# Patient Record
Sex: Female | Born: 1940 | Race: White | Hispanic: No | Smoking: Current every day smoker
Health system: Southern US, Community
[De-identification: ages and names within clinical notes are randomized; demographics above are authoritative.]

## PROBLEM LIST (undated history)

## (undated) DIAGNOSIS — M797 Fibromyalgia: Secondary | ICD-10-CM

## (undated) DIAGNOSIS — Z720 Tobacco use: Secondary | ICD-10-CM

## (undated) DIAGNOSIS — IMO0002 Reserved for concepts with insufficient information to code with codable children: Secondary | ICD-10-CM

## (undated) DIAGNOSIS — G629 Polyneuropathy, unspecified: Secondary | ICD-10-CM

## (undated) DIAGNOSIS — M329 Systemic lupus erythematosus, unspecified: Secondary | ICD-10-CM

## (undated) DIAGNOSIS — D696 Thrombocytopenia, unspecified: Secondary | ICD-10-CM

## (undated) DIAGNOSIS — Z66 Do not resuscitate: Secondary | ICD-10-CM

## (undated) DIAGNOSIS — C3490 Malignant neoplasm of unspecified part of unspecified bronchus or lung: Secondary | ICD-10-CM

## (undated) DIAGNOSIS — Z515 Encounter for palliative care: Secondary | ICD-10-CM

## (undated) DIAGNOSIS — M069 Rheumatoid arthritis, unspecified: Secondary | ICD-10-CM

## (undated) HISTORY — PX: TRIGGER FINGER RELEASE: SHX641

## (undated) HISTORY — PX: APPENDECTOMY: SHX54

## (undated) HISTORY — PX: ABDOMINAL HYSTERECTOMY: SHX81

## (undated) HISTORY — PX: KNEE ARTHROSCOPY: SUR90

## (undated) HISTORY — PX: OOPHORECTOMY: SHX86

## (undated) HISTORY — PX: OTHER SURGICAL HISTORY: SHX169

---

## 2000-05-08 ENCOUNTER — Other Ambulatory Visit: Admission: RE | Admit: 2000-05-08 | Discharge: 2000-05-08 | Payer: Self-pay | Admitting: Urology

## 2000-05-12 ENCOUNTER — Encounter: Payer: Self-pay | Admitting: Urology

## 2000-05-12 ENCOUNTER — Ambulatory Visit (HOSPITAL_COMMUNITY): Admission: RE | Admit: 2000-05-12 | Discharge: 2000-05-12 | Payer: Self-pay | Admitting: Urology

## 2000-07-19 ENCOUNTER — Inpatient Hospital Stay (HOSPITAL_COMMUNITY): Admission: RE | Admit: 2000-07-19 | Discharge: 2000-07-22 | Payer: Self-pay | Admitting: Obstetrics and Gynecology

## 2000-11-23 ENCOUNTER — Encounter (HOSPITAL_COMMUNITY): Admission: RE | Admit: 2000-11-23 | Discharge: 2000-12-23 | Payer: Self-pay | Admitting: Rheumatology

## 2000-11-23 ENCOUNTER — Encounter: Payer: Self-pay | Admitting: Rheumatology

## 2001-01-15 ENCOUNTER — Encounter (HOSPITAL_COMMUNITY): Admission: RE | Admit: 2001-01-15 | Discharge: 2001-02-14 | Payer: Self-pay | Admitting: Rheumatology

## 2001-01-15 ENCOUNTER — Encounter: Payer: Self-pay | Admitting: Rheumatology

## 2001-03-29 ENCOUNTER — Encounter: Payer: Self-pay | Admitting: Rheumatology

## 2001-03-29 ENCOUNTER — Encounter (HOSPITAL_COMMUNITY): Admission: RE | Admit: 2001-03-29 | Discharge: 2001-04-28 | Payer: Self-pay | Admitting: Rheumatology

## 2001-05-24 ENCOUNTER — Encounter (HOSPITAL_COMMUNITY): Admission: RE | Admit: 2001-05-24 | Discharge: 2001-06-23 | Payer: Self-pay | Admitting: Oncology

## 2001-05-31 ENCOUNTER — Encounter: Payer: Self-pay | Admitting: Rheumatology

## 2001-07-19 ENCOUNTER — Encounter (HOSPITAL_COMMUNITY): Admission: RE | Admit: 2001-07-19 | Discharge: 2001-08-18 | Payer: Self-pay | Admitting: Rheumatology

## 2001-08-07 ENCOUNTER — Ambulatory Visit (HOSPITAL_COMMUNITY): Admission: RE | Admit: 2001-08-07 | Discharge: 2001-08-07 | Payer: Self-pay | Admitting: Family Medicine

## 2001-08-07 ENCOUNTER — Encounter: Payer: Self-pay | Admitting: Family Medicine

## 2002-06-20 ENCOUNTER — Encounter: Payer: Self-pay | Admitting: Internal Medicine

## 2002-06-20 ENCOUNTER — Ambulatory Visit (HOSPITAL_COMMUNITY): Admission: RE | Admit: 2002-06-20 | Discharge: 2002-06-20 | Payer: Self-pay | Admitting: Internal Medicine

## 2003-06-23 ENCOUNTER — Ambulatory Visit (HOSPITAL_COMMUNITY): Admission: RE | Admit: 2003-06-23 | Discharge: 2003-06-23 | Payer: Self-pay | Admitting: Internal Medicine

## 2004-12-08 ENCOUNTER — Encounter (HOSPITAL_COMMUNITY): Admission: RE | Admit: 2004-12-08 | Discharge: 2005-01-07 | Payer: Self-pay | Admitting: Orthopedic Surgery

## 2005-03-04 ENCOUNTER — Ambulatory Visit (HOSPITAL_COMMUNITY): Admission: RE | Admit: 2005-03-04 | Discharge: 2005-03-04 | Payer: Self-pay | Admitting: Rheumatology

## 2006-02-22 ENCOUNTER — Ambulatory Visit: Payer: Self-pay | Admitting: Internal Medicine

## 2006-02-22 ENCOUNTER — Ambulatory Visit (HOSPITAL_COMMUNITY): Admission: RE | Admit: 2006-02-22 | Discharge: 2006-02-22 | Payer: Self-pay | Admitting: Internal Medicine

## 2006-06-23 ENCOUNTER — Encounter (HOSPITAL_COMMUNITY): Admission: RE | Admit: 2006-06-23 | Discharge: 2006-07-23 | Payer: Self-pay | Admitting: Rheumatology

## 2006-06-23 ENCOUNTER — Ambulatory Visit (HOSPITAL_COMMUNITY): Payer: Self-pay | Admitting: Rheumatology

## 2006-08-04 ENCOUNTER — Encounter (HOSPITAL_COMMUNITY): Admission: RE | Admit: 2006-08-04 | Discharge: 2006-09-03 | Payer: Self-pay | Admitting: Rheumatology

## 2006-10-05 ENCOUNTER — Ambulatory Visit (HOSPITAL_COMMUNITY): Payer: Self-pay | Admitting: Rheumatology

## 2006-10-05 ENCOUNTER — Encounter (HOSPITAL_COMMUNITY): Admission: RE | Admit: 2006-10-05 | Discharge: 2006-10-17 | Payer: Self-pay | Admitting: Oncology

## 2006-11-01 ENCOUNTER — Ambulatory Visit (HOSPITAL_COMMUNITY): Admission: RE | Admit: 2006-11-01 | Discharge: 2006-11-01 | Payer: Self-pay | Admitting: Internal Medicine

## 2006-11-03 ENCOUNTER — Ambulatory Visit (HOSPITAL_COMMUNITY): Admission: RE | Admit: 2006-11-03 | Discharge: 2006-11-03 | Payer: Self-pay | Admitting: Internal Medicine

## 2006-11-06 ENCOUNTER — Ambulatory Visit (HOSPITAL_COMMUNITY): Admission: RE | Admit: 2006-11-06 | Discharge: 2006-11-06 | Payer: Self-pay | Admitting: Internal Medicine

## 2006-11-30 ENCOUNTER — Ambulatory Visit (HOSPITAL_COMMUNITY): Payer: Self-pay | Admitting: Rheumatology

## 2006-11-30 ENCOUNTER — Encounter (HOSPITAL_COMMUNITY): Admission: RE | Admit: 2006-11-30 | Discharge: 2006-12-30 | Payer: Self-pay | Admitting: Rheumatology

## 2007-02-01 ENCOUNTER — Encounter (HOSPITAL_COMMUNITY): Admission: RE | Admit: 2007-02-01 | Discharge: 2007-03-03 | Payer: Self-pay | Admitting: Rheumatology

## 2007-04-05 ENCOUNTER — Ambulatory Visit (HOSPITAL_COMMUNITY): Payer: Self-pay | Admitting: Rheumatology

## 2007-04-05 ENCOUNTER — Encounter (HOSPITAL_COMMUNITY): Admission: RE | Admit: 2007-04-05 | Discharge: 2007-05-05 | Payer: Self-pay | Admitting: Oncology

## 2007-06-07 ENCOUNTER — Encounter (HOSPITAL_COMMUNITY): Admission: RE | Admit: 2007-06-07 | Discharge: 2007-07-07 | Payer: Self-pay | Admitting: Rheumatology

## 2007-06-08 ENCOUNTER — Ambulatory Visit (HOSPITAL_COMMUNITY): Payer: Self-pay | Admitting: Rheumatology

## 2007-06-11 ENCOUNTER — Ambulatory Visit (HOSPITAL_COMMUNITY): Admission: RE | Admit: 2007-06-11 | Discharge: 2007-06-11 | Payer: Self-pay | Admitting: Internal Medicine

## 2007-08-03 ENCOUNTER — Encounter (HOSPITAL_COMMUNITY): Admission: RE | Admit: 2007-08-03 | Discharge: 2007-09-02 | Payer: Self-pay | Admitting: Rheumatology

## 2007-09-27 ENCOUNTER — Ambulatory Visit (HOSPITAL_COMMUNITY): Payer: Self-pay | Admitting: Rheumatology

## 2007-09-27 ENCOUNTER — Encounter (HOSPITAL_COMMUNITY): Admission: RE | Admit: 2007-09-27 | Discharge: 2007-10-11 | Payer: Self-pay | Admitting: Rheumatology

## 2007-10-12 ENCOUNTER — Emergency Department (HOSPITAL_COMMUNITY): Admission: EM | Admit: 2007-10-12 | Discharge: 2007-10-12 | Payer: Self-pay | Admitting: Dermatopathology

## 2007-10-17 ENCOUNTER — Observation Stay (HOSPITAL_COMMUNITY): Admission: EM | Admit: 2007-10-17 | Discharge: 2007-10-18 | Payer: Self-pay | Admitting: Emergency Medicine

## 2007-11-22 ENCOUNTER — Encounter (HOSPITAL_COMMUNITY): Admission: RE | Admit: 2007-11-22 | Discharge: 2007-12-22 | Payer: Self-pay | Admitting: Rheumatology

## 2007-11-22 ENCOUNTER — Ambulatory Visit (HOSPITAL_COMMUNITY): Payer: Self-pay | Admitting: Rheumatology

## 2008-01-18 HISTORY — PX: CATARACT EXTRACTION W/ INTRAOCULAR LENS IMPLANT: SHX1309

## 2008-01-30 ENCOUNTER — Ambulatory Visit (HOSPITAL_COMMUNITY): Payer: Self-pay | Admitting: Rheumatology

## 2008-01-30 ENCOUNTER — Encounter (HOSPITAL_COMMUNITY): Admission: RE | Admit: 2008-01-30 | Discharge: 2008-02-29 | Payer: Self-pay | Admitting: Oncology

## 2008-03-14 ENCOUNTER — Ambulatory Visit (HOSPITAL_COMMUNITY): Admission: RE | Admit: 2008-03-14 | Discharge: 2008-03-14 | Payer: Self-pay | Admitting: Family Medicine

## 2008-04-04 ENCOUNTER — Ambulatory Visit (HOSPITAL_COMMUNITY): Payer: Self-pay | Admitting: Rheumatology

## 2008-04-04 ENCOUNTER — Encounter (HOSPITAL_COMMUNITY): Admission: RE | Admit: 2008-04-04 | Discharge: 2008-05-04 | Payer: Self-pay | Admitting: Rheumatology

## 2008-05-30 ENCOUNTER — Ambulatory Visit (HOSPITAL_COMMUNITY): Payer: Self-pay | Admitting: Rheumatology

## 2008-05-30 ENCOUNTER — Encounter (HOSPITAL_COMMUNITY): Admission: RE | Admit: 2008-05-30 | Discharge: 2008-06-29 | Payer: Self-pay | Admitting: Rheumatology

## 2008-07-11 ENCOUNTER — Encounter (HOSPITAL_COMMUNITY): Admission: RE | Admit: 2008-07-11 | Discharge: 2008-08-10 | Payer: Self-pay | Admitting: Rheumatology

## 2008-08-27 ENCOUNTER — Ambulatory Visit (HOSPITAL_COMMUNITY): Admission: RE | Admit: 2008-08-27 | Discharge: 2008-08-27 | Payer: Self-pay | Admitting: Family Medicine

## 2008-09-15 ENCOUNTER — Emergency Department (HOSPITAL_COMMUNITY): Admission: EM | Admit: 2008-09-15 | Discharge: 2008-09-15 | Payer: Self-pay | Admitting: Emergency Medicine

## 2009-02-15 ENCOUNTER — Encounter (HOSPITAL_COMMUNITY)
Admission: RE | Admit: 2009-02-15 | Discharge: 2009-12-18 | Payer: Self-pay | Source: Home / Self Care | Admitting: Rheumatology

## 2009-03-27 ENCOUNTER — Ambulatory Visit (HOSPITAL_COMMUNITY)
Admission: RE | Admit: 2009-03-27 | Discharge: 2009-03-27 | Payer: Self-pay | Source: Home / Self Care | Admitting: Internal Medicine

## 2009-06-09 ENCOUNTER — Ambulatory Visit (HOSPITAL_COMMUNITY): Payer: Self-pay | Admitting: Rheumatology

## 2009-06-09 ENCOUNTER — Encounter (HOSPITAL_COMMUNITY): Admission: RE | Admit: 2009-06-09 | Discharge: 2009-07-09 | Payer: Self-pay | Admitting: Rheumatology

## 2009-09-02 ENCOUNTER — Encounter (HOSPITAL_COMMUNITY)
Admission: RE | Admit: 2009-09-02 | Discharge: 2009-10-02 | Payer: Self-pay | Source: Home / Self Care | Admitting: Rheumatology

## 2009-09-02 ENCOUNTER — Ambulatory Visit (HOSPITAL_COMMUNITY): Payer: Self-pay | Admitting: Rheumatology

## 2009-09-10 ENCOUNTER — Ambulatory Visit (HOSPITAL_COMMUNITY): Admission: RE | Admit: 2009-09-10 | Discharge: 2009-09-10 | Payer: Self-pay | Admitting: Internal Medicine

## 2009-10-21 ENCOUNTER — Encounter (HOSPITAL_COMMUNITY)
Admission: RE | Admit: 2009-10-21 | Discharge: 2009-11-20 | Payer: Self-pay | Source: Home / Self Care | Admitting: Rheumatology

## 2009-10-21 ENCOUNTER — Ambulatory Visit (HOSPITAL_COMMUNITY): Payer: Self-pay | Admitting: Rheumatology

## 2009-12-16 ENCOUNTER — Ambulatory Visit (HOSPITAL_COMMUNITY): Payer: Self-pay | Admitting: Rheumatology

## 2010-02-02 ENCOUNTER — Ambulatory Visit (HOSPITAL_COMMUNITY)
Admission: RE | Admit: 2010-02-02 | Discharge: 2010-02-02 | Payer: Self-pay | Source: Home / Self Care | Attending: Internal Medicine | Admitting: Internal Medicine

## 2010-02-10 ENCOUNTER — Encounter (HOSPITAL_COMMUNITY): Admission: RE | Admit: 2010-02-10 | Payer: Self-pay | Source: Home / Self Care | Admitting: Rheumatology

## 2010-02-18 ENCOUNTER — Ambulatory Visit (HOSPITAL_COMMUNITY): Admission: RE | Admit: 2010-02-18 | Payer: Self-pay | Source: Ambulatory Visit | Admitting: Oncology

## 2010-02-18 ENCOUNTER — Ambulatory Visit (HOSPITAL_COMMUNITY): Payer: Medicare Other | Attending: Oncology

## 2010-02-18 ENCOUNTER — Ambulatory Visit (HOSPITAL_COMMUNITY): Payer: Medicare Other

## 2010-02-18 DIAGNOSIS — M069 Rheumatoid arthritis, unspecified: Secondary | ICD-10-CM | POA: Insufficient documentation

## 2010-03-28 ENCOUNTER — Emergency Department (HOSPITAL_COMMUNITY): Payer: Medicare Other

## 2010-03-28 ENCOUNTER — Emergency Department (HOSPITAL_COMMUNITY)
Admission: EM | Admit: 2010-03-28 | Discharge: 2010-03-29 | Disposition: A | Discharge status: Home / Self Care | Payer: Medicare Other | Attending: Emergency Medicine | Admitting: Emergency Medicine

## 2010-03-28 DIAGNOSIS — I498 Other specified cardiac arrhythmias: Secondary | ICD-10-CM | POA: Insufficient documentation

## 2010-03-28 DIAGNOSIS — J449 Chronic obstructive pulmonary disease, unspecified: Secondary | ICD-10-CM | POA: Insufficient documentation

## 2010-03-28 DIAGNOSIS — E119 Type 2 diabetes mellitus without complications: Secondary | ICD-10-CM | POA: Insufficient documentation

## 2010-03-28 DIAGNOSIS — M329 Systemic lupus erythematosus, unspecified: Secondary | ICD-10-CM | POA: Insufficient documentation

## 2010-03-28 DIAGNOSIS — R0609 Other forms of dyspnea: Secondary | ICD-10-CM | POA: Insufficient documentation

## 2010-03-28 DIAGNOSIS — J4489 Other specified chronic obstructive pulmonary disease: Secondary | ICD-10-CM | POA: Insufficient documentation

## 2010-03-28 DIAGNOSIS — R0989 Other specified symptoms and signs involving the circulatory and respiratory systems: Secondary | ICD-10-CM | POA: Insufficient documentation

## 2010-03-28 DIAGNOSIS — I10 Essential (primary) hypertension: Secondary | ICD-10-CM | POA: Insufficient documentation

## 2010-03-28 LAB — BASIC METABOLIC PANEL
GFR calc Af Amer: >60 mL/min (ref >60)
GFR calc non Af Amer: 56 mL/min — ABNORMAL LOW (ref >60)
Sodium: 133 mEq/L — ABNORMAL LOW (ref 135–145)

## 2010-03-28 LAB — DIFFERENTIAL
Basophils Absolute: 0.1 10*3/uL (ref 0.0–0.1)
Basophils Relative: 1 % (ref 0–1)
Eosinophils Relative: 1 % (ref 0–5)
Monocytes Relative: 9 % (ref 3–12)
Neutro Abs: 5.7 10*3/uL (ref 1.7–7.7)
Neutrophils Relative %: 54 % (ref 43–77)

## 2010-03-28 LAB — CK TOTAL AND CKMB (NOT AT ARMC)
CK, MB: 1.1 ng/mL (ref 0.3–4.0)
Relative Index: INVALID (ref 0.0–2.5)

## 2010-03-28 LAB — POCT CARDIAC MARKERS: Troponin i, poc: <0.05 ng/mL (ref 0.00–0.09)

## 2010-03-28 LAB — TROPONIN I: Troponin I: 0.01 ng/mL (ref 0.00–0.06)

## 2010-03-28 LAB — CBC
HCT: 38.6 % (ref 36.0–46.0)
MCH: 29.9 pg (ref 26.0–34.0)
MCHC: 31.9 g/dL (ref 30.0–36.0)
MCV: 93.9 fL (ref 78.0–100.0)
RBC: 4.11 MIL/uL (ref 3.87–5.11)

## 2010-04-22 ENCOUNTER — Encounter (HOSPITAL_COMMUNITY): Payer: Medicare Other | Attending: Rheumatology

## 2010-04-22 DIAGNOSIS — M069 Rheumatoid arthritis, unspecified: Secondary | ICD-10-CM | POA: Insufficient documentation

## 2010-04-24 LAB — DIFFERENTIAL
Lymphocytes Relative: 13 % (ref 12–46)
Lymphs Abs: 1.5 10*3/uL (ref 0.7–4.0)
Neutrophils Relative %: 80 % — ABNORMAL HIGH (ref 43–77)

## 2010-04-24 LAB — CBC
Platelets: 165 10*3/uL (ref 150–400)
WBC: 12 10*3/uL — ABNORMAL HIGH (ref 4.0–10.5)

## 2010-04-24 LAB — BASIC METABOLIC PANEL
BUN: 14 mg/dL (ref 6–23)
Calcium: 9.6 mg/dL (ref 8.4–10.5)
Creatinine, Ser: 0.78 mg/dL (ref 0.4–1.2)
GFR calc non Af Amer: >60 mL/min (ref >60)

## 2010-06-01 NOTE — H&P (Signed)
[name], [name]              ACCOUNT NO.:  [id number]   MEDICAL RECORD NO.:  [id number]          PATIENT TYPE:  EMS   LOCATION:  ED                            FACILITY:  [facility]   PHYSICIAN:  Skeet Latch, DO    DATE OF BIRTH:  November 27, 1940   DATE OF ADMISSION:  10/17/2007  DATE OF DISCHARGE:  LH                              HISTORY & PHYSICAL   PRIMARY CARE PHYSICIAN:  Madelin Rear. Sherwood Gambler, MD.   CHIEF COMPLAINT:  Cough and headache.   HISTORY OF PRESENT ILLNESS:  This is a 70 year old Caucasian female who  presents with a complaint of headache and cough.  The patient was in the  emergency room approximately 5 days ago with cough, myalgia and headache  and was sent home with a diagnosis of upper respiratory tract infection.  The patient states that she went to her primary care physician's office  a few days prior and was placed on antibiotic and did not get any  relief.  The patient again presented to the primary care physician's  office today with same complaints of cough with headache and overall  achiness and was sent to the emergency room.  The patient states her  headache is more severe when she coughs.  It is gradual in onset and it  waxes and wanes.  It is persistent.  The headache is the bilateral side  of her head and back of her head.  The patient states that her cough is  not productive, has some slight nausea but no vomiting.  Denies any  chest pain, abdominal pain or genitourinary complaints.   PAST MEDICAL HISTORY:  Includes hypertension, type 2 diabetes,  fibromyalgia, hypercholesterolemia, lupus, neuropathy and rheumatoid  arthritis.   PAST SURGICAL HISTORY:  Positive for cesarean section, hysterectomy,  knee/hand/foot surgery.   SOCIAL HISTORY:  She is a smoker.  No history of drug abuse or  alcoholism.   PAST FAMILY HISTORY:  Is unremarkable.   ALLERGIES:  SOME TYPE OF MYCIN.   HOME MEDICATIONS:  1. Include Remicade IV.  2. Glyburide unknown dose and  frequency.  3. Metformin unknown dose and frequency.  4. Lisinopril/HCTZ unknown dose and frequency.  5. Triplex DM unknown dose and frequency.  6. Gabapentin unknown dose and frequency.  7. Levaquin unknown dose and frequency.  8. Albuterol tab unknown dose and frequency.  9. Methotrexate unknown dose once a week on Tuesdays.   PHYSICAL EXAMINATION:  GENERAL:  She looks uncomfortable.  She is well-  developed, well-nourished and well-hydrated in no acute distress.  She  is awake and alert.  HEENT:  Head is atraumatic, normocephalic.  No scleral icterus.  PERRLA.  EOMI.  NECK:  Neck is soft, supple, nontender, nondistended.  CARDIOVASCULAR:  Regular rate and rhythm.  No murmurs, rubs or gallops.  LUNGS:  Clear to auscultation bilaterally.  No rales, rhonchi or  wheezing.  ABDOMEN:  Soft, nontender, nondistended.  Bowel sounds.  No guarding,  rigidity or guarding.  EXTREMITIES:  No clubbing, cyanosis or edema.  NEUROLOGICAL:  Cranial nerves II-XII were grossly intact.  The patient  has good sensation and good movement.  Good coordination.   LABS:  White count of 8.2, hemoglobin 13.7, hematocrit 41.1, platelet  count is 192,000, 80 neutrophils, 15 lymphocytes.  Sodium 138, potassium  4.1, chloride 99, CO2 29, glucose 238, BUN 22, creatinine 0.95, calcium  8.9.   RADIOLOGIC STUDIES:  Chest x-ray showed mildly increased bibasilar  atelectasis without consolidation or edema.  CT of her head without  contrast showed no acute intracranial findings.  Postsurgical changes  status post right partial mastectomy.  There is partial opacification of  the mini mastoid air cells in the right suspicious for inflammation.   ASSESSMENT:  1. Acute bronchitis.  2. Headache.  3. History of type 2 diabetes.  4. History of fibromyalgia.  5. History of lupus.  6. History of hyperlipidemia.  7. History of hypertension.  8. History of rheumatoid arthritis.   PLAN:  1. The patient will be admitted  to observation to general medical bed.  2. For her bronchitis we will place the patient on IV antibiotics at      this time as well as nebulizer treatments.  The patient does not      have any history of fever at this time.  Influenza A or B and H1N1      is a consideration but secondary to the patient's history of lupus      and fibromyalgia and rheumatoid arthritis the patient has chronic      myalgias.  The patient denies any sore throat, colds.  Her only      symptoms seem to be cough with headache and as stated her muscle      aches are chronic in nature.  Secondary to this the patient will be      on contact precautions.  Do not feel that she needs to be isolated      unless her symptoms change.  3. For her headache as mentioned her CT scan was unremarkable.  Will      place the patient on pain medication as needed for any headache.  4. For her history of type 2 diabetes the patient will be on a      moderate sliding scale with one night coverage.  We will add A1c to      her a.m. labs at this time.  5. For her history of fibromyalgia and rheumatoid arthritis we will      place the patient on her home medications once we get the dose and      frequency, either from her pharmacy or her primary care physician.  6. Lastly will continue to IV hydrate the patient and encourage oral      intake of fluids.  7. Will increase her diet to an ADA diet.  8. Lastly, the patient will be placed on DVT as well as GI      prophylaxis.      Skeet Latch, DO  Electronically Signed     SM/MEDQ  D:  10/17/2007  T:  10/17/2007  Job:  [id number]   cc:   Madelin Rear. Sherwood Gambler, MD  Fax: [phone]

## 2010-06-01 NOTE — Discharge Summary (Signed)
[name], [name]              ACCOUNT NO.:  [id number]   MEDICAL RECORD NO.:  [id number]          PATIENT TYPE:  OBV   LOCATION:  A318                          FACILITY:  [facility]   PHYSICIAN:  Skeet Latch, DO    DATE OF BIRTH:  30-Jun-1940   DATE OF ADMISSION:  10/17/2007  DATE OF DISCHARGE:  10/01/2009LH                               DISCHARGE SUMMARY   PRIMARY CARE PHYSICIAN:  Kathy Rear. Sherwood Gambler, MD   DISCHARGE DIAGNOSES:  1. Acute bronchitis.  2. Headache, resolved.  3. Type 2 diabetes.  4. Fibromyalgia.  5. Lupus.  6. Hyperlipidemia.  7. Hypertension.  8. History of rheumatoid arthritis.   BRIEF HOSPITAL COURSE:  This is a 70 year old Caucasian female who  presented with complaint of headache and cough.  The patient presented  to the emergency room approximately 5 days prior with cough, myalgia and  headache and was sent home with diagnosis of upper respiratory tract  infection.  The patient then went to her primary care physician's office  few days later, was placed on antibiotics, did not get any relief.  The  patient went again to her primary care physician's office with same  complaint of cough and headache and achiness, was then sent to the  emergency room.  The patient described the headache as severe associated  with cough and states that it waxed and waned.  Stated it was  persistent, described it as bilateral.  The pain was bilateral and in  the back of her head.  The patient, previously the cough was  nonproductive but now is productive.  Had a slight nausea, but no  vomiting.  Denied any chest pain, abdominal pain or genitourinary  complaints.  The patient was seen in the emergency room and evaluated.  Her chest x-ray showed mildly increased bibasilar atelectasis without  consolidation or edema.  She had a CT of her head that showed no acute  intracranial findings.  Showed post surgical changes status post right  partial mastoidectomy and there was partial  opacification of the  remaining mastoid air cells on the right suspicious for inflammation.  The patient was admitted for acute bronchitis and headache.  The patient  was placed on IV antibiotics as well as nebulizing treatments.  There  was slight suspicion of influenza A or B or H1N1, but the patient did  not have a fever and her history of myalgia with lupus and rheumatoid  arthritis is very difficult because she has chronic myalgias.  Today,  the patient states that her headache has resolved.  Her cough is  improving, it is not productive and the patient is stable enough to be  discharged to home.   MEDICATIONS ON DISCHARGE:  1. Remicade IV every 8 weeks.  2. Glyburide and metformin 5/500 mg twice a day.  3. Folic acid 1 mg p.o. daily.  4. Lisinopril / hydrochlorothiazide 10/12.5 mg daily.  5. Prednisone 3 mg daily.  6. She will resume her previous methotrexate dose every weekly.  7. Triplex DM.  8. Resume previous dose of gabapentin also twice a day.  9.  Levaquin 750 mg p.o. daily at 7 days.  10.Mucinex 600 mg twice a day p.r.n.  11.The patient to get over-the-counter cough suppressant for cough.   VITALS SIGNS:  On discharge; temperature is 97.6, respirations 18, heart  rate 95, and blood pressure is 133/61.   LABORATORY DATA:  Hemoglobin 12.6, hematocrit 37.4, white count 6.0, and  platelets 175,000.  Sodium 136, potassium 4.3, chloride is 102, CO2 is  27, BUN 14, creatinine 0.87, and glucose is 106.   Her chest x-ray showed mildly increased bibasilar atelectasis without  consolidations or edema.   CONDITION ON DISCHARGE:  Stable.   DISPOSITION:  The patient is to be discharged home.   DISCHARGE INSTRUCTIONS:  The patient is to maintain 1800-ADA diet.  She  is to increase her activity as previous.  The patient is to follow up  with Dr. Sherwood Watson in the next 2-3 days.  The patient is to take medications  as directed.  The patient is instructed to cover her mouth and nose  by  her arm-sleeve or with a mask if she continues to have coughing around  other people.  The patient also instructed if her cough becomes severe  or if she begins to have more severe headache, to return to emergency  room and to call her primary care physician.      Skeet Latch, DO  Electronically Signed     SM/MEDQ  D:  10/18/2007  T:  10/19/2007  Job:  [id number]   cc:   Kathy Rear. Sherwood Gambler, MD  Fax: [phone]

## 2010-06-04 NOTE — Consult Note (Signed)
[facility]  Patient:    [name], [name] Visit Number: [id number] MRN: [id number]          Service Type: RHE Location: [facility] Attending Physician:  Aundra Dubin Dictated by:   Aundra Dubin, M.D. Proc. Date: 01/18/01 Admit Date:  01/15/2001   CC:         Elfredia Nevins, M.D.   Consultation Report  CHIEF COMPLAINT:  Left knee bakers cyst, history of rheumatoid arthritis.  HISTORY OF PRESENT ILLNESS:  Ms. Starzyk is still hurting in the left knee, but this was made worse when she fell on both knees two weeks before Christmas. Overall, she feels the tightness and pain to the left calf and around the knee has improved.  She increased her prednisone to 20 mg for 1 to 2 days recently because of hurting in the wrist.  She had a chest x-ray on January 15, 2001. There were chronic bronchitic changes with no acute abnormality.  This was compared to July 01, 1999.  Her weight is stable, and she is down 1 pound. There has been no change in her cough and degree of shortness of breath.  MEDICATIONS:  1. Prednisone 10 mg q.d.  2. Accolate b.i.d.  3. Lotrel 20 mg q.d.  4. Folic acid 1 mg q.d.  5. Kay-Ciel 10 mEq q.d.  6. HCTZ 25 mg q.d.  7. Actonel 5 mg q.d.  8. Nasonex q.d.  9. Vicodin 3 or 4 q.d. 10. Lasix 20 mg p.r.n.  PHYSICAL EXAMINATION:  VITAL SIGNS:  Weight 190 pounds.  Blood pressure 150/80, respirations 18.  GENERAL:  No distress.  SKIN:  Rare bruise to the forearm.  HEENT:  Face has steroid fullness.  NECK:  Negative JVD.  LUNGS:  Slight bronchitic sounds.  HEART:  Regular.  MUSCULOSKELETAL:  There is fullness to the distal forearms and around the wrists which are tender.  The MCPs appear puffy, but they are not tender and are soft.  Trigger points at the elbows, shoulders, neck, occiput, anterior chest, and upper paraspinous muscles are tender.  The left knee has significant joint line tenderness.  There is little  tenderness with flexion to 130 degrees.  The calf and swelling behind the knee is reduced from two months ago.  She is still moderately tender.  The ankles and feet at not swollen and nontender.  Lower extremities have trace edema.  PROCEDURE:  Left bakers cyst injection.  DESCRIPTION OF PROCEDURE:  The skin was clean with Betadine and alcohol swabs and cooled with ethyl chloride; 80 mg of Depo-Medrol and 1 cc of 2% lidocaine were injected.  ASSESSMENT/PLAN: 1. Left bakers cyst.  In addition to the bakers cyst, she may have incurred    some trauma to the knee.  She has now the medial joint line tenderness.  We    have tried another injection which is now #3. 2. History of rheumatoid arthritis.  Although her hands appear puffy at the    forearms, wrists, and metacarpophalangeal joints, I am still not fully    convinced that this is rheumatoid arthritis and would still hold off on    continuing her on methotrexate or other rheumatoid-arthritis-remitting    medicine.  She asked to come off of the prednisone.  I have encouraged her    to stay essentially at 10 mg a day with occasional 5 mg until we get past    the winter, and we will try to take her off. 3.  Long-term smoking.  She will return in two months. Dictated by:   Aundra Dubin, M.D. Attending Physician:  Aundra Dubin DD:  01/18/01 TD:  01/18/01 Job: [id number]

## 2010-06-04 NOTE — Consult Note (Signed)
[facility]  Patient:    [name], [name] Visit Number: [id number] MRN: [id number]          Service Type: RHE Location: [facility] Attending Physician:  Aundra Dubin Dictated by:   Nathaneil Canary, M.D. Proc. Date: 05/24/01 Admit Date:  05/24/2001   CC:         Butch Penny, M.D.   Consultation Report  CHIEF COMPLAINT: 1. Left Bakers cyst. 2. Polyarthralgia. 3. Possible history of rheumatoid arthritis.  BRIEF HISTORY:  The patient returns reporting that her overall level of pain is fairly good at this point.  She was evaluated by Dr. Plumeritis who felt that surgery was not fully indicated.  He suggested to continue with the every few month injection to the knee.  She is aching a great deal in the knee and the calf is swollen and very tender.  She is having difficulty walking.  Her weight is generally stable.  She is down 4 pounds.  She is trying to lose some weight.  She has been given some medicine to help with sleep and is feeling some better.  She has had adjustments to her thyroid medications.  MEDICATIONS: 1. Prednisone 10 mg q. day. 2. Premarin q. day. 3. Lotrel 20 mg q. day. 4. Actonel 35 mg q. week. 5. Vicodin p.r.n. 6. Singulair q. day. 7. Clarinex 5 mg q. day. 8. Hydrocodone p.r.n.  PHYSICAL EXAMINATION:  VITAL SIGNS:  Weight 182 pounds.  Blood pressure 160/80.  Respirations 16.  GENERAL:  No distress.  LUNGS:  Fairly clear and there is no wheeze or rhonchi.  NECK:  Negative JVD.  LOWER EXTREMITIES:  No edema.  MUSCULOSKELETAL:  Her MCPs and PIPs have puffiness but are cool and nontender. The wrists remain swollen.  There is no warm.  They are slightly to moderately tender.  Elbows and shoulders have good range of motion.  The left knee is very tender, is warm, and has a trace effusion.  The left calf is swollen, and is fairly hard and tender.  Her gait is antalgic favoring the left extremity.  PROCEDURE:  Left Bakerss  cyst injection.  The skin was cleaned with Betadine and alcohol swabs and cooled with ethyl chloride.  Then 120 mg of Depo-Medrol and 1 cc. of 2% lidocaine was injected.  ASSESSMENT AND PLAN: 1. Left Bakers cyst.  We will see how she does with this injection.  My sense    is that I have not fully been effective in treating this with the    injections.  I do want her to go for further evaluation with ultrasound to    see if this is improved or not.  This will also rule out a DVT. 2. Polyarthralgias.  She is improved with some medicines with sleep.  We are    not clear what they are. 3. History of rheumatoid arthritis.  I did review her x-rays from March 29, 2001.  These show mild joint space narrowing across the PIPs but the MCPs    and wrist are well maintained.  There is no osteopenia.  The plan is to    have her reduce the prednisone to 7-1/2 mg q. day until June 17, 2001, and    then 5 mg q. day. 4. Long term smoking.  Strategies to quit were again discussed.  FOLLOWUP:  She will return in 2 months. Dictated by:   Nathaneil Canary, M.D. Attending Physician:  Aundra Dubin DD:  05/24/01 TD:  05/25/01 Job: [id number]

## 2010-06-04 NOTE — Consult Note (Signed)
[facility]  Patient:    [name], [name] Visit Number: [id number] MRN: [id number]          Service Type: RHE Location: [facility] Attending Physician:  Aundra Dubin Dictated by:   Nathaneil Canary, M.D. Proc. Date: 03/29/01 Admit Date:  03/29/2001                            Consultation Report  CHIEF COMPLAINT:  Left Bakers cyst, history of RA.  HISTORY:  [name] did further improve some after the third left knee injection for the Bakers cyst.  However, over the last few weeks this has worsened and she is hurting a great deal in the knee.  She is having difficulty walking on it.  Also, her hands and wrists seem to be aching.  She says they ache about 7/10.  Her weight is down 3 pounds.  She is having no worsened cough or her usual shortness of breath.  Shoulders, back, right knee, ankles, and feet do not particularly hurt.  MEDICATIONS: 1. Prednisone 10 mg q.d. 2. Actonel 5 mg q.d. 3. Accolate 50 mg. 4. Lotrel 20 mg q.d. 5. Nasonex. 6. Vicodin p.r.n. 7. Singulair q.d.  PHYSICAL EXAMINATION  VITAL SIGNS:  Weight 187 pounds, blood pressure 140/80, respirations 16.  GENERAL:  She is having a chill and she is wrapped up in two sheets and a blanket.  LUNGS:  Basically clear.  HEART:  Regular.  MUSCULOSKELETAL:  The hands including the MCPs, PIPs, and DIPs are nonswollen and nontender.  At the wrist and extending further to the forearms, she is warm and has swelling there.  These areas are tender.  This is generally the wrist but it also includes the forearm which is unusual.  Elbows, shoulders: Good range of motion.  Back has moderate tenderness.  The right knee is cool. The left knee is swollen and warm.  She is quite tender with palpation in the posterior knee and upper calf.  Ankles and feet were nontender.  ASSESSMENT AND PLAN: 1. Left knee Bakers cyst.  We have discussed who she would want to see as an    orthopedist and she would  like to see Dr. Arletha Grippe. We are setting this    appointment up for her.  I have given the knee three injections over about    two months.  At this point it is not improving and she will need orthopedic    help. 2. History of rheumatoid arthritis.  The wrist and more the forearms appear    swollen.  I am having her go for x-rays of the hands.  We are giving her an    injection of 120 mg of Depo-Medrol today.  She will otherwise continue on    10 mg of prednisone.  I may want to repeat a bone scan.  I believe she had    one in the past but I could not find it in the notes as to when this was. 3. Long history of smoking.  The chest x-ray in December, again, showed only    bronchitis changes.  She will return in two months. Dictated by:   Nathaneil Canary, M.D. Attending Physician:  Aundra Dubin DD:  03/29/01 TD:  03/30/01 Job: [id number]

## 2010-06-04 NOTE — Consult Note (Signed)
[facility]  Patient:    [name], [name] Visit Number: [id number] MRN: [id number]          Service Type: RHE Location: [facility] Attending Physician:  Aundra Dubin Dictated by:   Aundra Dubin, M.D. Proc. Date: 11/23/00 Admit Date:  11/23/2000   CC:         [name], M.D.   Consultation Report  CHIEF COMPLAINT:  Swollen left calf, history of arthritis.  HISTORY OF PRESENT ILLNESS:  I last saw [name] in my [place] office on May 03, 2000.  This was at a point where I had worked with her for about one year for what was felt to be rheumatoid arthritis, but I had started doubting the diagnosis over a number of months.  She was taking 10 mg of prednisone at that time and was off all other RA remitting medicines.  At that time, she was found to have an ovarian mass and underwent surgery.  I do not know the pathology results, but I suspect that this was not cancer.  She says her weight is up at this time.  At this time, her left knee and calf are the worst areas.  These have been swollen for about three weeks.  It is quite painful to walk.  Her hands and wrists hurt some, but her shoulders and upper back hurt more.  Her weight is up.  There have been no recent rashes.  She rarely has headaches.  She is trying to cut down on smoking.  There has been no fever, recent URIs.  She does have some cough secondary to her smoking.  She denies any significant shortness of breath.  PAST MEDICAL/SURGICAL HISTORY:  I saw her on November 26, 1997, because of a positive rheumatoid factor.  At that time, I did not feel that she had rheumatoid arthritis.  As I started working with her in 2001, I did not realize that I had seen her before.  Her hands were x-rayed which I have reviewed which showed minor degenerative-type changes.  History of thyroid nodule, hysterectomy, history of left arm and hand surgery, hysterectomy, episodes of tachycardia,  recent ovarian mass and surgery.  Her weight in 1999 was 180 pounds.  MEDICINES:  1. Accolate 50 mg b.i.d.  2. Premarin 0.625 mg q.d.  3. Lotrel 20 mg q.d.  4. Folic acid 1 mg q.d.  5. Prednisone 5 mg b.i.d.  6. Kay Ciel 20 mEq q.d.  7. HCTZ 25 mg q.d.  8. Actonel 5 mg q.d.  9. Nasonex 50 mcg p.r.n. 10. Vicodin 5/500 p.r.n. 11. Lasix 20 mg b.i.d. p.r.n.  DRUG INTOLERANCES:  None.  FAMILY HISTORY:  Her father committed suicide.  Her mother does not have arthritis.  SOCIAL HISTORY:  She is here today with her second husband.  She has three grown children.  She began smoking at about age 46 and, now, smokes about a half a pack a day.  She generally had been smoking two packs of cigarettes a day.  No alcohol.  PHYSICAL EXAMINATION:  VITAL SIGNS:  Weight 191 pounds, blood pressure 140/70, respirations 18.  SKIN:  She has some slight bruising to the forearms.  Face shows moderate steroid fullness.  HEENT:  PERRL/EOMI.  Mouth clear, but she has dentures.  NECK:  Negative JVD with fullness of the upper cervical area.  LUNGS:  Quiet sounds with slight rhonchi.  HEART:  Regular.  No murmur.  ABDOMEN:  Negative HSM, nontender.  MUSCULOSKELETAL:  Hands appear to be puffy as I have seen before.  There was slight tenderness across the MCPs.  Wrists move well without resistance or guarding, elbows extended fully and had no nodules.  Shoulders had good range of motion with mild stiffness.  Trigger points around the shoulder, neck, occiput, anterior chest, and all along the paraspinous muscles were tender. The right knee was cool and flexed easily.  The left knee was somewhat swollen, warm.  More remarkable is the swelling to the entire left calf.  She measures 38.5 10 cm below the inferior patella and, on the right leg, this is 36.1.  Ankles and feet were nonswollen and nontender.  Neurologic nonfocal.  ASSESSMENT AND PLAN:  Left calf swelling.  We had her go for an  ultrasound which revealed no deep venous thrombosis.  I spoke with Dr. Tyron Russell concerning the finding which showed a large bilobed collection of fluid extending down from the popliteal fossa.  These measured 9 x 3 x 2.2 cm.  He assured me this was not a deep venous thrombosis and most likely corresponded to a variant Bakers cyst.  PROCEDURE:  The left knee was cleaned with Betadine and alcohol swab and cooled with ethyl chloride.  Using 21 gauge needle, only 8 cc of moderately hazy, nonbloody fluid was removed.  Depo-Medrol 120 mg and 1 cc of 2% lidocaine was injected.  ASSESSMENT AND PLAN:  1. Suspected complicated Bakers cyst.  As I have discussed with [name],     we may inject this a couple times and follow her course.  If this does not     seem to be resolving readily and rapidly, then I would refer her to an     orthopedist.  She has her Vicodin at home to use for pain.  At the present     time, I would not increase the prednisone.  2. Rheumatoid arthritis.  I also reviewed the bone scans that she has.  There     is some mild activity in the wrists, but this was felt to be degenerative     in nature.  At the present time, I do not think that she has rheumatoid     arthritis.  3. Long-term smoking.  Within one or two visits at this point, I would like     to recheck an X-ray.  4. Osteoporosis issues.  She will continue with the Actonel.  She will return in one week. Dictated by:   Aundra Dubin, M.D. Attending Physician:  Aundra Dubin DD:  11/23/00 TD:  11/24/00 Job: [id number]

## 2010-06-04 NOTE — Consult Note (Signed)
[facility]  Patient:    [name], [name] Visit Number: [id number] MRN: [id number]          Service Type: RHE Location: [facility] Attending Physician:  Aundra Dubin Dictated by:   Aundra Dubin, M.D. Proc. Date: 11/30/00 Admit Date:  11/23/2000   CC:         Elfredia Nevins, M.D.   Consultation Report  CHIEF COMPLAINT:  Left knee bakers cyst.  HISTORY OF PRESENT ILLNESS:  Ms. Kercheval returns reporting that she is feeling considerably better, and the swelling and aching has reduced a great deal. She states that she aches in her hands, wrists, and knees when she lowers the prednisone below 10 mg a day.  PHYSICAL EXAMINATION:  VITAL SIGNS:  Weight 191 pounds.  Blood pressure 132/64, respirations 18.  MUSCULOSKELETAL:  Hands and wrists appear puffy and have mild tenderness as I have seen many times before.  Elbows and shoulders have good range of motion. The right knee is cool and nontender and flexes to 130 degrees.  The left knee is less warm, less swollen and significantly less tender  She has a good range of motion that is mildly tender with flexion at 130 degrees.  The left calf is less swollen but still mildly to moderately tender.  PROCEDURE:  Left bakers cyst injection.  The skin was cleaned with Betadine and alcohol swabs and cooled with ethyl chloride; 120 mg Depo-Medrol and 1 cc of 2% lidocaine was injected.  ASSESSMENT/PLAN: 1. Left bakers cyst.  Hopefully with the two injections, one week apart,    this will be more fully resolved.  She is significantly improved at this    time. 2. History of rheumatoid arthritis.  At the present time, I still do not    believe that this is rheumatoid arthritis.  She does have a great deal    of achiness, and the prednisone seems to help this.  I believe there is    some element of fibromyalgia in addition. 3. History of long-term smoking.  Within the next weeks, she will go for    a chest  x-ray to rule out any masses.  I will see her back in six weeks. Dictated by:   Aundra Dubin, M.D. Attending Physician:  Aundra Dubin DD:  11/30/00 TD:  11/30/00 Job: [id number]

## 2010-06-04 NOTE — Consult Note (Signed)
[facility]  Patient:    [name], [name] Visit Number: [id number] MRN: [id number]          Service Type: RHE Location: [facility] Attending Physician:  Aundra Dubin Dictated by:   Aundra Dubin, M.D. Proc. Date: 07/19/01 Admit Date:  07/19/2001   CC:         Elfredia Nevins, M.D.   Consultation Report  CHIEF COMPLAINT: 1. Left Bakers cyst 2. Polyarthralgia. 3. History of rheumatoid arthritis.  BRIEF HISTORY:  The patient has needed to be on a slightly higher dose of prednisone recently because of sinusitis.  She is finishing a 2 week course of antibiotics and the prednisone has been lowered back to 7.5 mg a day.  She hurts in her wrist.  The left knee, she feels is considerably better.   It does have some catching occasionally.  There has been no polyuria or polydipsia.  Her weight is stable.  She denies back pain.  She has some mild shortness of breath secondary to long-term smoking.  There has been no rashes, fevers, or headaches.  MEDICATIONS: 1. Lotrel 5/20 mg q. day. 2. Premarin 0.625 mg q. day. 3. Prednisone 7.5 mg q. day. 4. Actonel 35 mg q. week. 5. Vicodin p.r.n. 6. Singulair q. day. 7. Albuterol q.4h. 8. Triamterene 25/37.5 mg q. day. 9. Lexapro 10 mg q. day.  PHYSICAL EXAMINATION:  VITAL SIGNS:  Weight 181 pounds.  Blood pressure 150/58, respirations 16. Pulse 100.  SKIN:  She has some slight bruising to the forearms.  Her face shows some mild to moderate steroid fullness.  BACK:  Nontender.  LUNGS:  Slight rhonchi but overall fairly clear.  HEART:   Regular.  MUSCULOSKELETAL:  The MCPs and especially the wrist appear somewhat swollen. They are tender.  She is also tender anywhere she is palpated throughout the forearm and at the triggerpoint area.  The neck, shoulders, and paraspinous muscles are all quite tender.   The left knee is cool.  There is decided medial joint line tenderness.  The knee flexes fairly  easily to 130 degrees before tenderness.  Ankles and feed had mild tenderness.  ASSESSMENT AND PLAN: 1. Left Bakers cyst.  In review of the March 2003, MRI she has an horizontal    longitudinal tear to the meniscus.  She was evaluated by Dr. ______ in    March and he felt that continuing with injections was the best thing    presently.  Today, I do not feel that she needs an injection.  We will    follow how she does with this. 2. Polyarthralgias:  She will continue with the medications as above. 3. History of rheumatoid arthritis although her wrists appear somewhat swollen    she is tender mostly everywhere.  I have asked her to decrease the    prednisone to 5 mg in about 10 days if she has finished her full course of    sinusitis treatment.  She will return in 3 months or before p.r.n. Dictated by:   Aundra Dubin, M.D. Attending Physician:  Aundra Dubin DD:  07/19/01 TD:  07/23/01 Job: [id number]

## 2010-06-04 NOTE — Op Note (Signed)
NAME:  [name], [name]              ACCOUNT NO.:  [id number]   MEDICAL RECORD NO.:  [id number]          PATIENT TYPE:  AMB   LOCATION:  DAY                           FACILITY:  [facility]   PHYSICIAN:  R. Roetta Sessions, M.D. DATE OF BIRTH:  06-Jan-1941   DATE OF PROCEDURE:  02/22/2006  DATE OF DISCHARGE:                               OPERATIVE REPORT   PROCEDURE:  Colonoscopy screening.   INDICATIONS FOR PROCEDURE:  Patient is a 70 year old lady referred by  Dr. Sherwood Gambler for colorectal cancer screening.  She has no family history of  colorectal neoplasia.  She has never had her lower GI tract imaged.  She  tells me that she really does not have any symptoms aside from she notes  sometimes she feels like her stools may be somewhat burgundy in color  but she denies ever seeing blood.   She feels that stools may have burgundy tent sometimes but does  adamantly deny that she has ever passed the blood.  This procedure is  being done as a screening maneuver.  This approach has been discussed  with the patient at length.  Potential risks, benefits and alternatives  have been reviewed, questions answered and she is agreeable.  Please see  documentation on the medical record.   PROCEDURE NOTE:  O2 saturation, blood pressure, pulse and respirations  were monitored throughout the entire procedure.   CONSCIOUS SEDATION:  Versed 5 mg IV, Demerol 100 mg IV in divided doses.   INSTRUMENT:  Olympus video chip system.   FINDINGS:  Digital rectal exam revealed no abnormalities.   ENDOSCOPIC FINDINGS:  The prep was adequate.  Examination of the colonic  mucosa was undertaken with the scope being advanced from the  rectosigmoid junction through the left transverse, right colon to the  area of the appendiceal orifice, ileocecal valve and cecum.  These  structures well seen photographed for the record.  From this level, the  scope was slowly withdrawn.  All previous mentioned mucosal surfaces  were again  seen.  The patient had left-sided diverticula.  The remainder  of the colonic mucosa appeared normal.  The scope was pulled down in the  rectum where thorough examination of the rectal mucosa including  retroflexed view of the anal verge was undertaken.  The rectal mucosa  appeared normal.  The patient tolerated the procedure well and was  reactive.   ENDOSCOPY IMPRESSION:  1. Normal rectum.  2. Left-sided diverticula.  Remainder of colonic mucosa appeared      normal.   RECOMMENDATIONS:  1. Diverticulosis literature provided to Ms. Reister.  2. Consider repeat colonoscopy for screening purposes in 10 years.      Should she develop any GI symptoms, then further evaluation would      be warranted.      Jonathon Bellows, M.D.  Electronically Signed     RMR/MEDQ  D:  02/22/2006  T:  02/22/2006  Job:  [id number]   cc:   Madelin Rear. Sherwood Gambler, MD  Fax: [phone]

## 2010-06-16 ENCOUNTER — Encounter (HOSPITAL_COMMUNITY): Payer: Medicare Other | Attending: Rheumatology

## 2010-06-16 DIAGNOSIS — M069 Rheumatoid arthritis, unspecified: Secondary | ICD-10-CM | POA: Insufficient documentation

## 2010-07-01 ENCOUNTER — Other Ambulatory Visit (HOSPITAL_COMMUNITY): Payer: Self-pay | Admitting: Rheumatology

## 2010-07-01 DIAGNOSIS — M549 Dorsalgia, unspecified: Secondary | ICD-10-CM

## 2010-07-02 ENCOUNTER — Ambulatory Visit (HOSPITAL_COMMUNITY)
Admission: RE | Admit: 2010-07-02 | Discharge: 2010-07-02 | Disposition: A | Discharge status: Home / Self Care | Payer: Medicare Other | Source: Ambulatory Visit | Attending: Rheumatology | Admitting: Rheumatology

## 2010-07-02 DIAGNOSIS — M51379 Other intervertebral disc degeneration, lumbosacral region without mention of lumbar back pain or lower extremity pain: Secondary | ICD-10-CM | POA: Insufficient documentation

## 2010-07-02 DIAGNOSIS — M545 Low back pain, unspecified: Secondary | ICD-10-CM | POA: Insufficient documentation

## 2010-07-02 DIAGNOSIS — M25559 Pain in unspecified hip: Secondary | ICD-10-CM | POA: Insufficient documentation

## 2010-07-02 DIAGNOSIS — M549 Dorsalgia, unspecified: Secondary | ICD-10-CM

## 2010-07-02 DIAGNOSIS — M5137 Other intervertebral disc degeneration, lumbosacral region: Secondary | ICD-10-CM | POA: Insufficient documentation

## 2010-08-11 ENCOUNTER — Other Ambulatory Visit: Payer: Self-pay | Admitting: Rheumatology

## 2010-08-11 DIAGNOSIS — M4800 Spinal stenosis, site unspecified: Secondary | ICD-10-CM

## 2010-08-11 DIAGNOSIS — M5126 Other intervertebral disc displacement, lumbar region: Secondary | ICD-10-CM

## 2010-08-13 ENCOUNTER — Ambulatory Visit
Admission: RE | Admit: 2010-08-13 | Discharge: 2010-08-13 | Disposition: A | Discharge status: Home / Self Care | Payer: Medicare Other | Source: Ambulatory Visit | Attending: Rheumatology | Admitting: Rheumatology

## 2010-08-13 DIAGNOSIS — M5126 Other intervertebral disc displacement, lumbar region: Secondary | ICD-10-CM

## 2010-08-13 DIAGNOSIS — M4800 Spinal stenosis, site unspecified: Secondary | ICD-10-CM

## 2010-08-13 MED ORDER — IOHEXOL 180 MG/ML  SOLN
1.0000 mL | Freq: Once | INTRAMUSCULAR | Status: AC | PRN
  Administered 2010-08-13: 1 mL via EPIDURAL

## 2010-08-13 MED ORDER — METHYLPREDNISOLONE ACETATE 40 MG/ML INJ SUSP (RADIOLOG
120.0000 mg | Freq: Once | INTRAMUSCULAR | Status: AC
  Administered 2010-08-13: 120 mg via EPIDURAL

## 2010-08-16 ENCOUNTER — Encounter (HOSPITAL_COMMUNITY): Payer: Medicare Other | Attending: Rheumatology

## 2010-08-16 VITALS — BP 117/71 | HR 74 | Temp 98.3°F

## 2010-08-16 DIAGNOSIS — M069 Rheumatoid arthritis, unspecified: Secondary | ICD-10-CM | POA: Insufficient documentation

## 2010-08-16 MED ORDER — DIPHENHYDRAMINE HCL 50 MG/ML IJ SOLN
INTRAMUSCULAR | Status: AC
  Filled 2010-08-16: qty 1

## 2010-08-16 MED ORDER — DIPHENHYDRAMINE HCL 50 MG/ML IJ SOLN: 25.0000 mg | Freq: Once | INTRAMUSCULAR | Status: DC

## 2010-08-16 MED ORDER — SODIUM CHLORIDE 0.9 % IJ SOLN
INTRAMUSCULAR | Status: AC
  Administered 2010-08-16: 14:00:00
  Filled 2010-08-16: qty 10

## 2010-08-16 MED ORDER — SODIUM CHLORIDE 0.9 % IV SOLN
200.0000 mg | Freq: Once | INTRAVENOUS | Status: AC
  Administered 2010-08-16: 200 mg via INTRAVENOUS
  Filled 2010-08-16: qty 20

## 2010-10-04 ENCOUNTER — Encounter (HOSPITAL_COMMUNITY): Payer: Medicare Other | Attending: Rheumatology

## 2010-10-04 VITALS — BP 134/76 | HR 76 | Temp 97.7°F

## 2010-10-04 DIAGNOSIS — M069 Rheumatoid arthritis, unspecified: Secondary | ICD-10-CM | POA: Insufficient documentation

## 2010-10-04 MED ORDER — SODIUM CHLORIDE 0.9 % IV SOLN
200.0000 mg | Freq: Once | INTRAVENOUS | Status: AC
  Administered 2010-10-04: 200 mg via INTRAVENOUS
  Filled 2010-10-04: qty 20

## 2010-10-04 MED ORDER — SODIUM CHLORIDE 0.9 % IV SOLN
INTRAVENOUS | Status: AC
  Administered 2010-10-04: 14:00:00 via INTRAVENOUS

## 2010-10-18 LAB — CBC
HCT: 41.1
HCT: 37.4
Hemoglobin: 13.4
Hemoglobin: 13.7
Hemoglobin: 12.6
MCHC: 33.2
MCHC: 33.8
MCV: 93.7
MCV: 94.9
MCV: 94.3
Platelets: 192
Platelets: 175
RBC: 4.25
RDW: 18.4 — ABNORMAL HIGH
RDW: 18.9 — ABNORMAL HIGH
WBC: 6.6
WBC: 6

## 2010-10-18 LAB — DIFFERENTIAL
Basophils Relative: 0
Basophils Relative: 1
Eosinophils Absolute: 0.1
Eosinophils Absolute: 0.1
Eosinophils Relative: 1
Eosinophils Relative: 2
Lymphocytes Relative: 31
Lymphs Abs: 1.2
Lymphs Abs: 1.9
Lymphs Abs: 1.8
Monocytes Absolute: 0.3
Monocytes Absolute: 0.6
Monocytes Absolute: 0.3
Monocytes Relative: 10
Neutro Abs: 4
Neutro Abs: 6.6

## 2010-10-18 LAB — CULTURE, BLOOD (ROUTINE X 2): Culture: NO GROWTH

## 2010-10-18 LAB — BASIC METABOLIC PANEL
BUN: 22
BUN: 14
CO2: 29
CO2: 29
Calcium: 9.6
Chloride: 100
Chloride: 102
GFR calc Af Amer: >60
GFR calc non Af Amer: 59 — ABNORMAL LOW
GFR calc non Af Amer: >60
Glucose, Bld: 238 — ABNORMAL HIGH
Glucose, Bld: 106 — ABNORMAL HIGH
Potassium: 4.1
Potassium: 4.3
Sodium: 137
Sodium: 136

## 2010-10-18 LAB — GLUCOSE, CAPILLARY: Glucose-Capillary: 126 — ABNORMAL HIGH

## 2010-10-18 LAB — CARDIAC PANEL(CRET KIN+CKTOT+MB+TROPI)
CK, MB: 2.7
CK, MB: 2.9
Relative Index: INVALID
Troponin I: 0.04

## 2010-11-29 ENCOUNTER — Ambulatory Visit (HOSPITAL_COMMUNITY): Payer: Medicare Other

## 2010-12-29 ENCOUNTER — Telehealth (HOSPITAL_COMMUNITY): Payer: Self-pay | Admitting: Oncology

## 2011-01-13 ENCOUNTER — Emergency Department (HOSPITAL_COMMUNITY): Payer: Medicare Other

## 2011-01-13 ENCOUNTER — Emergency Department (HOSPITAL_COMMUNITY)
Admission: EM | Admit: 2011-01-13 | Discharge: 2011-01-13 | Disposition: A | Discharge status: Home / Self Care | Payer: Medicare Other | Attending: Emergency Medicine | Admitting: Emergency Medicine

## 2011-01-13 DIAGNOSIS — S42293A Other displaced fracture of upper end of unspecified humerus, initial encounter for closed fracture: Secondary | ICD-10-CM | POA: Insufficient documentation

## 2011-01-13 DIAGNOSIS — W19XXXA Unspecified fall, initial encounter: Secondary | ICD-10-CM | POA: Insufficient documentation

## 2011-01-13 DIAGNOSIS — E119 Type 2 diabetes mellitus without complications: Secondary | ICD-10-CM | POA: Insufficient documentation

## 2011-01-13 DIAGNOSIS — IMO0001 Reserved for inherently not codable concepts without codable children: Secondary | ICD-10-CM | POA: Insufficient documentation

## 2011-01-13 DIAGNOSIS — M069 Rheumatoid arthritis, unspecified: Secondary | ICD-10-CM | POA: Insufficient documentation

## 2011-01-13 DIAGNOSIS — F17200 Nicotine dependence, unspecified, uncomplicated: Secondary | ICD-10-CM | POA: Insufficient documentation

## 2011-01-13 DIAGNOSIS — Y92009 Unspecified place in unspecified non-institutional (private) residence as the place of occurrence of the external cause: Secondary | ICD-10-CM | POA: Insufficient documentation

## 2011-01-13 DIAGNOSIS — S42201A Unspecified fracture of upper end of right humerus, initial encounter for closed fracture: Secondary | ICD-10-CM

## 2011-01-13 HISTORY — DX: Reserved for concepts with insufficient information to code with codable children: IMO0002

## 2011-01-13 HISTORY — DX: Fibromyalgia: M79.7

## 2011-01-13 HISTORY — DX: Rheumatoid arthritis, unspecified: M06.9

## 2011-01-13 HISTORY — DX: Systemic lupus erythematosus, unspecified: M32.9

## 2011-01-13 LAB — GLUCOSE, CAPILLARY: Glucose-Capillary: 229 mg/dL — ABNORMAL HIGH (ref 70–99)

## 2011-01-13 MED ORDER — OXYCODONE-ACETAMINOPHEN 5-325 MG PO TABS
2.0000 | ORAL_TABLET | Freq: Once | ORAL | Status: AC
  Administered 2011-01-13: 2 via ORAL
  Filled 2011-01-13: qty 2

## 2011-01-13 MED ORDER — ONDANSETRON HCL 4 MG/2ML IJ SOLN
4.0000 mg | Freq: Once | INTRAMUSCULAR | Status: AC
  Administered 2011-01-13: 4 mg via INTRAVENOUS

## 2011-01-13 MED ORDER — ONDANSETRON HCL 4 MG/2ML IJ SOLN
INTRAMUSCULAR | Status: AC
  Filled 2011-01-13: qty 2

## 2011-01-13 MED ORDER — MORPHINE SULFATE 4 MG/ML IJ SOLN
6.0000 mg | INTRAMUSCULAR | Status: DC | PRN
  Administered 2011-01-13: 6 mg via INTRAVENOUS
  Filled 2011-01-13: qty 2

## 2011-01-13 MED ORDER — OXYCODONE-ACETAMINOPHEN 5-325 MG PO TABS: 1.0000 | ORAL_TABLET | ORAL | Status: AC | PRN

## 2011-01-13 NOTE — ED Provider Notes (Signed)
History     CSN: [id number]  Arrival date & time 01/13/11  0220   First MD Initiated Contact with Patient 01/13/11 0240      Chief Complaint  Patient presents with  . Fall     HPI Patient reports following this evening while walking in the bathroom.  She struck the left side of her face and landed on her right elbow.  She complains of severe pain in her right shoulder at this time.  Should no loss consciousness.  She denies neck pain.  She denies trismus or malocclusion.  She is not on anticoagulants.  She denies neck pain.  She denies weakness of her upper lower extremities.  She reports no lower extremity pain.  She denies chest pain and abdominal pain.  She reports her right shoulder is causing her severe pain at this time.  Pain is worsened by range of motion and palpation.  Is improved by nothing.  Her pain is constant Past Medical History  Diagnosis Date  . Diabetes mellitus   . Lupus   . RA (rheumatoid arthritis)   . Fibromyalgia     Past Surgical History  Procedure Date  . Appendectomy   . Cesarean section   . Abdominal hysterectomy   . Oophorectomy     No family history on file.  History  Substance Use Topics  . Smoking status: Current Everyday Smoker  . Smokeless tobacco: Not on file  . Alcohol Use: No    OB History    Grav Para Term Preterm Abortions TAB SAB Ect Mult Living                  Review of Systems  All other systems reviewed and are negative.    Allergies  Review of patient's allergies indicates no known allergies.  Home Medications   Current Outpatient Rx  Name Route Sig Dispense Refill  . HYDROCHLOROTHIAZIDE 12.5 MG PO CAPS Oral Take 12.5 mg by mouth daily.      Marland Kitchen METFORMIN HCL 500 MG PO TABS Oral Take 500 mg by mouth 2 (two) times daily with a meal.      . METOPROLOL TARTRATE 25 MG PO TABS Oral Take 25 mg by mouth 2 (two) times daily.      Marland Kitchen PREDNISOLONE 5 MG PO TABS Oral Take 5 mg by mouth.      . TRAMADOL HCL 50 MG PO TABS  Oral Take 50 mg by mouth every 6 (six) hours as needed. Maximum dose= 8 tablets per day       BP 129/44  Pulse 82  Temp(Src) 97.7 F (36.5 C) (Oral)  Resp 18  SpO2 94%  Physical Exam  Nursing note and vitals reviewed. Constitutional: She is oriented to person, place, and time. She appears well-developed and well-nourished. No distress.  HENT:  Head: Normocephalic and atraumatic.       Small abrasion to left anterior cheek with a laceration.  She has no trismus or malocclusion.  She has no underlying dental injury.  Extraocular movements are intact.  Eyes: EOM are normal.  Neck: Normal range of motion. Neck supple.       No C-spine tenderness  Cardiovascular: Normal rate, regular rhythm and normal heart sounds.   Pulmonary/Chest: Effort normal and breath sounds normal.  Abdominal: Soft. She exhibits no distension. There is no tenderness.  Musculoskeletal:       Patient with significant pain with range of motion right shoulder.  She's tenderness of her proximal  right humerus.  She is a small skin tear of her right proximal forearm overlying the elbow.  She is able to range her right elbow albeit with some pain.  She has a normal right radial and ulnar pulse.  She has normal strength in grip in her right hand.  She has normal sensation in the right upper extremity.  She has full range of motion of her bilateral hips.  She's full range of motion of her left elbow and left shoulder.  Has normal grip strength bilaterally  Neurological: She is alert and oriented to person, place, and time.  Skin: Skin is warm and dry.  Psychiatric: She has a normal mood and affect. Judgment normal.    ED Course  Procedures (including critical care time)  Labs Reviewed  GLUCOSE, CAPILLARY - Abnormal; Notable for the following:    Glucose-Capillary 229 (*)    All other components within normal limits  GLUCOSE, CAPILLARY - Abnormal; Notable for the following:    Glucose-Capillary 229 (*)    All other  components within normal limits   Dg Chest 1 View  01/13/2011  *RADIOLOGY REPORT*  Clinical Data: Status post fall, with right lateral chest pain and right shoulder pain.  CHEST - 1 VIEW  Comparison: Chest radiograph performed 03/28/2010  Findings: The lungs are well-aerated and clear.  There is no evidence of focal opacification, pleural effusion or pneumothorax.  The cardiomediastinal silhouette is borderline normal in size.  The comminuted fracture through the right humeral head and neck is again seen.  IMPRESSION:  1.  Comminuted fracture through the right humeral head and neck, better characterized on concurrent right shoulder radiographs. 2.  No acute cardiopulmonary process seen.  Original Report Authenticated By: Tonia Ghent, M.D.   Dg Elbow Complete Right  01/13/2011  *RADIOLOGY REPORT*  Clinical Data: Status post fall, with right elbow pain.  RIGHT ELBOW - COMPLETE 3+ VIEW  Comparison: None.  Findings: There is no evidence of fracture or dislocation.  The visualized joint spaces are preserved.  No significant joint effusion is identified.  The soft tissues are unremarkable in appearance.  IMPRESSION: No evidence of fracture or dislocation.  Original Report Authenticated By: Tonia Ghent, M.D.   Dg Shoulder Right Port  01/13/2011  *RADIOLOGY REPORT*  Clinical Data: Status post fall, with right shoulder pain.  PORTABLE RIGHT SHOULDER - 2+ VIEW  Comparison: Chest radiograph performed 03/28/2010  Findings: There is a comminuted fracture involving the right humeral head and neck, with inferior subluxation of the right humeral head likely reflecting a joint effusion. There is superior displacement of the more distal humerus, projecting adjacent to the inferior glenoid.  No additional fractures are seen.  The right acromioclavicular joint is unremarkable in appearance.  The visualized portions of the right lung are grossly clear.  IMPRESSION: Comminuted fracture involving the right humeral head and  neck, with inferior subluxation of the right humeral head likely reflecting a joint effusion.  The distal humerus is superiorly displaced, projecting adjacent to the inferior glenoid.  Original Report Authenticated By: Tonia Ghent, M.D.   i personally viewed the xrays  1. Fracture of humerus, proximal, right, closed   2. Fall       MDM  Patient has what appears to be a severely displaced proximal right humerus fracture.  Awaiting orthopedic consultation via phone at this time   4:56 AM Spoke with orthopedics, Dr Hilda Lias who requests the patient be placed in a shoulder immobilizer and follow up in clinic  this afternoon. Fracture described in detail to him. Closed fracture  5:07 AM Patient updated as to the plan.  She reports her pain medicine is beginning to wear off.  Patient be given Percocet now.  Discharge home with followup with the orthopedic surgeon this afternoon.  All questions answered.  Patient's family is in the room and understands the treatment plan.  Lyanne Co, MD 01/13/11 [phone]

## 2011-01-13 NOTE — ED Notes (Signed)
Per ems, pt fell at home about 30 mins prior to arrival.  Pt c/o severe pain to rt shoulder, and per ems has a skin tear to her rt elbow.  Bleeding is controlled.  Pt reports falling while walking to the bathroom and hitting her face against the wall and her rt arm and shoulder.  Pt denies any LOC.  Pt is alert and oriented x 3, pt reports pain to shoulder 9/10.

## 2011-01-20 ENCOUNTER — Ambulatory Visit (HOSPITAL_COMMUNITY): Payer: Medicare Other

## 2011-01-25 DIAGNOSIS — S42209A Unspecified fracture of upper end of unspecified humerus, initial encounter for closed fracture: Secondary | ICD-10-CM | POA: Diagnosis not present

## 2011-01-26 ENCOUNTER — Encounter (HOSPITAL_COMMUNITY): Payer: Self-pay | Admitting: *Deleted

## 2011-01-26 ENCOUNTER — Other Ambulatory Visit: Payer: Self-pay | Admitting: Orthopedic Surgery

## 2011-01-26 DIAGNOSIS — M25519 Pain in unspecified shoulder: Secondary | ICD-10-CM | POA: Diagnosis not present

## 2011-01-26 NOTE — Pre-Procedure Instructions (Addendum)
Pt instructed hibiclens shower hs and am of surgery, neck down avoid private area Chest 1 view x ray 01-13-11 in epic

## 2011-01-27 ENCOUNTER — Encounter (HOSPITAL_COMMUNITY)
Admission: RE | Disposition: A | Discharge status: Home Health | Payer: Self-pay | Source: Ambulatory Visit | Attending: Orthopedic Surgery

## 2011-01-27 ENCOUNTER — Encounter (HOSPITAL_COMMUNITY): Payer: Self-pay | Admitting: *Deleted

## 2011-01-27 ENCOUNTER — Encounter (HOSPITAL_COMMUNITY): Payer: Self-pay | Admitting: Anesthesiology

## 2011-01-27 ENCOUNTER — Other Ambulatory Visit: Payer: Self-pay

## 2011-01-27 ENCOUNTER — Inpatient Hospital Stay (HOSPITAL_COMMUNITY): Payer: Medicare Other | Admitting: Anesthesiology

## 2011-01-27 ENCOUNTER — Inpatient Hospital Stay (HOSPITAL_COMMUNITY): Payer: Medicare Other

## 2011-01-27 ENCOUNTER — Inpatient Hospital Stay (HOSPITAL_COMMUNITY)
Admission: RE | Admit: 2011-01-27 | Discharge: 2011-01-30 | DRG: 483 | Disposition: A | Discharge status: Home Health | Payer: Medicare Other | Source: Ambulatory Visit | Attending: Orthopedic Surgery | Admitting: Orthopedic Surgery

## 2011-01-27 ENCOUNTER — Other Ambulatory Visit (HOSPITAL_COMMUNITY): Payer: Self-pay

## 2011-01-27 DIAGNOSIS — E871 Hypo-osmolality and hyponatremia: Secondary | ICD-10-CM | POA: Diagnosis not present

## 2011-01-27 DIAGNOSIS — M329 Systemic lupus erythematosus, unspecified: Secondary | ICD-10-CM | POA: Diagnosis present

## 2011-01-27 DIAGNOSIS — F17200 Nicotine dependence, unspecified, uncomplicated: Secondary | ICD-10-CM | POA: Diagnosis present

## 2011-01-27 DIAGNOSIS — D638 Anemia in other chronic diseases classified elsewhere: Secondary | ICD-10-CM | POA: Diagnosis present

## 2011-01-27 DIAGNOSIS — E119 Type 2 diabetes mellitus without complications: Secondary | ICD-10-CM | POA: Diagnosis present

## 2011-01-27 DIAGNOSIS — G8918 Other acute postprocedural pain: Secondary | ICD-10-CM | POA: Diagnosis not present

## 2011-01-27 DIAGNOSIS — S42209A Unspecified fracture of upper end of unspecified humerus, initial encounter for closed fracture: Principal | ICD-10-CM | POA: Diagnosis present

## 2011-01-27 DIAGNOSIS — W19XXXA Unspecified fall, initial encounter: Secondary | ICD-10-CM | POA: Diagnosis present

## 2011-01-27 DIAGNOSIS — I10 Essential (primary) hypertension: Secondary | ICD-10-CM | POA: Diagnosis present

## 2011-01-27 DIAGNOSIS — J4489 Other specified chronic obstructive pulmonary disease: Secondary | ICD-10-CM | POA: Diagnosis not present

## 2011-01-27 DIAGNOSIS — M069 Rheumatoid arthritis, unspecified: Secondary | ICD-10-CM | POA: Diagnosis not present

## 2011-01-27 DIAGNOSIS — D62 Acute posthemorrhagic anemia: Secondary | ICD-10-CM | POA: Diagnosis not present

## 2011-01-27 DIAGNOSIS — IMO0001 Reserved for inherently not codable concepts without codable children: Secondary | ICD-10-CM | POA: Diagnosis present

## 2011-01-27 DIAGNOSIS — J449 Chronic obstructive pulmonary disease, unspecified: Secondary | ICD-10-CM | POA: Diagnosis present

## 2011-01-27 DIAGNOSIS — Z96619 Presence of unspecified artificial shoulder joint: Secondary | ICD-10-CM | POA: Diagnosis not present

## 2011-01-27 HISTORY — PX: SHOULDER HEMI-ARTHROPLASTY: SHX5049

## 2011-01-27 LAB — COMPREHENSIVE METABOLIC PANEL
ALT: 13 U/L (ref 0–35)
AST: 17 U/L (ref 0–37)
Albumin: 3.5 g/dL (ref 3.5–5.2)
Alkaline Phosphatase: 109 U/L (ref 39–117)
Chloride: 95 mEq/L — ABNORMAL LOW (ref 96–112)
Potassium: 4.4 mEq/L (ref 3.5–5.1)
Total Bilirubin: 0.6 mg/dL (ref 0.3–1.2)

## 2011-01-27 LAB — GLUCOSE, CAPILLARY
Glucose-Capillary: 189 mg/dL — ABNORMAL HIGH (ref 70–99)
Glucose-Capillary: 151 mg/dL — ABNORMAL HIGH (ref 70–99)
Glucose-Capillary: 220 mg/dL — ABNORMAL HIGH (ref 70–99)

## 2011-01-27 LAB — CBC
HCT: 32.8 % — ABNORMAL LOW (ref 36.0–46.0)
MCHC: 32.3 g/dL (ref 30.0–36.0)
Platelets: 211 10*3/uL (ref 150–400)
RDW: 19.5 % — ABNORMAL HIGH (ref 11.5–15.5)
RDW: 19.2 % — ABNORMAL HIGH (ref 11.5–15.5)
WBC: 8 10*3/uL (ref 4.0–10.5)

## 2011-01-27 LAB — DIFFERENTIAL
Basophils Relative: 1 % (ref 0–1)
Eosinophils Absolute: 0 10*3/uL (ref 0.0–0.7)
Monocytes Absolute: 0.5 10*3/uL (ref 0.1–1.0)
Monocytes Relative: 7 % (ref 3–12)

## 2011-01-27 LAB — TYPE AND SCREEN: Unit division: 0

## 2011-01-27 LAB — ABO/RH: ABO/RH(D): O POS

## 2011-01-27 LAB — APTT: aPTT: 31 seconds (ref 24–37)

## 2011-01-27 LAB — PREPARE RBC (CROSSMATCH)

## 2011-01-27 LAB — SURGICAL PCR SCREEN: MRSA, PCR: NEGATIVE

## 2011-01-27 SURGERY — HEMIARTHROPLASTY, SHOULDER
Anesthesia: General | Site: Shoulder | Laterality: Right | Wound class: Clean

## 2011-01-27 MED ORDER — ONDANSETRON HCL 4 MG/2ML IJ SOLN
INTRAMUSCULAR | Status: DC | PRN
  Administered 2011-01-27: 4 mg via INTRAVENOUS

## 2011-01-27 MED ORDER — DIPHENHYDRAMINE HCL 12.5 MG/5ML PO ELIX: 12.5000 mg | ORAL_SOLUTION | ORAL | Status: DC | PRN

## 2011-01-27 MED ORDER — CEFAZOLIN SODIUM-DEXTROSE 2-3 GM-% IV SOLR
2.0000 g | INTRAVENOUS | Status: AC
  Administered 2011-01-27: 2 g via INTRAVENOUS

## 2011-01-27 MED ORDER — ACETAMINOPHEN 10 MG/ML IV SOLN
INTRAVENOUS | Status: DC | PRN
  Administered 2011-01-27: 1000 mg via INTRAVENOUS

## 2011-01-27 MED ORDER — MENTHOL 3 MG MT LOZG
1.0000 | LOZENGE | OROMUCOSAL | Status: DC | PRN
  Administered 2011-01-28: 3 mg via ORAL
  Filled 2011-01-27 (×2): qty 9

## 2011-01-27 MED ORDER — ACETAMINOPHEN 325 MG PO TABS: 650.0000 mg | ORAL_TABLET | Freq: Four times a day (QID) | ORAL | Status: DC | PRN

## 2011-01-27 MED ORDER — CEFAZOLIN SODIUM-DEXTROSE 2-3 GM-% IV SOLR
INTRAVENOUS | Status: AC
  Filled 2011-01-27: qty 50

## 2011-01-27 MED ORDER — METOPROLOL TARTRATE 25 MG PO TABS
25.0000 mg | ORAL_TABLET | Freq: Two times a day (BID) | ORAL | Status: DC
  Administered 2011-01-27 – 2011-01-30 (×6): 25 mg via ORAL
  Filled 2011-01-27 (×7): qty 1

## 2011-01-27 MED ORDER — METOCLOPRAMIDE HCL 5 MG/ML IJ SOLN
5.0000 mg | Freq: Three times a day (TID) | INTRAMUSCULAR | Status: DC | PRN
  Filled 2011-01-27: qty 2

## 2011-01-27 MED ORDER — FOLIC ACID 1 MG PO TABS
1.0000 mg | ORAL_TABLET | Freq: Every day | ORAL | Status: DC
  Administered 2011-01-27 – 2011-01-30 (×4): 1 mg via ORAL
  Filled 2011-01-27 (×4): qty 1

## 2011-01-27 MED ORDER — ONDANSETRON HCL 4 MG/2ML IJ SOLN
4.0000 mg | Freq: Four times a day (QID) | INTRAMUSCULAR | Status: DC | PRN
  Administered 2011-01-27 – 2011-01-29 (×2): 4 mg via INTRAVENOUS
  Filled 2011-01-27 (×3): qty 2

## 2011-01-27 MED ORDER — PREDNISONE 5 MG PO TABS
5.0000 mg | ORAL_TABLET | Freq: Every day | ORAL | Status: DC
  Administered 2011-01-28 – 2011-01-30 (×3): 5 mg via ORAL
  Filled 2011-01-27 (×3): qty 1

## 2011-01-27 MED ORDER — NEOSTIGMINE METHYLSULFATE 1 MG/ML IJ SOLN
INTRAMUSCULAR | Status: DC | PRN
  Administered 2011-01-27: 4 mg via INTRAVENOUS

## 2011-01-27 MED ORDER — FENTANYL CITRATE 0.05 MG/ML IJ SOLN
25.0000 ug | INTRAMUSCULAR | Status: DC | PRN
  Administered 2011-01-27: 25 ug via INTRAVENOUS

## 2011-01-27 MED ORDER — FENTANYL CITRATE 0.05 MG/ML IJ SOLN
INTRAMUSCULAR | Status: AC
  Filled 2011-01-27: qty 2

## 2011-01-27 MED ORDER — PROMETHAZINE HCL 25 MG/ML IJ SOLN
INTRAMUSCULAR | Status: AC
  Filled 2011-01-27: qty 1

## 2011-01-27 MED ORDER — DOCUSATE SODIUM 100 MG PO CAPS
100.0000 mg | ORAL_CAPSULE | Freq: Two times a day (BID) | ORAL | Status: DC
  Administered 2011-01-27 – 2011-01-30 (×6): 100 mg via ORAL
  Filled 2011-01-27 (×7): qty 1

## 2011-01-27 MED ORDER — PROPOFOL 10 MG/ML IV EMUL
INTRAVENOUS | Status: DC | PRN
  Administered 2011-01-27: 120 mg via INTRAVENOUS

## 2011-01-27 MED ORDER — BISACODYL 5 MG PO TBEC: 5.0000 mg | DELAYED_RELEASE_TABLET | Freq: Every day | ORAL | Status: DC | PRN

## 2011-01-27 MED ORDER — ALUM & MAG HYDROXIDE-SIMETH 200-200-20 MG/5ML PO SUSP: 30.0000 mL | ORAL | Status: DC | PRN

## 2011-01-27 MED ORDER — MUPIROCIN 2 % EX OINT
TOPICAL_OINTMENT | Freq: Two times a day (BID) | CUTANEOUS | Status: DC
  Administered 2011-01-27: 1 via NASAL
  Administered 2011-01-28 – 2011-01-30 (×5): via NASAL
  Filled 2011-01-27: qty 22

## 2011-01-27 MED ORDER — PHENOL 1.4 % MT LIQD
1.0000 | OROMUCOSAL | Status: DC | PRN
  Filled 2011-01-27: qty 177

## 2011-01-27 MED ORDER — 0.9 % SODIUM CHLORIDE (POUR BTL) OPTIME
TOPICAL | Status: DC | PRN
  Administered 2011-01-27: 1000 mL

## 2011-01-27 MED ORDER — ACETAMINOPHEN 10 MG/ML IV SOLN
INTRAVENOUS | Status: AC
  Filled 2011-01-27: qty 100

## 2011-01-27 MED ORDER — LIDOCAINE HCL (CARDIAC) 20 MG/ML IV SOLN
INTRAVENOUS | Status: DC | PRN
  Administered 2011-01-27: 70 mg via INTRAVENOUS

## 2011-01-27 MED ORDER — ROPIVACAINE HCL 5 MG/ML IJ SOLN
INTRAMUSCULAR | Status: AC
  Filled 2011-01-27: qty 30

## 2011-01-27 MED ORDER — LABETALOL HCL 5 MG/ML IV SOLN
INTRAVENOUS | Status: DC | PRN
  Administered 2011-01-27: 5 mg via INTRAVENOUS

## 2011-01-27 MED ORDER — GABAPENTIN 300 MG PO CAPS
300.0000 mg | ORAL_CAPSULE | Freq: Two times a day (BID) | ORAL | Status: DC
  Administered 2011-01-27 – 2011-01-30 (×6): 300 mg via ORAL
  Filled 2011-01-27 (×7): qty 1

## 2011-01-27 MED ORDER — ROPIVACAINE HCL 5 MG/ML IJ SOLN
INTRAMUSCULAR | Status: DC | PRN
  Administered 2011-01-27: 20 mL

## 2011-01-27 MED ORDER — INSULIN ASPART 100 UNIT/ML ~~LOC~~ SOLN
0.0000 [IU] | Freq: Three times a day (TID) | SUBCUTANEOUS | Status: DC
  Administered 2011-01-27: 3 [IU] via SUBCUTANEOUS
  Administered 2011-01-28: 4 [IU] via SUBCUTANEOUS
  Administered 2011-01-28: 7 [IU] via SUBCUTANEOUS
  Administered 2011-01-28: 3 [IU] via SUBCUTANEOUS
  Administered 2011-01-29 (×3): 4 [IU] via SUBCUTANEOUS
  Administered 2011-01-30: 3 [IU] via SUBCUTANEOUS
  Filled 2011-01-27: qty 3

## 2011-01-27 MED ORDER — CEFAZOLIN SODIUM-DEXTROSE 2-3 GM-% IV SOLR
2.0000 g | Freq: Four times a day (QID) | INTRAVENOUS | Status: AC
  Administered 2011-01-27 – 2011-01-28 (×3): 2 g via INTRAVENOUS
  Filled 2011-01-27 (×3): qty 50

## 2011-01-27 MED ORDER — METOCLOPRAMIDE HCL 10 MG PO TABS
5.0000 mg | ORAL_TABLET | Freq: Three times a day (TID) | ORAL | Status: DC | PRN
  Administered 2011-01-30: 10 mg via ORAL
  Filled 2011-01-27: qty 1

## 2011-01-27 MED ORDER — HYDROMORPHONE HCL PF 1 MG/ML IJ SOLN
0.5000 mg | INTRAMUSCULAR | Status: DC | PRN
  Administered 2011-01-27: 0.5 mg via INTRAVENOUS
  Administered 2011-01-28 – 2011-01-29 (×4): 1 mg via INTRAVENOUS
  Filled 2011-01-27 (×4): qty 1

## 2011-01-27 MED ORDER — ONDANSETRON HCL 4 MG PO TABS
4.0000 mg | ORAL_TABLET | Freq: Four times a day (QID) | ORAL | Status: DC | PRN
  Administered 2011-01-30: 4 mg via ORAL
  Filled 2011-01-27: qty 1

## 2011-01-27 MED ORDER — PREDNISONE (PAK) 5 MG PO TABS: ORAL_TABLET | ORAL | Status: DC

## 2011-01-27 MED ORDER — PROMETHAZINE HCL 25 MG/ML IJ SOLN
6.2500 mg | INTRAMUSCULAR | Status: DC | PRN
  Administered 2011-01-27: 6.25 mg via INTRAVENOUS

## 2011-01-27 MED ORDER — METHOCARBAMOL 500 MG PO TABS
500.0000 mg | ORAL_TABLET | Freq: Four times a day (QID) | ORAL | Status: DC | PRN
  Administered 2011-01-28 – 2011-01-30 (×6): 500 mg via ORAL
  Filled 2011-01-27 (×7): qty 1

## 2011-01-27 MED ORDER — POVIDONE-IODINE 7.5 % EX SOLN: Freq: Once | CUTANEOUS | Status: DC

## 2011-01-27 MED ORDER — VITAMIN D3 25 MCG (1000 UNIT) PO TABS
2000.0000 [IU] | ORAL_TABLET | Freq: Two times a day (BID) | ORAL | Status: DC
  Administered 2011-01-27 – 2011-01-30 (×6): 2000 [IU] via ORAL
  Filled 2011-01-27 (×7): qty 2

## 2011-01-27 MED ORDER — GLYCOPYRROLATE 0.2 MG/ML IJ SOLN
INTRAMUSCULAR | Status: DC | PRN
  Administered 2011-01-27: .7 mg via INTRAVENOUS

## 2011-01-27 MED ORDER — LACTATED RINGERS IV SOLN: INTRAVENOUS | Status: DC

## 2011-01-27 MED ORDER — LACTATED RINGERS IV SOLN
INTRAVENOUS | Status: DC
  Administered 2011-01-27 (×2): via INTRAVENOUS

## 2011-01-27 MED ORDER — SENNA 8.6 MG PO TABS
1.0000 | ORAL_TABLET | Freq: Two times a day (BID) | ORAL | Status: DC
  Administered 2011-01-28 – 2011-01-30 (×5): 8.6 mg via ORAL
  Filled 2011-01-27 (×5): qty 1

## 2011-01-27 MED ORDER — FENTANYL CITRATE 0.05 MG/ML IJ SOLN
INTRAMUSCULAR | Status: DC | PRN
  Administered 2011-01-27 (×5): 50 ug via INTRAVENOUS

## 2011-01-27 MED ORDER — ROCURONIUM BROMIDE 100 MG/10ML IV SOLN
INTRAVENOUS | Status: DC | PRN
  Administered 2011-01-27: 50 mg via INTRAVENOUS
  Administered 2011-01-27: 10 mg via INTRAVENOUS

## 2011-01-27 MED ORDER — METHOCARBAMOL 100 MG/ML IJ SOLN
500.0000 mg | Freq: Four times a day (QID) | INTRAVENOUS | Status: DC | PRN
  Administered 2011-01-27 – 2011-01-28 (×2): 500 mg via INTRAVENOUS
  Filled 2011-01-27 (×2): qty 5

## 2011-01-27 MED ORDER — OXYCODONE-ACETAMINOPHEN 5-325 MG PO TABS
1.0000 | ORAL_TABLET | ORAL | Status: DC | PRN
  Administered 2011-01-28 (×2): 1 via ORAL
  Administered 2011-01-28 – 2011-01-30 (×10): 2 via ORAL
  Filled 2011-01-27 (×7): qty 2
  Filled 2011-01-27: qty 1
  Filled 2011-01-27 (×3): qty 2
  Filled 2011-01-27: qty 1

## 2011-01-27 MED ORDER — ACETAMINOPHEN 650 MG RE SUPP: 650.0000 mg | Freq: Four times a day (QID) | RECTAL | Status: DC | PRN

## 2011-01-27 MED ORDER — LACTATED RINGERS IV SOLN: INTRAVENOUS | Status: DC | PRN

## 2011-01-27 MED ORDER — MIDAZOLAM HCL 5 MG/5ML IJ SOLN
INTRAMUSCULAR | Status: DC | PRN
  Administered 2011-01-27 (×2): 1 mg via INTRAVENOUS

## 2011-01-27 MED ORDER — METFORMIN HCL 500 MG PO TABS
500.0000 mg | ORAL_TABLET | Freq: Two times a day (BID) | ORAL | Status: DC
  Administered 2011-01-27 – 2011-01-30 (×6): 500 mg via ORAL
  Filled 2011-01-27 (×7): qty 1

## 2011-01-27 MED ORDER — PHENYLEPHRINE HCL 10 MG/ML IJ SOLN
10.0000 mg | INTRAVENOUS | Status: DC | PRN
  Administered 2011-01-27: 20 ug/min via INTRAVENOUS

## 2011-01-27 MED ORDER — HYDROMORPHONE HCL PF 1 MG/ML IJ SOLN
INTRAMUSCULAR | Status: AC
  Administered 2011-01-27: 0.5 mg via INTRAVENOUS
  Filled 2011-01-27: qty 1

## 2011-01-27 MED ORDER — MORPHINE SULFATE 2 MG/ML IJ SOLN
1.0000 mg | INTRAMUSCULAR | Status: DC | PRN
  Administered 2011-01-27: 1 mg via INTRAVENOUS
  Filled 2011-01-27: qty 1

## 2011-01-27 MED ORDER — SODIUM CHLORIDE 0.9 % IV SOLN
INTRAVENOUS | Status: DC
  Administered 2011-01-27 – 2011-01-28 (×2): via INTRAVENOUS

## 2011-01-27 SURGICAL SUPPLY — 54 items
BAG ZIPLOCK 12X15 (MISCELLANEOUS) ×2 IMPLANT
BIT DRILL 1.6MX128 (BIT) ×2 IMPLANT
BLADE MIC 41X13 (BLADE) ×2 IMPLANT
BOWL SMART MIX CTS (DISPOSABLE) ×2 IMPLANT
BUR CROSS CUT FISSURE 1.2 (BURR) IMPLANT
CEMENT BONE DEPUY (Cement) ×2 IMPLANT
CLOSURE STERI STRIP 1/2 X4 (GAUZE/BANDAGES/DRESSINGS) ×2 IMPLANT
CLOTH BEACON ORANGE TIMEOUT ST (SAFETY) ×2 IMPLANT
COVER SURGICAL LIGHT HANDLE (MISCELLANEOUS) ×6 IMPLANT
DRAPE ORTHO SPLIT 77X108 STRL (DRAPES) ×2
DRAPE POUCH INSTRU U-SHP 10X18 (DRAPES) ×2 IMPLANT
DRAPE SURG 17X11 SM STRL (DRAPES) ×2 IMPLANT
DRAPE SURG ORHT 6 SPLT 77X108 (DRAPES) ×2 IMPLANT
DRAPE U-SHAPE 47X51 STRL (DRAPES) ×2 IMPLANT
DRSG ADAPTIC 3X8 NADH LF (GAUZE/BANDAGES/DRESSINGS) ×2 IMPLANT
DRSG EMULSION OIL 3X16 NADH (GAUZE/BANDAGES/DRESSINGS) IMPLANT
DRSG PAD ABDOMINAL 8X10 ST (GAUZE/BANDAGES/DRESSINGS) ×2 IMPLANT
DURAPREP 26ML APPLICATOR (WOUND CARE) ×2 IMPLANT
ELECT REM PT RETURN 9FT ADLT (ELECTROSURGICAL) ×2
ELECTRODE REM PT RTRN 9FT ADLT (ELECTROSURGICAL) ×1 IMPLANT
EVACUATOR 1/8 PVC DRAIN (DRAIN) ×2 IMPLANT
FACESHIELD LNG OPTICON STERILE (SAFETY) IMPLANT
GLOVE BIOGEL PI IND STRL 8 (GLOVE) ×1 IMPLANT
GLOVE BIOGEL PI INDICATOR 8 (GLOVE) ×1
GLOVE ORTHO TXT STRL SZ7.5 (GLOVE) ×6 IMPLANT
HANDPIECE INTERPULSE COAX TIP (DISPOSABLE) ×1
HOOD SURGICAL BLUE (PROTECTIVE WEAR) ×4 IMPLANT
KIT BASIN OR (CUSTOM PROCEDURE TRAY) ×2 IMPLANT
MANIFOLD NEPTUNE II (INSTRUMENTS) ×2 IMPLANT
NEEDLE MAYO .5 CIRCLE (NEEDLE) ×2 IMPLANT
NS IRRIG 1000ML POUR BTL (IV SOLUTION) ×2 IMPLANT
PACK SHOULDER CUSTOM OPM052 (CUSTOM PROCEDURE TRAY) ×2 IMPLANT
PASSER SUT SWANSON 36MM LOOP (INSTRUMENTS) ×2 IMPLANT
POSITIONER SURGICAL ARM (MISCELLANEOUS) ×2 IMPLANT
SET HNDPC FAN SPRY TIP SCT (DISPOSABLE) ×1 IMPLANT
SET PAD SHOULDER ACCESS (MISCELLANEOUS) ×2 IMPLANT
SLING ARM IMMOBILIZER LRG (SOFTGOODS) ×2 IMPLANT
SPONGE GAUZE 4X4 12PLY (GAUZE/BANDAGES/DRESSINGS) ×2 IMPLANT
SPONGE LAP 18X18 X RAY DECT (DISPOSABLE) ×4 IMPLANT
STAPLER VISISTAT 35W (STAPLE) ×2 IMPLANT
SUT ETHIBOND #5 BRAIDED 30INL (SUTURE) IMPLANT
SUT ETHIBOND 2 OS 4 DA (SUTURE) ×4 IMPLANT
SUT ETHIBOND NAB CT1 #1 30IN (SUTURE) ×4 IMPLANT
SUT FIBERWIRE #2 38 T-5 BLUE (SUTURE) ×12
SUT MNCRL AB 4-0 PS2 18 (SUTURE) ×2 IMPLANT
SUT VIC AB 0 CT1 36 (SUTURE) ×4 IMPLANT
SUT VIC AB 2-0 CT1 27 (SUTURE) ×2
SUT VIC AB 2-0 CT1 TAPERPNT 27 (SUTURE) ×2 IMPLANT
SUTURE FIBERWR #2 38 T-5 BLUE (SUTURE) ×6 IMPLANT
SYRINGE IRR TOOMEY STRL 70CC (SYRINGE) ×2 IMPLANT
TAPE CLOTH SURG 6X10 WHT LF (GAUZE/BANDAGES/DRESSINGS) ×2 IMPLANT
TOWER CARTRIDGE SMART MIX (DISPOSABLE) ×2 IMPLANT
TRAY FOLEY BAG SILVER LF 14FR (CATHETERS) ×2 IMPLANT
WATER STERILE IRR 1500ML POUR (IV SOLUTION) ×2 IMPLANT

## 2011-01-27 NOTE — Anesthesia Postprocedure Evaluation (Signed)
  Anesthesia Post-op Note  Patient: [name]  Procedure(s) Performed:  SHOULDER HEMI-ARTHROPLASTY - interscaline right shoulder  Patient Location: PACU  Anesthesia Type: GA combined with regional for post-op pain  Level of Consciousness: awake and alert   Airway and Oxygen Therapy: Patient Spontanous Breathing  Post-op Pain: mild  Post-op Assessment: Post-op Vital signs reviewed, Patient's Cardiovascular Status Stable, Respiratory Function Stable, Patent Airway and No signs of Nausea or vomiting  Post-op Vital Signs: stable. Hgb is 8.2. Plan to recheck in AM. Dr. Ave Filter aware, and will transfuse if Hgb falls below 7.  Complications: No apparent anesthesia complications

## 2011-01-27 NOTE — Anesthesia Preprocedure Evaluation (Addendum)
Anesthesia Evaluation  Patient identified by MRN, date of birth, ID band Patient awake    Reviewed: Allergy & Precautions, H&P , NPO status , Patient's Chart, lab work & pertinent test results, reviewed documented beta blocker date and time   History of Anesthesia Complications Negative for: history of anesthetic complications  Airway Mallampati: III TM Distance: <3 FB     Dental  (+) Edentulous Upper, Edentulous Lower and Dental Advisory Given Post for dentures:   Pulmonary COPDCurrent Smoker (1ppd for 25 years),  clear to auscultation  Pulmonary exam normal + decreased breath sounds      Cardiovascular Exercise Tolerance: Poor hypertension, Pt. on medications + dysrhythmias (History of SVT 3/12, Rx with adenosine and converted to NSR) Supra Ventricular Tachycardia Regular Normal    Neuro/Psych  Neuromuscular disease    GI/Hepatic negative GI ROS, Neg liver ROS,   Endo/Other  Diabetes mellitus-, Well Controlled, Type 2, Oral Hypoglycemic AgentsHyponatremia  Renal/GU Systemic lupus     Musculoskeletal  (+) Arthritis -, Fibromyalgia -  Abdominal Normal abdominal exam  (+)   Peds  Hematology Anemia   Anesthesia Other Findings   Reproductive/Obstetrics                        Anesthesia Physical Anesthesia Plan  ASA: III  Anesthesia Plan: General   Post-op Pain Management:    Induction: Intravenous  Airway Management Planned: Oral ETT  Additional Equipment:   Intra-op Plan:   Post-operative Plan: Extubation in OR  Informed Consent: I have reviewed the patients History and Physical, chart, labs and discussed the procedure including the risks, benefits and alternatives for the proposed anesthesia with the patient or authorized representative who has indicated his/her understanding and acceptance.   Dental advisory given  Plan Discussed with: CRNA  Anesthesia Plan Comments:          Anesthesia Quick Evaluation

## 2011-01-27 NOTE — Brief Op Note (Signed)
01/27/2011  3:26 PM  PATIENT:  [name]  71 y.o. female  PRE-OPERATIVE DIAGNOSIS:  fracture of right proximal humerus  POST-OPERATIVE DIAGNOSIS:  fracture of right proximal humerus  PROCEDURE:  Procedure(s): SHOULDER HEMI-ARTHROPLASTY  SURGEON:  Surgeon(s): Mable Paris, MD  PHYSICIAN ASSISTANT:   ASSISTANTS: none   ANESTHESIA:   regional and general  EBL:  Total I/O In: 1000 [I.V.:1000] Out: 550 [Blood:550]  BLOOD ADMINISTERED:none  DRAINS: none   LOCAL MEDICATIONS USED:  NONE  SPECIMEN:  No Specimen  DISPOSITION OF SPECIMEN:  N/A  COUNTS:  YES  TOURNIQUET:  * No tourniquets in log *  DICTATION: .Other Dictation: Dictation Number [id number]  PLAN OF CARE: Admit to inpatient   PATIENT DISPOSITION:  PACU - hemodynamically stable.   Delay start of Pharmacological VTE agent (>24hrs) due to surgical blood loss or risk of bleeding:  {YES/NO/NOT APPLICABLE:20182

## 2011-01-27 NOTE — Anesthesia Procedure Notes (Signed)
Anesthesia Regional Block:  Interscalene brachial plexus block  Pre-Anesthetic Checklist: ,, timeout performed, Correct Patient, Correct Site, Correct Laterality, Correct Procedure, Correct Position, site marked, Risks and benefits discussed,  Surgical consent,  Pre-op evaluation,  At surgeon's request and post-op pain management  Laterality: Right  Prep: Dura Prep       Needles:  Injection technique: Single-shot  Needle Type: Stimiplex     Needle Length:cm 9 cm     Additional Needles:  Procedures: ultrasound guided Interscalene brachial plexus block  Nerve Stimulator or Paresthesia:  Response: Yes, 0.5 mA,   Additional Responses:   Narrative:  Start time: 01/27/2011 12:55 PM End time: 01/27/2011 1:05 PM  Performed by: Personally  Anesthesiologist: Mervyn Skeeters Tynlee Bayle MD

## 2011-01-27 NOTE — H&P (Signed)
[name] is an 71 y.o. female.   Chief Complaint: R shoulder pain HPI: R shoulder fracture/dislocation 12/27.  Severe pain and loss of function.  Past Medical History  Diagnosis Date  . Diabetes mellitus   . Lupus     sle  . Fibromyalgia   . RA (rheumatoid arthritis)     ra    Past Surgical History  Procedure Date  . Appendectomy   . Cesarean section x3  . Oophorectomy   . Cataract extraction w/ intraocular lens implant 2010    right eye  . Abdominal hysterectomy   . Left arm surgery     multiple surgeries on  . Trigger finger release yrs ago    right hand  . Knee arthroscopy yrs ago    left knee    History reviewed. No pertinent family history. Social History:  reports that she has been smoking.  She does not have any smokeless tobacco history on file. She reports that she does not drink alcohol or use illicit drugs.  Allergies: Not on File  Medications Prior to Admission  Medication Dose Route Frequency Provider Last Rate Last Dose  . 0.9 %  sodium chloride infusion   Intravenous Continuous Donnal Moat, MD 20 mL/hr at 10/04/10 1339     Medications Prior to Admission  Medication Sig Dispense Refill  . metFORMIN (GLUCOPHAGE) 500 MG tablet Take 500 mg by mouth 2 (two) times daily with a meal.       . metoprolol tartrate (LOPRESSOR) 25 MG tablet Take 25 mg by mouth 2 (two) times daily.       . prednisoLONE 5 MG TABS Take 5 mg by mouth every morning.       . traMADol (ULTRAM) 50 MG tablet Take 50 mg by mouth every 6 (six) hours as needed. Maximum dose= 8 tablets per day        No results found for this or any previous visit (from the past 48 hour(s)). No results found.  Review of Systems  All other systems reviewed and are negative.    There were no vitals taken for this visit. Physical Exam  Constitutional: She is oriented to person, place, and time. She appears well-developed and well-nourished.  HENT:  Head: Atraumatic.  Eyes: EOM are  normal.  Neck: Neck supple.  Cardiovascular: Intact distal pulses.   Respiratory: Effort normal.  GI: Soft.  Musculoskeletal:       Right shoulder: She exhibits decreased range of motion, tenderness and swelling. She exhibits normal pulse.  Neurological: She is alert and oriented to person, place, and time.  Skin: Skin is warm and dry.  Psychiatric: She has a normal mood and affect.     Assessment/Plan R shoulder fracture dislocation, recommend hemiarthroplasty for pain relief and to and try and maximize function. Risks / benefits of surgery discussed Consent on chart  NPO for OR Preop antibiotics She will hold methotrexate x 2 wks.  Mable Paris 01/27/2011, 6:34 AM

## 2011-01-27 NOTE — Transfer of Care (Signed)
Immediate Anesthesia Transfer of Care Note  Patient: [name]  Procedure(s) Performed:  SHOULDER HEMI-ARTHROPLASTY - interscaline right shoulder  Patient Location: PACU  Anesthesia Type: General  Level of Consciousness: sedated, patient cooperative and responds to stimulaton  Airway & Oxygen Therapy: Patient Spontanous Breathing and Patient connected to face mask oxgen  Post-op Assessment: Report given to PACU RN and Post -op Vital signs reviewed and stable  Post vital signs: Reviewed and stable  Complications: No apparent anesthesia complications

## 2011-01-28 LAB — CBC
HCT: 23.1 % — ABNORMAL LOW (ref 36.0–46.0)
Hemoglobin: 7.7 g/dL — ABNORMAL LOW (ref 12.0–15.0)
MCHC: 33.3 g/dL (ref 30.0–36.0)
RBC: 2.47 MIL/uL — ABNORMAL LOW (ref 3.87–5.11)

## 2011-01-28 LAB — GLUCOSE, CAPILLARY: Glucose-Capillary: 207 mg/dL — ABNORMAL HIGH (ref 70–99)

## 2011-01-28 LAB — PREPARE RBC (CROSSMATCH)

## 2011-01-28 LAB — BASIC METABOLIC PANEL
BUN: 12 mg/dL (ref 6–23)
CO2: 23 mEq/L (ref 19–32)
GFR calc non Af Amer: 84 mL/min — ABNORMAL LOW (ref >90)
Glucose, Bld: 193 mg/dL — ABNORMAL HIGH (ref 70–99)
Potassium: 4 mEq/L (ref 3.5–5.1)
Sodium: 127 mEq/L — ABNORMAL LOW (ref 135–145)

## 2011-01-28 MED ORDER — METHOCARBAMOL 500 MG PO TABS: 500.0000 mg | ORAL_TABLET | Freq: Three times a day (TID) | ORAL | Status: AC | PRN

## 2011-01-28 MED ORDER — LIP MEDEX EX OINT
TOPICAL_OINTMENT | CUTANEOUS | Status: AC
  Administered 2011-01-28: 01:00:00
  Filled 2011-01-28: qty 7

## 2011-01-28 MED ORDER — OXYCODONE-ACETAMINOPHEN 5-500 MG PO CAPS: 1.0000 | ORAL_CAPSULE | Freq: Four times a day (QID) | ORAL | Status: AC | PRN

## 2011-01-28 MED ORDER — FUROSEMIDE 10 MG/ML IJ SOLN
20.0000 mg | Freq: Once | INTRAMUSCULAR | Status: AC
  Administered 2011-01-28: 20 mg via INTRAVENOUS
  Filled 2011-01-28: qty 2

## 2011-01-28 NOTE — Progress Notes (Signed)
PATIENT ID: [name]  MRN: [id number]  DOB/AGE:  1940-08-03 / 71 y.o.  1 Day Post-Op Procedure(s) (LRB): SHOULDER HEMI-ARTHROPLASTY (Right)  Subjective: Pain is moderate, improving.  No c/o chest pain or SOB.   No numbness/tingling.   Objective: Vital signs in last 24 hours: Temp:  [97.2 F (36.2 C)-100.5 F (38.1 C)] 98.3 F (36.8 C) (01/11 0653) Pulse Rate:  [86-122] 105  (01/11 0653) Resp:  [15-24] 22  (01/11 0653) BP: (102-180)/(34-69) 150/69 mmHg (01/11 0653) SpO2:  [92 %-100 %] 92 % (01/11 0653) Weight:  [71.668 kg (158 lb)] 71.668 kg (158 lb) (01/10 1059)  Intake/Output from previous day: 01/10 0701 - 01/11 0700 In: 2203.8 [I.V.:2203.8] Out: 825 [Urine:275; Blood:550] Intake/Output this shift:     Basename 01/27/11 1553 01/27/11 1130  HGB 8.2* 10.6*    Basename 01/27/11 1553 01/27/11 1130  WBC 6.9 8.0  RBC 2.68* 3.48*  HCT 25.4* 32.8*  PLT 193 211    Basename 01/27/11 1130  NA 129*  K 4.4  CL 95*  CO2 24  BUN 15  CREATININE 0.72  GLUCOSE 187*  CALCIUM 9.6    Basename 01/27/11 1130  LABPT --  INR 0.99    Physical Exam: Neurovascular intact Sensation intact distally Intact pulses distally Incision: dressing C/D/I  Assessment/Plan: 1 Day Post-Op Procedure(s) (LRB): SHOULDER HEMI-ARTHROPLASTY (Right)   Advance diet Up with therapy D/C IV fluids Plan for discharge tomorrow Non Weight Bearing (NWB)  VTE prophylaxis: intermittent pneumatic compression boots Labs pending this am.  Will consider transfusion if hgb has fallen below 8, for acute bld loss anemia in setting of anemia of chronic disease. Case manager to arrange home health needs with plan for d/c tomorrow.   Mable Paris 01/28/2011, 7:27 AM

## 2011-01-28 NOTE — Progress Notes (Signed)
OT Screen:  Spoke to pt and daughter.  Daughter has been assisting pt for the last 8+ days and knows her precautions, not to move the arm during ADLs.  She verbalizes comfort in donning the splint and has been applying shirt over the sling, which pt prefers.  Reviewed elbow to finger ROM, cueing them to keep palm towards body.  Daughter states she is comfortable with all of this and doesn't need Ot at this time.  They do want physical therapy tomorrow for walking.  Pt has a ramp she can use to get into the house.  [place], OTR/L [phone] 01/28/2011

## 2011-01-28 NOTE — Progress Notes (Signed)
LATE ENTRY:: PA Gus Puma notified pt's pulse has ranged from 128-135 and is in a lot of pain.  New order received for IV dilaudid.

## 2011-01-28 NOTE — Op Note (Signed)
[name], [name] NO.:  [id number]  MEDICAL RECORD NO.:  [id number]  LOCATION:  1608                         FACILITY:  [facility]  PHYSICIAN:  Jones Broom, MD    DATE OF BIRTH:  [birth year]  DATE OF PROCEDURE:  01/27/2011 DATE OF DISCHARGE:                              OPERATIVE REPORT   PREOPERATIVE DIAGNOSIS:  Right shoulder 3 part right shoulder proximal humeral fracture dislocation.  POSTOPERATIVE DIAGNOSIS:  Right shoulder 3 part right shoulder proximal humeral fracture dislocation.  PROCEDURE PERFORMED:  Right shoulder hemiarthroplasty for fracture.  ATTENDING SURGEON:  Jones Broom, MD  ASSISTANT:  None.  ANESTHESIA:  GETA with preoperative interscalene block.  COMPLICATIONS:  None.  DRAINS:  None.  SPECIMENS:  None.  ESTIMATED BLOOD LOSS:  500 cc.  INDICATION FOR SURGERY:  The patient is a 71 year old female who fell on January 13, 2011, suffered a right proximal humeral fracture dislocation.  She was initially treated in the emergency department, followed up with Dr. Doneen Poisson who did not feel comfortable with surgical management of this problem and referred her to me for further evaluation and management.  I saw her in my office yesterday and recommended surgical treatment with hemiarthroplasty to decrease pain and trying restore function.  She understood risks, benefits, and alternatives to the procedure including but not limited to risk of bleeding, infection, damage to neurovascular structures, risk of nonunion of the tuberosities, and potential need for future surgeries. She understood all this and elected to go forward with surgery.  OPERATIVE FINDINGS:  A DePuy cemented hemiarthroplasty with the FX stem was placed with a 48 x 18 head.  The tuberosity repair was felt to be excellent.  Bone quality was poor.  PROCEDURE IN DETAIL:  The patient was identified in the preoperative holding area where I personally  marked the operative site after verifying site, side, and procedure.  She was taken back to the operating room after interscalene block was given by the attending anesthesiologist.  She had general anesthesia and was placed in a beach- chair position with the back elevated about 35 degrees.  The opposite extremity, head, and neck were carefully padded in position.  The right upper extremity was then prepped and draped in a standard sterile fashion and appropriate time-out procedure was carried out.  She did receive preoperative antibiotics.  Approximately 10 cm incision was made from the palpable tip of the coracoid to the center point on the proximal humerus at the level of the axilla.  Dissection was carried down through subcutaneous tissues and the cephalic vein was identified and taken laterally with the deltoid.  The deltopectoral interval was exploited, exposing the underlying fracture.  The conjoined tendon was identified and retractors placed beneath the conjoined tendon medially and the deltoid laterally.  She was noted to have significant oozing throughout the procedure, but no specific source of bleeding.  The biceps tendon was identified and the biceps groove was opened into the rotator interval.  The fracture fragments were then gently identified and freed up with a Cobb elevator.  There was a large posterolateral greater tuberosity fragment and a separate fragment which seemed to include the entire head and  the lesser tuberosity with a portion of the greater tuberosity as well lateral to the bicipital groove.  An osteotome was used to osteotomize the head which was removed from the field and sized.  Control of the greater and lesser tuberosity was then gained with sutures and the glenoid was examined and found to be intact. The shaft was then presented with adduction, extension, and external rotation and debrided of hematoma.  Two sutures were placed through drill holes on  either side of the bicipital groove for later tuberosity repair.  A total of four #2 fiber wires were passed around the greater tuberosity at the bone-tendon junction with 2 superior and 2 inferior for later repair.  After appropriate trialing and sizing and an estimate of the height, the appropriate size implant was cemented in place with a finger packing technique in about 25 degrees of retroversion.  It was reduced and felt to be appropriate in length.  The tuberosities were reduced around the implant nicely with slight overlap on the shaft. Bone graft from the head and from the remainder of the procedure was placed beneath the tuberosities prior to repair.  The FiberWire sutures were then passed with 1 superior 1 anterior going around the implant in the greater tuberosity and tied and subsequently a free needle was used to pass the 2nd suture through the bone-tendon junction of the lesser tuberosity, incorporating that into the repair nicely.  The 2 sutures that had previously been placed through drill holes in the proximal shaft was then passed through the anterior, superior, and posterior rotator cuff in a vertical tension band type configuration.  Everything was moving nicely as a unit and it was felt that length and alignment were quite close to anatomic.  The wound was copiously irrigated with normal saline and closed in layers with 2-0 Vicryl in deep dermal layer and staples for skin closure.  Sterile dressing was then applied including Adaptic, 4 x 4's, ABD's, and tape.  The patient was then allowed to awaken from general anesthesia, transferred to the stretcher, and taken to recovery room in stable condition.  POSTOPERATIVE PLAN:  She will be kept overnight and possibly 2 nights for pain control and to ensure that she is stable medically.  Her hemoglobin started out fairly low preoperatively and she did lose a fair amount of blood during the surgery due to generalized  oozing. Therefore, we will check her hemoglobin in the recovery room, with a low threshold for transfusion given her medical history.     Jones Broom, MD     JC/MEDQ  D:  01/27/2011  T:  01/28/2011  Job:  [id number]

## 2011-01-28 NOTE — Progress Notes (Signed)
  CARE MANAGEMENT NOTE 01/28/2011  Patient:  [name], [name]   Account Number:  [id number]  Date Initiated:  01/28/2011  Documentation initiated by:  Colleen Can  Subjective/Objective Assessment:   DX FX PROXIMAL HUMERUS-RIGHT; RT SHOULDER HEMIARTHROPLASTY     Action/Plan:   CM SPOKE WITH PATIENT AND DAUGHTER. PLANS ARE FOR PT TO RETURN TO HER HOME IN [place] WHERE SHE AND UNCLE WILL BE CAREGIVERS.   Anticipated DC Date:     Anticipated DC Plan:  HOME W HOME HEALTH SERVICES      DC Planning Services  CM consult      Choice offered to / List presented to:  DAUGHTER           Status of service:  In process, will continue to follow Medicare Important Message given?   (If response is "NO", the following Medicare IM given date fields will be blank)  Comments:  01/28/2011 Kathy Alyn Riedinger,RN [facility] CCM [phone] DAUGHTER STATES PATIENT UANBLE TO CARE FOR SELF AND WANTS SOMEONE TO HELP WITH PERSONAL CARE. ADVISED OF DIFFERENCE IN SKILLED CARE VS PERSONAL CARE. DAUGHTER GIVEN A LIST OF PRIVATE DUTY PERSONAL CAREGIVERS THAT IS SELF PAY, ALSO GIVEN A LIST OF HH AGENCIES TO REVIEW.  NO SPECIFIC ORDERS FOR HH SERVICES EXCEPT HOME HEALTH AIDE WHICH IS NOT A STAND ALONE HH SERVICES . IN ORDER TO SEND OUT AIDE MUST HAVE SKILLED SERVICE SUCH AS HHRN OR HHPT. COPIES ALSO PLACED IN SHADOW CHART. CM WILL FOLLOW UP FOR SPECIFIC ORDERS FOR [facility] SKILLED SERVICES.

## 2011-01-28 NOTE — Clinical Documentation Improvement (Signed)
Anemia Blood Loss Clarification  THIS DOCUMENT IS NOT A PERMANENT PART OF THE MEDICAL RECORD  RESPOND TO THE THIS QUERY, FOLLOW THE INSTRUCTIONS BELOW:  1. If needed, update documentation for the patient's encounter via the notes activity.  2. Access this query again and click edit on the In [employer].  3. After updating, or not, click F2 to complete all highlighted (required) fields concerning your review. Select "additional documentation in the medical record" OR "no additional documentation provided".  4. Click Sign note button.  5. The deficiency will fall out of your In Basket *Please let us know if you are not able to complete this workflow by phone or e-mail (listed below).        01/28/11  Dear Dr. Ave Filter Marton Redwood  In an effort to better capture your patient's severity of illness, reflect appropriate length of stay and utilization of resources, a review of the patient medical record has revealed the following indicators.    Based on your clinical judgment, please clarify and document in a progress note and/or discharge summary the clinical condition associated with the following supporting information:  In responding to this query please exercise your independent judgment.  The fact that a query is asked, does not imply that any particular answer is desired or expected.  Based on the patient's current clinical presentation please clarify:  According to lab pt's H/H=7.7/23.1. Please clarify based on abnormal H/H whether or not anemia can be further specified as one of the diagnoses listed below and document in pn or d/c summary.  Possible Clinical Conditions?   " Expected Acute Blood Loss Anemia  " Acute Blood Loss Anemia  " Acute on chronic blood loss anemia  " Other Condition________________  " Cannot Clinically Determine  Risk Factors: (recent surgery, pre op anemia, EBL in OR)  Supporting Information: TSA  Signs and Symptoms  Abn  H/H  Diagnostics: Component HGB HCT  Latest Ref Rng 12.0 - 15.0 g/dL 16.1 - 09.6 %  0/45/4098 10.6 (L) 32.8 (L)  01/27/2011 8.2 (L) 25.4 (L)  01/28/2011 7.7 (L) 23.1 (L)  Treatments: Transfusion: IV fluids Normal Saline Serial H&H monitoring   Reviewed: additional documentation in the medical record 01/30/11 ljh Agreed   Thank You,  Enis Slipper  RN, BSN, CCDS Clinical Documentation Specialist [facility] Dept Pager: [phone] / E-mail: [email or web address]  Health Information Management [place]

## 2011-01-29 LAB — CBC
Hemoglobin: 9 g/dL — ABNORMAL LOW (ref 12.0–15.0)
MCH: 30 pg (ref 26.0–34.0)
MCHC: 33.1 g/dL (ref 30.0–36.0)
Platelets: 137 10*3/uL — ABNORMAL LOW (ref 150–400)
RDW: 18.5 % — ABNORMAL HIGH (ref 11.5–15.5)

## 2011-01-29 LAB — BASIC METABOLIC PANEL
Calcium: 8.7 mg/dL (ref 8.4–10.5)
GFR calc Af Amer: >90 mL/min (ref >90)
GFR calc non Af Amer: 87 mL/min — ABNORMAL LOW (ref >90)
Glucose, Bld: 166 mg/dL — ABNORMAL HIGH (ref 70–99)
Potassium: 3.9 mEq/L (ref 3.5–5.1)
Sodium: 133 mEq/L — ABNORMAL LOW (ref 135–145)

## 2011-01-29 LAB — GLUCOSE, CAPILLARY: Glucose-Capillary: 194 mg/dL — ABNORMAL HIGH (ref 70–99)

## 2011-01-29 NOTE — Progress Notes (Signed)
Subjective: 2 Days Post-Op Procedure(s) (LRB): SHOULDER HEMI-ARTHROPLASTY (Right)  Activity level:  Still in a great deal of pain and using IV meds. Would like home health as well Diet tolerance:  eating Voiding  ok Patient reports pain as 5 on 0-10 scale.    Objective: Vital signs in last 24 hours: Temp:  [97.7 F (36.5 C)-99.6 F (37.6 C)] 98.8 F (37.1 C) (01/12 0531) Pulse Rate:  [81-101] 98  (01/12 0531) Resp:  [16-20] 18  (01/12 0531) BP: (105-150)/(46-69) 130/54 mmHg (01/12 0531) SpO2:  [94 %-100 %] 99 % (01/12 0531)  Intake/Output from previous day: 01/11 0701 - 01/12 0700 In: 2748.8 [P.O.:720; I.V.:1463.8; Blood:565] Out: 2800 [Urine:2800] Intake/Output this shift:     Basename 01/29/11 0532 01/28/11 0802 01/27/11 1553 01/27/11 1130  HGB 9.0* 7.7* 8.2* 10.6*    Basename 01/29/11 0532 01/28/11 0802  WBC 6.0 7.3  RBC 3.00* 2.47*  HCT 27.2* 23.1*  PLT 137* 169    Basename 01/29/11 0532 01/28/11 0802  NA 133* 127*  K 3.9 4.0  CL 99 95*  CO2 26 23  BUN 9 12  CREATININE 0.66 0.75  GLUCOSE 166* 193*  CALCIUM 8.7 8.5    Basename 01/27/11 1130  LABPT --  INR 0.99    PHYSICAL EXAM:  Neurologically intact ABD soft Neurovascular intact Sensation intact distally Intact pulses distally Dorsiflexion/Plantar flexion intact No cellulitis present  Assessment/Plan: 2 Days Post-Op Procedure(s) (LRB): SHOULDER HEMI-ARTHROPLASTY (Right) Advance diet Up with therapy Discharge home with home health sun. Will arrange through case manaagement  Cecila Satcher R 01/29/2011, 9:02 AM

## 2011-01-29 NOTE — Progress Notes (Signed)
Physical Therapy Evaluation Patient Details Name: [name] MRN: [id number] DOB: May 27, 1940 Today's Date: 01/29/2011  [phone] EVII  Problem List:  Patient Active Problem List  Diagnoses  . Rheumatoid arthritis    Past Medical History:  Past Medical History  Diagnosis Date  . Diabetes mellitus   . Lupus     sle  . Fibromyalgia   . RA (rheumatoid arthritis)     ra   Past Surgical History:  Past Surgical History  Procedure Date  . Appendectomy   . Cesarean section x3  . Oophorectomy   . Cataract extraction w/ intraocular lens implant 2010    right eye  . Abdominal hysterectomy   . Left arm surgery     multiple surgeries on  . Trigger finger release yrs ago    right hand  . Knee arthroscopy yrs ago    left knee    PT Assessment/Plan/Recommendation PT Assessment Clinical Impression Statement: Pt presents s/p R shoulder hemiarthroplasty with sling to protect RUE.  Pt tolerated ambulation well, however fatigued quickly.  Family present during session and presented overally concerned/overly invovled in pts care.  Pt to D/C home with son and sister as caregivers.  Pts family requesting HHRN.  PT recommends HHPT to address balance and endurance deficits.   PT Recommendation/Assessment: Patient will need skilled PT in the acute care venue PT Problem List: Decreased activity tolerance;Decreased balance;Decreased knowledge of use of DME;Pain Barriers to Discharge: Decreased caregiver support Barriers to Discharge Comments: Pt family states that someone will not always be there for 24/7 care/supervision.   PT Therapy Diagnosis : Difficulty walking;Acute pain PT Plan PT Frequency: Min 5X/week PT Treatment/Interventions: DME instruction;Gait training;Therapeutic exercise;Balance training;Functional mobility training;Therapeutic activities PT Recommendation Follow Up Recommendations: Home health PT Equipment Recommended: None recommended by PT PT Goals  Acute Rehab PT  Goals PT Goal Formulation: With patient/family Time For Goal Achievement: 5 days Pt will go Sit to Stand: with modified independence PT Goal: Sit to Stand - Progress: Not met Pt will go Stand to Sit: with modified independence PT Goal: Stand to Sit - Progress: Not met Pt will Ambulate: >150 feet;with modified independence;with least restrictive assistive device PT Goal: Ambulate - Progress: Not met Pt will Go Up / Down Stairs: 6-9 stairs;with supervision;with least restrictive assistive device PT Goal: Up/Down Stairs - Progress: Not met Pt will Perform Home Exercise Program: with supervision, verbal cues required/provided PT Goal: Perform Home Exercise Program - Progress: Not met  PT Evaluation Precautions/Restrictions  Precautions Precautions: Shoulder Required Braces or Orthoses: Yes Restrictions Weight Bearing Restrictions: Yes RUE Weight Bearing: Weight bearing as tolerated Prior Functioning  Home Living Lives With: Sheran Spine Help From: Family Type of Home: House Home Layout: One level Home Access: Stairs to enter Entrance Stairs-Rails: Left Entrance Stairs-Number of Steps: 6 Home Adaptive Equipment: Walker - rolling;Straight cane Prior Function Level of Independence: Independent with basic ADLs;Requires assistive device for independence Driving: Yes Vocation: Retired Financial risk analyst Arousal/Alertness: Awake/alert Overall Cognitive Status: Appears within functional limits for tasks assessed Orientation Level: Oriented X4 Sensation/Coordination Sensation Light Touch: Appears Intact Coordination Gross Motor Movements are Fluid and Coordinated: Yes Extremity Assessment RLE Assessment RLE Assessment: Within Functional Limits LLE Assessment LLE Assessment: Within Functional Limits Mobility (including Balance) Bed Mobility Bed Mobility: Yes Left Sidelying to Sit: 5: Supervision Left Sidelying to Sit Details (indicate cue type and reason): Required  supervision for safety with cues for hand placement.  Transfers Transfers: Yes Sit to Stand: 5: Supervision Sit to  Stand Details (indicate cue type and reason): Required supervision for safety with no cues needed for hand placement.   Stand to Sit: 5: Supervision Stand to Sit Details: Supervision for safety Ambulation/Gait Ambulation/Gait: Yes Ambulation/Gait Assistance: 4: Min assist Ambulation/Gait Assistance Details (indicate cue type and reason): Min/guard for safety due to minor imbalance.  Cues given for sequencing and technique with SPC and upright posture.   Ambulation Distance (Feet): 50 Feet Assistive device: Straight cane Gait Pattern: Step-through pattern;Trunk flexed Gait velocity: WFL Stairs: No Wheelchair Mobility Wheelchair Mobility: No    Exercise  General Exercises - Lower Extremity Ankle Circles/Pumps: AROM;Both;5 reps Short Arc Quad: AROM;Both;5 reps Heel Slides: AROM;Both;5 reps Hip ABduction/ADduction: AROM;Both;5 reps End of Session PT - End of Session Equipment Utilized During Treatment: Other (comment) (sling for R UE) Activity Tolerance: Patient tolerated treatment well Patient left: in chair;with call bell in reach;with family/visitor present Nurse Communication: Mobility status for transfers General Behavior During Session: [facility] for tasks performed Cognition: [facility] for tasks performed  Page, Meribeth Mattes 01/29/2011, 10:39 AM

## 2011-01-29 NOTE — Progress Notes (Addendum)
CM spoke with pt with daughter [name] at bedside concerning d/c planning. Per daughter request [facility] to provide HHPT/HHA. Family request no DME. Daughter to provide 24 hour care. Daughter's contact number is [phone].  Leonie Green [phone]

## 2011-01-30 LAB — GLUCOSE, CAPILLARY: Glucose-Capillary: 142 mg/dL — ABNORMAL HIGH (ref 70–99)

## 2011-01-30 NOTE — Progress Notes (Signed)
PT Cancellation Note  Treatment cancelled today due to Patient was just observed walking in hallway with her son's assist.  They both confirmed that she will have 24 hour assist at discharge.  I reviewed NWB status of right arm with patient and son.  They state they are waiting for the MD for discharge.  HHPT was recommended by the evaluating therapist.  PT to check back in PM if patient is still here to go on another walk.  Lurena Joiner B. Shay Jhaveri, PT, DPT [phone] 01/30/2011, 10:09 AM

## 2011-01-30 NOTE — Progress Notes (Signed)
PT Cancellation Note  Treatment cancelled today due to The patient is dressed and has her discharge papers.  PT answered residual questions and helped daughter reposition the brace.  Lurena Joiner B. Morad Tal, PT, DPT [phone] 01/30/2011, 1:09 PM

## 2011-01-30 NOTE — Progress Notes (Signed)
Patient discharged to home. DC instructions given with daughter and son at bedside. No concerns voiced. Prescriptions x 2 given for pain and muscle spasm. Pt unable to sign DC instructions due to limitation in rt arm from surgery ; therefore daughter signed paperwork. Let unit in wheelchair pushed by nurse tech. Left in good condition. Medicated with pain and nausea med prior to DC per patient's request.

## 2011-01-30 NOTE — Progress Notes (Signed)
Subjective: 3 Days Post-Op Procedure(s) (LRB): SHOULDER HEMI-ARTHROPLASTY (Right)  Activity level:  Ok with therapy. Patient feels she needs a hospital bed at home Diet tolerance:  eating Voiding  ok Patient reports pain as 2 on 0-10 scale.    Objective: Vital signs in last 24 hours: Temp:  [98 F (36.7 C)-98.7 F (37.1 C)] 98 F (36.7 C) (01/13 0605) Pulse Rate:  [95-107] 96  (01/13 0605) Resp:  [18-20] 20  (01/13 0605) BP: (119-138)/(65-73) 136/65 mmHg (01/13 0605) SpO2:  [94 %-96 %] 94 % (01/13 0605)  Intake/Output from previous day: 01/12 0701 - 01/13 0700 In: -  Out: 550 [Urine:550] Intake/Output this shift:     Basename 01/29/11 0532 01/28/11 0802 01/27/11 1553  HGB 9.0* 7.7* 8.2*    Basename 01/29/11 0532 01/28/11 0802  WBC 6.0 7.3  RBC 3.00* 2.47*  HCT 27.2* 23.1*  PLT 137* 169    Basename 01/29/11 0532 01/28/11 0802  NA 133* 127*  K 3.9 4.0  CL 99 95*  CO2 26 23  BUN 9 12  CREATININE 0.66 0.75  GLUCOSE 166* 193*  CALCIUM 8.7 8.5   No results found for this basename: LABPT:2,INR:2 in the last 72 hours  PHYSICAL EXAM:  Neurologically intact ABD soft Neurovascular intact Sensation intact distally Intact pulses distally No cellulitis present Compartment soft  Dressing changed and wound looks good  Assessment/Plan: 3 Days Post-Op Procedure(s) (LRB): SHOULDER HEMI-ARTHROPLASTY (Right) Advance diet Up with therapy Discharge home with home health  Would benefit from home health aid and therapy and hospital bed  Talar Fraley R 01/30/2011, 11:59 AM

## 2011-01-31 ENCOUNTER — Encounter (HOSPITAL_COMMUNITY): Payer: Self-pay | Admitting: Orthopedic Surgery

## 2011-01-31 DIAGNOSIS — Z471 Aftercare following joint replacement surgery: Secondary | ICD-10-CM | POA: Diagnosis not present

## 2011-01-31 DIAGNOSIS — Z5189 Encounter for other specified aftercare: Secondary | ICD-10-CM | POA: Diagnosis not present

## 2011-01-31 DIAGNOSIS — E119 Type 2 diabetes mellitus without complications: Secondary | ICD-10-CM | POA: Diagnosis not present

## 2011-01-31 DIAGNOSIS — M069 Rheumatoid arthritis, unspecified: Secondary | ICD-10-CM | POA: Diagnosis not present

## 2011-01-31 DIAGNOSIS — M329 Systemic lupus erythematosus, unspecified: Secondary | ICD-10-CM | POA: Diagnosis not present

## 2011-01-31 DIAGNOSIS — IMO0001 Reserved for inherently not codable concepts without codable children: Secondary | ICD-10-CM | POA: Diagnosis not present

## 2011-01-31 NOTE — Discharge Summary (Signed)
Patient ID: MAKISHA MARRIN MRN: [id number] DOB/AGE: 06/09/1940 71 y.o.  Admit date: 01/27/2011 Discharge date: 01/31/2011  Admission Diagnoses:  R proximal humerus fracture  Discharge Diagnoses:  Same  Past Medical History  Diagnosis Date  . Diabetes mellitus   . Lupus     sle  . Fibromyalgia   . RA (rheumatoid arthritis)     ra    Surgeries: Procedure(s): SHOULDER HEMI-ARTHROPLASTY on 01/27/2011   Consultants:  none  Discharged Condition: Improved  Hospital Course: BRITINY DEFRAIN is an 71 y.o. female who was admitted 01/27/2011 for operative treatment of R proximal humerus fx. Patient has severe unremitting pain that affects sleep, daily activities, and work/hobbies. The operating room on 01/27/2011 and underwent  Procedure(s): SHOULDER HEMI-ARTHROPLASTY.    Patient was given perioperative antibiotics: Anti-infectives     Start     Dose/Rate Route Frequency Ordered Stop   01/27/11 1900   ceFAZolin (ANCEF) IVPB 2 g/50 mL premix        2 g 100 mL/hr over 30 Minutes Intravenous Every 6 hours 01/27/11 1728 01/28/11 0823   01/27/11 1115   ceFAZolin (ANCEF) IVPB 2 g/50 mL premix        2 g 100 mL/hr over 30 Minutes Intravenous 60 min pre-op 01/27/11 1103 01/27/11 1311           Patient was given sequential compression devices, early ambulation to prevent DVT.  Patient benefited maximally from hospital stay and there were no complications.  She was noted to have acute on chronic anemia, secondary to blood loss from surgery and anemia of chronic disease.  She received 2 units pRBCs and tolerated this well.  She worked with PT and OT and had home health services arranged.  She was stable and comfortable on her day of discharge.  Recent vital signs: No data found.    Recent laboratory studies:  [facility] 01/29/11 0532  WBC 6.0  HGB 9.0*  HCT 27.2*  PLT 137*  NA 133*  K 3.9  CL 99  CO2 26  BUN 9  CREATININE 0.66  GLUCOSE 166*  INR --  CALCIUM 8.7      Discharge Medications:  Discharge Medication List as of 01/30/2011 12:52 PM    START taking these medications   Details  methocarbamol (ROBAXIN) 500 MG tablet Take 1 tablet (500 mg total) by mouth 3 (three) times daily as needed., Starting 01/28/2011, Until Mon 02/07/11, Print    !! oxyCODONE-acetaminophen (TYLOX) 5-500 MG per capsule Take 1-2 capsules by mouth every 6 (six) hours as needed for pain., Starting 01/28/2011, Until Mon 02/07/11, Print     !! - Potential duplicate medications found. Please discuss with provider.    CONTINUE these medications which have NOT CHANGED   Details  Chlorphen-Phenyleph-ASA (ALKA-SELTZER PLUS COLD PO) Take by mouth., Until Discontinued, Historical Med    cholecalciferol (VITAMIN D) 1000 UNITS tablet Take 1,000 Units by mouth 2 (two) times daily. Takes 2000 units twice day, Until Discontinued, Historical Med    DiphenhydrAMINE HCl (ALKA-SELTZER PLUS ALLERGY PO) Take by mouth., Until Discontinued, Historical Med    folic acid (FOLVITE) 1 MG tablet Take 1 mg by mouth daily., Until Discontinued, Historical Med    gabapentin (NEURONTIN) 300 MG capsule Take 300 mg by mouth 2 (two) times daily., Until Discontinued, Historical Med    metFORMIN (GLUCOPHAGE) 500 MG tablet Take 500 mg by mouth 2 (two) times daily with a meal. , Until Discontinued, Historical Med    methotrexate (RHEUMATREX)  7.5 MG tablet Take 7.5 mg by mouth 2 (two) times a week. Caution" Chemotherapy. Protect from light. Takes on Tuesday and wednesday, Until Discontinued, Historical Med    metoprolol tartrate (LOPRESSOR) 25 MG tablet Take 25 mg by mouth 2 (two) times daily. , Until Discontinued, Historical Med    !! oxyCODONE-acetaminophen (TYLOX) 5-500 MG per capsule Take 1 capsule by mouth every 4 (four) hours as needed., Until Discontinued, Historical Med    prednisoLONE 5 MG TABS Take 5 mg by mouth every morning. , Until Discontinued, Historical Med    predniSONE (DELTASONE) 5 MG  tablet Take 5 mg by mouth daily., Until Discontinued, Historical Med    traMADol (ULTRAM) 50 MG tablet Take 50 mg by mouth every 6 (six) hours as needed. Maximum dose= 8 tablets per day, Until Discontinued, Historical Med     !! - Potential duplicate medications found. Please discuss with provider.      Diagnostic Studies: Dg Chest 1 View  01/13/2011  *RADIOLOGY REPORT*  Clinical Data: Status post fall, with right lateral chest pain and right shoulder pain.  CHEST - 1 VIEW  Comparison: Chest radiograph performed 03/28/2010  Findings: The lungs are well-aerated and clear.  There is no evidence of focal opacification, pleural effusion or pneumothorax.  The cardiomediastinal silhouette is borderline normal in size.  The comminuted fracture through the right humeral head and neck is again seen.  IMPRESSION:  1.  Comminuted fracture through the right humeral head and neck, better characterized on concurrent right shoulder radiographs. 2.  No acute cardiopulmonary process seen.  Original Report Authenticated By: Tonia Ghent, M.D.   Dg Elbow Complete Right  01/13/2011  *RADIOLOGY REPORT*  Clinical Data: Status post fall, with right elbow pain.  RIGHT ELBOW - COMPLETE 3+ VIEW  Comparison: None.  Findings: There is no evidence of fracture or dislocation.  The visualized joint spaces are preserved.  No significant joint effusion is identified.  The soft tissues are unremarkable in appearance.  IMPRESSION: No evidence of fracture or dislocation.  Original Report Authenticated By: Tonia Ghent, M.D.   Dg Shoulder Right Port  01/27/2011  *RADIOLOGY REPORT*  Clinical Data: Hemiarthroplasty  PORTABLE RIGHT SHOULDER - 2+ VIEW  Comparison: 01/13/2011 chest x-ray  Findings: Right shoulder hemiarthroplasty has been placed. A bone fragments most likely, from the humeral head projects over the neck of the prosthesis.  There is otherwise otherwise anatomic alignment of the osseous and prosthetic structures.   IMPRESSION: Right shoulder hemiarthroplasty as described.  Original Report Authenticated By: Donavan Burnet, M.D.   Dg Shoulder Right Port  01/13/2011  *RADIOLOGY REPORT*  Clinical Data: Status post fall, with right shoulder pain.  PORTABLE RIGHT SHOULDER - 2+ VIEW  Comparison: Chest radiograph performed 03/28/2010  Findings: There is a comminuted fracture involving the right humeral head and neck, with inferior subluxation of the right humeral head likely reflecting a joint effusion. There is superior displacement of the more distal humerus, projecting adjacent to the inferior glenoid.  No additional fractures are seen.  The right acromioclavicular joint is unremarkable in appearance.  The visualized portions of the right lung are grossly clear.  IMPRESSION: Comminuted fracture involving the right humeral head and neck, with inferior subluxation of the right humeral head likely reflecting a joint effusion.  The distal humerus is superiorly displaced, projecting adjacent to the inferior glenoid.  Original Report Authenticated By: Tonia Ghent, M.D.    Disposition: Home or Self Care  Discharge Orders    Future Orders  Please Complete By Expires   Diet - low sodium heart healthy      Diet - low sodium heart healthy      Call MD / Call 911      Comments:   If you experience chest pain or shortness of breath, CALL 911 and be transported to the hospital emergency room.  If you develope a fever above 101 F, pus (white drainage) or increased drainage or redness at the wound, or calf pain, call your surgeon's office.   Constipation Prevention      Comments:   Drink plenty of fluids.  Prune juice may be helpful.  You may use a stool softener, such as Colace (over the counter) 100 mg twice a day.  Use MiraLax (over the counter) for constipation as needed.   Increase activity slowly as tolerated      Weight Bearing as taught in Physical Therapy      Comments:   Use a walker or crutches as instructed.    Driving restrictions      Comments:   No driving for 6 weeks   Call MD / Call 911      Comments:   If you experience chest pain or shortness of breath, CALL 911 and be transported to the hospital emergency room.  If you develope a fever above 101 F, pus (white drainage) or increased drainage or redness at the wound, or calf pain, call your surgeon's office.   Constipation Prevention      Comments:   Drink plenty of fluids.  Prune juice may be helpful.  You may use a stool softener, such as Colace (over the counter) 100 mg twice a day.  Use MiraLax (over the counter) for constipation as needed.   Increase activity slowly as tolerated      Weight Bearing as taught in Physical Therapy      Comments:   Use a walker or crutches as instructed.      Follow-up Information    Follow up with Mable Paris, MD in 2 weeks.   Contact information:   [facility] [address], [address] [place] [address] [phone]       Follow up with [facility]. (Home Physical Therapy, Home Health Aide)           Signed: Mable Paris 01/31/2011, 8:02 PM

## 2011-02-01 DIAGNOSIS — M069 Rheumatoid arthritis, unspecified: Secondary | ICD-10-CM | POA: Diagnosis not present

## 2011-02-01 DIAGNOSIS — M329 Systemic lupus erythematosus, unspecified: Secondary | ICD-10-CM | POA: Diagnosis not present

## 2011-02-01 DIAGNOSIS — IMO0001 Reserved for inherently not codable concepts without codable children: Secondary | ICD-10-CM | POA: Diagnosis not present

## 2011-02-01 DIAGNOSIS — Z5189 Encounter for other specified aftercare: Secondary | ICD-10-CM | POA: Diagnosis not present

## 2011-02-01 DIAGNOSIS — Z471 Aftercare following joint replacement surgery: Secondary | ICD-10-CM | POA: Diagnosis not present

## 2011-02-01 DIAGNOSIS — E119 Type 2 diabetes mellitus without complications: Secondary | ICD-10-CM | POA: Diagnosis not present

## 2011-02-02 DIAGNOSIS — IMO0001 Reserved for inherently not codable concepts without codable children: Secondary | ICD-10-CM | POA: Diagnosis not present

## 2011-02-02 DIAGNOSIS — M069 Rheumatoid arthritis, unspecified: Secondary | ICD-10-CM | POA: Diagnosis not present

## 2011-02-02 DIAGNOSIS — Z471 Aftercare following joint replacement surgery: Secondary | ICD-10-CM | POA: Diagnosis not present

## 2011-02-02 DIAGNOSIS — M329 Systemic lupus erythematosus, unspecified: Secondary | ICD-10-CM | POA: Diagnosis not present

## 2011-02-02 DIAGNOSIS — E119 Type 2 diabetes mellitus without complications: Secondary | ICD-10-CM | POA: Diagnosis not present

## 2011-02-02 DIAGNOSIS — Z5189 Encounter for other specified aftercare: Secondary | ICD-10-CM | POA: Diagnosis not present

## 2011-02-03 DIAGNOSIS — E119 Type 2 diabetes mellitus without complications: Secondary | ICD-10-CM | POA: Diagnosis not present

## 2011-02-03 DIAGNOSIS — Z471 Aftercare following joint replacement surgery: Secondary | ICD-10-CM | POA: Diagnosis not present

## 2011-02-03 DIAGNOSIS — M329 Systemic lupus erythematosus, unspecified: Secondary | ICD-10-CM | POA: Diagnosis not present

## 2011-02-03 DIAGNOSIS — M069 Rheumatoid arthritis, unspecified: Secondary | ICD-10-CM | POA: Diagnosis not present

## 2011-02-03 DIAGNOSIS — Z5189 Encounter for other specified aftercare: Secondary | ICD-10-CM | POA: Diagnosis not present

## 2011-02-03 DIAGNOSIS — IMO0001 Reserved for inherently not codable concepts without codable children: Secondary | ICD-10-CM | POA: Diagnosis not present

## 2011-02-04 DIAGNOSIS — S42209A Unspecified fracture of upper end of unspecified humerus, initial encounter for closed fracture: Secondary | ICD-10-CM | POA: Diagnosis not present

## 2011-02-07 DIAGNOSIS — M329 Systemic lupus erythematosus, unspecified: Secondary | ICD-10-CM | POA: Diagnosis not present

## 2011-02-07 DIAGNOSIS — Z471 Aftercare following joint replacement surgery: Secondary | ICD-10-CM | POA: Diagnosis not present

## 2011-02-07 DIAGNOSIS — E119 Type 2 diabetes mellitus without complications: Secondary | ICD-10-CM | POA: Diagnosis not present

## 2011-02-07 DIAGNOSIS — IMO0001 Reserved for inherently not codable concepts without codable children: Secondary | ICD-10-CM | POA: Diagnosis not present

## 2011-02-07 DIAGNOSIS — Z5189 Encounter for other specified aftercare: Secondary | ICD-10-CM | POA: Diagnosis not present

## 2011-02-07 DIAGNOSIS — M069 Rheumatoid arthritis, unspecified: Secondary | ICD-10-CM | POA: Diagnosis not present

## 2011-02-08 DIAGNOSIS — Z471 Aftercare following joint replacement surgery: Secondary | ICD-10-CM | POA: Diagnosis not present

## 2011-02-08 DIAGNOSIS — IMO0001 Reserved for inherently not codable concepts without codable children: Secondary | ICD-10-CM | POA: Diagnosis not present

## 2011-02-08 DIAGNOSIS — M329 Systemic lupus erythematosus, unspecified: Secondary | ICD-10-CM | POA: Diagnosis not present

## 2011-02-08 DIAGNOSIS — Z5189 Encounter for other specified aftercare: Secondary | ICD-10-CM | POA: Diagnosis not present

## 2011-02-08 DIAGNOSIS — M069 Rheumatoid arthritis, unspecified: Secondary | ICD-10-CM | POA: Diagnosis not present

## 2011-02-08 DIAGNOSIS — E119 Type 2 diabetes mellitus without complications: Secondary | ICD-10-CM | POA: Diagnosis not present

## 2011-02-10 DIAGNOSIS — Z471 Aftercare following joint replacement surgery: Secondary | ICD-10-CM | POA: Diagnosis not present

## 2011-02-10 DIAGNOSIS — E119 Type 2 diabetes mellitus without complications: Secondary | ICD-10-CM | POA: Diagnosis not present

## 2011-02-10 DIAGNOSIS — IMO0001 Reserved for inherently not codable concepts without codable children: Secondary | ICD-10-CM | POA: Diagnosis not present

## 2011-02-10 DIAGNOSIS — M069 Rheumatoid arthritis, unspecified: Secondary | ICD-10-CM | POA: Diagnosis not present

## 2011-02-10 DIAGNOSIS — Z5189 Encounter for other specified aftercare: Secondary | ICD-10-CM | POA: Diagnosis not present

## 2011-02-10 DIAGNOSIS — M329 Systemic lupus erythematosus, unspecified: Secondary | ICD-10-CM | POA: Diagnosis not present

## 2011-02-14 DIAGNOSIS — E119 Type 2 diabetes mellitus without complications: Secondary | ICD-10-CM | POA: Diagnosis not present

## 2011-02-14 DIAGNOSIS — IMO0001 Reserved for inherently not codable concepts without codable children: Secondary | ICD-10-CM | POA: Diagnosis not present

## 2011-02-14 DIAGNOSIS — Z5189 Encounter for other specified aftercare: Secondary | ICD-10-CM | POA: Diagnosis not present

## 2011-02-14 DIAGNOSIS — M329 Systemic lupus erythematosus, unspecified: Secondary | ICD-10-CM | POA: Diagnosis not present

## 2011-02-14 DIAGNOSIS — Z471 Aftercare following joint replacement surgery: Secondary | ICD-10-CM | POA: Diagnosis not present

## 2011-02-14 DIAGNOSIS — M069 Rheumatoid arthritis, unspecified: Secondary | ICD-10-CM | POA: Diagnosis not present

## 2011-02-15 DIAGNOSIS — E119 Type 2 diabetes mellitus without complications: Secondary | ICD-10-CM | POA: Diagnosis not present

## 2011-02-15 DIAGNOSIS — Z5189 Encounter for other specified aftercare: Secondary | ICD-10-CM | POA: Diagnosis not present

## 2011-02-15 DIAGNOSIS — M329 Systemic lupus erythematosus, unspecified: Secondary | ICD-10-CM | POA: Diagnosis not present

## 2011-02-15 DIAGNOSIS — Z471 Aftercare following joint replacement surgery: Secondary | ICD-10-CM | POA: Diagnosis not present

## 2011-02-15 DIAGNOSIS — IMO0001 Reserved for inherently not codable concepts without codable children: Secondary | ICD-10-CM | POA: Diagnosis not present

## 2011-02-15 DIAGNOSIS — M069 Rheumatoid arthritis, unspecified: Secondary | ICD-10-CM | POA: Diagnosis not present

## 2011-02-17 DIAGNOSIS — Z96619 Presence of unspecified artificial shoulder joint: Secondary | ICD-10-CM | POA: Diagnosis not present

## 2011-02-17 DIAGNOSIS — IMO0001 Reserved for inherently not codable concepts without codable children: Secondary | ICD-10-CM | POA: Diagnosis not present

## 2011-02-17 DIAGNOSIS — E119 Type 2 diabetes mellitus without complications: Secondary | ICD-10-CM | POA: Diagnosis not present

## 2011-02-17 DIAGNOSIS — Z5189 Encounter for other specified aftercare: Secondary | ICD-10-CM | POA: Diagnosis not present

## 2011-02-17 DIAGNOSIS — M069 Rheumatoid arthritis, unspecified: Secondary | ICD-10-CM | POA: Diagnosis not present

## 2011-02-17 DIAGNOSIS — Z471 Aftercare following joint replacement surgery: Secondary | ICD-10-CM | POA: Diagnosis not present

## 2011-02-17 DIAGNOSIS — M329 Systemic lupus erythematosus, unspecified: Secondary | ICD-10-CM | POA: Diagnosis not present

## 2011-02-18 DIAGNOSIS — M329 Systemic lupus erythematosus, unspecified: Secondary | ICD-10-CM | POA: Diagnosis not present

## 2011-02-18 DIAGNOSIS — IMO0001 Reserved for inherently not codable concepts without codable children: Secondary | ICD-10-CM | POA: Diagnosis not present

## 2011-02-18 DIAGNOSIS — Z471 Aftercare following joint replacement surgery: Secondary | ICD-10-CM | POA: Diagnosis not present

## 2011-02-18 DIAGNOSIS — M069 Rheumatoid arthritis, unspecified: Secondary | ICD-10-CM | POA: Diagnosis not present

## 2011-02-18 DIAGNOSIS — Z5189 Encounter for other specified aftercare: Secondary | ICD-10-CM | POA: Diagnosis not present

## 2011-02-18 DIAGNOSIS — E119 Type 2 diabetes mellitus without complications: Secondary | ICD-10-CM | POA: Diagnosis not present

## 2011-02-21 DIAGNOSIS — E119 Type 2 diabetes mellitus without complications: Secondary | ICD-10-CM | POA: Diagnosis not present

## 2011-02-21 DIAGNOSIS — M329 Systemic lupus erythematosus, unspecified: Secondary | ICD-10-CM | POA: Diagnosis not present

## 2011-02-21 DIAGNOSIS — IMO0001 Reserved for inherently not codable concepts without codable children: Secondary | ICD-10-CM | POA: Diagnosis not present

## 2011-02-21 DIAGNOSIS — Z471 Aftercare following joint replacement surgery: Secondary | ICD-10-CM | POA: Diagnosis not present

## 2011-02-21 DIAGNOSIS — M069 Rheumatoid arthritis, unspecified: Secondary | ICD-10-CM | POA: Diagnosis not present

## 2011-02-21 DIAGNOSIS — Z5189 Encounter for other specified aftercare: Secondary | ICD-10-CM | POA: Diagnosis not present

## 2011-02-22 DIAGNOSIS — M069 Rheumatoid arthritis, unspecified: Secondary | ICD-10-CM | POA: Diagnosis not present

## 2011-02-22 DIAGNOSIS — M329 Systemic lupus erythematosus, unspecified: Secondary | ICD-10-CM | POA: Diagnosis not present

## 2011-02-22 DIAGNOSIS — Z471 Aftercare following joint replacement surgery: Secondary | ICD-10-CM | POA: Diagnosis not present

## 2011-02-22 DIAGNOSIS — IMO0001 Reserved for inherently not codable concepts without codable children: Secondary | ICD-10-CM | POA: Diagnosis not present

## 2011-02-22 DIAGNOSIS — E119 Type 2 diabetes mellitus without complications: Secondary | ICD-10-CM | POA: Diagnosis not present

## 2011-02-22 DIAGNOSIS — Z5189 Encounter for other specified aftercare: Secondary | ICD-10-CM | POA: Diagnosis not present

## 2011-02-24 DIAGNOSIS — Z5189 Encounter for other specified aftercare: Secondary | ICD-10-CM | POA: Diagnosis not present

## 2011-02-24 DIAGNOSIS — M329 Systemic lupus erythematosus, unspecified: Secondary | ICD-10-CM | POA: Diagnosis not present

## 2011-02-24 DIAGNOSIS — Z471 Aftercare following joint replacement surgery: Secondary | ICD-10-CM | POA: Diagnosis not present

## 2011-02-24 DIAGNOSIS — M069 Rheumatoid arthritis, unspecified: Secondary | ICD-10-CM | POA: Diagnosis not present

## 2011-02-24 DIAGNOSIS — IMO0001 Reserved for inherently not codable concepts without codable children: Secondary | ICD-10-CM | POA: Diagnosis not present

## 2011-02-24 DIAGNOSIS — E119 Type 2 diabetes mellitus without complications: Secondary | ICD-10-CM | POA: Diagnosis not present

## 2011-02-25 DIAGNOSIS — M329 Systemic lupus erythematosus, unspecified: Secondary | ICD-10-CM | POA: Diagnosis not present

## 2011-02-25 DIAGNOSIS — M069 Rheumatoid arthritis, unspecified: Secondary | ICD-10-CM | POA: Diagnosis not present

## 2011-02-25 DIAGNOSIS — Z471 Aftercare following joint replacement surgery: Secondary | ICD-10-CM | POA: Diagnosis not present

## 2011-02-25 DIAGNOSIS — IMO0001 Reserved for inherently not codable concepts without codable children: Secondary | ICD-10-CM | POA: Diagnosis not present

## 2011-02-25 DIAGNOSIS — E119 Type 2 diabetes mellitus without complications: Secondary | ICD-10-CM | POA: Diagnosis not present

## 2011-02-25 DIAGNOSIS — Z5189 Encounter for other specified aftercare: Secondary | ICD-10-CM | POA: Diagnosis not present

## 2011-02-28 DIAGNOSIS — IMO0001 Reserved for inherently not codable concepts without codable children: Secondary | ICD-10-CM | POA: Diagnosis not present

## 2011-02-28 DIAGNOSIS — E119 Type 2 diabetes mellitus without complications: Secondary | ICD-10-CM | POA: Diagnosis not present

## 2011-02-28 DIAGNOSIS — Z5189 Encounter for other specified aftercare: Secondary | ICD-10-CM | POA: Diagnosis not present

## 2011-02-28 DIAGNOSIS — M069 Rheumatoid arthritis, unspecified: Secondary | ICD-10-CM | POA: Diagnosis not present

## 2011-02-28 DIAGNOSIS — Z471 Aftercare following joint replacement surgery: Secondary | ICD-10-CM | POA: Diagnosis not present

## 2011-02-28 DIAGNOSIS — M329 Systemic lupus erythematosus, unspecified: Secondary | ICD-10-CM | POA: Diagnosis not present

## 2011-03-02 DIAGNOSIS — E119 Type 2 diabetes mellitus without complications: Secondary | ICD-10-CM | POA: Diagnosis not present

## 2011-03-02 DIAGNOSIS — Z471 Aftercare following joint replacement surgery: Secondary | ICD-10-CM | POA: Diagnosis not present

## 2011-03-02 DIAGNOSIS — IMO0001 Reserved for inherently not codable concepts without codable children: Secondary | ICD-10-CM | POA: Diagnosis not present

## 2011-03-02 DIAGNOSIS — M069 Rheumatoid arthritis, unspecified: Secondary | ICD-10-CM | POA: Diagnosis not present

## 2011-03-02 DIAGNOSIS — M329 Systemic lupus erythematosus, unspecified: Secondary | ICD-10-CM | POA: Diagnosis not present

## 2011-03-02 DIAGNOSIS — Z5189 Encounter for other specified aftercare: Secondary | ICD-10-CM | POA: Diagnosis not present

## 2011-03-04 DIAGNOSIS — M25519 Pain in unspecified shoulder: Secondary | ICD-10-CM | POA: Diagnosis not present

## 2011-03-07 DIAGNOSIS — Z5189 Encounter for other specified aftercare: Secondary | ICD-10-CM | POA: Diagnosis not present

## 2011-03-07 DIAGNOSIS — E119 Type 2 diabetes mellitus without complications: Secondary | ICD-10-CM | POA: Diagnosis not present

## 2011-03-07 DIAGNOSIS — M329 Systemic lupus erythematosus, unspecified: Secondary | ICD-10-CM | POA: Diagnosis not present

## 2011-03-07 DIAGNOSIS — Z471 Aftercare following joint replacement surgery: Secondary | ICD-10-CM | POA: Diagnosis not present

## 2011-03-07 DIAGNOSIS — M069 Rheumatoid arthritis, unspecified: Secondary | ICD-10-CM | POA: Diagnosis not present

## 2011-03-07 DIAGNOSIS — IMO0001 Reserved for inherently not codable concepts without codable children: Secondary | ICD-10-CM | POA: Diagnosis not present

## 2011-03-11 DIAGNOSIS — IMO0001 Reserved for inherently not codable concepts without codable children: Secondary | ICD-10-CM | POA: Diagnosis not present

## 2011-03-11 DIAGNOSIS — M329 Systemic lupus erythematosus, unspecified: Secondary | ICD-10-CM | POA: Diagnosis not present

## 2011-03-11 DIAGNOSIS — Z5189 Encounter for other specified aftercare: Secondary | ICD-10-CM | POA: Diagnosis not present

## 2011-03-11 DIAGNOSIS — E119 Type 2 diabetes mellitus without complications: Secondary | ICD-10-CM | POA: Diagnosis not present

## 2011-03-11 DIAGNOSIS — Z471 Aftercare following joint replacement surgery: Secondary | ICD-10-CM | POA: Diagnosis not present

## 2011-03-11 DIAGNOSIS — M069 Rheumatoid arthritis, unspecified: Secondary | ICD-10-CM | POA: Diagnosis not present

## 2011-03-15 DIAGNOSIS — M810 Age-related osteoporosis without current pathological fracture: Secondary | ICD-10-CM | POA: Diagnosis not present

## 2011-03-15 DIAGNOSIS — Z79899 Other long term (current) drug therapy: Secondary | ICD-10-CM | POA: Diagnosis not present

## 2011-03-15 DIAGNOSIS — M069 Rheumatoid arthritis, unspecified: Secondary | ICD-10-CM | POA: Diagnosis not present

## 2011-03-15 DIAGNOSIS — M25519 Pain in unspecified shoulder: Secondary | ICD-10-CM | POA: Diagnosis not present

## 2011-03-16 ENCOUNTER — Ambulatory Visit (HOSPITAL_COMMUNITY)
Admission: RE | Admit: 2011-03-16 | Discharge: 2011-03-16 | Disposition: A | Discharge status: Home / Self Care | Payer: Medicare Other | Source: Ambulatory Visit | Attending: Orthopedic Surgery | Admitting: Orthopedic Surgery

## 2011-03-16 DIAGNOSIS — M25519 Pain in unspecified shoulder: Secondary | ICD-10-CM | POA: Insufficient documentation

## 2011-03-16 DIAGNOSIS — M25619 Stiffness of unspecified shoulder, not elsewhere classified: Secondary | ICD-10-CM | POA: Insufficient documentation

## 2011-03-16 DIAGNOSIS — IMO0001 Reserved for inherently not codable concepts without codable children: Secondary | ICD-10-CM | POA: Insufficient documentation

## 2011-03-16 DIAGNOSIS — M6281 Muscle weakness (generalized): Secondary | ICD-10-CM | POA: Insufficient documentation

## 2011-03-16 DIAGNOSIS — E119 Type 2 diabetes mellitus without complications: Secondary | ICD-10-CM | POA: Diagnosis not present

## 2011-03-16 NOTE — Evaluation (Signed)
Physical Therapy Evaluation  Patient Details  Name: [name] MRN: [id number] Date of Birth: 06-06-1940  Today's Date: 03/16/2011 Time: 4098-1191 Time Calculation (min): 43 min Charges: 1 eval Visit#: 1  of 12   Re-eval: 04/15/11 Assessment Diagnosis: R shoulder arthroplasty Surgical Date: 01/22/11 Next MD Visit: 5 weeks Prior Therapy: HHPT for 6 weeks  Past Medical History:  Past Medical History  Diagnosis Date  . Diabetes mellitus   . Lupus     sle  . Fibromyalgia   . RA (rheumatoid arthritis)     ra   Past Surgical History:  Past Surgical History  Procedure Date  . Appendectomy   . Cesarean section x3  . Oophorectomy   . Cataract extraction w/ intraocular lens implant 2010    right eye  . Abdominal hysterectomy   . Left arm surgery     multiple surgeries on  . Trigger finger release yrs ago    right hand  . Knee arthroscopy yrs ago    left knee  . Shoulder hemi-arthroplasty 01/27/2011    Procedure: SHOULDER HEMI-ARTHROPLASTY;  Surgeon: Kathy Paris, MD;  Location: [facility];  Service: Orthopedics;  Laterality: Right;  interscaline right shoulder    Subjective Symptoms/Limitations Symptoms: Pt is a 71 year old female referred to PT s/p R shoulder hemiarthroplasty.  She reports that she fell on 01/12/11 at night time.  She went to the ER and waited until the beginning of the year before she was able to have her shoulder replaced.  Pain Range: 1-6/10 to the top of her R shoulder. NatureLambert Mody pain that goes into a dull pain.  Duration: Constant.  Pt has the most difficulty with her ROM.  She requires assistance for dressing her UE.  She is R handed and is effecting how much she is able to grip.  Pain Assessment Currently in Pain?: Yes Pain Score:   3 Pain Location: Shoulder Pain Orientation: Right Pain Type: Acute pain  Precautions/Restrictions  Precautions Precaution Comments: Referral Reads: Advance to full/PROM/AAROM/AROM as  tolerated  Prior Functioning  Home Living Lives With: [name] Help From: Family Prior Function Leisure: Hobbies-yes (Comment) Comments: Enjoys cooking and spending time with her extended family.  She has had difficulty in the past years with hobbies secondary to Fibromyalgia and RA symptoms.   Cognition/Observation Observation/Other Assessments Observations: Inscision is healing well.  No signs of infection, mild swelling without ecchymosis  Sensation/Coordination/Flexibility/Functional Tests Functional Tests Functional Tests: Upper Extremity Functional Scale: 27/80  Assessment RUE AROM (degrees) Right Shoulder Flexion  0-170: 30 Degrees Right Shoulder ABduction 0-140: 10 Degrees Right Shoulder Internal Rotation  0-70: 20 Degrees Right Shoulder External Rotation  0-90: 10 Degrees RUE PROM (degrees) Right Shoulder Flexion  0-170: 120 Degrees Right Shoulder ABduction 0-140: 85 Degrees Right Shoulder Internal Rotation  0-70: 32 Degrees Right Shoulder External Rotation  0-90: 25 Degrees RUE Strength Right Shoulder Flexion: 2-/5 Right Shoulder Extension: 2-/5 Right Shoulder ABduction: 2-/5 Right Shoulder Internal Rotation: 2/5 Right Shoulder External Rotation: 2/5 Right Elbow Flexion: 3/5 Right Elbow Extension: 3/5 Gross Grasp: Impaired (3/5) Palpation Palpation: Significant muscle spasms to the R UT and fascial restrictions to her periscapular region and deltoid. Posture/Postural Control Posture/Postural Control: Postural limitations Postural Limitations: Forward head posture with rounded shoulders.   Exercise/Treatments Supine Flexion: AAROM;Both;10 reps;Weights;Limitations Shoulder Flexion Weight (lbs): 1 Flexion Limitations: Elbow Flexion 1# x10; Towel Sqeeze (wringing out towel) x10 Seated External Rotation: Right;10 reps;Limitations External Rotation Limitations: On Table 5 sec hold Flexion:  Right;10 reps;Limitations Flexion Limitations: Table Slides 5 sec  hold Abduction: Right;5 reps;Limitations ABduction Limitations: On Table   Physical Therapy Assessment and Plan PT Assessment and Plan Clinical Impression Statement: Pt is a 71 year old female referred to PT s/p R shoulder hemiarthroplasty.  After examination it was found that she has current impairments including increased pain, decreased R shoulder AROM and PROM, decreased R shoulder strength, fascial restrctions and muscular spasms and impaired percieved functional ability which is limiting her from completeing necessary ADL's and IADL's.  Pt will benefit from skilled OPPT in order to address the above impairments to maximize independence and reach funcitonal goals.  Rehab Potential: Good Clinical Impairments Affecting Rehab Potential: Pt is only able to attend 2x/week.  Recommend 3x/week for 12 weeks PT Frequency: Min 3X/week PT Duration: Other (comment) (12 weeks) PT Treatment/Interventions: Therapeutic exercise;Therapeutic activities;Patient/family education;Other (comment) (Manual and modalities for ROM and pain control) PT Plan: UBE, pulleys, supine dowel flexion, ER, IR, star gazers, manual for stretching.     Goals Home Exercise Program Pt will Perform Home Exercise Program: Independently PT Short Term Goals Time to Complete Short Term Goals: 4 weeks PT Short Term Goal 1: Pt will improve her AROM by 5 degrees in each direction.  PT Short Term Goal 2: Pt will report a decrease in pain to 3/10 for 50% during the day.  PT Short Term Goal 3: Pt will improve her R shoulder strength to 3/5. PT Short Term Goal 4: Pt will present with mild fascial resctions. PT Long Term Goals Time to Complete Long Term Goals: 12 weeks PT Long Term Goal 1: Pt will report pain less than or equal to a 2/10 for 75% of her day for improved quality of life.  PT Long Term Goal 2: Pt will improve her UEFS score by 9 points for improved percieved functional ability. Long Term Goal 3: Pt will improve her L  shoulder AROM and strength to [facility] in order to demonstrate 5 counter to cabinet lifts with a 3lb weight to mimic lifting dishware into her cabniets at home Long Term Goal 4: Pt will present without fascial resctrictions and decreased muscular spams to scapular and shoudler region.   Problem List Patient Active Problem List  Diagnoses  . Rheumatoid arthritis  . Shoulder pain  . Stiffness of joint, not elsewhere classified,  shoulder region  . Muscle weakness (generalized)    PT - End of Session Activity Tolerance: Patient tolerated treatment well PT Plan of Care PT Home Exercise Plan: see scanned document.  Educated pt to continue with sling use in the community to decrease risk of injury from others. Encouraged pt to take pain medication before coming into therapy in order to make necessary gains in ROM.  Consulted and Agree with Plan of Care: Patient  GP  Functional Reporting Modifier  Current Status  [phone] - Carrying, Moving and Handling Objects CM - At least 80% but less than 100% impaired, limited or restricted  Goal Status  [phone] - Carrying, Moving and Handling Objects CJ - At least 20% but less than 40% impaired, limited or restricted   Based on UEFS score of 27/80  Corisa Montini 03/16/2011, 11:32 AM  Physician Documentation Your signature is required to indicate approval of the treatment plan as stated above.  Please sign and either send electronically or make a copy of this report for your files and return this physician signed original.   Please mark one 1.__approve of plan  2. ___approve of  plan with the following conditions.   ______________________________                                                          _____________________ Physician Signature                                                                                                             Date

## 2011-03-17 DIAGNOSIS — Z79899 Other long term (current) drug therapy: Secondary | ICD-10-CM | POA: Diagnosis not present

## 2011-03-17 DIAGNOSIS — M069 Rheumatoid arthritis, unspecified: Secondary | ICD-10-CM | POA: Diagnosis not present

## 2011-03-23 ENCOUNTER — Ambulatory Visit (HOSPITAL_COMMUNITY)
Admission: RE | Admit: 2011-03-23 | Discharge: 2011-03-23 | Disposition: A | Discharge status: Home / Self Care | Payer: Medicare Other | Source: Ambulatory Visit | Attending: Internal Medicine | Admitting: Internal Medicine

## 2011-03-23 DIAGNOSIS — IMO0001 Reserved for inherently not codable concepts without codable children: Secondary | ICD-10-CM | POA: Insufficient documentation

## 2011-03-23 DIAGNOSIS — M25519 Pain in unspecified shoulder: Secondary | ICD-10-CM | POA: Insufficient documentation

## 2011-03-23 DIAGNOSIS — M6281 Muscle weakness (generalized): Secondary | ICD-10-CM | POA: Diagnosis not present

## 2011-03-23 DIAGNOSIS — M25619 Stiffness of unspecified shoulder, not elsewhere classified: Secondary | ICD-10-CM | POA: Insufficient documentation

## 2011-03-23 DIAGNOSIS — E119 Type 2 diabetes mellitus without complications: Secondary | ICD-10-CM | POA: Diagnosis not present

## 2011-03-23 NOTE — Progress Notes (Signed)
Physical Therapy Treatment Patient Details  Name: [name] MRN: [id number] Date of Birth: 1940-05-17  Today's Date: 03/23/2011 Time: 5621-3086 Time Calculation (min): 43 min Charges: 30' manual, 13' TE Visit#: 2  of 12   Re-eval: 04/15/11    Subjective: Symptoms/Limitations Symptoms: Pt reports it has been a rough morning.  She is feeling a little ill and stiff all over.  She states that her shoulder is not to bad, just stiff.  Pain Assessment Currently in Pain?: Yes Pain Score:   2  Precautions/Restrictions     Exercise/Treatments  Supine External Rotation: AROM;5 reps;Limitations External Rotation Limitations: Star Gazers w/5 sec hold Flexion: 10 reps;AAROM Shoulder Flexion Weight (lbs): 1 Flexion Limitations: 5 sec hold, Elbow flexion x10 2# Seated Row: Both;10 reps External Rotation: Right;10 reps External Rotation Limitations: On Table 5 sec hold, TC for scapular motion Flexion: Right;10 reps Flexion Limitations: Table Slides 5 sec hold TC for scapular motion Abduction: Right;Limitations;10 reps ABduction Limitations: On Table, TC for scapular motion ROM / Strengthening / Isometric Strengthening UBE (Upper Arm Bike): 3 min @ 1.0 for UE endurance and ROM Rhythmic Stabilization, Supine: 5x 5 sec hold    Manual Therapy Manual Therapy: Other (comment) Other Manual Therapy: PROM w/Grade I-II GH mobs and ossiclations to increase all shoulder ROM.  30 min  Physical Therapy Assessment and Plan PT Assessment and Plan Clinical Impression Statement: Pt had improved ROM after manual techniques.  Notable weakness to scapular and shoulder stabalizers which is increaing spasm to proximal shoulder.  PT Plan: Cont to progress ROM and strength.  Add pulley's next session    Goals    Problem List Patient Active Problem List  Diagnoses  . Rheumatoid arthritis  . Shoulder pain  . Stiffness of joint, not elsewhere classified,  shoulder region  . Muscle weakness  (generalized)   GP No functional reporting required  Madinah Quarry 03/23/2011, 10:56 AM

## 2011-03-25 ENCOUNTER — Ambulatory Visit (HOSPITAL_COMMUNITY)
Admission: RE | Admit: 2011-03-25 | Discharge: 2011-03-25 | Disposition: A | Discharge status: Home / Self Care | Payer: Medicare Other | Source: Ambulatory Visit | Attending: Internal Medicine | Admitting: Internal Medicine

## 2011-03-25 DIAGNOSIS — M6281 Muscle weakness (generalized): Secondary | ICD-10-CM | POA: Diagnosis not present

## 2011-03-25 DIAGNOSIS — M25619 Stiffness of unspecified shoulder, not elsewhere classified: Secondary | ICD-10-CM | POA: Diagnosis not present

## 2011-03-25 DIAGNOSIS — IMO0001 Reserved for inherently not codable concepts without codable children: Secondary | ICD-10-CM | POA: Diagnosis not present

## 2011-03-25 DIAGNOSIS — M25519 Pain in unspecified shoulder: Secondary | ICD-10-CM | POA: Diagnosis not present

## 2011-03-25 DIAGNOSIS — E119 Type 2 diabetes mellitus without complications: Secondary | ICD-10-CM | POA: Diagnosis not present

## 2011-03-25 NOTE — Progress Notes (Signed)
**Note Kathy-Identified via Obfuscation** Physical Therapy Treatment Patient Details  Name: [name] MRN: [id number] Date of Birth: [birth year]  Today's Date: 03/25/2011 Time: 4098-1191 Time Calculation (min): 40 min Charges: 15' manual, 25' TE Visit#: 3  of 12   Re-eval:      Subjective: Symptoms/Limitations Symptoms: has a little soreness this morning. Overall is doing pretty well.  Comes in today with her sling on.  Pain Assessment Currently in Pain?: Yes Pain Score:   2 Pain Location: Shoulder Pain Orientation: Right   Exercise/Treatments Supine Flexion: 15 reps Shoulder Flexion Weight (lbs): 1 ABduction: AROM;Both;10 reps Standing Other Standing Exercises: Theraband: Extension and Rows x10 Each w/grn band Pulleys Flexion: 1 minute ABduction: 1 minute Therapy Ball Flexion: 10 reps ABduction: 10 reps Right/Left: 5 reps ROM / Strengthening / Isometric Strengthening UBE (Upper Arm Bike): 2 min forward/2 min backward 1.0 for UE endurance Rhythmic Stabilization, Supine: 10x5 sec  Manual Therapy Manual Therapy: Other (comment) Other Manual Therapy: PROM w/Grade I-II GH mobs and ossiclations to increase all shoulder ROM. 15 min   Physical Therapy Assessment and Plan PT Assessment and Plan Clinical Impression Statement: Added ball exercises to replace table slides.  She has improved muscular endurance overall.  Contiunes to be limited with her functional ROM and requires min A with ball right/left exercise. PT Plan: Cont to progress ROM, strength and shoulder endurance.  Add wall wash next visit    Goals    Problem List Patient Active Problem List  Diagnoses  . Rheumatoid arthritis  . Shoulder pain  . Stiffness of joint, not elsewhere classified,  shoulder region  . Muscle weakness (generalized)       GP No functional reporting required  [name] 03/25/2011, 9:30 AM

## 2011-03-30 ENCOUNTER — Ambulatory Visit (HOSPITAL_COMMUNITY)
Admission: RE | Admit: 2011-03-30 | Discharge: 2011-03-30 | Disposition: A | Discharge status: Home / Self Care | Payer: Medicare Other | Source: Ambulatory Visit | Attending: Physical Therapy | Admitting: Physical Therapy

## 2011-03-30 DIAGNOSIS — M25519 Pain in unspecified shoulder: Secondary | ICD-10-CM | POA: Diagnosis not present

## 2011-03-30 DIAGNOSIS — IMO0001 Reserved for inherently not codable concepts without codable children: Secondary | ICD-10-CM | POA: Diagnosis not present

## 2011-03-30 DIAGNOSIS — M25619 Stiffness of unspecified shoulder, not elsewhere classified: Secondary | ICD-10-CM | POA: Diagnosis not present

## 2011-03-30 DIAGNOSIS — E119 Type 2 diabetes mellitus without complications: Secondary | ICD-10-CM | POA: Diagnosis not present

## 2011-03-30 DIAGNOSIS — M6281 Muscle weakness (generalized): Secondary | ICD-10-CM | POA: Diagnosis not present

## 2011-03-30 NOTE — Progress Notes (Signed)
Physical Therapy Treatment Patient Details  Name: [name] MRN: [id number] Date of Birth: March 09, 1940  Today's Date: 03/30/2011 Time: 1022-1110 Time Calculation (min): 48 min Visit#: 4  of 12   Re-eval: 04/15/11 Charges:  therex 25', Manual 15'    Subjective: Symptoms/Limitations Symptoms: Pt. states she is having a little discomfort today; 4/10 superior R shouder.  Pt. reports she only wears her sling when she goes out in public to protect it.  Reports compliance with HEP and no questions. Pain Assessment Currently in Pain?: Yes Pain Score:   4 Pain Location: Shoulder Pain Orientation: Right   Exercise/Treatments Supine External Rotation: 10 reps;Weights External Rotation Weight (lbs): 1# Flexion: 15 reps Shoulder Flexion Weight (lbs): 1 Sidelying External Rotation: 10 reps Internal Rotation: Weights Internal Rotation Weight (lbs): 1 ABduction: 10 reps Standing Other Standing Exercises: Theraband: Extension and Rows x10 Each w/grn band Pulleys Flexion: 2 minutes ABduction: 2 minutes Therapy Ball Flexion: 10 reps ABduction: 10 reps Right/Left: 10 reps ROM / Strengthening / Isometric Strengthening UBE (Upper Arm Bike): 4' total; 2'fwd/2'bkw Rhythmic Stabilization, Supine: 10x5 sec      Manual Therapy Manual Therapy: Joint mobilization Other Manual Therapy: PROM  Physical Therapy Assessment and Plan PT Assessment and Plan Clinical Impression Statement: Pt. able to complete ball circles without assistance today.  Added sidelying exercises today and performed joint mobes in sidelying.  Improving ROM and strength exibited with all exercises. PT Plan: Continue to progress ROM and strength.     Problem List Patient Active Problem List  Diagnoses  . Rheumatoid arthritis  . Shoulder pain  . Stiffness of joint, not elsewhere classified,  shoulder region  . Muscle weakness (generalized)    PT - End of Session Activity Tolerance: Patient tolerated  treatment well General Behavior During Session: [facility] for tasks performed Cognition: [facility] for tasks performed    Marcelene Weidemann B. Bascom Levels, PTA 03/30/2011, 11:32 AM

## 2011-04-01 ENCOUNTER — Ambulatory Visit (HOSPITAL_COMMUNITY)
Admission: RE | Admit: 2011-04-01 | Discharge: 2011-04-01 | Disposition: A | Discharge status: Home / Self Care | Payer: Medicare Other | Source: Ambulatory Visit | Attending: Internal Medicine | Admitting: Internal Medicine

## 2011-04-01 DIAGNOSIS — M25519 Pain in unspecified shoulder: Secondary | ICD-10-CM | POA: Diagnosis not present

## 2011-04-01 DIAGNOSIS — E119 Type 2 diabetes mellitus without complications: Secondary | ICD-10-CM | POA: Diagnosis not present

## 2011-04-01 DIAGNOSIS — M25619 Stiffness of unspecified shoulder, not elsewhere classified: Secondary | ICD-10-CM | POA: Diagnosis not present

## 2011-04-01 DIAGNOSIS — IMO0001 Reserved for inherently not codable concepts without codable children: Secondary | ICD-10-CM | POA: Diagnosis not present

## 2011-04-01 DIAGNOSIS — M6281 Muscle weakness (generalized): Secondary | ICD-10-CM | POA: Diagnosis not present

## 2011-04-01 NOTE — Progress Notes (Signed)
Physical Therapy Treatment Patient Details  Name: [name] MRN: [id number] Date of Birth: [birth year]  Today's Date: 04/01/2011 Time: 1020-1058 Time Calculation (min): 38 min Charges: 25' Manual, 10' NMR, 8' There-ex Visit#: 5  of 12   Re-eval: 04/15/11    Subjective: Symptoms/Limitations Symptoms: I am sore today from cutting a cabbage last night.  Pain Assessment Currently in Pain?: Yes Pain Score:   7 (shoulder extension) Pain Location: Shoulder Pain Orientation: Right  Exercise/Treatments Seated Retraction: Both;15 reps;Limitations Retraction Limitations: 10 sec holds after NMR was estabilised to lower and middle trap Sidelying External Rotation: 10 reps Internal Rotation: Limitations Internal Rotation Limitations: 10 sec holds Standing Flexion: AAROM;5 reps;Limitations Flexion Limitations: NMR to correct motor control  ROM / Strengthening / Isometric Strengthening UBE (Upper Arm Bike): 5' forward and backwards   Manual Therapy Manual Therapy: Joint mobilization Joint Mobilization: Grade I-II R GH joint mobs to increase all directions of the shoulder w/PROM afterwards. Other Manual Therapy: NMR for shoulder distraction to encourage RTC muscles and lower and mid trap activation.   Physical Therapy Assessment and Plan PT Assessment and Plan Clinical Impression Statement: Pt reports a significant reduction in pain after manual treament today (1/10 w/shoulder extension).  She had improved neuromuscular control to her shoulder and scapular stabalizers after treatment, however with active flexion, abd and ext she continues to activate UT.  At end of treatment she reports that she wants to contine to improve horizontal adduction. PT Plan: Cont to progress strength, ROM and decrease pain.     Goals    Problem List Patient Active Problem List  Diagnoses  . Rheumatoid arthritis  . Shoulder pain  . Stiffness of joint, not elsewhere classified,  shoulder region  .  Muscle weakness (generalized)    PT - End of Session Activity Tolerance: Patient tolerated treatment well PT Plan of Care PT Home Exercise Plan: Encouraged to continue with shoulder retraction without activating UT.  GP No functional reporting required  Joren Rehm 04/01/2011, 11:08 AM

## 2011-04-06 ENCOUNTER — Ambulatory Visit (HOSPITAL_COMMUNITY)
Admission: RE | Admit: 2011-04-06 | Discharge: 2011-04-06 | Disposition: A | Discharge status: Home / Self Care | Payer: Medicare Other | Source: Ambulatory Visit | Attending: Internal Medicine | Admitting: Internal Medicine

## 2011-04-06 DIAGNOSIS — M25519 Pain in unspecified shoulder: Secondary | ICD-10-CM | POA: Diagnosis not present

## 2011-04-06 DIAGNOSIS — E119 Type 2 diabetes mellitus without complications: Secondary | ICD-10-CM | POA: Diagnosis not present

## 2011-04-06 DIAGNOSIS — M25619 Stiffness of unspecified shoulder, not elsewhere classified: Secondary | ICD-10-CM | POA: Diagnosis not present

## 2011-04-06 DIAGNOSIS — M6281 Muscle weakness (generalized): Secondary | ICD-10-CM | POA: Diagnosis not present

## 2011-04-06 DIAGNOSIS — IMO0001 Reserved for inherently not codable concepts without codable children: Secondary | ICD-10-CM | POA: Diagnosis not present

## 2011-04-06 NOTE — Progress Notes (Signed)
Physical Therapy Treatment Patient Details  Name: [name] MRN: [id number] Date of Birth: 1940/06/19  Today's Date: 04/06/2011 Time: 7829-5621 Time Calculation (min): 47 min Visit#: 6  of 12   Re-eval: 04/15/11  Charge: NMR 10 min therex 30 min Manual 6 min MHP 1 unit   Subjective: Symptoms/Limitations Symptoms: I have been really sore for the past 3 days, pain scale 6/10 R shoulder. Pain Assessment Currently in Pain?: Yes Pain Score:   6 Pain Location: Shoulder Pain Orientation: Right  Objective:   Exercise/Treatments Supine External Rotation: 10 reps;Weights;PROM External Rotation Weight (lbs): 1# External Rotation Limitations: 3x 30" Internal Rotation: PROM Flexion: 15 reps;PROM;Limitations Shoulder Flexion Weight (lbs): 1 Flexion Limitations: 3x 30" ABduction: AROM;10 reps;PROM;Limitations ABduction Limitations: 3x 30" Other Supine Exercises: sidelying/supine exercises complete with MHP to relax mm to increase ROM Other Supine Exercises: NMR for rotator cuff mm to contract for correct musculature x 5 reps Seated Retraction: Both;15 reps;Limitations Retraction Limitations: 10 sec holds after NMR was estabilised to lower and middle trap Sidelying External Rotation: 10 reps;Limitations External Rotation Limitations: 10 second holds ABduction: 10 reps Other Sidelying Exercises: sidelying/supine exercises complete with MHP to relax mm to increase ROM Standing Flexion: AAROM;Right;10 reps;Limitations Flexion Limitations: NMR to correct motor control; tactile cues to stop UT contraction  ROM / Strengthening / Isometric Strengthening UBE (Upper Arm Bike): 5' forward and backwards     Modalities Modalities: Moist Heat Manual Therapy Manual Therapy: Joint mobilization Joint Mobilization: Grade I-II R GH joint mobs to increase all directions of the shoulder w/PROM afterwards Other Manual Therapy: NMR for shoulder distraction to encourage RTC muscles and  lower and mid trap activation Moist Heat Therapy Number Minutes Moist Heat: 15 Minutes Moist Heat Location: Shoulder  Physical Therapy Assessment and Plan PT Assessment and Plan Clinical Impression Statement: NMR with active shoulder flexion, abduction and extension, pt continues to activate UT required tactile cues to utilize correct musculature.  Used MHP through therex today to relax musculature to increase ROM, pain reduced and ROM improved following manual and heat.  Secondary to time pt stated she would ice when get home.   PT Plan: Continue to progress strength, ROM and decrease pain.    Goals    Problem List Patient Active Problem List  Diagnoses  . Rheumatoid arthritis  . Shoulder pain  . Stiffness of joint, not elsewhere classified,  shoulder region  . Muscle weakness (generalized)    PT - End of Session Activity Tolerance: Patient tolerated treatment well General Behavior During Session: [facility] for tasks performed Cognition: [facility] for tasks performed  GP No functional reporting required  Juel Burrow, PTA 04/06/2011, 11:49 AM

## 2011-04-08 ENCOUNTER — Ambulatory Visit (HOSPITAL_COMMUNITY)
Admission: RE | Admit: 2011-04-08 | Discharge: 2011-04-08 | Disposition: A | Discharge status: Home / Self Care | Payer: Medicare Other | Source: Ambulatory Visit | Attending: Family Medicine | Admitting: Family Medicine

## 2011-04-08 DIAGNOSIS — E119 Type 2 diabetes mellitus without complications: Secondary | ICD-10-CM | POA: Diagnosis not present

## 2011-04-08 DIAGNOSIS — IMO0001 Reserved for inherently not codable concepts without codable children: Secondary | ICD-10-CM | POA: Diagnosis not present

## 2011-04-08 DIAGNOSIS — M25619 Stiffness of unspecified shoulder, not elsewhere classified: Secondary | ICD-10-CM | POA: Diagnosis not present

## 2011-04-08 DIAGNOSIS — M25519 Pain in unspecified shoulder: Secondary | ICD-10-CM | POA: Diagnosis not present

## 2011-04-08 DIAGNOSIS — M6281 Muscle weakness (generalized): Secondary | ICD-10-CM | POA: Diagnosis not present

## 2011-04-08 NOTE — Progress Notes (Signed)
Physical Therapy Treatment Patient Details  Name: [name] MRN: [id number] Date of Birth: 1940-10-22  Today's Date: 04/08/2011 Time: 2536-6440 Time Calculation (min): 42 min Visit#: 7  of 12   Re-eval: 04/15/11 Charges:  therex 25', manual 15'    Subjective: Symptoms/Limitations Symptoms: Pt. states her shoulder is feeling better today.  4/10 max pain. Pain Assessment Currently in Pain?: Yes Pain Score:   4 Pain Location: Shoulder Pain Orientation: Right   Exercise/Treatments Supine External Rotation: PROM Internal Rotation: PROM Flexion: PROM ABduction: PROM Seated Elevation: 15 reps Retraction: 15 reps;Both External Rotation: 15 reps;Both Sidelying Other Sidelying Exercises: scapular mobs Standing Flexion: AAROM;Right;10 reps;Limitations Flexion Limitations: NMR to correct motor control; tactile cues to stop UT contraction  Pulleys Flexion: 2 minutes ABduction: 2 minutes Therapy Ball Flexion: 10 reps ABduction: 10 reps Right/Left: 10 reps ROM / Strengthening / Isometric Strengthening UBE (Upper Arm Bike): 6', 3'fwd/3'bkwd    Manual Therapy Other Manual Therapy: Scapular mobs R shoulder/PROM all directions  Physical Therapy Assessment and Plan PT Assessment and Plan Clinical Impression Statement: Pt. was 66' late for appt/unable to complete all exercises.  Pt. required less cues to deactivate UT with therex; added shoulder elevation and performed scapular mobs in sideling. PT Plan: Continue to progress strength, ROM and decrease pain.     Problem List Patient Active Problem List  Diagnoses  . Rheumatoid arthritis  . Shoulder pain  . Stiffness of joint, not elsewhere classified,  shoulder region  . Muscle weakness (generalized)    PT - End of Session Activity Tolerance: Patient tolerated treatment well General Behavior During Session: [facility] for tasks performed Cognition: [facility] for tasks performed   [name] [name] Levels, PTA 04/08/2011, 11:16  AM

## 2011-04-11 ENCOUNTER — Ambulatory Visit (HOSPITAL_COMMUNITY)
Admission: RE | Admit: 2011-04-11 | Discharge: 2011-04-11 | Disposition: A | Discharge status: Home / Self Care | Payer: Medicare Other | Source: Ambulatory Visit | Attending: Internal Medicine | Admitting: Internal Medicine

## 2011-04-11 DIAGNOSIS — M6281 Muscle weakness (generalized): Secondary | ICD-10-CM | POA: Diagnosis not present

## 2011-04-11 DIAGNOSIS — IMO0001 Reserved for inherently not codable concepts without codable children: Secondary | ICD-10-CM | POA: Diagnosis not present

## 2011-04-11 DIAGNOSIS — M25619 Stiffness of unspecified shoulder, not elsewhere classified: Secondary | ICD-10-CM | POA: Diagnosis not present

## 2011-04-11 DIAGNOSIS — E119 Type 2 diabetes mellitus without complications: Secondary | ICD-10-CM | POA: Diagnosis not present

## 2011-04-11 DIAGNOSIS — M25519 Pain in unspecified shoulder: Secondary | ICD-10-CM | POA: Diagnosis not present

## 2011-04-11 NOTE — Progress Notes (Signed)
Physical Therapy Treatment Patient Details  Name: [name] MRN: [id number] Date of Birth: 1940/12/19  Today's Date: 04/11/2011 Time: 4098-1191 Time Calculation (min): 44 min Visit#: 8  of 12   Re-eval: 04/15/11 Charges: Therex x 38'  Subjective: Symptoms/Limitations Symptoms: Pt states that she is not having any pain today. She also reports HEP comliance. Pain Assessment Currently in Pain?: No/denies Pain Score: 0-No pain   Exercise/Treatments Supine External Rotation: PROM Internal Rotation: PROM Flexion: PROM ABduction: PROM Seated Elevation: 15 reps Retraction: 15 reps;Both External Rotation: 15 reps;Both Pulleys Flexion: 2 minutes ABduction: 2 minutes Therapy Ball Flexion: 10 reps ABduction: 10 reps Right/Left: 10 reps ROM / Strengthening / Isometric Strengthening UBE (Upper Arm Bike): 6', 3'fwd/3'bkwd @ 2.0    Manual Therapy Manual Therapy: Joint mobilization Joint Mobilization: Grade I-II R GH joint mobs to increase all directions of the shoulder w/PROM  Physical Therapy Assessment and Plan PT Assessment and Plan Clinical Impression Statement: Pt completes exercises well but does require VC's to avoid UT compensation. PROM completes with grade I-II joint mobs. Pt reports slight increase in pain to 2/10 at end of session. Pt states she will ice shoulder at home. PT Plan: Continue to progress per PT POC. Begin prone exercises next session.     Problem List Patient Active Problem List  Diagnoses  . Rheumatoid arthritis  . Shoulder pain  . Stiffness of joint, not elsewhere classified,  shoulder region  . Muscle weakness (generalized)    PT - End of Session Activity Tolerance: Patient tolerated treatment well General Behavior During Session: [facility] for tasks performed Cognition: [facility] for tasks performed   Seth Bake, PTA 04/11/2011, 11:18 AM

## 2011-04-13 DIAGNOSIS — M25519 Pain in unspecified shoulder: Secondary | ICD-10-CM | POA: Diagnosis not present

## 2011-04-15 ENCOUNTER — Ambulatory Visit (HOSPITAL_COMMUNITY)
Admission: RE | Admit: 2011-04-15 | Discharge: 2011-04-15 | Disposition: A | Discharge status: Home / Self Care | Payer: Medicare Other | Source: Ambulatory Visit | Attending: Internal Medicine | Admitting: Internal Medicine

## 2011-04-15 DIAGNOSIS — E119 Type 2 diabetes mellitus without complications: Secondary | ICD-10-CM | POA: Diagnosis not present

## 2011-04-15 DIAGNOSIS — IMO0001 Reserved for inherently not codable concepts without codable children: Secondary | ICD-10-CM | POA: Diagnosis not present

## 2011-04-15 DIAGNOSIS — M25619 Stiffness of unspecified shoulder, not elsewhere classified: Secondary | ICD-10-CM | POA: Diagnosis not present

## 2011-04-15 DIAGNOSIS — M25519 Pain in unspecified shoulder: Secondary | ICD-10-CM | POA: Diagnosis not present

## 2011-04-15 DIAGNOSIS — M6281 Muscle weakness (generalized): Secondary | ICD-10-CM | POA: Diagnosis not present

## 2011-04-15 NOTE — Progress Notes (Signed)
Physical Therapy Treatment Patient Details  Name: [name] MRN: [id number] Date of Birth: 04/27/1940  Today's Date: 04/15/2011 Time: 1100-1148 Time Calculation (min): 48 min Visit#: 9  of 12   Re-eval: 05/13/11  Charge: manual 15 min therex 27 min  Subjective: Symptoms/Limitations Symptoms: I'm doing okay today, pt brought in script from MD to continues with progressing resistive exercises. Pain Assessment Currently in Pain?: Yes Pain Score:   3 Pain Location: Shoulder Pain Orientation: Right  Objective:   Exercise/Treatments Supine External Rotation: PROM;Limitations External Rotation Weight (lbs): 3x 30" Internal Rotation: PROM;Limitations Internal Rotation Limitations: 3x 30" Flexion: PROM;Limitations Shoulder Flexion Weight (lbs): 3x 30" ABduction: PROM;Limitations ABduction Limitations: 3x 30" Standing External Rotation: Theraband;Limitations;10 reps Theraband Level (Shoulder External Rotation): Level 3 (Green) Flexion: AAROM;Right;10 reps ABduction: AAROM;10 reps Other Standing Exercises: Theraband: Extension and Rows x10 Each w/grn band Other Standing Exercises: counter to cabinet with NMR to correct muscle control, tactile cueing to stop UT contraction, AA to lift 1# ROM / Strengthening / Isometric Strengthening UBE (Upper Arm Bike): 6', 3'fwd/3'bkwd @ 3.0    Physical Therapy Assessment and Plan PT Assessment and Plan Clinical Impression Statement: Annett Fabian, PT began session with stretches/PROM this session.  Began new exercises per PT POC for shoulder ROM and strengtheneing.  Tactile cueing required to avoid UT contractions with all shoulder movements.   PT Plan: Continue progressing per PT POC x4 more weeks per MD script,  PT to send letter with goals/progress to MD for current status.    Goals    Problem List Patient Active Problem List  Diagnoses  . Rheumatoid arthritis  . Shoulder pain  . Stiffness of joint, not elsewhere classified,   shoulder region  . Muscle weakness (generalized)    PT - End of Session Activity Tolerance: Patient tolerated treatment well General Behavior During Session: [facility] for tasks performed Cognition: [facility] for tasks performed  GP No functional reporting required  Juel Burrow, PTA 04/15/2011, 12:26 PM

## 2011-04-20 ENCOUNTER — Ambulatory Visit (HOSPITAL_COMMUNITY)
Admission: RE | Admit: 2011-04-20 | Discharge: 2011-04-20 | Disposition: A | Discharge status: Home / Self Care | Payer: Medicare Other | Source: Ambulatory Visit | Attending: Orthopedic Surgery | Admitting: Orthopedic Surgery

## 2011-04-20 DIAGNOSIS — M25619 Stiffness of unspecified shoulder, not elsewhere classified: Secondary | ICD-10-CM | POA: Insufficient documentation

## 2011-04-20 DIAGNOSIS — E119 Type 2 diabetes mellitus without complications: Secondary | ICD-10-CM | POA: Diagnosis not present

## 2011-04-20 DIAGNOSIS — IMO0001 Reserved for inherently not codable concepts without codable children: Secondary | ICD-10-CM | POA: Insufficient documentation

## 2011-04-20 DIAGNOSIS — M25519 Pain in unspecified shoulder: Secondary | ICD-10-CM | POA: Diagnosis not present

## 2011-04-20 DIAGNOSIS — M6281 Muscle weakness (generalized): Secondary | ICD-10-CM | POA: Insufficient documentation

## 2011-04-20 NOTE — Evaluation (Signed)
Physical Therapy Evaluation  Patient Details  Name: [name] MRN: [id number] Date of Birth: 11/16/1940  Today's Date: 04/20/2011 Time: 1191-4782 Time Calculation (min): 46 min Charges: 1 ROM, 1 MMT, 15' TE, 10' NMR, 10' manual Visit#: 10  of 18   Re-eval: 05/20/11 (G Code: CK, Goal: CJ) Assessment Diagnosis: R shoulder arthroplasty Surgical Date: 01/22/11 Next MD Visit: 06/08/11  Past Medical History:  Past Medical History  Diagnosis Date  . Diabetes mellitus   . Lupus     sle  . Fibromyalgia   . RA (rheumatoid arthritis)     ra   Past Surgical History:  Past Surgical History  Procedure Date  . Appendectomy   . Cesarean section x3  . Oophorectomy   . Cataract extraction w/ intraocular lens implant 2010    right eye  . Abdominal hysterectomy   . Left arm surgery     multiple surgeries on  . Trigger finger release yrs ago    right hand  . Knee arthroscopy yrs ago    left knee  . Shoulder hemi-arthroplasty 01/27/2011    Procedure: SHOULDER HEMI-ARTHROPLASTY;  Surgeon: Mable Paris, MD;  Location: [facility];  Service: Orthopedics;  Laterality: Right;  interscaline right shoulder    Subjective Symptoms/Limitations Symptoms: My shoulder is sore this morning.  I think it is because of the way I am sleeping and my night shirt is pulling on my shoulder the wrong way. Typically my pain is about a 3/10 and worse in the am.  Pain Assessment Currently in Pain?: Yes Pain Score:   5 Assessment RUE AROM (degrees) Right Shoulder Flexion  0-170: 118 Degrees (semireclined position (was 30)) Right Shoulder ABduction 0-140: 80 Degrees (semireclined position (was 10)) Right Shoulder Internal Rotation  0-70: 60 Degrees (semireclined position (was 20)) Right Shoulder External Rotation  0-90: 32 Degrees (semireclined position (was 10)) RUE PROM (degrees) Right Shoulder Flexion  0-170: 140 Degrees (semireclined position (was 120)) Right Shoulder ABduction 0-140: 110  Degrees (semireclined position (was 85)) Right Shoulder Internal Rotation  0-70: 65 Degrees (semireclined position (was 32)) Right Shoulder External Rotation  0-90: 40 Degrees (semireclined position (was 25)) RUE Strength Right Shoulder Flexion: 3/5 (within available range ( was 2-/5)) Right Shoulder Extension: 3/5 (within available range (was 2-/5)) Right Shoulder ABduction: 3/5 (within available range ( was 2-/5)) Right Shoulder Internal Rotation: 3+/5 (within available range ( was 2/5)) Right Shoulder External Rotation: 3+/5 (within available range ( was 2/5)) Right Elbow Flexion: 3+/5 (within available range ( was 3/5)) Right Elbow Extension: 3+/5 (within available range ( was 3/5)) Palpation Palpation: moderate muscle spasm and fascial restrictions throughout scapular and periscapular region  Exercise/Treatments Seated Flexion: Right;10 reps Abduction: AAROM;Right;5 reps (5 sec holds) Other Seated Exercises: D2 Flexion Red band x10 AAROM Standing External Rotation: Right;10 reps;Theraband Theraband Level (Shoulder External Rotation): Level 2 (Red) Internal Rotation: Right;10 reps;Theraband Theraband Level (Shoulder Internal Rotation): Level 2 (Red) Other Standing Exercises: Theraband: Extension 10x5 sec holds and Rows x20 Each w/grn band Other Standing Exercises: counter to cabinet flexion and abdution with NMR to correct muscle control, tactile cueing to stop UT contraction and promote deltoid activation x10 minutes; arm fully extended and elbow bent.  Improved activation with bent elbow ROM / Strengthening / Isometric Strengthening UBE (Upper Arm Bike): 6', 3'fwd/3'bkwd @ 3.0 (to improve muscular endurance)  Manual Therapy Joint Mobilization: Grade II-III GH to improve ER Other Manual Therapy: PROM to increase shoulder ER  Physical Therapy Assessment and Plan PT  Assessment and Plan Clinical Impression Statement: Ms. Nugent has attended 10 OPPT visits over 4 weeks. She has  met 4/5 STG and is progressing towards her LTG.  She will continue to benefit from skilled OP PT in order to address above impairments in order to maximize function and reach goals.  Rehab Potential: Good PT Frequency: Min 2X/week PT Duration: 8 weeks PT Treatment/Interventions: Therapeutic activities;Therapeutic exercise;Neuromuscular re-education;Other (comment) (Manual techniques, joint mobs, modalities PRN) PT Plan: Cont to address functional strength within given AROM    Goals Home Exercise Program Pt will Perform Home Exercise Program: Independently PT Goal: Perform Home Exercise Program - Progress: Met PT Short Term Goals Time to Complete Short Term Goals: 4 weeks PT Short Term Goal 1: Pt will improve her AROM by 5 degrees in each direction.  PT Short Term Goal 1 - Progress: Met PT Short Term Goal 2: Pt will report a decrease in pain to 3/10 for 50% during the day.  PT Short Term Goal 2 - Progress: Met PT Short Term Goal 3: Pt will improve her R shoulder strength to 3/5. PT Short Term Goal 3 - Progress: Met PT Short Term Goal 4: Pt will present with mild fascial resctions. PT Short Term Goal 4 - Progress: Progressing toward goal PT Long Term Goals Time to Complete Long Term Goals: 12 weeks PT Long Term Goal 1: Pt will report pain less than or equal to a 2/10 for 75% of her day for improved quality of life.  PT Long Term Goal 1 - Progress: Progressing toward goal PT Long Term Goal 2: Pt will improve her UEFS score by 9 points for improved percieved functional ability. PT Long Term Goal 2 - Progress: Progressing toward goal Long Term Goal 3: Pt will improve her L shoulder AROM and strength to [facility] in order to demonstrate 5 counter to cabinet lifts with a 3lb weight to mimic lifting dishware into her cabniets at home Long Term Goal 3 Progress: Progressing toward goal Long Term Goal 4: Pt will present without fascial resctrictions and decreased muscular spams to scapular and shoudler  region.  Long Term Goal 4 Progress: Progressing toward goal  Problem List Patient Active Problem List  Diagnoses  . Rheumatoid arthritis  . Shoulder pain  . Stiffness of joint, not elsewhere classified,  shoulder region  . Muscle weakness (generalized)    PT - End of Session Activity Tolerance: Patient tolerated treatment well PT Plan of Care PT Home Exercise Plan: updated with ER, IR, shoulder extension and rows.  Provided pt with red t-band Consulted and Agree with Plan of Care: Patient  GP  Functional Reporting Modifier  Current Status  [phone] - Carrying, Moving and Handling Objects CK - At least 40% but less than 60% impaired, limited or restricted  Goal Status  [phone] - Changing & Maintaing Body Position CJ - At least 20% but less than 40% impaired, limited or restricted   Juelz Whittenberg 04/20/2011, 12:15 PM  Physician Documentation Your signature is required to indicate approval of the treatment plan as stated above.  Please sign and either send electronically or make a copy of this report for your files and return this physician signed original.   Please mark one 1.__approve of plan  2. ___approve of plan with the following conditions.   ______________________________  _____________________ Physician Signature                                                                                                             Date

## 2011-04-22 ENCOUNTER — Inpatient Hospital Stay (HOSPITAL_COMMUNITY): Admission: RE | Admit: 2011-04-22 | Payer: Medicare Other | Source: Ambulatory Visit | Admitting: Physical Therapy

## 2011-04-27 ENCOUNTER — Ambulatory Visit (HOSPITAL_COMMUNITY)
Admission: RE | Admit: 2011-04-27 | Discharge: 2011-04-27 | Disposition: A | Discharge status: Home / Self Care | Payer: Medicare Other | Source: Ambulatory Visit | Attending: Internal Medicine | Admitting: Internal Medicine

## 2011-04-27 DIAGNOSIS — IMO0001 Reserved for inherently not codable concepts without codable children: Secondary | ICD-10-CM | POA: Diagnosis not present

## 2011-04-27 DIAGNOSIS — M25519 Pain in unspecified shoulder: Secondary | ICD-10-CM | POA: Diagnosis not present

## 2011-04-27 DIAGNOSIS — M25619 Stiffness of unspecified shoulder, not elsewhere classified: Secondary | ICD-10-CM | POA: Diagnosis not present

## 2011-04-27 DIAGNOSIS — M6281 Muscle weakness (generalized): Secondary | ICD-10-CM | POA: Diagnosis not present

## 2011-04-27 DIAGNOSIS — E119 Type 2 diabetes mellitus without complications: Secondary | ICD-10-CM | POA: Diagnosis not present

## 2011-04-27 NOTE — Progress Notes (Signed)
Physical Therapy Treatment Patient Details  Name: [name] MRN: [id number] Date of Birth: [birth year]  Today's Date: 04/27/2011 Time: 4696-2952 Time Calculation (min): 48 min Visit#: 11  of 18   Re-eval: 05/20/11 (G Code: CK, Goal: CJ) Assessment Diagnosis: R shoulder arthroplasty Surgical Date: 01/22/11 Next MD Visit: 06/08/11 Charge: therex 38 min Manual 10 min  Subjective: Symptoms/Limitations Symptoms: I drove alone for the first time yesterday and went to the grocery store hardest part was carrying bags into house, pain in lateral aspect of R shoulder scale 2-3/10 Pain Assessment Currently in Pain?: Yes Pain Score:   3 Pain Location: Shoulder Pain Orientation: Right;Lateral  Objective:   Exercise/Treatments Supine External Rotation: PROM;Right;Limitations;AAROM;5 reps External Rotation Weight (lbs): 3x 30", star gazer 10 x10" Internal Rotation: PROM;Right;Limitations;AAROM;5 reps Internal Rotation Limitations: 3x 30" Flexion: PROM;Right;Limitations;AAROM;5 reps Shoulder Flexion Weight (lbs): 3x 30" ABduction: PROM;Right;Limitations;AAROM;5 reps ABduction Limitations: 3x 30" Standing External Rotation: Right;10 reps;Theraband Theraband Level (Shoulder External Rotation): Level 2 (Red) Internal Rotation: Right;10 reps;Theraband Theraband Level (Shoulder Internal Rotation): Level 2 (Red) Flexion: AAROM;Right;10 reps Flexion Limitations: NMR to correct motor control; tactile cues to stop UT contraction  Other Standing Exercises: Theraband: Extension 10x5 sec holds and Rows x20 Each w/grn band Other Standing Exercises: wall climbing with NMR to reduce UT contraction 5 reps flexion and abduction ROM / Strengthening / Isometric Strengthening UBE (Upper Arm Bike): 6', 3'fwd/3'bkwd @ 3.0   Manual Therapy Manual Therapy: Joint mobilization Joint Mobilization: Grade II-III GH to improve ER   Physical Therapy Assessment and Plan PT Assessment and Plan Clinical  Impression Statement: Pt tolerated well towards treatment.  Added new exercises to increase ROM, verbal and tactile cueing required to relax UT with shoulder flexion and abduction.  PT Plan: Cont to address functional strength within given AROM.    Goals    Problem List Patient Active Problem List  Diagnoses  . Rheumatoid arthritis  . Shoulder pain  . Stiffness of joint, not elsewhere classified,  shoulder region  . Muscle weakness (generalized)    PT - End of Session Activity Tolerance: Patient tolerated treatment well General Behavior During Session: [facility] for tasks performed Cognition: [facility] for tasks performed  GP No functional reporting required  Juel Burrow, PTA 04/27/2011, 11:56 AM

## 2011-04-28 DIAGNOSIS — E785 Hyperlipidemia, unspecified: Secondary | ICD-10-CM | POA: Diagnosis not present

## 2011-04-28 DIAGNOSIS — I10 Essential (primary) hypertension: Secondary | ICD-10-CM | POA: Diagnosis not present

## 2011-04-28 DIAGNOSIS — Z6826 Body mass index (BMI) 26.0-26.9, adult: Secondary | ICD-10-CM | POA: Diagnosis not present

## 2011-04-28 DIAGNOSIS — H6120 Impacted cerumen, unspecified ear: Secondary | ICD-10-CM | POA: Diagnosis not present

## 2011-04-28 DIAGNOSIS — B354 Tinea corporis: Secondary | ICD-10-CM | POA: Diagnosis not present

## 2011-04-29 ENCOUNTER — Ambulatory Visit (HOSPITAL_COMMUNITY)
Admission: RE | Admit: 2011-04-29 | Discharge: 2011-04-29 | Disposition: A | Discharge status: Home / Self Care | Payer: Medicare Other | Source: Ambulatory Visit | Attending: Internal Medicine | Admitting: Internal Medicine

## 2011-04-29 DIAGNOSIS — M25619 Stiffness of unspecified shoulder, not elsewhere classified: Secondary | ICD-10-CM | POA: Diagnosis not present

## 2011-04-29 DIAGNOSIS — E119 Type 2 diabetes mellitus without complications: Secondary | ICD-10-CM | POA: Diagnosis not present

## 2011-04-29 DIAGNOSIS — IMO0001 Reserved for inherently not codable concepts without codable children: Secondary | ICD-10-CM | POA: Diagnosis not present

## 2011-04-29 DIAGNOSIS — M6281 Muscle weakness (generalized): Secondary | ICD-10-CM | POA: Diagnosis not present

## 2011-04-29 DIAGNOSIS — M25519 Pain in unspecified shoulder: Secondary | ICD-10-CM | POA: Diagnosis not present

## 2011-04-29 NOTE — Progress Notes (Signed)
Physical Therapy Treatment Patient Details  Name: KADIA ABAYA MRN: [id number] Date of Birth: 26-Apr-1940  Today's Date: 04/29/2011 Time: 1478-2956 Time Calculation (min): 43 min Charges: manual: 8', TE 25 Visit#: 12  of 18   Re-eval: 05/20/11    Subjective: Symptoms/Limitations Symptoms: I am still having a hard time putting things into my cabinet.  Pain Assessment Currently in Pain?: Yes Pain Score:   3 Pain Location: Shoulder Pain Orientation: Right  Precautions/Restrictions     Exercise/Treatments  Supine External Rotation: 20 reps Prone  Extension: 10 reps (5 sec holds) Horizontal ABduction 1: 10 reps;AAROM (5 sec holds w/elbow bent) Other Prone Exercises: Rows x10 x5 sec holds Sidelying External Rotation: Right;10 reps;Weights External Rotation Weight (lbs): 1# wrist weight External Rotation Limitations: 10 sec hold ABduction: 20 reps;Weights ABduction Weight (lbs): 1# wrist weight ROM / Strengthening / Isometric Strengthening UBE (Upper Arm Bike): 6', 3'fwd/3'bkwd @ 3.0 (for activity tolerance) Wall Wash: 1 minute Thumb Tacks: 1 minute    Manual Therapy Manual Therapy: Other (comment) Joint Mobilization: Grade III GH mobs in prone to improve ER and ABD, Grade II-III Scapular to improve scapular rhythm. Other Manual Therapy: PROM in prone to improve scapular stabalization and scapular rthym. STM to decrease subscapularis TP  Physical Therapy Assessment and Plan PT Assessment and Plan Clinical Impression Statement: Cont to have greatest limitation with muscular strength and endurance.  PT Plan: Cont to progress    Goals    Problem List Patient Active Problem List  Diagnoses  . Rheumatoid arthritis  . Shoulder pain  . Stiffness of joint, not elsewhere classified,  shoulder region  . Muscle weakness (generalized)       GP No functional reporting required  Mackay Hanauer 04/29/2011, 2:28 PM

## 2011-05-03 DIAGNOSIS — Z79899 Other long term (current) drug therapy: Secondary | ICD-10-CM | POA: Diagnosis not present

## 2011-05-03 DIAGNOSIS — M25519 Pain in unspecified shoulder: Secondary | ICD-10-CM | POA: Diagnosis not present

## 2011-05-03 DIAGNOSIS — M069 Rheumatoid arthritis, unspecified: Secondary | ICD-10-CM | POA: Diagnosis not present

## 2011-05-04 ENCOUNTER — Ambulatory Visit (HOSPITAL_COMMUNITY)
Admission: RE | Admit: 2011-05-04 | Discharge: 2011-05-04 | Disposition: A | Discharge status: Home / Self Care | Payer: Medicare Other | Source: Ambulatory Visit | Attending: Orthopedic Surgery | Admitting: Orthopedic Surgery

## 2011-05-04 DIAGNOSIS — M6281 Muscle weakness (generalized): Secondary | ICD-10-CM | POA: Diagnosis not present

## 2011-05-04 DIAGNOSIS — IMO0001 Reserved for inherently not codable concepts without codable children: Secondary | ICD-10-CM | POA: Diagnosis not present

## 2011-05-04 DIAGNOSIS — E119 Type 2 diabetes mellitus without complications: Secondary | ICD-10-CM | POA: Diagnosis not present

## 2011-05-04 DIAGNOSIS — M25619 Stiffness of unspecified shoulder, not elsewhere classified: Secondary | ICD-10-CM | POA: Diagnosis not present

## 2011-05-04 DIAGNOSIS — M25519 Pain in unspecified shoulder: Secondary | ICD-10-CM | POA: Diagnosis not present

## 2011-05-04 NOTE — Progress Notes (Signed)
Physical Therapy Treatment Patient Details  Name: [name] MRN: [id number] Date of Birth: January 26, 1940  Today's Date: 05/04/2011 Time: 0272-5366 Time Calculation (min): 45 min Charges: Manual x35', NMR x10 min Visit#: 13  of 18   Re-eval: 05/20/11    Subjective: Symptoms/Limitations Symptoms: I am having so much pain today in my shoulder from trying to sleep on my belly last night.  Pain Assessment Currently in Pain?: Yes Pain Score:   6 Pain Location: Shoulder Pain Orientation: Right  Exercise/Treatments Seated Retraction: 10 reps;Limitations (10 sec holds) Retraction Limitations: NMR to establish appropriate SA, LT and MT activiation.  Performed with chin tuck for appropriate posture Prone  Extension: 10 reps;Limitations (5 sec hold) Extension Limitations: NMR to establish appropraiate motor control Internal Rotation: AAROM;Limitations Internal Rotation Limitations: NMR to improve scapular rthym when reaching behind her back Horizontal ABduction 1: AAROM;5 reps;Limitations Horizontal ABduction 1 Limitations: 5 sec holds after NMR established for appropriate motor control ROM / Strengthening / Isometric Strengthening Prot/Ret//Elev/Dep: 5 reps after NMR was estabilished for appropriate SA, LT and MT involvmement.      Manual Therapy Joint Mobilization: Prone R scapular joint mobs Other Manual Therapy: SCS w/STM afterwards to lateral scapular region to decrease subscapularis, infraspinatus and supraspinatus TP.  TPR to medial UTat C6 insertion.  Visulatzation instruction to increase circulation to RUE to help improve healing process.   Physical Therapy Assessment and Plan PT Assessment and Plan Clinical Impression Statement: Today's treatment goal was to decrease R shoulder pain and improve NM control to serratus anterior and scapular rhythm.  rPt demonstrates extreme weakness to R serratus anterior musculature with poor NM control.  Able to demonstrate prone abd w/AA  today after NM established to decrease unwanted UT involvement.  reported a signifcant decreae in pain at the end of session with decreased palpable fascial restrictions and muscle spasms.  PT Plan: Cont to improve scapular rhythm and decrease pain.     Goals    Problem List Patient Active Problem List  Diagnoses  . Rheumatoid arthritis  . Shoulder pain  . Stiffness of joint, not elsewhere classified,  shoulder region  . Muscle weakness (generalized)       GP No functional reporting required  [name] 05/04/2011, 12:57 PM

## 2011-05-06 ENCOUNTER — Ambulatory Visit (HOSPITAL_COMMUNITY)
Admission: RE | Admit: 2011-05-06 | Discharge: 2011-05-06 | Disposition: A | Discharge status: Home / Self Care | Payer: Medicare Other | Source: Ambulatory Visit | Attending: Internal Medicine | Admitting: Internal Medicine

## 2011-05-06 DIAGNOSIS — IMO0001 Reserved for inherently not codable concepts without codable children: Secondary | ICD-10-CM | POA: Diagnosis not present

## 2011-05-06 DIAGNOSIS — E119 Type 2 diabetes mellitus without complications: Secondary | ICD-10-CM | POA: Diagnosis not present

## 2011-05-06 DIAGNOSIS — M6281 Muscle weakness (generalized): Secondary | ICD-10-CM | POA: Diagnosis not present

## 2011-05-06 DIAGNOSIS — M25619 Stiffness of unspecified shoulder, not elsewhere classified: Secondary | ICD-10-CM | POA: Diagnosis not present

## 2011-05-06 DIAGNOSIS — M25519 Pain in unspecified shoulder: Secondary | ICD-10-CM | POA: Diagnosis not present

## 2011-05-06 NOTE — Progress Notes (Signed)
Physical Therapy Treatment Patient Details  Name: [name] MRN: [id number] Date of Birth: [birth year]  Today's Date: 05/06/2011 Time: 4098-1191 Time Calculation (min): 42 min Charges: 73' NMR Visit#: 14  of 18   Re-eval: 05/20/11    Subjective: Symptoms/Limitations Symptoms: Pt continues to report that she has a significant increase in shoulder pain to her anterior shoulder and lower shoulder blade region.  Pain Assessment Currently in Pain?: Yes Pain Location: Shoulder Pain Orientation: Right  Precautions/Restrictions     Exercise/Treatments Seated Retraction: 10 reps;Limitations Retraction Limitations: NMR to establish appropriate SA, LT and MT activiation.  Performed with chin tuck for appropriate posture Prone  Extension: 10 reps;AAROM (10 sec holds) Extension Limitations: NMR to establish appropraiate motor control Horizontal ABduction 1: AAROM Horizontal ABduction 1 Limitations: 10 sec holds w, resistance placed to for horizontal adduction.  Cueing to improve LT, MT and SA  Other Prone Exercises: Rows x10 x10 sec holds, w/multimodal cueing to improve LT and MT activation (Manual resistance when returning to start position) Sidelying External Rotation: 10 reps;Limitations (10 sec hold) External Rotation Limitations: TC's to improve LT, MT and ER musculature ABduction: 20 reps Standing Other Standing Exercises: Shoulder Rolls forward and backward w/TC to improve scapular positioning 2x10; R arm on wall w/dynadisc under hand to improve NMR to serratus anterior  Other Standing Exercises: Shoulder extension w/manual resistance and TC's to improve scapular mobility 2x10; Shoulder adduction w/manual A to abd and resistance w/adduction x15 ROM / Strengthening / Isometric Strengthening Wall Pushups: 10 reps (w/TC's and VC's for proper motion)    Physical Therapy Assessment and Plan PT Assessment and Plan Clinical Impression Statement: Today's treatment focus was on  decreasing muscle atrophy to scapular region and improve motor control.  Pt has improved posture, decreased pain and improved motor control to SA, LT and MT today.   PT Plan: Cont to address NMR to improve scapular rhtym    Goals    Problem List Patient Active Problem List  Diagnoses  . Rheumatoid arthritis  . Shoulder pain  . Stiffness of joint, not elsewhere classified,  shoulder region  . Muscle weakness (generalized)       GP No functional reporting required  Adelaido Nicklaus 05/06/2011, 12:31 PM

## 2011-05-11 ENCOUNTER — Ambulatory Visit (HOSPITAL_COMMUNITY): Payer: Medicare Other | Admitting: Physical Therapy

## 2011-05-13 ENCOUNTER — Ambulatory Visit (HOSPITAL_COMMUNITY)
Admission: RE | Admit: 2011-05-13 | Discharge: 2011-05-13 | Disposition: A | Discharge status: Home / Self Care | Payer: Medicare Other | Source: Ambulatory Visit | Attending: Internal Medicine | Admitting: Internal Medicine

## 2011-05-13 DIAGNOSIS — M6281 Muscle weakness (generalized): Secondary | ICD-10-CM | POA: Diagnosis not present

## 2011-05-13 DIAGNOSIS — M25619 Stiffness of unspecified shoulder, not elsewhere classified: Secondary | ICD-10-CM | POA: Diagnosis not present

## 2011-05-13 DIAGNOSIS — IMO0001 Reserved for inherently not codable concepts without codable children: Secondary | ICD-10-CM | POA: Diagnosis not present

## 2011-05-13 DIAGNOSIS — E119 Type 2 diabetes mellitus without complications: Secondary | ICD-10-CM | POA: Diagnosis not present

## 2011-05-13 DIAGNOSIS — M25519 Pain in unspecified shoulder: Secondary | ICD-10-CM | POA: Diagnosis not present

## 2011-05-17 ENCOUNTER — Encounter (HOSPITAL_COMMUNITY): Payer: Medicare Other | Attending: Rheumatology

## 2011-05-17 ENCOUNTER — Ambulatory Visit (HOSPITAL_COMMUNITY)
Admission: RE | Admit: 2011-05-17 | Discharge: 2011-05-17 | Disposition: A | Discharge status: Home / Self Care | Payer: Medicare Other | Source: Ambulatory Visit | Attending: Orthopedic Surgery | Admitting: Orthopedic Surgery

## 2011-05-17 VITALS — BP 119/62 | HR 73 | Temp 98.4°F | Wt 154.8 lb

## 2011-05-17 DIAGNOSIS — M6281 Muscle weakness (generalized): Secondary | ICD-10-CM | POA: Diagnosis not present

## 2011-05-17 DIAGNOSIS — M069 Rheumatoid arthritis, unspecified: Secondary | ICD-10-CM | POA: Diagnosis not present

## 2011-05-17 DIAGNOSIS — M25519 Pain in unspecified shoulder: Secondary | ICD-10-CM | POA: Diagnosis not present

## 2011-05-17 DIAGNOSIS — IMO0001 Reserved for inherently not codable concepts without codable children: Secondary | ICD-10-CM | POA: Diagnosis not present

## 2011-05-17 DIAGNOSIS — M25619 Stiffness of unspecified shoulder, not elsewhere classified: Secondary | ICD-10-CM | POA: Diagnosis not present

## 2011-05-17 DIAGNOSIS — E119 Type 2 diabetes mellitus without complications: Secondary | ICD-10-CM | POA: Diagnosis not present

## 2011-05-17 MED ORDER — DIPHENHYDRAMINE HCL 50 MG/ML IJ SOLN
25.0000 mg | Freq: Once | INTRAMUSCULAR | Status: AC
  Administered 2011-05-17: 25 mg via INTRAVENOUS

## 2011-05-17 MED ORDER — SODIUM CHLORIDE 0.9 % IJ SOLN
10.0000 mL | INTRAMUSCULAR | Status: DC | PRN
  Administered 2011-05-17: 10 mL via INTRAVENOUS

## 2011-05-17 MED ORDER — TOCILIZUMAB 400 MG/20ML IV SOLN
320.0000 mg | Freq: Once | INTRAVENOUS | Status: AC
  Administered 2011-05-17: 320 mg via INTRAVENOUS
  Filled 2011-05-17: qty 16

## 2011-05-17 MED ORDER — SODIUM CHLORIDE 0.9 % IV SOLN
INTRAVENOUS | Status: DC
  Administered 2011-05-17: 12:00:00 via INTRAVENOUS

## 2011-05-17 NOTE — Progress Notes (Signed)
Tolerated infusion well. 

## 2011-05-17 NOTE — Progress Notes (Signed)
Physical Therapy Treatment Patient Details  Name: [name] MRN: [id number] Date of Birth: [birth year]  Today's Date: 05/17/2011 Time: 4696-2952 Time Calculation (min): 50 min Charges: 20' TE, 20' NMR, 10' Manual Visit#: 16  of 18   Re-eval: 05/20/11    Subjective: Symptoms/Limitations Symptoms: Reports she is just a little sore today.   Pain Assessment Currently in Pain?: Yes Pain Score:   5 (shoulder horizontal add) Pain Location: Shoulder Pain Orientation: Right Pain Type: Acute pain  Precautions/Restrictions     Exercise/Treatments Seated Elevation: 20 reps;AAROM Horizontal ABduction: AAROM;15 reps (w/5 sec hold into hor add) Other Seated Exercises: Horizontal Adduction stretch 3x30 sec Standing External Rotation: 15 reps Theraband Level (Shoulder External Rotation): Level 3 (Green) Internal Rotation: 15 reps Theraband Level (Shoulder Internal Rotation): Level 3 (Green) Other Standing Exercises: Shoulder Rolls forward and backward w/TC to improve scapular positioning 2x10; Other Standing Exercises: Shoulder extension Grn Tband x10 w/10 sec holds; Corner elbow press 10x10 sec holds w/NMR to SA, MT and LT, Shoulder Adduction Grn x10 ROM / Strengthening / Isometric Strengthening UBE (Upper Arm Bike): 6', 3'fwd/3'bkwd @ 3.0    Manual Therapy Manual Therapy: Joint mobilization Joint Mobilization: Grade III GH to improve shoulder hor. add and ER w/PROM in all directions to follow Myofascial Release: To proximal shoulder region   Physical Therapy Assessment and Plan PT Assessment and Plan Clinical Impression Statement: has significant improvement with SA, MT and LT activiation with less cueing and with improved muscle tone upon visual and tactile inspection.  Continues to have greatest difficulty with shoulder horizontal adduction.  PT Plan: Re-asses next visit    Goals    Problem List Patient Active Problem List  Diagnoses  . Rheumatoid arthritis  .  Shoulder pain  . Stiffness of joint, not elsewhere classified,  shoulder region  . Muscle weakness (generalized)       GP No functional reporting required  Deryk Bozman 05/17/2011, 12:30 PM

## 2011-05-19 ENCOUNTER — Ambulatory Visit (HOSPITAL_COMMUNITY)
Admission: RE | Admit: 2011-05-19 | Discharge: 2011-05-19 | Disposition: A | Discharge status: Home / Self Care | Payer: Medicare Other | Source: Ambulatory Visit | Attending: Internal Medicine | Admitting: Internal Medicine

## 2011-05-19 DIAGNOSIS — M25519 Pain in unspecified shoulder: Secondary | ICD-10-CM | POA: Insufficient documentation

## 2011-05-19 DIAGNOSIS — M6281 Muscle weakness (generalized): Secondary | ICD-10-CM | POA: Insufficient documentation

## 2011-05-19 DIAGNOSIS — IMO0001 Reserved for inherently not codable concepts without codable children: Secondary | ICD-10-CM | POA: Diagnosis not present

## 2011-05-19 DIAGNOSIS — E119 Type 2 diabetes mellitus without complications: Secondary | ICD-10-CM | POA: Insufficient documentation

## 2011-05-19 DIAGNOSIS — M25619 Stiffness of unspecified shoulder, not elsewhere classified: Secondary | ICD-10-CM | POA: Insufficient documentation

## 2011-05-20 NOTE — Progress Notes (Signed)
Physical Therapy Treatment Patient Details  Name: [name] MRN: [id number] Date of Birth: [birth year]  Today's Date: 05/20/2011 Time: 7829-5621 PT Time Calculation (min): 43 min Charges: 25' Manual, 10' TE Visit#: 17  of 18   Re-eval: 05/20/11     Subjective: Symptoms/Limitations Symptoms: States she is having some pain today, especially under her armpit and into her breast area.  Examined pt with shirt off: revealed some warmth and mild redness (may be related to increased muscle soreness) and allodynia to lateral inferior breast tissue.  Recommend pt contact her PCP to discuss.  Pain Assessment Currently in Pain?: Yes Pain Score:   4 Pain Location: Shoulder Pain Orientation: Right Pain Type: Acute pain  Precautions/Restrictions     Exercise/Treatments Supine Horizontal ABduction: 15 reps External Rotation: 15 reps Flexion: 15 reps   Standing External Rotation: 15 reps Theraband Level (Shoulder External Rotation): Level 3 (Green) Internal Rotation: 15 reps Theraband Level (Shoulder Internal Rotation): Level 3 (Green) Other Standing Exercises: Shoulder Rolls forward and backward w/TC to improve scapular positioning 2x10; Other Standing Exercises: Shoulder extension Grn Tband x10 w/10 sec holds  ROM / Strengthening / Isometric Strengthening UBE (Upper Arm Bike): 6', 3'fwd/3'bkwd @ 3.0    Manual Therapy Myofascial Release: To proximal shoulder, scapular, and UT regions to decrease fascial restrctions w/STM after  Physical Therapy Assessment and Plan PT Assessment and Plan Clinical Impression Statement: Treatment focus today was on decreasing overall pain to her shoulder and arm region through use of manual therapy and functional ROM.  PT Plan: Re-assess next visit    Goals    Problem List Patient Active Problem List  Diagnoses  . Rheumatoid arthritis  . Shoulder pain  . Stiffness of joint, not elsewhere classified,  shoulder region  . Muscle weakness  (generalized)       GP No functional reporting required  Emilo Gras 05/20/2011, 12:00 PM

## 2011-05-24 ENCOUNTER — Ambulatory Visit (HOSPITAL_COMMUNITY)
Admission: RE | Admit: 2011-05-24 | Discharge: 2011-05-24 | Disposition: A | Discharge status: Home / Self Care | Payer: Medicare Other | Source: Ambulatory Visit | Attending: Internal Medicine | Admitting: Internal Medicine

## 2011-05-24 NOTE — Evaluation (Signed)
Physical Therapy Re-Evaluation  Patient Details  Name: [name] MRN: [id number] Date of Birth: 1940-04-13  Today's Date: 05/24/2011 Time: 4098-1191 PT Time Calculation (min): 45 min CHARGES: 1 ROM, 1 MMT, 25' TE, 15' Self Care Visit#: 79  of 26   Re-eval: 06/23/11 Assessment Next MD Visit: Dr. Ave Filter - 06/08/11  Authorization: MEDICARE  Authorization Time Period: UPDATE G-Code on Visit 20 to: Current: CJ, Goal: CI  Authorization Visit#: 18  of 20    Past Medical History:  Past Medical History  Diagnosis Date  . Diabetes mellitus   . Lupus     sle  . Fibromyalgia   . RA (rheumatoid arthritis)     ra   Past Surgical History:  Past Surgical History  Procedure Date  . Appendectomy   . Cesarean section x3  . Oophorectomy   . Cataract extraction w/ intraocular lens implant 2010    right eye  . Abdominal hysterectomy   . Left arm surgery     multiple surgeries on  . Trigger finger release yrs ago    right hand  . Knee arthroscopy yrs ago    left knee  . Shoulder hemi-arthroplasty 01/27/2011    Procedure: SHOULDER HEMI-ARTHROPLASTY;  Surgeon: Mable Paris, MD;  Location: [facility];  Service: Orthopedics;  Laterality: Right;  interscaline right shoulder    Subjective Symptoms/Limitations Symptoms: Pt reports she still has greatest difficulty with horizontal adduction (unable to put on deodorant under left arm, or put lotion on left upper arm).  Pain Assessment Currently in Pain?: No/denies  Cognition/Observation Observation/Other Assessments Observations: Improved scapular muscle tone, however still decreased compared to the L  Sensation/Coordination/Flexibility/Functional Tests Coordination Gross Motor Movements are Fluid and Coordinated: No Coordination and Movement Description: Pt with scapular dyskinesis, and over utilization of UT activity.  Functional Tests Functional Tests: UEFS 52/80 (was 27/80)  Assessment RUE AROM (degrees) RUE Overall  AROM Comments: All measurments taken in seated position Right Shoulder Flexion  0-170: 120 Degrees (was 118 in semireclined) Right Shoulder ABduction 0-170: 90 Degrees (was 80 in semireclined) Right Shoulder Internal Rotation  0-90: 60 Degrees (was 60 in semireclined) Right Shoulder External Rotation  0-90: 48 Degrees (was 32 in semireclined) Right Shoulder Horizontal  ADduction 0-130:  (decreased by 90%) RUE PROM (degrees) RUE Overall PROM Comments: Taken in the supine position Right Shoulder Flexion  0-170: 145 Degrees (was 140) Right Shoulder ABduction 0-140: 130 Degrees (was 110) Right Shoulder Internal Rotation  0-90: 70 Degrees (was 65) Right Shoulder External Rotation  0-90: 67 Degrees (was 40) RUE Strength RUE Overall Strength Comments: Measurments taken in available range Right Shoulder Flexion: 3/5 (was 3/5) Right Shoulder Extension: 4/5 (was 3/5) Right Shoulder ABduction: 3/5 (was3/5) Right Shoulder Internal Rotation: 3+/5 (was 3+/5) Right Shoulder External Rotation: 4/5 (was 3+/5) Right Elbow Flexion: 4/5 (was 3+/5) Right Elbow Extension: 5/5 (was 3+/) Palpation Palpation: Mild pain and tenderness to proximal shoulder joint.  Muscle spasms and fascial restrcitons to UT region.   Exercise/Treatments Mobility/Balance      Supine   Seated Other Seated Exercises: Horizontal adduction w/elbow lift x10 w/3 sec hold Prone    Sidelying   Standing Flexion: Right;Weights;Other (comment) (7 reps, counter to cabniet) Shoulder Flexion Weight (lbs): 3 Other Standing Exercises: Shoulder Rolls forward and backward w/TC to improve scapular positioning 20; Shoulder pressess 10x10 sec  Other Standing Exercises: Shoulder extension > elbow flexion > elbow extension > shoulder flexion w/AAROM for full flexion w/5 sec hold x10 Pulleys  Therapy Ball   ROM / Strengthening / Isometric Strengthening     Stretches   Power Physiological scientist    Physical Therapy Assessment and  Plan PT Assessment and Plan Clinical Impression Statement: [name] has attended 56 OPPT visits for a R shoulder hemiarthroplasty.  She has met all of her STG and is progressing well towards her LTG.  She has made signficant improvement with her functional  AROM and has decreased pain overall, however it increases when she complete certain ADL's that cause her to carry weight and lift, secondary to her remaining overall weakness.  She reports her greatest limitiation is her inability to horizontally adduct her shoulder.  Pt will benefit from skilled therapeutic intervention in order to improve on the following deficits: Decreased coordination;Decreased mobility;Decreased strength;Decreased range of motion;Pain Rehab Potential: Good PT Frequency: Min 2X/week PT Duration: 4 weeks PT Treatment/Interventions: Functional mobility training;Therapeutic activities;Therapeutic exercise;Neuromuscular re-education;Patient/family education;Other (comment) (Manual techniques, joint mobilizations, modalities ) PT Plan: UPDATE G-Code in 2 visits to: Current: CJ, Goal: CI:  Manual treatment for horizontal addcution .  Cont with PRE's to improve strength in funcitonal positions, NMR for proper shoulder coordination    Goals Home Exercise Program Pt will Perform Home Exercise Program: Independently PT Short Term Goals Time to Complete Short Term Goals: 4 weeks PT Short Term Goal 1: Pt will improve her AROM by 5 degrees in each direction.  PT Short Term Goal 1 - Progress: Met PT Short Term Goal 2: Pt will report a decrease in pain to 3/10 for 50% during the day.  PT Short Term Goal 2 - Progress: Met PT Short Term Goal 3: Pt will improve her R shoulder strength to 3/5. PT Short Term Goal 3 - Progress: Met PT Short Term Goal 4: Pt will present with mild fascial resctions. PT Short Term Goal 4 - Progress: Met PT Long Term Goals Time to Complete Long Term Goals: 12 weeks PT Long Term Goal 1: Pt will report pain  less than or equal to a 2/10 for 75% of her day for improved quality of life.  PT Long Term Goal 1 - Progress: Met PT Long Term Goal 2: Pt will improve her UEFS score by 9 points for improved percieved functional ability. Long Term Goal 3: Pt will improve her L shoulder AROM and strength to [facility] in order to demonstrate 5 counter to cabinet lifts with a 3lb weight to mimic lifting dishware into her cabniets at home Long Term Goal 3 Progress: Partly met (able to complete w/poor mechanics) Long Term Goal 4: Pt will present without fascial resctrictions and decreased muscular spams to scapular and shoudler region.  Long Term Goal 4 Progress: Met PT Long Term Goal 5: NEW GOAL as of 05/24/11: Pt will improve her horizontal adduction in order to put on her deodorant and lotion to L side.   Problem List Patient Active Problem List  Diagnoses  . Rheumatoid arthritis  . Shoulder pain  . Stiffness of joint, not elsewhere classified,  shoulder region  . Muscle weakness (generalized)   GP  Functional Reporting Modifier  Current Status  [phone] - Carrying, Moving and Handling Objects CJ - At least 20% but less than 40% impaired, limited or restricted  Goal Status  [phone] - Carrying, Moving and Handling Objects CI - At least 1% but less than 20% impaired, limited or restricted   Aalaiyah Yassin 05/24/2011, 9:52 AM  Physician Documentation Your signature is required to indicate approval of  the treatment plan as stated above.  Please sign and either send electronically or make a copy of this report for your files and return this physician signed original.   Please mark one 1.__approve of plan  2. ___approve of plan with the following conditions.   ______________________________                                                          _____________________ Physician Signature                                                                                                             Date

## 2011-05-26 ENCOUNTER — Ambulatory Visit (HOSPITAL_COMMUNITY)
Admission: RE | Admit: 2011-05-26 | Discharge: 2011-05-26 | Disposition: A | Discharge status: Home / Self Care | Payer: Medicare Other | Source: Ambulatory Visit | Attending: Orthopedic Surgery | Admitting: Orthopedic Surgery

## 2011-05-26 NOTE — Progress Notes (Signed)
Physical Therapy Treatment Patient Details  Name: [name] MRN: [id number] Date of Birth: 1940-11-12  Today's Date: 05/26/2011 Time: 3086-5784 PT Time Calculation (min): 30 min Charges: TE x10', Manual x20  Visit#: 19  of 26   Re-eval: 06/23/11    Authorization: MEDICARE  Authorization Time Period: UPDATE G-Code on Visit 20 to: Current: CJ, Goal: CI   Authorization Visit#:   of     Subjective: Symptoms/Limitations Symptoms: Pt reports that she still has stiffnessin her shoulder especially to the top of it.  Pain Assessment Currently in Pain?: Yes Pain Location: Shoulder Pain Orientation: Right;Proximal  Exercise/Treatments Seated Horizontal ABduction: 15 reps;AAROM (AAROM into adduction) Other Seated Exercises: Shoulder backwards rolls x20  ROM / Strengthening / Isometric Strengthening X to V Arms: 10 x Other ROM/Strengthening Exercises: Counter to Cabinet lifts: 10 #1, 10 2#    Manual Therapy Manual Therapy: Joint mobilization (All Manual performed in seated postion) Joint Mobilization: Grade II-IV to lateral clavicle to decrease pain and improve clavicular motion; Grade III-IV to R GH joint to improve shoulder fleixon and ER, Grade II-IV PA mobsto R C5-7 TP, and C5-7 SP and 1st rib Myofascial Release: To R UT and scapular region w/STM after to decrease fascial restrictions.   Physical Therapy Assessment and Plan PT Assessment and Plan Clinical Impression Statement: Pt continues to have greatest limitation with increased fascial restrcitions and pain to UT, scalene and scapular region, which limit her ability to continue with strengthening exercises.   PT Plan: UPDATE G-Code on Visit 20 to: Current: CJ, Goal: CI . Cont to decrease pain and improve function    Goals    Problem List Patient Active Problem List  Diagnoses  . Rheumatoid arthritis  . Shoulder pain  . Stiffness of joint, not elsewhere classified,  shoulder region  . Muscle weakness (generalized)         GP No functional reporting required  Shawnta Schlegel 05/26/2011, 12:01 PM

## 2011-06-06 DIAGNOSIS — E119 Type 2 diabetes mellitus without complications: Secondary | ICD-10-CM | POA: Diagnosis not present

## 2011-06-07 ENCOUNTER — Ambulatory Visit (HOSPITAL_COMMUNITY): Payer: Medicare Other

## 2011-06-07 DIAGNOSIS — IMO0002 Reserved for concepts with insufficient information to code with codable children: Secondary | ICD-10-CM | POA: Diagnosis not present

## 2011-06-07 DIAGNOSIS — M999 Biomechanical lesion, unspecified: Secondary | ICD-10-CM | POA: Diagnosis not present

## 2011-06-08 DIAGNOSIS — M25519 Pain in unspecified shoulder: Secondary | ICD-10-CM | POA: Diagnosis not present

## 2011-06-09 ENCOUNTER — Ambulatory Visit (HOSPITAL_COMMUNITY): Payer: Medicare Other | Admitting: Physical Therapy

## 2011-06-09 ENCOUNTER — Telehealth (HOSPITAL_COMMUNITY): Payer: Self-pay

## 2011-06-09 DIAGNOSIS — IMO0002 Reserved for concepts with insufficient information to code with codable children: Secondary | ICD-10-CM | POA: Diagnosis not present

## 2011-06-09 DIAGNOSIS — M999 Biomechanical lesion, unspecified: Secondary | ICD-10-CM | POA: Diagnosis not present

## 2011-06-14 ENCOUNTER — Emergency Department (HOSPITAL_COMMUNITY)
Admission: EM | Admit: 2011-06-14 | Discharge: 2011-06-14 | Disposition: A | Discharge status: Home / Self Care | Payer: Medicare Other | Attending: Emergency Medicine | Admitting: Emergency Medicine

## 2011-06-14 ENCOUNTER — Emergency Department (HOSPITAL_COMMUNITY): Payer: Medicare Other

## 2011-06-14 ENCOUNTER — Ambulatory Visit (HOSPITAL_COMMUNITY): Payer: Medicare Other | Admitting: Physical Therapy

## 2011-06-14 DIAGNOSIS — N390 Urinary tract infection, site not specified: Secondary | ICD-10-CM | POA: Insufficient documentation

## 2011-06-14 DIAGNOSIS — M79609 Pain in unspecified limb: Secondary | ICD-10-CM | POA: Insufficient documentation

## 2011-06-14 DIAGNOSIS — M069 Rheumatoid arthritis, unspecified: Secondary | ICD-10-CM | POA: Diagnosis not present

## 2011-06-14 DIAGNOSIS — E119 Type 2 diabetes mellitus without complications: Secondary | ICD-10-CM | POA: Diagnosis not present

## 2011-06-14 DIAGNOSIS — M545 Low back pain, unspecified: Secondary | ICD-10-CM | POA: Diagnosis not present

## 2011-06-14 DIAGNOSIS — M329 Systemic lupus erythematosus, unspecified: Secondary | ICD-10-CM | POA: Insufficient documentation

## 2011-06-14 DIAGNOSIS — S335XXA Sprain of ligaments of lumbar spine, initial encounter: Secondary | ICD-10-CM | POA: Insufficient documentation

## 2011-06-14 DIAGNOSIS — X500XXA Overexertion from strenuous movement or load, initial encounter: Secondary | ICD-10-CM | POA: Insufficient documentation

## 2011-06-14 DIAGNOSIS — K7689 Other specified diseases of liver: Secondary | ICD-10-CM | POA: Insufficient documentation

## 2011-06-14 DIAGNOSIS — S39012A Strain of muscle, fascia and tendon of lower back, initial encounter: Secondary | ICD-10-CM

## 2011-06-14 DIAGNOSIS — Z79899 Other long term (current) drug therapy: Secondary | ICD-10-CM | POA: Insufficient documentation

## 2011-06-14 DIAGNOSIS — M5137 Other intervertebral disc degeneration, lumbosacral region: Secondary | ICD-10-CM | POA: Diagnosis not present

## 2011-06-14 DIAGNOSIS — Z6826 Body mass index (BMI) 26.0-26.9, adult: Secondary | ICD-10-CM | POA: Diagnosis not present

## 2011-06-14 DIAGNOSIS — M549 Dorsalgia, unspecified: Secondary | ICD-10-CM | POA: Diagnosis not present

## 2011-06-14 DIAGNOSIS — T50901A Poisoning by unspecified drugs, medicaments and biological substances, accidental (unintentional), initial encounter: Secondary | ICD-10-CM | POA: Diagnosis not present

## 2011-06-14 LAB — URINALYSIS, ROUTINE W REFLEX MICROSCOPIC
Bilirubin Urine: NEGATIVE
Ketones, ur: NEGATIVE mg/dL
Nitrite: POSITIVE — AB
pH: 6 (ref 5.0–8.0)

## 2011-06-14 LAB — URINE MICROSCOPIC-ADD ON

## 2011-06-14 MED ORDER — OXYCODONE-ACETAMINOPHEN 5-325 MG PO TABS: 2.0000 | ORAL_TABLET | ORAL | Status: AC | PRN

## 2011-06-14 MED ORDER — CEPHALEXIN 500 MG PO CAPS: 500.0000 mg | ORAL_CAPSULE | Freq: Four times a day (QID) | ORAL | Status: AC

## 2011-06-14 MED ORDER — OXYCODONE-ACETAMINOPHEN 5-325 MG PO TABS
2.0000 | ORAL_TABLET | Freq: Once | ORAL | Status: AC
  Administered 2011-06-14: 2 via ORAL
  Filled 2011-06-14: qty 2

## 2011-06-14 MED ORDER — DIAZEPAM 5 MG PO TABS
5.0000 mg | ORAL_TABLET | Freq: Once | ORAL | Status: AC
  Administered 2011-06-14: 5 mg via ORAL
  Filled 2011-06-14: qty 1

## 2011-06-14 MED ORDER — DIAZEPAM 5 MG PO TABS: 5.0000 mg | ORAL_TABLET | Freq: Four times a day (QID) | ORAL | Status: AC | PRN

## 2011-06-14 MED ORDER — KETOROLAC TROMETHAMINE 60 MG/2ML IM SOLN
30.0000 mg | Freq: Once | INTRAMUSCULAR | Status: AC
  Administered 2011-06-14: 30 mg via INTRAMUSCULAR
  Filled 2011-06-14: qty 2

## 2011-06-14 NOTE — Discharge Instructions (Signed)
Muscle Strain A muscle strain, or pulled muscle, occurs when a muscle is over-stretched. A small number of muscle fibers may also be torn. This is especially common in athletes. This happens when a sudden violent force placed on a muscle pushes it past its capacity. Usually, recovery from a pulled muscle takes 1 to 2 weeks. But complete healing will take 5 to 6 weeks. There are millions of muscle fibers. Following injury, your body will usually return to normal quickly. HOME CARE INSTRUCTIONS   While awake, apply ice to the sore muscle for 15 to 20 minutes each hour for the first 2 days. Put ice in a plastic bag and place a towel between the bag of ice and your skin.   Do not use the pulled muscle for several days. Do not use the muscle if you have pain.   You may wrap the injured area with an elastic bandage for comfort. Be careful not to bind it too tightly. This may interfere with blood circulation.   Only take over-the-counter or prescription medicines for pain, discomfort, or fever as directed by your caregiver. Do not use aspirin as this will increase bleeding (bruising) at injury site.   Warming up before exercise helps prevent muscle strains.  SEEK MEDICAL CARE IF:  There is increased pain or swelling in the affected area. MAKE SURE YOU:   Understand these instructions.   Will watch your condition.   Will get help right away if you are not doing well or get worse.  Document Released: 01/03/2005 Document Revised: 12/23/2010 Document Reviewed: 08/02/2006 [facility] Patient Information 2012 [place], [place].Urinary Tract Infection Infections of the urinary tract can start in several places. A bladder infection (cystitis), a kidney infection (pyelonephritis), and a prostate infection (prostatitis) are different types of urinary tract infections (UTIs). They usually get better if treated with medicines (antibiotics) that kill germs. Take all the medicine until it is gone. You or your child  may feel better in a few days, but TAKE ALL MEDICINE or the infection may not respond and may become more difficult to treat. HOME CARE INSTRUCTIONS   Drink enough water and fluids to keep the urine clear or pale yellow. Cranberry juice is especially recommended, in addition to large amounts of water.   Avoid caffeine, tea, and carbonated beverages. They tend to irritate the bladder.   Alcohol may irritate the prostate.   Only take over-the-counter or prescription medicines for pain, discomfort, or fever as directed by your caregiver.  To prevent further infections:  Empty the bladder often. Avoid holding urine for long periods of time.   After a bowel movement, women should cleanse from front to back. Use each tissue only once.   Empty the bladder before and after sexual intercourse.  FINDING OUT THE RESULTS OF YOUR TEST Not all test results are available during your visit. If your or your child's test results are not back during the visit, make an appointment with your caregiver to find out the results. Do not assume everything is normal if you have not heard from your caregiver or the medical facility. It is important for you to follow up on all test results. SEEK MEDICAL CARE IF:   There is back pain.   Your baby is older than 3 months with a rectal temperature of 100.5 F (38.1 C) or higher for more than 1 day.   Your or your child's problems (symptoms) are no better in 3 days. Return sooner if you or your child  is getting worse.  SEEK IMMEDIATE MEDICAL CARE IF:   There is severe back pain or lower abdominal pain.   You or your child develops chills.   You have a fever.   Your baby is older than 3 months with a rectal temperature of 102 F (38.9 C) or higher.   Your baby is 56 months old or younger with a rectal temperature of 100.4 F (38 C) or higher.   There is nausea or vomiting.   There is continued burning or discomfort with urination.  MAKE SURE YOU:    Understand these instructions.   Will watch your condition.   Will get help right away if you are not doing well or get worse.  Document Released: 10/13/2004 Document Revised: 12/23/2010 Document Reviewed: 05/18/2006 [facility] Patient Information 2012 [place], [place].

## 2011-06-14 NOTE — ED Notes (Signed)
Was sent to ED from PMD's office for "excruciating" back pain.  Pain began 5 days ago after getting jerked by 2 big dogs when she was walking them down some steps.  Pt able to ambulate now but w/assistance. Pt has bowel and bladder control but states the bladder control is "iffy"

## 2011-06-14 NOTE — ED Provider Notes (Signed)
History     CSN: [id number]  Arrival date & time 06/14/11  1403   First MD Initiated Contact with Patient 06/14/11 1531      Chief Complaint  Patient presents with  . Back Pain    (Consider location/radiation/quality/duration/timing/severity/associated sxs/prior treatment) Patient is a 71 y.o. female presenting with back pain. The history is provided by the patient.  Back Pain    patient here with back pain x5 days after a twisting injury. Pain is located on the left side of her lower back described as sharp and worse with movement. Some radiation down to her left thigh. No change in bowel or bladder function. Patient denies any foot dragging. Saw her Dr. today and sent here for evaluation. She has a prior history of back pain but denies any surgical history  Past Medical History  Diagnosis Date  . Diabetes mellitus   . Lupus     sle  . Fibromyalgia   . RA (rheumatoid arthritis)     ra    Past Surgical History  Procedure Date  . Appendectomy   . Cesarean section x3  . Oophorectomy   . Cataract extraction w/ intraocular lens implant 2010    right eye  . Abdominal hysterectomy   . Left arm surgery     multiple surgeries on  . Trigger finger release yrs ago    right hand  . Knee arthroscopy yrs ago    left knee  . Shoulder hemi-arthroplasty 01/27/2011    Procedure: SHOULDER HEMI-ARTHROPLASTY;  Surgeon: Mable Paris, MD;  Location: [facility];  Service: Orthopedics;  Laterality: Right;  interscaline right shoulder    No family history on file.  History  Substance Use Topics  . Smoking status: Current Everyday Smoker -- 1.0 packs/day for 25 years  . Smokeless tobacco: Not on file  . Alcohol Use: No    OB History    Grav Para Term Preterm Abortions TAB SAB Ect Mult Living                  Review of Systems  Musculoskeletal: Positive for back pain.  All other systems reviewed and are negative.    Allergies  Review of patient's allergies indicates  no known allergies.  Home Medications   Current Outpatient Rx  Name Route Sig Dispense Refill  . ALKA-SELTZER PLUS COLD PO Oral Take by mouth.    Marland Kitchen VITAMIN D 1000 UNITS PO TABS Oral Take 1,000 Units by mouth 2 (two) times daily. Takes 2000 units twice day    . ALKA-SELTZER PLUS ALLERGY PO Oral Take by mouth.    . FOLIC ACID 1 MG PO TABS Oral Take 1 mg by mouth daily.    Marland Kitchen GABAPENTIN 300 MG PO CAPS Oral Take 300 mg by mouth 2 (two) times daily.    Marland Kitchen METFORMIN HCL 500 MG PO TABS Oral Take 500 mg by mouth 2 (two) times daily with a meal.     . METHOTREXATE SODIUM 7.5 MG PO TABS Oral Take 7.5 mg by mouth 2 (two) times a week. Caution" Chemotherapy. Protect from light. Takes on Tuesday and wednesday    . METOPROLOL TARTRATE 25 MG PO TABS Oral Take 25 mg by mouth 2 (two) times daily.     . OXYCODONE-ACETAMINOPHEN 5-500 MG PO CAPS Oral Take 1 capsule by mouth every 4 (four) hours as needed.    Marland Kitchen PREDNISOLONE 5 MG PO TABS Oral Take 5 mg by mouth every morning.     Marland Kitchen  TRAMADOL HCL 50 MG PO TABS Oral Take 50 mg by mouth every 6 (six) hours as needed. Maximum dose= 8 tablets per day      BP 135/43  Pulse 81  Temp(Src) 97.8 F (36.6 C) (Oral)  Resp 16  SpO2 98%  Physical Exam  Nursing note and vitals reviewed. Constitutional: She is oriented to person, place, and time. She appears well-developed and well-nourished.  Non-toxic appearance. No distress.  HENT:  Head: Normocephalic and atraumatic.  Eyes: Conjunctivae, EOM and lids are normal. Pupils are equal, round, and reactive to light.  Neck: Normal range of motion. Neck supple. No tracheal deviation present. No mass present.  Cardiovascular: Normal rate, regular rhythm and normal heart sounds.  Exam reveals no gallop.   No murmur heard. Pulmonary/Chest: Effort normal and breath sounds normal. No stridor. No respiratory distress. She has no decreased breath sounds. She has no wheezes. She has no rhonchi. She has no rales.  Abdominal: Soft.  Normal appearance and bowel sounds are normal. She exhibits no distension. There is no tenderness. There is no rebound and no CVA tenderness.  Musculoskeletal: Normal range of motion. She exhibits no edema and no tenderness.       Left lower lumbar paraspinal muscle pain to palpation  Neurological: She is alert and oriented to person, place, and time. She has normal strength. No cranial nerve deficit or sensory deficit. GCS eye subscore is 4. GCS verbal subscore is 5. GCS motor subscore is 6.  Skin: Skin is warm and dry. No abrasion and no rash noted.  Psychiatric: She has a normal mood and affect. Her speech is normal and behavior is normal.    ED Course  Procedures (including critical care time)  Labs Reviewed - No data to display No results found.   No diagnosis found.    MDM  Pt given pain meds and feels better. Will tx for uti        Toy Baker, MD 06/14/11 2010

## 2011-06-15 ENCOUNTER — Emergency Department (HOSPITAL_COMMUNITY)
Admission: EM | Admit: 2011-06-15 | Discharge: 2011-06-16 | Disposition: A | Discharge status: Home / Self Care | Payer: Medicare Other | Attending: Emergency Medicine | Admitting: Emergency Medicine

## 2011-06-15 ENCOUNTER — Encounter (HOSPITAL_COMMUNITY): Payer: Self-pay

## 2011-06-15 DIAGNOSIS — M549 Dorsalgia, unspecified: Secondary | ICD-10-CM

## 2011-06-15 DIAGNOSIS — E119 Type 2 diabetes mellitus without complications: Secondary | ICD-10-CM | POA: Insufficient documentation

## 2011-06-15 DIAGNOSIS — T50901A Poisoning by unspecified drugs, medicaments and biological substances, accidental (unintentional), initial encounter: Secondary | ICD-10-CM

## 2011-06-15 DIAGNOSIS — Z79899 Other long term (current) drug therapy: Secondary | ICD-10-CM | POA: Insufficient documentation

## 2011-06-15 DIAGNOSIS — M545 Low back pain: Secondary | ICD-10-CM | POA: Diagnosis not present

## 2011-06-15 DIAGNOSIS — IMO0001 Reserved for inherently not codable concepts without codable children: Secondary | ICD-10-CM | POA: Diagnosis not present

## 2011-06-15 DIAGNOSIS — X500XXA Overexertion from strenuous movement or load, initial encounter: Secondary | ICD-10-CM | POA: Insufficient documentation

## 2011-06-15 DIAGNOSIS — M329 Systemic lupus erythematosus, unspecified: Secondary | ICD-10-CM | POA: Diagnosis not present

## 2011-06-15 MED ORDER — HYDROCODONE-ACETAMINOPHEN 5-325 MG PO TABS: 1.0000 | ORAL_TABLET | Freq: Four times a day (QID) | ORAL | Status: DC | PRN

## 2011-06-15 MED ORDER — PREDNISONE 50 MG PO TABS: ORAL_TABLET | ORAL | Status: DC

## 2011-06-15 MED ORDER — SODIUM CHLORIDE 0.9 % IV BOLUS (SEPSIS)
250.0000 mL | Freq: Once | INTRAVENOUS | Status: AC
  Administered 2011-06-15: 23:00:00 via INTRAVENOUS

## 2011-06-15 MED ORDER — SODIUM CHLORIDE 0.9 % IV SOLN
INTRAVENOUS | Status: DC
  Administered 2011-06-15: 23:00:00 via INTRAVENOUS

## 2011-06-15 NOTE — ED Notes (Addendum)
Pt c/o back spasms for 7 days. Pt released from [facility] ED the previous day. Pt seems very drowsy and can not remember how much or what medication was taken. Pt is alert and oriented.

## 2011-06-15 NOTE — ED Provider Notes (Signed)
History    This chart was scribed for Shelda Jakes, MD, MD by Smitty Pluck. The patient was seen in room APA09 and the patient's care was started at 10:33PM.   CSN: [id number]  Arrival date & time 06/15/11  2139   First MD Initiated Contact with Patient 06/15/11 2213      Chief Complaint  Patient presents with  . Back Pain    (Consider location/radiation/quality/duration/timing/severity/associated sxs/prior treatment) The history is provided by the patient and a relative.   [name] is a 71 y.o. female who presents to the Emergency Department complaining of moderate back pain onset 1 days ago. There was "twisting movement" aggravating back pain. Pt was seen at [facility] 1 day ago and was given x-rays with results showing degenerative disease of lumbar spine and UTI. Pt was given abx (Keflex) and pain medication (Percocet) to take home. Pt lives with niece and son believes that pt is being given too much medication at home. Pain has been constant since onset. Pt has hx of lupus and fibromyalgia.  PCP is Dr. Sherwood Gambler  Past Medical History  Diagnosis Date  . Diabetes mellitus   . Lupus     sle  . Fibromyalgia   . RA (rheumatoid arthritis)     ra    Past Surgical History  Procedure Date  . Appendectomy   . Cesarean section x3  . Oophorectomy   . Cataract extraction w/ intraocular lens implant 2010    right eye  . Abdominal hysterectomy   . Left arm surgery     multiple surgeries on  . Trigger finger release yrs ago    right hand  . Knee arthroscopy yrs ago    left knee  . Shoulder hemi-arthroplasty 01/27/2011    Procedure: SHOULDER HEMI-ARTHROPLASTY;  Surgeon: Mable Paris, MD;  Location: [facility];  Service: Orthopedics;  Laterality: Right;  interscaline right shoulder    No family history on file.  History  Substance Use Topics  . Smoking status: Current Everyday Smoker -- 1.0 packs/day for 25 years  . Smokeless tobacco: Not on file  .  Alcohol Use: No    OB History    Grav Para Term Preterm Abortions TAB SAB Ect Mult Living                  Review of Systems  Musculoskeletal: Positive for back pain.  10 Systems reviewed and all are negative for acute change except as noted in the HPI.    Allergies  Review of patient's allergies indicates no known allergies.  Home Medications   Current Outpatient Rx  Name Route Sig Dispense Refill  . ALENDRONATE SODIUM 70 MG PO TABS Oral Take 1 tablet by mouth Once a week.    . CEPHALEXIN 500 MG PO CAPS Oral Take 1 capsule (500 mg total) by mouth 4 (four) times daily. 20 capsule 0  . ALKA-SELTZER PLUS COLD PO Oral Take by mouth.    Marland Kitchen VITAMIN D 1000 UNITS PO TABS Oral Take 1,000 Units by mouth 2 (two) times daily. Takes 2000 units twice day    . DIAZEPAM 5 MG PO TABS Oral Take 1 tablet (5 mg total) by mouth every 6 (six) hours as needed (muscle spasm). 10 tablet 0  . ALKA-SELTZER PLUS ALLERGY PO Oral Take by mouth.    . FOLIC ACID 1 MG PO TABS Oral Take 1 mg by mouth daily.    Marland Kitchen GABAPENTIN 300 MG PO CAPS  Oral Take 300 mg by mouth 2 (two) times daily.    . GLYBURIDE-METFORMIN 5-500 MG PO TABS Oral Take 1 tablet by mouth Twice daily.    Marland Kitchen LISINOPRIL-HYDROCHLOROTHIAZIDE 10-12.5 MG PO TABS Oral Take 1 tablet by mouth Daily.    Marland Kitchen METFORMIN HCL 500 MG PO TABS Oral Take 500 mg by mouth 2 (two) times daily with a meal.     . METHOTREXATE SODIUM 7.5 MG PO TABS Oral Take 7.5 mg by mouth 2 (two) times a week. Caution" Chemotherapy. Protect from light. Takes on Tuesday and wednesday    . METOPROLOL TARTRATE 25 MG PO TABS Oral Take 25 mg by mouth 2 (two) times daily.     . OXYCODONE-ACETAMINOPHEN 5-325 MG PO TABS Oral Take 2 tablets by mouth every 4 (four) hours as needed for pain. 10 tablet 0  . OXYCODONE-ACETAMINOPHEN 5-500 MG PO CAPS Oral Take 1 capsule by mouth every 4 (four) hours as needed.    Marland Kitchen PREDNISONE 5 MG PO TABS Oral Take 5 mg by mouth Daily.    . TRAMADOL HCL 50 MG PO TABS  Oral Take 50 mg by mouth every 6 (six) hours as needed. Maximum dose= 8 tablets per day      BP 155/64  Pulse 98  Temp(Src) 99.4 F (37.4 C) (Oral)  Resp 20  Ht 5\' 3"  (1.6 m)  Wt 151 lb (68.493 kg)  BMI 26.75 kg/m2  SpO2 96%  Physical Exam  Nursing note and vitals reviewed. Constitutional: She is oriented to person, place, and time. She appears well-developed and well-nourished. No distress.  HENT:  Head: Normocephalic and atraumatic.  Mouth/Throat: Mucous membranes are dry.  Eyes:       Pupils are 2mm  Cardiovascular: Normal rate, regular rhythm and normal heart sounds.   No murmur heard. Pulmonary/Chest: Effort normal and breath sounds normal. No respiratory distress. She has no wheezes. She has no rales.  Abdominal: Bowel sounds are normal. She exhibits no distension.  Musculoskeletal:       Right shoulder: She exhibits no spasm.       Cap refill 2 seconds  Lymphadenopathy:    She has no cervical adenopathy.  Neurological: She is alert and oriented to person, place, and time.  Skin: Skin is warm and dry.  Psychiatric: She has a normal mood and affect. Her behavior is normal.    ED Course  Procedures (including critical care time) DIAGNOSTIC STUDIES: Oxygen Saturation is 95% on room air, normal by my interpretation.    COORDINATION OF CARE: 10:46PM EDP discusses pt ED treatment with pt.    Labs Reviewed - No data to display Ct Abdomen Pelvis Wo Contrast  06/14/2011  *RADIOLOGY REPORT*  Clinical Data: Left back pain, twisting injury  CT ABDOMEN AND PELVIS WITHOUT CONTRAST  Technique:  Multidetector CT imaging of the abdomen and pelvis was performed following the standard protocol without intravenous contrast.  Comparison: [facility] CT abdomen pelvis dated 06/11/2007  Findings: Subpleural reticulation/atelectasis in the right lower lobe.  Mild hepatic steatosis.  Spleen, pancreas, and adrenal glands are within normal limits.  Gallbladder is unremarkable.  No  intrahepatic or extrahepatic ductal dilatation.  2.0 cm left renal cyst (series 2/image 36).  Right kidney is within normal limits.  No renal calculi or hydronephrosis.  No evidence of bowel obstruction.  Colonic diverticulosis, without associated inflammatory changes.  Atherosclerotic calcifications of the abdominal aorta and branch vessels. Fusiform ectasia of the infrarenal abdominal aorta (sagittal image 56).  No  abdominopelvic ascites.  No suspicious abdominopelvic lymphadenopathy.  Status post hysterectomy.  No adnexal masses.  Thick-walled bladder, correlate for cystitis.  Degenerative changes of the visualized thoracolumbar spine, most prominent at L5-S1.  No fracture is seen.  IMPRESSION: No renal, ureteral, or bladder calculi.  No hydronephrosis.  Thick-walled bladder, correlate for cystitis.  Mild hepatic steatosis.  Degenerative changes of the visualized thoracolumbar spine, most prominent at L5-S1.  Original Report Authenticated By: Charline Bills, M.D.   Dg Lumbar Spine Complete  06/14/2011  *RADIOLOGY REPORT*  Clinical Data: Back pain  LUMBAR SPINE - COMPLETE 4+ VIEW  Comparison: Lumbar spine MRI - 07/02/2010; abdominal CT - 06/11/2007  Findings: There are five non-rib bearing lumbar type vertebral bodies. Grossly unchanged mild scoliotic curvature of the thoracolumbar spine.  No definite anterolisthesis or retrolisthesis.  No definite pars defects.  Redemonstrated mild multilevel DDD, worse at L5 - S1 and to a lesser extent at L3 - L4 and L4 - L5.  Limited visualization of the bilateral SI joints is normal.  Vascular calcifications.  Surgical clips overlie the left hempelvis.  IMPRESSION:  1.  No acute findings. 2.  Redemonstrated multilevel DDD, worse at L5 - S1 to a lesser extent, L3 - L4 and L4 - L5.  Original Report Authenticated By: Waynard Reeds, M.D.     1. Back pain   2. Overdose drug       MDM  Patient seen at [facility] yesterday for neck pain significant workup including  x-rays a lumbar area and CT scan noting significant degenerative changes in the lower part of the back as noted above. Also laboratory workup was consistent with urinary tract infection. Patient was sent home with Valium Percocet and Keflex for the urinary tract infection. Patient was brought in by him the limits for worsening back pain however when son saw patient she seemed to be slurring her speech and seemed to be somewhat. Patient denies taking extra pain medicines as well as concern that she had done that. Patient does have a history of fibromyalgia. Here in the emergency partner patient was observed slurred speech is resolved. Patient's cardiac monitor pulse ox is remain normal pulse ox is 94% on room air. Patient's pain is under reasonable control. Told patient that probably needs take 1 of the Percocet tablets every 6 hours if she's taking with Valium and can add on additional Percocet as needed for additional pain. Objective is to take the edge of the pain up to completely resolve the pain. Patient has no sniffing or neurological deficits or lower extremity focal findings. Patient denies taking a large amount of the medication.  I personally performed the services described in this documentation, which was scribed in my presence. The recorded information has been reviewed and considered.         Shelda Jakes, MD 06/16/11 [phone]

## 2011-06-16 ENCOUNTER — Ambulatory Visit (HOSPITAL_COMMUNITY): Payer: Medicare Other

## 2011-06-16 LAB — URINE CULTURE
Colony Count: >=100000
Culture  Setup Time: 201305290346

## 2011-06-16 NOTE — Discharge Instructions (Signed)
Probably would not need further pain medicine for 4 hours. Would recommend taking one one of the Percocet every 6 hours with supplement with a second tablet as needed for additional pain. Continue take her antibiotic for urinary tract infection. Followup with your regular Dr. In the next 2 days. Return for new or worse symptoms.

## 2011-06-16 NOTE — ED Notes (Signed)
Discharge instructions reviewed with pt, questions answered. Pt verbalized understanding.  

## 2011-06-17 NOTE — ED Notes (Signed)
+  Urine. Patient treated with Keflex. Sensitive to same. Per protocol MD. °

## 2011-06-20 DIAGNOSIS — K625 Hemorrhage of anus and rectum: Secondary | ICD-10-CM | POA: Diagnosis not present

## 2011-06-20 DIAGNOSIS — J209 Acute bronchitis, unspecified: Secondary | ICD-10-CM | POA: Diagnosis not present

## 2011-06-20 DIAGNOSIS — E119 Type 2 diabetes mellitus without complications: Secondary | ICD-10-CM | POA: Diagnosis not present

## 2011-06-21 DIAGNOSIS — K625 Hemorrhage of anus and rectum: Secondary | ICD-10-CM | POA: Diagnosis not present

## 2011-06-23 ENCOUNTER — Encounter (INDEPENDENT_AMBULATORY_CARE_PROVIDER_SITE_OTHER): Payer: Self-pay | Admitting: *Deleted

## 2011-06-29 ENCOUNTER — Ambulatory Visit (INDEPENDENT_AMBULATORY_CARE_PROVIDER_SITE_OTHER): Payer: Medicare Other | Admitting: Internal Medicine

## 2011-06-30 ENCOUNTER — Other Ambulatory Visit (HOSPITAL_COMMUNITY): Payer: Self-pay | Admitting: "Endocrinology

## 2011-06-30 DIAGNOSIS — E049 Nontoxic goiter, unspecified: Secondary | ICD-10-CM

## 2011-06-30 DIAGNOSIS — E0590 Thyrotoxicosis, unspecified without thyrotoxic crisis or storm: Secondary | ICD-10-CM | POA: Diagnosis not present

## 2011-07-04 ENCOUNTER — Ambulatory Visit (HOSPITAL_COMMUNITY)
Admission: RE | Admit: 2011-07-04 | Discharge: 2011-07-04 | Disposition: A | Discharge status: Home / Self Care | Payer: Medicare Other | Source: Ambulatory Visit | Attending: "Endocrinology | Admitting: "Endocrinology

## 2011-07-04 DIAGNOSIS — E049 Nontoxic goiter, unspecified: Secondary | ICD-10-CM | POA: Diagnosis not present

## 2011-07-04 DIAGNOSIS — E042 Nontoxic multinodular goiter: Secondary | ICD-10-CM | POA: Diagnosis not present

## 2011-07-05 ENCOUNTER — Other Ambulatory Visit (HOSPITAL_COMMUNITY): Payer: Self-pay | Admitting: "Endocrinology

## 2011-07-05 DIAGNOSIS — E042 Nontoxic multinodular goiter: Secondary | ICD-10-CM

## 2011-07-05 DIAGNOSIS — E041 Nontoxic single thyroid nodule: Secondary | ICD-10-CM

## 2011-07-06 ENCOUNTER — Telehealth (INDEPENDENT_AMBULATORY_CARE_PROVIDER_SITE_OTHER): Payer: Self-pay | Admitting: *Deleted

## 2011-07-06 ENCOUNTER — Other Ambulatory Visit (INDEPENDENT_AMBULATORY_CARE_PROVIDER_SITE_OTHER): Payer: Self-pay | Admitting: *Deleted

## 2011-07-06 ENCOUNTER — Ambulatory Visit (INDEPENDENT_AMBULATORY_CARE_PROVIDER_SITE_OTHER): Payer: Medicare Other | Admitting: Internal Medicine

## 2011-07-06 ENCOUNTER — Encounter (INDEPENDENT_AMBULATORY_CARE_PROVIDER_SITE_OTHER): Payer: Self-pay | Admitting: Internal Medicine

## 2011-07-06 ENCOUNTER — Telehealth: Payer: Self-pay

## 2011-07-06 VITALS — BP 94/50 | HR 72 | Temp 97.8°F | Ht 63.5 in | Wt 151.9 lb

## 2011-07-06 DIAGNOSIS — K625 Hemorrhage of anus and rectum: Secondary | ICD-10-CM

## 2011-07-06 DIAGNOSIS — Z1211 Encounter for screening for malignant neoplasm of colon: Secondary | ICD-10-CM

## 2011-07-06 DIAGNOSIS — R634 Abnormal weight loss: Secondary | ICD-10-CM

## 2011-07-06 NOTE — Patient Instructions (Addendum)
Colonoscopy with Dr. Rehman. The risks and benefits such as perforation, bleeding, and infection were reviewed with the patient and is agreeable. 

## 2011-07-06 NOTE — Telephone Encounter (Signed)
MICHELLE STATES HER MOM SEES DR TRUSLOW AND HE IS OUT OF THE OFFICE THIS WEEK, HER MOM IS IN NEED OF HER NEURONTIN AND HYDROCODONE  PLEASE CALL [phone]   [place] [facility]

## 2011-07-06 NOTE — Telephone Encounter (Signed)
Patient needs movi prep 

## 2011-07-06 NOTE — Progress Notes (Signed)
Subjective:     Patient ID: [name], female   DOB: 06/29/1940, 70 y.o.   MRN: [id number]  HPI Referred by Dr. Sherwood Gambler with rectal bleedingcolonoscopy. She apparently had rectal bleeding for a couple of days.  She describes as a very small amount. She saw the blood on the stool.  She had a fever. No antibiotics before the rectal bleeding. She was having two stools a day. She denies prior hx of rectal bleeding. Appetite is not good. She does not feel like eating.  She says she has lost about 15 pounds over 2 1/2 months. No abdominal pain. No melena.   Her last colonoscopy in 2008  yrs ago per patient was normal. (Dr. Jena Gauss)   Review of Systems see hpi Current Outpatient Prescriptions  Medication Sig Dispense Refill  . alendronate (FOSAMAX) 70 MG tablet Take 1 tablet by mouth Once a week.      . Chlorphen-Phenyleph-ASA (ALKA-SELTZER PLUS COLD PO) Take by mouth.      . cholecalciferol (VITAMIN D) 1000 UNITS tablet Take 1,000 Units by mouth 2 (two) times daily. Takes 2000 units twice day      . DiphenhydrAMINE HCl (ALKA-SELTZER PLUS ALLERGY PO) Take by mouth.      . folic acid (FOLVITE) 1 MG tablet Take 1 mg by mouth daily.      Marland Kitchen gabapentin (NEURONTIN) 300 MG capsule Take 300 mg by mouth 2 (two) times daily.      Marland Kitchen glyBURIDE-metformin (GLUCOVANCE) 5-500 MG per tablet Take 1 tablet by mouth Twice daily.      Marland Kitchen lisinopril-hydrochlorothiazide (PRINZIDE,ZESTORETIC) 10-12.5 MG per tablet Take 1 tablet by mouth Daily.      . methotrexate (RHEUMATREX) 7.5 MG tablet Take 7.5 mg by mouth 2 (two) times a week. Caution" Chemotherapy. Protect from light. Takes on Tuesday and wednesday      . metoprolol tartrate (LOPRESSOR) 25 MG tablet Take 25 mg by mouth 2 (two) times daily.       Marland Kitchen oxyCODONE-acetaminophen (TYLOX) 5-500 MG per capsule Take 1 capsule by mouth every 4 (four) hours as needed.      . predniSONE (DELTASONE) 5 MG tablet Take 5 mg by mouth Daily.      . promethazine (PHENERGAN) 25 MG  tablet Take 25 mg by mouth every 6 (six) hours as needed.      . traMADol (ULTRAM) 50 MG tablet Take 50 mg by mouth every 6 (six) hours as needed. Maximum dose= 8 tablets per day      . metFORMIN (GLUCOPHAGE) 500 MG tablet Take 500 mg by mouth 2 (two) times daily with a meal.        No current facility-administered medications for this visit.   Facility-Administered Medications Ordered in Other Visits  Medication Dose Route Frequency Provider Last Rate Last Dose  . 0.9 %  sodium chloride infusion   Intravenous Continuous Donnal Moat, MD 20 mL/hr at 10/04/10 1339     Past Medical History  Diagnosis Date  . Diabetes mellitus     x 20 yrs  . Lupus     sle  . Fibromyalgia   . RA (rheumatoid arthritis)     ra   Past Surgical History  Procedure Date  . Appendectomy   . Cesarean section x3  . Oophorectomy   . Cataract extraction w/ intraocular lens implant 2010    right eye  . Abdominal hysterectomy   . Left arm surgery     multiple surgeries on  .  Trigger finger release yrs ago    right hand  . Knee arthroscopy yrs ago    left knee  . Shoulder hemi-arthroplasty 01/27/2011    Procedure: SHOULDER HEMI-ARTHROPLASTY;  Surgeon: Mable Paris, MD;  Location: [facility];  Service: Orthopedics;  Laterality: Right;  interscaline right shoulder   History   Social History  . Marital Status: Widowed    Spouse Name: N/A    Number of Children: N/A  . Years of Education: N/A   Occupational History  . Not on file.   Social History Main Topics  . Smoking status: Current Everyday Smoker -- 1.0 packs/day for 25 years  . Smokeless tobacco: Not on file   Comment: 2 packs a day x 30 yrs  . Alcohol Use: No  . Drug Use: No  . Sexually Active: Not on file   Other Topics Concern  . Not on file   Social History Narrative  . No narrative on file   Family Status  Relation Status Death Age  . Mother Alive     good health  . Father Deceased     suicide  . Brother Alive       goodl health   No Known Allergies      Objective:   Physical Exam Filed Vitals:   07/06/11 1117  Height: 5' 3.5" (1.613 m)  Weight: 151 lb 14.4 oz (68.901 kg)  Alert and oriented. Skin warm and dry. Oral mucosa is moist.   . Sclera anicteric, conjunctivae is pink. Thyroid not enlarged. No cervical lymphadenopathy. Lungs clear. Heart regular rate and rhythm.  Abdomen is soft. Bowel sounds are positive. No hepatomegaly. No abdominal masses felt. No tenderness.Stool brown and guaiac negative.  No edema to lower extremities.   06/06/2011 HA1C 6.4 Stool culture negative for salmonella, shigella, campylobacter, Yersinia or E. Colli. H and H 12.5 and 36.4, Platelet ct 175 NA 133, K 4.1, Chloride 96, Glucose 152, BUN 19, Creatinine 0.83, Total bili 1.0, ALP 80, AST 25, ALT 22, Total protein 7.2, Albumin 3.9, Calcium 8.9     Assessment:   Rectal bleeding resolved. Weight loss. Colonic neoplasm needs to be ruled. Polyp/ulcer is also in the differential.    Plan:    Colonoscopy.    The risks and benefits such as perforation, bleeding, and infection were reviewed with the patient and is agreeable.

## 2011-07-07 ENCOUNTER — Encounter (HOSPITAL_COMMUNITY)
Admission: RE | Admit: 2011-07-07 | Discharge: 2011-07-07 | Disposition: A | Discharge status: Home / Self Care | Payer: Medicare Other | Source: Ambulatory Visit | Attending: "Endocrinology | Admitting: "Endocrinology

## 2011-07-07 ENCOUNTER — Encounter (HOSPITAL_COMMUNITY): Payer: Self-pay

## 2011-07-07 DIAGNOSIS — E042 Nontoxic multinodular goiter: Secondary | ICD-10-CM

## 2011-07-07 MED ORDER — SODIUM IODIDE I 131 CAPSULE
6.0000 | Freq: Once | INTRAVENOUS | Status: AC | PRN
  Administered 2011-07-07: 6 via ORAL

## 2011-07-07 NOTE — Telephone Encounter (Signed)
Please forward to an MD to address.

## 2011-07-07 NOTE — Telephone Encounter (Signed)
Granddaughter, Marcelino Duster,  called again wants [place] to call these 2 meds in for her grandmother.

## 2011-07-07 NOTE — Telephone Encounter (Signed)
Marcelino Duster called again stating her grandmother needs these medications and her doctor is out of the office. Cell [phone] Second cell is [phone]

## 2011-07-08 ENCOUNTER — Encounter (INDEPENDENT_AMBULATORY_CARE_PROVIDER_SITE_OTHER): Payer: Self-pay

## 2011-07-08 ENCOUNTER — Encounter (HOSPITAL_COMMUNITY)
Admission: RE | Admit: 2011-07-08 | Discharge: 2011-07-08 | Disposition: A | Discharge status: Home / Self Care | Payer: Medicare Other | Source: Ambulatory Visit | Attending: "Endocrinology | Admitting: "Endocrinology

## 2011-07-08 DIAGNOSIS — E042 Nontoxic multinodular goiter: Secondary | ICD-10-CM | POA: Diagnosis not present

## 2011-07-08 DIAGNOSIS — R634 Abnormal weight loss: Secondary | ICD-10-CM | POA: Diagnosis not present

## 2011-07-08 DIAGNOSIS — R42 Dizziness and giddiness: Secondary | ICD-10-CM | POA: Insufficient documentation

## 2011-07-08 DIAGNOSIS — F39 Unspecified mood [affective] disorder: Secondary | ICD-10-CM | POA: Insufficient documentation

## 2011-07-08 DIAGNOSIS — R450 Nervousness: Secondary | ICD-10-CM | POA: Diagnosis not present

## 2011-07-08 MED ORDER — SODIUM PERTECHNETATE TC 99M INJECTION
10.0000 | Freq: Once | INTRAVENOUS | Status: AC | PRN
  Administered 2011-07-08: 10 via INTRAVENOUS

## 2011-07-08 MED ORDER — PEG-KCL-NACL-NASULF-NA ASC-C 100 G PO SOLR: 1.0000 | Freq: Once | ORAL | Status: DC

## 2011-07-08 NOTE — Telephone Encounter (Signed)
It is okay to fill the Neurontin and hydrocodone for one month

## 2011-07-08 NOTE — Telephone Encounter (Signed)
Verified w/pt's pharmacy that pt has been taking both of these medications from Dr Kellie Simmering and that she is due for RFs and gave info to Dr Cleta Alberts who then OKd RFs. Gave [facility] OK to RF Hydrocodone 5/500 BID #60, and Neurontin 300 BID #60 for pt per Dr Cleta Alberts. LMOM for granddaughter at both cell #s to [facility]

## 2011-07-08 NOTE — Telephone Encounter (Signed)
Pt would like for [place] to put a rush on her grandmothers medications she has been out for 4 days rx is for neurontin and hydrocodone

## 2011-07-08 NOTE — Telephone Encounter (Signed)
Dr Cleta Alberts, can you address this?

## 2011-07-12 ENCOUNTER — Other Ambulatory Visit (HOSPITAL_COMMUNITY): Payer: Self-pay | Admitting: "Endocrinology

## 2011-07-12 ENCOUNTER — Ambulatory Visit (HOSPITAL_COMMUNITY)
Admission: RE | Admit: 2011-07-12 | Discharge: 2011-07-12 | Disposition: A | Discharge status: Home / Self Care | Payer: Medicare Other | Source: Ambulatory Visit | Attending: "Endocrinology | Admitting: "Endocrinology

## 2011-07-12 DIAGNOSIS — E041 Nontoxic single thyroid nodule: Secondary | ICD-10-CM

## 2011-07-14 ENCOUNTER — Ambulatory Visit (HOSPITAL_COMMUNITY): Payer: Medicare Other

## 2011-07-19 DIAGNOSIS — M069 Rheumatoid arthritis, unspecified: Secondary | ICD-10-CM | POA: Diagnosis not present

## 2011-07-19 DIAGNOSIS — Z79899 Other long term (current) drug therapy: Secondary | ICD-10-CM | POA: Diagnosis not present

## 2011-07-19 DIAGNOSIS — E0590 Thyrotoxicosis, unspecified without thyrotoxic crisis or storm: Secondary | ICD-10-CM | POA: Diagnosis not present

## 2011-07-25 DIAGNOSIS — Z79899 Other long term (current) drug therapy: Secondary | ICD-10-CM | POA: Diagnosis not present

## 2011-07-25 DIAGNOSIS — M069 Rheumatoid arthritis, unspecified: Secondary | ICD-10-CM | POA: Diagnosis not present

## 2011-07-25 DIAGNOSIS — M545 Low back pain: Secondary | ICD-10-CM | POA: Diagnosis not present

## 2011-08-03 ENCOUNTER — Ambulatory Visit: Admit: 2011-08-03 | Payer: Medicare Other | Admitting: Internal Medicine

## 2011-08-03 SURGERY — COLONOSCOPY
Anesthesia: Moderate Sedation

## 2011-08-17 DIAGNOSIS — Z6826 Body mass index (BMI) 26.0-26.9, adult: Secondary | ICD-10-CM | POA: Diagnosis not present

## 2011-08-17 DIAGNOSIS — J45909 Unspecified asthma, uncomplicated: Secondary | ICD-10-CM | POA: Diagnosis not present

## 2011-08-17 DIAGNOSIS — J069 Acute upper respiratory infection, unspecified: Secondary | ICD-10-CM | POA: Diagnosis not present

## 2011-08-18 DIAGNOSIS — M999 Biomechanical lesion, unspecified: Secondary | ICD-10-CM | POA: Diagnosis not present

## 2011-08-18 DIAGNOSIS — M5137 Other intervertebral disc degeneration, lumbosacral region: Secondary | ICD-10-CM | POA: Diagnosis not present

## 2011-09-13 DIAGNOSIS — M999 Biomechanical lesion, unspecified: Secondary | ICD-10-CM | POA: Diagnosis not present

## 2011-09-13 DIAGNOSIS — M5137 Other intervertebral disc degeneration, lumbosacral region: Secondary | ICD-10-CM | POA: Diagnosis not present

## 2011-09-22 DIAGNOSIS — M545 Low back pain: Secondary | ICD-10-CM | POA: Diagnosis not present

## 2011-09-22 DIAGNOSIS — Z79899 Other long term (current) drug therapy: Secondary | ICD-10-CM | POA: Diagnosis not present

## 2011-09-22 DIAGNOSIS — F17200 Nicotine dependence, unspecified, uncomplicated: Secondary | ICD-10-CM | POA: Diagnosis not present

## 2011-09-22 DIAGNOSIS — M069 Rheumatoid arthritis, unspecified: Secondary | ICD-10-CM | POA: Diagnosis not present

## 2011-10-05 DIAGNOSIS — Z0000 Encounter for general adult medical examination without abnormal findings: Secondary | ICD-10-CM | POA: Diagnosis not present

## 2011-10-18 DIAGNOSIS — M5137 Other intervertebral disc degeneration, lumbosacral region: Secondary | ICD-10-CM | POA: Diagnosis not present

## 2011-10-18 DIAGNOSIS — M999 Biomechanical lesion, unspecified: Secondary | ICD-10-CM | POA: Diagnosis not present

## 2011-11-15 DIAGNOSIS — S335XXA Sprain of ligaments of lumbar spine, initial encounter: Secondary | ICD-10-CM | POA: Diagnosis not present

## 2011-11-15 DIAGNOSIS — M999 Biomechanical lesion, unspecified: Secondary | ICD-10-CM | POA: Diagnosis not present

## 2011-11-17 DIAGNOSIS — Z79899 Other long term (current) drug therapy: Secondary | ICD-10-CM | POA: Diagnosis not present

## 2011-11-17 DIAGNOSIS — M069 Rheumatoid arthritis, unspecified: Secondary | ICD-10-CM | POA: Diagnosis not present

## 2011-12-07 DIAGNOSIS — Z79899 Other long term (current) drug therapy: Secondary | ICD-10-CM | POA: Diagnosis not present

## 2011-12-07 DIAGNOSIS — M069 Rheumatoid arthritis, unspecified: Secondary | ICD-10-CM | POA: Diagnosis not present

## 2011-12-07 DIAGNOSIS — J069 Acute upper respiratory infection, unspecified: Secondary | ICD-10-CM | POA: Diagnosis not present

## 2011-12-07 DIAGNOSIS — M810 Age-related osteoporosis without current pathological fracture: Secondary | ICD-10-CM | POA: Diagnosis not present

## 2011-12-13 DIAGNOSIS — M999 Biomechanical lesion, unspecified: Secondary | ICD-10-CM | POA: Diagnosis not present

## 2011-12-13 DIAGNOSIS — S335XXA Sprain of ligaments of lumbar spine, initial encounter: Secondary | ICD-10-CM | POA: Diagnosis not present

## 2011-12-26 DIAGNOSIS — J209 Acute bronchitis, unspecified: Secondary | ICD-10-CM | POA: Diagnosis not present

## 2011-12-26 DIAGNOSIS — E785 Hyperlipidemia, unspecified: Secondary | ICD-10-CM | POA: Diagnosis not present

## 2011-12-26 DIAGNOSIS — J45909 Unspecified asthma, uncomplicated: Secondary | ICD-10-CM | POA: Diagnosis not present

## 2011-12-26 DIAGNOSIS — I10 Essential (primary) hypertension: Secondary | ICD-10-CM | POA: Diagnosis not present

## 2012-01-17 DIAGNOSIS — M999 Biomechanical lesion, unspecified: Secondary | ICD-10-CM | POA: Diagnosis not present

## 2012-01-17 DIAGNOSIS — S335XXA Sprain of ligaments of lumbar spine, initial encounter: Secondary | ICD-10-CM | POA: Diagnosis not present

## 2012-01-24 DIAGNOSIS — E079 Disorder of thyroid, unspecified: Secondary | ICD-10-CM | POA: Diagnosis not present

## 2012-02-02 ENCOUNTER — Other Ambulatory Visit (HOSPITAL_COMMUNITY): Payer: Self-pay | Admitting: "Endocrinology

## 2012-02-02 DIAGNOSIS — E042 Nontoxic multinodular goiter: Secondary | ICD-10-CM

## 2012-02-04 ENCOUNTER — Encounter (HOSPITAL_COMMUNITY): Payer: Self-pay | Admitting: *Deleted

## 2012-02-04 ENCOUNTER — Emergency Department (HOSPITAL_COMMUNITY)
Admission: EM | Admit: 2012-02-04 | Discharge: 2012-02-04 | Disposition: A | Discharge status: Home / Self Care | Payer: Medicare Other | Attending: Emergency Medicine | Admitting: Emergency Medicine

## 2012-02-04 DIAGNOSIS — E119 Type 2 diabetes mellitus without complications: Secondary | ICD-10-CM | POA: Diagnosis not present

## 2012-02-04 DIAGNOSIS — M329 Systemic lupus erythematosus, unspecified: Secondary | ICD-10-CM | POA: Diagnosis not present

## 2012-02-04 DIAGNOSIS — Z8619 Personal history of other infectious and parasitic diseases: Secondary | ICD-10-CM | POA: Diagnosis not present

## 2012-02-04 DIAGNOSIS — Z79899 Other long term (current) drug therapy: Secondary | ICD-10-CM | POA: Insufficient documentation

## 2012-02-04 DIAGNOSIS — B029 Zoster without complications: Secondary | ICD-10-CM | POA: Diagnosis not present

## 2012-02-04 DIAGNOSIS — R21 Rash and other nonspecific skin eruption: Secondary | ICD-10-CM | POA: Diagnosis not present

## 2012-02-04 DIAGNOSIS — S0993XA Unspecified injury of face, initial encounter: Secondary | ICD-10-CM | POA: Diagnosis not present

## 2012-02-04 DIAGNOSIS — S0003XA Contusion of scalp, initial encounter: Secondary | ICD-10-CM | POA: Diagnosis not present

## 2012-02-04 DIAGNOSIS — M069 Rheumatoid arthritis, unspecified: Secondary | ICD-10-CM | POA: Insufficient documentation

## 2012-02-04 DIAGNOSIS — F17200 Nicotine dependence, unspecified, uncomplicated: Secondary | ICD-10-CM | POA: Diagnosis not present

## 2012-02-04 DIAGNOSIS — M542 Cervicalgia: Secondary | ICD-10-CM | POA: Diagnosis not present

## 2012-02-04 DIAGNOSIS — IMO0001 Reserved for inherently not codable concepts without codable children: Secondary | ICD-10-CM | POA: Insufficient documentation

## 2012-02-04 MED ORDER — OXYCODONE-ACETAMINOPHEN 5-325 MG PO TABS: 1.0000 | ORAL_TABLET | Freq: Three times a day (TID) | ORAL | Status: DC | PRN

## 2012-02-04 MED ORDER — TETRACAINE HCL 0.5 % OP SOLN
2.0000 [drp] | Freq: Once | OPHTHALMIC | Status: AC
  Administered 2012-02-04: 2 [drp] via OPHTHALMIC
  Filled 2012-02-04: qty 2

## 2012-02-04 MED ORDER — FLUORESCEIN SODIUM 1 MG OP STRP
1.0000 | ORAL_STRIP | Freq: Once | OPHTHALMIC | Status: AC
  Administered 2012-02-04: 1 via OPHTHALMIC
  Filled 2012-02-04: qty 1

## 2012-02-04 MED ORDER — ACYCLOVIR 400 MG PO TABS: 400.0000 mg | ORAL_TABLET | Freq: Four times a day (QID) | ORAL | Status: DC

## 2012-02-04 NOTE — ED Notes (Signed)
Blistering began to top of head Friday after itching to area x 1 week. Pt states a lot of pain to area at times, with rash working down to her face, per pt. States she had "shingles shot" in 2011.

## 2012-02-04 NOTE — ED Provider Notes (Signed)
History   This chart was scribed for Joya Gaskins, MD by Toya Smothers, ED Scribe. The patient was seen in room APA07/APA07. Patient's care was started at 1154.  CSN: [id number]  Arrival date & time 02/04/12  1154   First MD Initiated Contact with Patient 02/04/12 1217      Chief Complaint  Patient presents with  . Rash    Patient is a 72 y.o. female presenting with rash. The history is provided by the patient. No language interpreter was used.  Rash  This is a new problem. The current episode started 2 days ago. The problem is associated with nothing. There has been no fever. The rash is present on the face, head and scalp. The pain is severe. The pain has been constant since onset. Associated symptoms include itching and pain.    [name] is a 72 y.o. female h/o ringworm, DM, and fibromyalgia, who presents to the ED complaining of 2 days of new, gradual onset, constant, moderate rash to scalp and forehead, with redness and itching. Pain is severe, constant, worse with palpation, and alleviated by nothing. Symptoms have not been treated PTA. No fever, chills, congestion, rhinorrhea, chest pain, SOB, or n/v/d. No new visual changes    Past Medical History  Diagnosis Date  . Diabetes mellitus     x 20 yrs  . Lupus     sle  . Fibromyalgia   . RA (rheumatoid arthritis)     ra    Past Surgical History  Procedure Date  . Appendectomy   . Cesarean section x3  . Oophorectomy   . Cataract extraction w/ intraocular lens implant 2010    right eye  . Abdominal hysterectomy   . Left arm surgery     multiple surgeries on  . Trigger finger release yrs ago    right hand  . Knee arthroscopy yrs ago    left knee  . Shoulder hemi-arthroplasty 01/27/2011    Procedure: SHOULDER HEMI-ARTHROPLASTY;  Surgeon: Mable Paris, MD;  Location: [facility];  Service: Orthopedics;  Laterality: Right;  interscaline right shoulder    No family history on file.  History    Substance Use Topics  . Smoking status: Current Every Day Smoker -- 1.0 packs/day for 25 years  . Smokeless tobacco: Not on file     Comment: 2 packs a day x 30 yrs  . Alcohol Use: No    Review of Systems  Constitutional: Negative for fever.  Eyes: Negative for visual disturbance.  Gastrointestinal: Positive for vomiting. Negative for diarrhea.  Skin: Positive for itching and rash.  All other systems reviewed and are negative.    Allergies  Review of patient's allergies indicates no known allergies.  Home Medications   Current Outpatient Rx  Name  Route  Sig  Dispense  Refill  . ALENDRONATE SODIUM 70 MG PO TABS   Oral   Take 1 tablet by mouth Once a week.         Marland Kitchen ALKA-SELTZER PLUS COLD PO   Oral   Take by mouth.         Marland Kitchen VITAMIN D 1000 UNITS PO TABS   Oral   Take 1,000 Units by mouth 2 (two) times daily. Takes 2000 units twice day         . ALKA-SELTZER PLUS ALLERGY PO   Oral   Take by mouth.         . FOLIC ACID 1 MG PO TABS  Oral   Take 1 mg by mouth daily.         Marland Kitchen GABAPENTIN 300 MG PO CAPS   Oral   Take 300 mg by mouth 2 (two) times daily.         . GLYBURIDE-METFORMIN 5-500 MG PO TABS   Oral   Take 1 tablet by mouth Twice daily.         Marland Kitchen LISINOPRIL-HYDROCHLOROTHIAZIDE 10-12.5 MG PO TABS   Oral   Take 1 tablet by mouth Daily.         Marland Kitchen METFORMIN HCL 500 MG PO TABS   Oral   Take 500 mg by mouth 2 (two) times daily with a meal.          . METHOTREXATE SODIUM 7.5 MG PO TABS   Oral   Take 7.5 mg by mouth 2 (two) times a week. Caution" Chemotherapy. Protect from light. Takes on Tuesday and wednesday         . METOPROLOL TARTRATE 25 MG PO TABS   Oral   Take 25 mg by mouth 2 (two) times daily.          . OXYCODONE-ACETAMINOPHEN 5-500 MG PO CAPS   Oral   Take 1 capsule by mouth every 4 (four) hours as needed.         Marland Kitchen PEG-KCL-NACL-NASULF-NA ASC-C 100 G PO SOLR   Oral   Take 1 kit (100 g total) by mouth once.   1  kit   0   . PREDNISONE 5 MG PO TABS   Oral   Take 5 mg by mouth Daily.         Marland Kitchen PROMETHAZINE HCL 25 MG PO TABS   Oral   Take 25 mg by mouth every 6 (six) hours as needed.         Marland Kitchen TRAMADOL HCL 50 MG PO TABS   Oral   Take 50 mg by mouth every 6 (six) hours as needed. Maximum dose= 8 tablets per day           BP 145/44  Pulse 110  Temp 98.4 F (36.9 C) (Oral)  Resp 20  Ht 5\' 4"  (1.626 m)  Wt 156 lb (70.761 kg)  BMI 26.78 kg/m2  SpO2 100%  Physical Exam  CONSTITUTIONAL: Well developed/well nourished HEAD AND FACE: Normocephalic/atraumatic EYES: EOMI/PERRL. No lesions noted around right eye.  No corneal lesions are noted.  No evidence of keratitis ENMT: Mucous membranes moist NECK: supple no meningeal signs SPINE:entire spine nontender CV: S1/S2 noted, no murmurs/rubs/gallops noted LUNGS: Lungs are clear to auscultation bilaterally, no apparent distress ABDOMEN: soft, nontender, no rebound or guarding NEURO: Pt is awake/alert, moves all extremitiesx4 EXTREMITIES: pulses normal, full ROM SKIN: warm, color normal, Scattered vesicles on right side of frontal scalp. One vesicle on right bridge of nose.  No lesions surrounding right eye.  No crepitance or erythematous streaking PSYCH: no abnormalities of mood noted   ED Course  Procedures DIAGNOSTIC STUDIES: Oxygen Saturation is 100% on room air, normal by my interpretation.    COORDINATION OF CARE: 12:37- Evaluated Pt. Pt is awake, alert, and without distress. 12:45- Ordered fluorescein ophthalmic strip 1 strip and tetracaine (PONTOCAINE) 0.5 % ophthalmic solution 2 drop once.  Pt has scattered lesions that could be c/w shingles to right frontal scalp and one on side of right nose.  No evidence of opthalmicus.  She is well appearing.  Reports she does not have significant pain meds at home.  Will  start acyclovir.  Last creat check per records normal.  Stable for d/c   MDM  Nursing notes including past medical  history and social history reviewed and considered in documentation       I personally performed the services described in this documentation, which was scribed in my presence. The recorded information has been reviewed and is accurate.      Joya Gaskins, MD 02/04/12 1345

## 2012-02-05 ENCOUNTER — Emergency Department (HOSPITAL_COMMUNITY): Payer: Medicare Other

## 2012-02-05 ENCOUNTER — Encounter (HOSPITAL_COMMUNITY): Payer: Self-pay | Admitting: *Deleted

## 2012-02-05 ENCOUNTER — Emergency Department (HOSPITAL_COMMUNITY)
Admission: EM | Admit: 2012-02-05 | Discharge: 2012-02-05 | Disposition: A | Discharge status: Home / Self Care | Payer: Medicare Other | Attending: Emergency Medicine | Admitting: Emergency Medicine

## 2012-02-05 DIAGNOSIS — S199XXA Unspecified injury of neck, initial encounter: Secondary | ICD-10-CM | POA: Diagnosis not present

## 2012-02-05 DIAGNOSIS — F17200 Nicotine dependence, unspecified, uncomplicated: Secondary | ICD-10-CM | POA: Diagnosis not present

## 2012-02-05 DIAGNOSIS — Z79899 Other long term (current) drug therapy: Secondary | ICD-10-CM | POA: Diagnosis not present

## 2012-02-05 DIAGNOSIS — R21 Rash and other nonspecific skin eruption: Secondary | ICD-10-CM | POA: Insufficient documentation

## 2012-02-05 DIAGNOSIS — W1809XA Striking against other object with subsequent fall, initial encounter: Secondary | ICD-10-CM | POA: Insufficient documentation

## 2012-02-05 DIAGNOSIS — Z8739 Personal history of other diseases of the musculoskeletal system and connective tissue: Secondary | ICD-10-CM | POA: Insufficient documentation

## 2012-02-05 DIAGNOSIS — M542 Cervicalgia: Secondary | ICD-10-CM | POA: Diagnosis not present

## 2012-02-05 DIAGNOSIS — T1490XA Injury, unspecified, initial encounter: Secondary | ICD-10-CM | POA: Diagnosis not present

## 2012-02-05 DIAGNOSIS — T148XXA Other injury of unspecified body region, initial encounter: Secondary | ICD-10-CM

## 2012-02-05 DIAGNOSIS — M069 Rheumatoid arthritis, unspecified: Secondary | ICD-10-CM | POA: Diagnosis not present

## 2012-02-05 DIAGNOSIS — E119 Type 2 diabetes mellitus without complications: Secondary | ICD-10-CM | POA: Diagnosis not present

## 2012-02-05 DIAGNOSIS — Y929 Unspecified place or not applicable: Secondary | ICD-10-CM | POA: Insufficient documentation

## 2012-02-05 DIAGNOSIS — F4489 Other dissociative and conversion disorders: Secondary | ICD-10-CM | POA: Diagnosis not present

## 2012-02-05 DIAGNOSIS — S1093XA Contusion of unspecified part of neck, initial encounter: Secondary | ICD-10-CM | POA: Insufficient documentation

## 2012-02-05 DIAGNOSIS — Y9389 Activity, other specified: Secondary | ICD-10-CM | POA: Insufficient documentation

## 2012-02-05 DIAGNOSIS — S0990XA Unspecified injury of head, initial encounter: Secondary | ICD-10-CM | POA: Diagnosis not present

## 2012-02-05 DIAGNOSIS — W19XXXA Unspecified fall, initial encounter: Secondary | ICD-10-CM

## 2012-02-05 DIAGNOSIS — S0993XA Unspecified injury of face, initial encounter: Secondary | ICD-10-CM | POA: Diagnosis not present

## 2012-02-05 DIAGNOSIS — B029 Zoster without complications: Secondary | ICD-10-CM | POA: Diagnosis not present

## 2012-02-05 DIAGNOSIS — S0003XA Contusion of scalp, initial encounter: Secondary | ICD-10-CM | POA: Diagnosis not present

## 2012-02-05 NOTE — ED Provider Notes (Signed)
History     CSN: [id number]  Arrival date & time 02/05/12  0047   First MD Initiated Contact with Patient 02/05/12 0109      Chief Complaint  Patient presents with  . Fall  . Head Injury    (Consider location/radiation/quality/duration/timing/severity/associated sxs/prior treatment) HPI [name] is a 72 y.o. female with a h/o DM, lupus, RA,  Fibromyalgia, and recent diagnosis of shingles (02/04/12) brought in by ambulance, who presents to the Emergency Department complaining of falling out of bed and hitting her head on the floor. No LOC. She continues to have pain and a "rash" to the right forehead and scalp diagnosed as shingles yesterday. She has taken the prescribed medicines.  PCP Dr. Sherwood Gambler Past Medical History  Diagnosis Date  . Diabetes mellitus     x 20 yrs  . Lupus     sle  . Fibromyalgia   . RA (rheumatoid arthritis)     ra    Past Surgical History  Procedure Date  . Appendectomy   . Cesarean section x3  . Oophorectomy   . Cataract extraction w/ intraocular lens implant 2010    right eye  . Abdominal hysterectomy   . Left arm surgery     multiple surgeries on  . Trigger finger release yrs ago    right hand  . Knee arthroscopy yrs ago    left knee  . Shoulder hemi-arthroplasty 01/27/2011    Procedure: SHOULDER HEMI-ARTHROPLASTY;  Surgeon: Mable Paris, MD;  Location: [facility];  Service: Orthopedics;  Laterality: Right;  interscaline right shoulder    History reviewed. No pertinent family history.  History  Substance Use Topics  . Smoking status: Current Every Day Smoker -- 1.0 packs/day for 25 years  . Smokeless tobacco: Not on file     Comment: 2 packs a day x 30 yrs  . Alcohol Use: No    OB History    Grav Para Term Preterm Abortions TAB SAB Ect Mult Living                  Review of Systems  Constitutional: Negative for fever.       10 Systems reviewed and are negative for acute change except as noted in the HPI.  HENT:  Negative for congestion.        Bruise to left forehead  Eyes: Negative for discharge and redness.  Respiratory: Negative for cough and shortness of breath.   Cardiovascular: Negative for chest pain.  Gastrointestinal: Negative for vomiting and abdominal pain.  Musculoskeletal: Negative for back pain.  Skin: Negative for rash.       Rash to forehead and scalp  Neurological: Negative for syncope, numbness and headaches.  Psychiatric/Behavioral:       No behavior change.    Allergies  Review of patient's allergies indicates no known allergies.  Home Medications   Current Outpatient Rx  Name  Route  Sig  Dispense  Refill  . ACYCLOVIR 400 MG PO TABS   Oral   Take 1 tablet (400 mg total) by mouth 4 (four) times daily.   28 tablet   0   . ALENDRONATE SODIUM 70 MG PO TABS   Oral   Take 1 tablet by mouth Once a week. On Fridays         . VITAMIN D 1000 UNITS PO TABS   Oral   Take 1,000 Units by mouth 2 (two) times daily.          Marland Kitchen  FOLIC ACID 1 MG PO TABS   Oral   Take 1 mg by mouth daily.         Marland Kitchen GABAPENTIN 300 MG PO CAPS   Oral   Take 300 mg by mouth 2 (two) times daily.         . GLYBURIDE-METFORMIN 5-500 MG PO TABS   Oral   Take 1 tablet by mouth Twice daily.         Marland Kitchen LISINOPRIL-HYDROCHLOROTHIAZIDE 10-12.5 MG PO TABS   Oral   Take 1 tablet by mouth Daily.         Marland Kitchen METFORMIN HCL 500 MG PO TABS   Oral   Take 500 mg by mouth 2 (two) times daily with a meal.          . METHOTREXATE SODIUM 7.5 MG PO TABS   Oral   Take 7.5 mg by mouth 2 (two) times a week. Caution" Chemotherapy. Protect from light. Takes on Tuesday and wednesday         . METOPROLOL TARTRATE 25 MG PO TABS   Oral   Take 25 mg by mouth 2 (two) times daily.          . OXYCODONE-ACETAMINOPHEN 5-325 MG PO TABS   Oral   Take 1 tablet by mouth every 8 (eight) hours as needed for pain.   15 tablet   0   . OXYCODONE-ACETAMINOPHEN 5-500 MG PO CAPS   Oral   Take 1 capsule by  mouth every 4 (four) hours as needed. For pain         . PREDNISONE 5 MG PO TABS   Oral   Take 5 mg by mouth Daily.         Marland Kitchen PROMETHAZINE HCL 25 MG PO TABS   Oral   Take 25 mg by mouth every 6 (six) hours as needed. For nausea           BP 166/55  Pulse 111  Temp 100.6 F (38.1 C) (Oral)  Ht 5\' 4"  (1.626 m)  Wt 145 lb (65.772 kg)  BMI 24.89 kg/m2  SpO2 95%  Physical Exam  Nursing note and vitals reviewed. Constitutional: She is oriented to person, place, and time.       Awake, alert, nontoxic appearance.  HENT:  Head: Normocephalic.  Right Ear: External ear normal.  Left Ear: External ear normal.  Mouth/Throat: Oropharynx is clear and moist.       Moderate hematoma to left forehead and eyebrow. Vesicular rash to right forehead and scalp. Single vesicle to right side of  nose.   Eyes: Right eye exhibits no discharge. Left eye exhibits no discharge.  Neck: Neck supple.  Pulmonary/Chest: Effort normal. She exhibits no tenderness.  Abdominal: Soft. There is no tenderness. There is no rebound.  Musculoskeletal: She exhibits no tenderness.       Baseline ROM, no obvious new focal weakness.  Neurological: She is alert and oriented to person, place, and time. She has normal reflexes. No cranial nerve deficit.       Mental status and motor strength appears baseline for patient and situation.  Skin: No rash noted.  Psychiatric: She has a normal mood and affect.    ED Course  Procedures (including critical care time)  Ct Head Wo Contrast  02/05/2012  *RADIOLOGY REPORT*  Clinical Data:  The patient fell and has bruising on the forehead and neck pain.  Confusion.  CT HEAD WITHOUT CONTRAST CT CERVICAL SPINE WITHOUT CONTRAST  Technique:  Multidetector CT imaging of the head and cervical spine was performed following the standard protocol without intravenous contrast.  Multiplanar CT image reconstructions of the cervical spine were also generated.  Comparison:  CT head  10/17/2007  CT HEAD  Findings: Diffuse cerebral atrophy.  Mild ventricular dilatation consistent with central atrophy.  Patchy low attenuation changes in the white matter consistent with small vessel ischemia.  There is focal encephalomalacia in the right posterior parietal lobe without sulci effacement.  This is new since the previous study.  Age is indeterminate.  If there is suspicion of acute infarct, MRI would be useful in additional evaluation.  No mass effect or midline shift.  No abnormal extra-axial fluid collections.  Gray-white matter junctions are distinct.  Basal cisterns are not effaced.  No evidence of acute intracranial hemorrhage.  There is a subcutaneous soft tissue scalp hematoma over the right anterior frontal region. No underlying depressed skull fractures.  Visualized paranasal sinuses and mastoid air cells are not opacified.  Postoperative changes in the right mastoid region.  The vascular calcifications.  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  No acute intracranial hemorrhage.  Subcutaneous scalp hematoma over the left anterior frontal region.  CT CERVICAL SPINE  Findings: Normal alignment of the cervical vertebrae and facet joints.  Lateral masses of C1 appear symmetrical.  The odontoid process appears intact.  Degenerative disc narrowing at C4-5 and C5- 6 with mild hypertrophic changes.  Moderately prominent central disc osteophyte complex at C3-4 and C4-5 causes some effacement of the anterior thecal sac.  No vertebral compression deformities.  No prevertebral soft tissue swelling.  No focal bone lesion or bone destruction.  The bone cortex and trabecular architecture appear intact.  No paraspinal soft tissue infiltration.  Bilateral apical scarring.  Vascular calcifications.  IMPRESSION: Degenerative changes in the cervical spine.  No displaced fractures identified.   Original Report Authenticated By: Burman Nieves, M.D.    Ct  Cervical Spine Wo Contrast  02/05/2012  *RADIOLOGY REPORT*  Clinical Data:  The patient fell and has bruising on the forehead and neck pain.  Confusion.  CT HEAD WITHOUT CONTRAST CT CERVICAL SPINE WITHOUT CONTRAST  Technique:  Multidetector CT imaging of the head and cervical spine was performed following the standard protocol without intravenous contrast.  Multiplanar CT image reconstructions of the cervical spine were also generated.  Comparison:  CT head 10/17/2007  CT HEAD  Findings: Diffuse cerebral atrophy.  Mild ventricular dilatation consistent with central atrophy.  Patchy low attenuation changes in the white matter consistent with small vessel ischemia.  There is focal encephalomalacia in the right posterior parietal lobe without sulci effacement.  This is new since the previous study.  Age is indeterminate.  If there is suspicion of acute infarct, MRI would be useful in additional evaluation.  No mass effect or midline shift.  No abnormal extra-axial fluid collections.  Gray-white matter junctions are distinct.  Basal cisterns are not effaced.  No evidence of acute intracranial hemorrhage.  There is a subcutaneous soft tissue scalp hematoma over the right anterior frontal region. No underlying depressed skull fractures.  Visualized paranasal sinuses and mastoid air cells are not opacified.  Postoperative changes in the right mastoid region.  The vascular calcifications.  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  No acute intracranial hemorrhage.  Subcutaneous scalp hematoma over the left anterior frontal region.  CT CERVICAL SPINE  Findings: Normal  alignment of the cervical vertebrae and facet joints.  Lateral masses of C1 appear symmetrical.  The odontoid process appears intact.  Degenerative disc narrowing at C4-5 and C5- 6 with mild hypertrophic changes.  Moderately prominent central disc osteophyte complex at C3-4 and C4-5 causes some effacement of  the anterior thecal sac.  No vertebral compression deformities.  No prevertebral soft tissue swelling.  No focal bone lesion or bone destruction.  The bone cortex and trabecular architecture appear intact.  No paraspinal soft tissue infiltration.  Bilateral apical scarring.  Vascular calcifications.  IMPRESSION: Degenerative changes in the cervical spine.  No displaced fractures identified.   Original Report Authenticated By: Burman Nieves, M.D.       MDM  Patient with DM, lupus, RA, fibromyalgia, shingles, presents s/p fall from bed sustaining a hematoma to left forehead. CTs negative for acute process. Reviewed result with patient. Pt stable in ED with no significant deterioration in condition.The patient appears reasonably screened and/or stabilized for discharge and I doubt any other medical condition or other [facility] requiring further screening, evaluation, or treatment in the ED at this time prior to discharge.  MDM Reviewed: previous chart, nursing note and vitals Interpretation: CT scan            Nicoletta Dress. Colon Branch, MD 02/05/12 [phone]

## 2012-02-05 NOTE — ED Notes (Signed)
Discharge instructions reviewed with pt, questions answered. Pt verbalized understanding.  

## 2012-02-05 NOTE — ED Notes (Addendum)
Pt to department via EMS.  Per report, pt was sitting at table and fell striking head.  Bruise and swelling noted to left side of forehead. Pt was seen previously and prescribed Percocet 5 by Dr. Bebe Shaggy.  Pt has taken 3 tabs from script.

## 2012-02-07 ENCOUNTER — Ambulatory Visit (HOSPITAL_COMMUNITY)
Admission: RE | Admit: 2012-02-07 | Discharge: 2012-02-07 | Disposition: A | Discharge status: Home / Self Care | Payer: Medicare Other | Source: Ambulatory Visit | Attending: "Endocrinology | Admitting: "Endocrinology

## 2012-02-07 DIAGNOSIS — E042 Nontoxic multinodular goiter: Secondary | ICD-10-CM | POA: Diagnosis not present

## 2012-02-08 DIAGNOSIS — Z6826 Body mass index (BMI) 26.0-26.9, adult: Secondary | ICD-10-CM | POA: Diagnosis not present

## 2012-02-08 DIAGNOSIS — R55 Syncope and collapse: Secondary | ICD-10-CM | POA: Diagnosis not present

## 2012-02-08 DIAGNOSIS — I10 Essential (primary) hypertension: Secondary | ICD-10-CM | POA: Diagnosis not present

## 2012-02-08 DIAGNOSIS — E1149 Type 2 diabetes mellitus with other diabetic neurological complication: Secondary | ICD-10-CM | POA: Diagnosis not present

## 2012-02-08 DIAGNOSIS — E785 Hyperlipidemia, unspecified: Secondary | ICD-10-CM | POA: Diagnosis not present

## 2012-02-14 DIAGNOSIS — S335XXA Sprain of ligaments of lumbar spine, initial encounter: Secondary | ICD-10-CM | POA: Diagnosis not present

## 2012-02-14 DIAGNOSIS — M999 Biomechanical lesion, unspecified: Secondary | ICD-10-CM | POA: Diagnosis not present

## 2012-02-22 DIAGNOSIS — R55 Syncope and collapse: Secondary | ICD-10-CM | POA: Diagnosis not present

## 2012-02-23 ENCOUNTER — Other Ambulatory Visit (HOSPITAL_COMMUNITY): Payer: Self-pay | Admitting: Cardiovascular Disease

## 2012-02-23 DIAGNOSIS — R55 Syncope and collapse: Secondary | ICD-10-CM

## 2012-03-07 ENCOUNTER — Inpatient Hospital Stay (HOSPITAL_COMMUNITY): Admission: RE | Admit: 2012-03-07 | Payer: Medicare Other | Source: Ambulatory Visit

## 2012-03-08 DIAGNOSIS — E042 Nontoxic multinodular goiter: Secondary | ICD-10-CM | POA: Diagnosis not present

## 2012-03-09 ENCOUNTER — Encounter: Payer: Self-pay | Admitting: *Deleted

## 2012-03-13 DIAGNOSIS — S335XXA Sprain of ligaments of lumbar spine, initial encounter: Secondary | ICD-10-CM | POA: Diagnosis not present

## 2012-03-13 DIAGNOSIS — M999 Biomechanical lesion, unspecified: Secondary | ICD-10-CM | POA: Diagnosis not present

## 2012-03-15 DIAGNOSIS — R55 Syncope and collapse: Secondary | ICD-10-CM | POA: Diagnosis not present

## 2012-03-28 DIAGNOSIS — M069 Rheumatoid arthritis, unspecified: Secondary | ICD-10-CM | POA: Diagnosis not present

## 2012-03-28 DIAGNOSIS — Z79899 Other long term (current) drug therapy: Secondary | ICD-10-CM | POA: Diagnosis not present

## 2012-03-28 DIAGNOSIS — M810 Age-related osteoporosis without current pathological fracture: Secondary | ICD-10-CM | POA: Diagnosis not present

## 2012-03-28 DIAGNOSIS — R55 Syncope and collapse: Secondary | ICD-10-CM | POA: Diagnosis not present

## 2012-03-29 DIAGNOSIS — Z79899 Other long term (current) drug therapy: Secondary | ICD-10-CM | POA: Diagnosis not present

## 2012-03-29 DIAGNOSIS — M810 Age-related osteoporosis without current pathological fracture: Secondary | ICD-10-CM | POA: Diagnosis not present

## 2012-03-29 DIAGNOSIS — M069 Rheumatoid arthritis, unspecified: Secondary | ICD-10-CM | POA: Diagnosis not present

## 2012-04-04 DIAGNOSIS — R55 Syncope and collapse: Secondary | ICD-10-CM | POA: Diagnosis not present

## 2012-04-04 DIAGNOSIS — E785 Hyperlipidemia, unspecified: Secondary | ICD-10-CM | POA: Diagnosis not present

## 2012-04-10 DIAGNOSIS — M999 Biomechanical lesion, unspecified: Secondary | ICD-10-CM | POA: Diagnosis not present

## 2012-04-10 DIAGNOSIS — S335XXA Sprain of ligaments of lumbar spine, initial encounter: Secondary | ICD-10-CM | POA: Diagnosis not present

## 2012-05-09 DIAGNOSIS — M999 Biomechanical lesion, unspecified: Secondary | ICD-10-CM | POA: Diagnosis not present

## 2012-05-09 DIAGNOSIS — S335XXA Sprain of ligaments of lumbar spine, initial encounter: Secondary | ICD-10-CM | POA: Diagnosis not present

## 2012-05-29 ENCOUNTER — Encounter (HOSPITAL_COMMUNITY): Payer: Medicare Other | Attending: Rheumatology

## 2012-05-29 VITALS — BP 123/41 | HR 77 | Temp 98.2°F | Resp 18 | Wt 156.5 lb

## 2012-05-29 DIAGNOSIS — M069 Rheumatoid arthritis, unspecified: Secondary | ICD-10-CM | POA: Insufficient documentation

## 2012-05-29 MED ORDER — SODIUM CHLORIDE 0.9 % IJ SOLN
10.0000 mL | INTRAMUSCULAR | Status: DC | PRN
  Administered 2012-05-29: 10 mL via INTRAVENOUS

## 2012-05-29 MED ORDER — DIPHENHYDRAMINE HCL 50 MG/ML IJ SOLN
INTRAMUSCULAR | Status: AC
  Filled 2012-05-29: qty 1

## 2012-05-29 MED ORDER — SODIUM CHLORIDE 0.9 % IV SOLN
INTRAVENOUS | Status: DC
  Administered 2012-05-29: 12:00:00 via INTRAVENOUS

## 2012-05-29 MED ORDER — SODIUM CHLORIDE 0.9 % IV SOLN
150.0000 mg | Freq: Once | INTRAVENOUS | Status: AC
  Administered 2012-05-29: 150 mg via INTRAVENOUS
  Filled 2012-05-29: qty 12

## 2012-05-29 MED ORDER — DIPHENHYDRAMINE HCL 50 MG/ML IJ SOLN
50.0000 mg | Freq: Once | INTRAMUSCULAR | Status: AC
  Administered 2012-05-29: 25 mg via INTRAVENOUS

## 2012-05-29 NOTE — Progress Notes (Signed)
Tolerated well

## 2012-06-07 DIAGNOSIS — M999 Biomechanical lesion, unspecified: Secondary | ICD-10-CM | POA: Diagnosis not present

## 2012-06-07 DIAGNOSIS — S335XXA Sprain of ligaments of lumbar spine, initial encounter: Secondary | ICD-10-CM | POA: Diagnosis not present

## 2012-06-26 ENCOUNTER — Encounter (HOSPITAL_COMMUNITY): Payer: Medicare Other | Attending: Rheumatology

## 2012-06-26 VITALS — BP 136/46 | HR 78 | Temp 98.0°F | Resp 20 | Wt 156.5 lb

## 2012-06-26 DIAGNOSIS — M069 Rheumatoid arthritis, unspecified: Secondary | ICD-10-CM | POA: Insufficient documentation

## 2012-06-26 MED ORDER — DIPHENHYDRAMINE HCL 50 MG/ML IJ SOLN
50.0000 mg | Freq: Once | INTRAMUSCULAR | Status: AC
  Administered 2012-06-26: 25 mg via INTRAVENOUS

## 2012-06-26 MED ORDER — SODIUM CHLORIDE 0.9 % IV SOLN: 2.0000 mg/kg | Freq: Once | INTRAVENOUS | Status: DC

## 2012-06-26 MED ORDER — SODIUM CHLORIDE 0.9 % IJ SOLN
10.0000 mL | INTRAMUSCULAR | Status: DC | PRN
  Administered 2012-06-26: 10 mL via INTRAVENOUS

## 2012-06-26 MED ORDER — SODIUM CHLORIDE 0.9 % IV SOLN
INTRAVENOUS | Status: DC
  Administered 2012-06-26: 11:00:00 via INTRAVENOUS

## 2012-06-26 MED ORDER — DIPHENHYDRAMINE HCL 50 MG/ML IJ SOLN
INTRAMUSCULAR | Status: AC
  Filled 2012-06-26: qty 1

## 2012-06-26 MED ORDER — GOLIMUMAB 50 MG/4ML IV SOLN
150.0000 mg | Freq: Once | INTRAVENOUS | Status: AC
  Administered 2012-06-26: 150 mg via INTRAVENOUS
  Filled 2012-06-26: qty 12

## 2012-06-26 NOTE — Progress Notes (Signed)
Tolerated second dose Simponi-Aria without any problems or complaints.

## 2012-07-03 DIAGNOSIS — S335XXA Sprain of ligaments of lumbar spine, initial encounter: Secondary | ICD-10-CM | POA: Diagnosis not present

## 2012-07-03 DIAGNOSIS — M999 Biomechanical lesion, unspecified: Secondary | ICD-10-CM | POA: Diagnosis not present

## 2012-07-05 DIAGNOSIS — Z79899 Other long term (current) drug therapy: Secondary | ICD-10-CM | POA: Diagnosis not present

## 2012-07-05 DIAGNOSIS — M069 Rheumatoid arthritis, unspecified: Secondary | ICD-10-CM | POA: Diagnosis not present

## 2012-07-05 DIAGNOSIS — F17200 Nicotine dependence, unspecified, uncomplicated: Secondary | ICD-10-CM | POA: Diagnosis not present

## 2012-07-05 DIAGNOSIS — M25539 Pain in unspecified wrist: Secondary | ICD-10-CM | POA: Diagnosis not present

## 2012-07-09 DIAGNOSIS — Z79899 Other long term (current) drug therapy: Secondary | ICD-10-CM | POA: Diagnosis not present

## 2012-07-09 DIAGNOSIS — M069 Rheumatoid arthritis, unspecified: Secondary | ICD-10-CM | POA: Diagnosis not present

## 2012-07-31 DIAGNOSIS — S335XXA Sprain of ligaments of lumbar spine, initial encounter: Secondary | ICD-10-CM | POA: Diagnosis not present

## 2012-07-31 DIAGNOSIS — M999 Biomechanical lesion, unspecified: Secondary | ICD-10-CM | POA: Diagnosis not present

## 2012-08-21 ENCOUNTER — Encounter (HOSPITAL_COMMUNITY)
Admission: RE | Admit: 2012-08-21 | Discharge: 2012-08-21 | Disposition: A | Discharge status: Home / Self Care | Payer: Medicare Other | Source: Ambulatory Visit | Attending: Rheumatology | Admitting: Rheumatology

## 2012-08-21 ENCOUNTER — Encounter (HOSPITAL_COMMUNITY): Payer: Medicare Other

## 2012-08-21 DIAGNOSIS — M069 Rheumatoid arthritis, unspecified: Secondary | ICD-10-CM | POA: Insufficient documentation

## 2012-08-21 MED ORDER — SODIUM CHLORIDE 0.9 % IV SOLN
INTRAVENOUS | Status: DC
  Administered 2012-08-21: 250 mL via INTRAVENOUS

## 2012-08-21 MED ORDER — SODIUM CHLORIDE 0.9 % IV SOLN
150.0000 mg | Freq: Once | INTRAVENOUS | Status: AC
  Administered 2012-08-21: 150 mg via INTRAVENOUS
  Filled 2012-08-21: qty 12

## 2012-08-21 MED ORDER — DIPHENHYDRAMINE HCL 50 MG/ML IJ SOLN
50.0000 mg | Freq: Once | INTRAMUSCULAR | Status: AC
  Administered 2012-08-21: 25 mg via INTRAVENOUS

## 2012-08-21 MED ORDER — DIPHENHYDRAMINE HCL 50 MG/ML IJ SOLN
INTRAMUSCULAR | Status: AC
  Filled 2012-08-21: qty 1

## 2012-08-21 NOTE — Progress Notes (Signed)
Tolerated Simponi Aria 150mg  IV well. Discharged to home. Next schedule dose 10-17-2012 @1045 .

## 2012-08-28 DIAGNOSIS — S335XXA Sprain of ligaments of lumbar spine, initial encounter: Secondary | ICD-10-CM | POA: Diagnosis not present

## 2012-08-28 DIAGNOSIS — M999 Biomechanical lesion, unspecified: Secondary | ICD-10-CM | POA: Diagnosis not present

## 2012-09-06 DIAGNOSIS — G609 Hereditary and idiopathic neuropathy, unspecified: Secondary | ICD-10-CM | POA: Diagnosis not present

## 2012-09-06 DIAGNOSIS — M25539 Pain in unspecified wrist: Secondary | ICD-10-CM | POA: Diagnosis not present

## 2012-09-06 DIAGNOSIS — M069 Rheumatoid arthritis, unspecified: Secondary | ICD-10-CM | POA: Diagnosis not present

## 2012-09-06 DIAGNOSIS — Z79899 Other long term (current) drug therapy: Secondary | ICD-10-CM | POA: Diagnosis not present

## 2012-09-13 DIAGNOSIS — H353290 Exudative age-related macular degeneration, unspecified eye, stage unspecified: Secondary | ICD-10-CM | POA: Diagnosis not present

## 2012-09-13 DIAGNOSIS — H5200 Hypermetropia, unspecified eye: Secondary | ICD-10-CM | POA: Diagnosis not present

## 2012-09-13 DIAGNOSIS — E1139 Type 2 diabetes mellitus with other diabetic ophthalmic complication: Secondary | ICD-10-CM | POA: Diagnosis not present

## 2012-09-13 DIAGNOSIS — H353190 Nonexudative age-related macular degeneration, unspecified eye, stage unspecified: Secondary | ICD-10-CM | POA: Diagnosis not present

## 2012-09-14 ENCOUNTER — Encounter (INDEPENDENT_AMBULATORY_CARE_PROVIDER_SITE_OTHER): Payer: Medicare Other | Admitting: Ophthalmology

## 2012-09-14 DIAGNOSIS — E1139 Type 2 diabetes mellitus with other diabetic ophthalmic complication: Secondary | ICD-10-CM

## 2012-09-14 DIAGNOSIS — I10 Essential (primary) hypertension: Secondary | ICD-10-CM

## 2012-09-14 DIAGNOSIS — H43819 Vitreous degeneration, unspecified eye: Secondary | ICD-10-CM

## 2012-09-14 DIAGNOSIS — E11319 Type 2 diabetes mellitus with unspecified diabetic retinopathy without macular edema: Secondary | ICD-10-CM

## 2012-09-14 DIAGNOSIS — H3530 Unspecified macular degeneration: Secondary | ICD-10-CM | POA: Diagnosis not present

## 2012-09-14 DIAGNOSIS — H35039 Hypertensive retinopathy, unspecified eye: Secondary | ICD-10-CM

## 2012-09-18 DIAGNOSIS — M069 Rheumatoid arthritis, unspecified: Secondary | ICD-10-CM | POA: Diagnosis not present

## 2012-09-18 DIAGNOSIS — Z79899 Other long term (current) drug therapy: Secondary | ICD-10-CM | POA: Diagnosis not present

## 2012-09-25 DIAGNOSIS — S335XXA Sprain of ligaments of lumbar spine, initial encounter: Secondary | ICD-10-CM | POA: Diagnosis not present

## 2012-09-25 DIAGNOSIS — M999 Biomechanical lesion, unspecified: Secondary | ICD-10-CM | POA: Diagnosis not present

## 2012-10-17 ENCOUNTER — Encounter (HOSPITAL_COMMUNITY): Admission: RE | Admit: 2012-10-17 | Payer: Medicare Other | Source: Ambulatory Visit

## 2012-10-19 ENCOUNTER — Encounter (HOSPITAL_COMMUNITY)
Admission: RE | Admit: 2012-10-19 | Discharge: 2012-10-19 | Disposition: A | Discharge status: Home / Self Care | Payer: Medicare Other | Source: Ambulatory Visit | Attending: Rheumatology | Admitting: Rheumatology

## 2012-10-19 DIAGNOSIS — M069 Rheumatoid arthritis, unspecified: Secondary | ICD-10-CM | POA: Insufficient documentation

## 2012-10-19 MED ORDER — SODIUM CHLORIDE 0.9 % IV SOLN
INTRAVENOUS | Status: DC
  Administered 2012-10-19: 10:00:00 via INTRAVENOUS

## 2012-10-19 MED ORDER — GOLIMUMAB 50 MG/4ML IV SOLN
150.0000 mg | Freq: Once | INTRAVENOUS | Status: AC
  Administered 2012-10-19: 150 mg via INTRAVENOUS
  Filled 2012-10-19: qty 12

## 2012-10-19 MED ORDER — DIPHENHYDRAMINE HCL 50 MG/ML IJ SOLN
INTRAMUSCULAR | Status: AC
  Filled 2012-10-19: qty 1

## 2012-10-19 MED ORDER — DIPHENHYDRAMINE HCL 50 MG/ML IJ SOLN
25.0000 mg | INTRAMUSCULAR | Status: DC | PRN
  Administered 2012-10-19: 25 mg via INTRAVENOUS

## 2012-10-19 NOTE — Progress Notes (Signed)
Infusion of Simponi Aria 150 mg IV given today with [place] 25mg  IV. Patient tolerated well. Will return in 8wks.

## 2012-11-13 DIAGNOSIS — Z79899 Other long term (current) drug therapy: Secondary | ICD-10-CM | POA: Diagnosis not present

## 2012-11-13 DIAGNOSIS — E119 Type 2 diabetes mellitus without complications: Secondary | ICD-10-CM | POA: Diagnosis not present

## 2012-11-13 DIAGNOSIS — R109 Unspecified abdominal pain: Secondary | ICD-10-CM | POA: Diagnosis not present

## 2012-11-13 DIAGNOSIS — Z6827 Body mass index (BMI) 27.0-27.9, adult: Secondary | ICD-10-CM | POA: Diagnosis not present

## 2012-11-27 DIAGNOSIS — L408 Other psoriasis: Secondary | ICD-10-CM | POA: Diagnosis not present

## 2012-11-27 DIAGNOSIS — S335XXA Sprain of ligaments of lumbar spine, initial encounter: Secondary | ICD-10-CM | POA: Diagnosis not present

## 2012-11-27 DIAGNOSIS — M999 Biomechanical lesion, unspecified: Secondary | ICD-10-CM | POA: Diagnosis not present

## 2012-12-06 DIAGNOSIS — L408 Other psoriasis: Secondary | ICD-10-CM | POA: Diagnosis not present

## 2012-12-06 DIAGNOSIS — M069 Rheumatoid arthritis, unspecified: Secondary | ICD-10-CM | POA: Diagnosis not present

## 2012-12-06 DIAGNOSIS — M545 Low back pain: Secondary | ICD-10-CM | POA: Diagnosis not present

## 2012-12-06 DIAGNOSIS — IMO0002 Reserved for concepts with insufficient information to code with codable children: Secondary | ICD-10-CM | POA: Diagnosis not present

## 2012-12-07 DIAGNOSIS — E042 Nontoxic multinodular goiter: Secondary | ICD-10-CM | POA: Diagnosis not present

## 2012-12-17 ENCOUNTER — Encounter (HOSPITAL_COMMUNITY)
Admission: RE | Admit: 2012-12-17 | Discharge: 2012-12-17 | Disposition: A | Discharge status: Home / Self Care | Payer: Medicare Other | Source: Ambulatory Visit | Attending: Rheumatology | Admitting: Rheumatology

## 2012-12-17 ENCOUNTER — Encounter (HOSPITAL_COMMUNITY): Payer: Self-pay

## 2012-12-17 DIAGNOSIS — M069 Rheumatoid arthritis, unspecified: Secondary | ICD-10-CM | POA: Diagnosis not present

## 2012-12-17 LAB — COMPREHENSIVE METABOLIC PANEL
ALT: 16 U/L (ref 0–35)
Alkaline Phosphatase: 105 U/L (ref 39–117)
BUN: 16 mg/dL (ref 6–23)
Chloride: 97 mEq/L (ref 96–112)
GFR calc Af Amer: >90 mL/min (ref >90)
Glucose, Bld: 273 mg/dL — ABNORMAL HIGH (ref 70–99)
Potassium: 4.8 mEq/L (ref 3.5–5.1)
Sodium: 135 mEq/L (ref 135–145)
Total Bilirubin: 0.5 mg/dL (ref 0.3–1.2)
Total Protein: 7.1 g/dL (ref 6.0–8.3)

## 2012-12-17 LAB — CBC
HCT: 38 % (ref 36.0–46.0)
Hemoglobin: 12.4 g/dL (ref 12.0–15.0)
RBC: 3.99 MIL/uL (ref 3.87–5.11)
WBC: 5.4 10*3/uL (ref 4.0–10.5)

## 2012-12-17 MED ORDER — DIPHENHYDRAMINE HCL 50 MG/ML IJ SOLN
25.0000 mg | Freq: Once | INTRAMUSCULAR | Status: AC
  Administered 2012-12-17: 25 mg via INTRAVENOUS

## 2012-12-17 MED ORDER — SODIUM CHLORIDE 0.9 % IV SOLN
Freq: Once | INTRAVENOUS | Status: AC
  Administered 2012-12-17: 250 mL via INTRAVENOUS

## 2012-12-17 MED ORDER — SODIUM CHLORIDE 0.9 % IV SOLN
200.0000 mg | INTRAVENOUS | Status: DC
  Administered 2012-12-17: 200 mg via INTRAVENOUS
  Filled 2012-12-17: qty 16

## 2012-12-17 MED ORDER — DIPHENHYDRAMINE HCL 50 MG/ML IJ SOLN
INTRAMUSCULAR | Status: AC
  Filled 2012-12-17: qty 1

## 2012-12-17 NOTE — Progress Notes (Signed)
Results for [name], [name] (MRN [id number]) as of 12/17/2012 11:55  Patient came today for her Simponi Area 200mg  IV infusion tolerated well. Pt. had slip for a CBC & CMP for outpt. Lab. Kenard Gower this lab today, results enclosed. Pt. Had no complaints.  Ref. Range 12/17/2012 11:09  Sodium Latest Range: 135-145 mEq/L 135  Potassium Latest Range: 3.5-5.1 mEq/L 4.8  Chloride Latest Range: 96-112 mEq/L 97  CO2 Latest Range: 19-32 mEq/L 30  BUN Latest Range: 6-23 mg/dL 16  Creatinine Latest Range: 0.50-1.10 mg/dL 4.09  Calcium Latest Range: 8.4-10.5 mg/dL 9.2  GFR calc non Af Amer Latest Range: >90 mL/min 86 (L)  GFR calc Af Amer Latest Range: >90 mL/min >90  Glucose Latest Range: 70-99 mg/dL 811 (H)  Alkaline Phosphatase Latest Range: 39-117 U/L 105  Albumin Latest Range: 3.5-5.2 g/dL 3.6  AST Latest Range: 0-37 U/L 15  ALT Latest Range: 0-35 U/L 16  Total Protein Latest Range: 6.0-8.3 g/dL 7.1  Total Bilirubin Latest Range: 0.3-1.2 mg/dL 0.5  WBC Latest Range: 4.0-10.5 K/uL 5.4  RBC Latest Range: 3.87-5.11 MIL/uL 3.99  Hemoglobin Latest Range: 12.0-15.0 g/dL 91.4  HCT Latest Range: 36.0-46.0 % 38.0  MCV Latest Range: 78.0-100.0 fL 95.2  MCH Latest Range: 26.0-34.0 pg 31.1  MCHC Latest Range: 30.0-36.0 g/dL 78.2  RDW Latest Range: 11.5-15.5 % 16.8 (H)  Platelets Latest Range: 150-400 K/uL 68 (L)

## 2012-12-20 ENCOUNTER — Other Ambulatory Visit (HOSPITAL_COMMUNITY): Payer: Self-pay | Admitting: "Endocrinology

## 2012-12-20 DIAGNOSIS — E049 Nontoxic goiter, unspecified: Secondary | ICD-10-CM

## 2012-12-27 DIAGNOSIS — M999 Biomechanical lesion, unspecified: Secondary | ICD-10-CM | POA: Diagnosis not present

## 2012-12-27 DIAGNOSIS — S335XXA Sprain of ligaments of lumbar spine, initial encounter: Secondary | ICD-10-CM | POA: Diagnosis not present

## 2013-01-02 DIAGNOSIS — M069 Rheumatoid arthritis, unspecified: Secondary | ICD-10-CM | POA: Diagnosis not present

## 2013-01-02 DIAGNOSIS — Z79899 Other long term (current) drug therapy: Secondary | ICD-10-CM | POA: Diagnosis not present

## 2013-01-22 DIAGNOSIS — M999 Biomechanical lesion, unspecified: Secondary | ICD-10-CM | POA: Diagnosis not present

## 2013-01-22 DIAGNOSIS — S335XXA Sprain of ligaments of lumbar spine, initial encounter: Secondary | ICD-10-CM | POA: Diagnosis not present

## 2013-01-28 ENCOUNTER — Ambulatory Visit (HOSPITAL_COMMUNITY)
Admission: RE | Admit: 2013-01-28 | Discharge: 2013-01-28 | Disposition: A | Discharge status: Home / Self Care | Payer: Medicare Other | Source: Ambulatory Visit | Attending: "Endocrinology | Admitting: "Endocrinology

## 2013-01-28 DIAGNOSIS — E042 Nontoxic multinodular goiter: Secondary | ICD-10-CM | POA: Diagnosis not present

## 2013-01-28 DIAGNOSIS — E049 Nontoxic goiter, unspecified: Secondary | ICD-10-CM

## 2013-02-07 DIAGNOSIS — M719 Bursopathy, unspecified: Secondary | ICD-10-CM | POA: Diagnosis not present

## 2013-02-07 DIAGNOSIS — M67919 Unspecified disorder of synovium and tendon, unspecified shoulder: Secondary | ICD-10-CM | POA: Diagnosis not present

## 2013-02-07 DIAGNOSIS — L408 Other psoriasis: Secondary | ICD-10-CM | POA: Diagnosis not present

## 2013-02-07 DIAGNOSIS — M069 Rheumatoid arthritis, unspecified: Secondary | ICD-10-CM | POA: Diagnosis not present

## 2013-02-07 DIAGNOSIS — Z79899 Other long term (current) drug therapy: Secondary | ICD-10-CM | POA: Diagnosis not present

## 2013-02-11 ENCOUNTER — Encounter (HOSPITAL_COMMUNITY): Admission: RE | Admit: 2013-02-11 | Payer: Medicare Other | Source: Ambulatory Visit

## 2013-02-15 DIAGNOSIS — M069 Rheumatoid arthritis, unspecified: Secondary | ICD-10-CM | POA: Diagnosis not present

## 2013-02-15 DIAGNOSIS — Z79899 Other long term (current) drug therapy: Secondary | ICD-10-CM | POA: Diagnosis not present

## 2013-02-19 DIAGNOSIS — S335XXA Sprain of ligaments of lumbar spine, initial encounter: Secondary | ICD-10-CM | POA: Diagnosis not present

## 2013-02-19 DIAGNOSIS — M999 Biomechanical lesion, unspecified: Secondary | ICD-10-CM | POA: Diagnosis not present

## 2013-02-22 ENCOUNTER — Encounter: Payer: Self-pay | Admitting: Family Medicine

## 2013-03-11 DIAGNOSIS — T887XXA Unspecified adverse effect of drug or medicament, initial encounter: Secondary | ICD-10-CM | POA: Diagnosis not present

## 2013-03-11 DIAGNOSIS — M069 Rheumatoid arthritis, unspecified: Secondary | ICD-10-CM | POA: Diagnosis not present

## 2013-03-11 DIAGNOSIS — L408 Other psoriasis: Secondary | ICD-10-CM | POA: Diagnosis not present

## 2013-03-11 DIAGNOSIS — Z79899 Other long term (current) drug therapy: Secondary | ICD-10-CM | POA: Diagnosis not present

## 2013-03-19 DIAGNOSIS — S335XXA Sprain of ligaments of lumbar spine, initial encounter: Secondary | ICD-10-CM | POA: Diagnosis not present

## 2013-03-19 DIAGNOSIS — M999 Biomechanical lesion, unspecified: Secondary | ICD-10-CM | POA: Diagnosis not present

## 2013-03-28 DIAGNOSIS — Z6829 Body mass index (BMI) 29.0-29.9, adult: Secondary | ICD-10-CM | POA: Diagnosis not present

## 2013-03-28 DIAGNOSIS — IMO0001 Reserved for inherently not codable concepts without codable children: Secondary | ICD-10-CM | POA: Diagnosis not present

## 2013-04-16 DIAGNOSIS — M999 Biomechanical lesion, unspecified: Secondary | ICD-10-CM | POA: Diagnosis not present

## 2013-04-16 DIAGNOSIS — S335XXA Sprain of ligaments of lumbar spine, initial encounter: Secondary | ICD-10-CM | POA: Diagnosis not present

## 2013-05-14 DIAGNOSIS — S335XXA Sprain of ligaments of lumbar spine, initial encounter: Secondary | ICD-10-CM | POA: Diagnosis not present

## 2013-05-14 DIAGNOSIS — M999 Biomechanical lesion, unspecified: Secondary | ICD-10-CM | POA: Diagnosis not present

## 2013-05-15 DIAGNOSIS — Z79899 Other long term (current) drug therapy: Secondary | ICD-10-CM | POA: Diagnosis not present

## 2013-05-15 DIAGNOSIS — M069 Rheumatoid arthritis, unspecified: Secondary | ICD-10-CM | POA: Diagnosis not present

## 2013-06-03 DIAGNOSIS — M545 Low back pain, unspecified: Secondary | ICD-10-CM | POA: Diagnosis not present

## 2013-06-03 DIAGNOSIS — M069 Rheumatoid arthritis, unspecified: Secondary | ICD-10-CM | POA: Diagnosis not present

## 2013-06-03 DIAGNOSIS — M25549 Pain in joints of unspecified hand: Secondary | ICD-10-CM | POA: Diagnosis not present

## 2013-06-03 DIAGNOSIS — D649 Anemia, unspecified: Secondary | ICD-10-CM | POA: Diagnosis not present

## 2013-06-03 DIAGNOSIS — Z681 Body mass index (BMI) 19 or less, adult: Secondary | ICD-10-CM | POA: Diagnosis not present

## 2013-06-03 DIAGNOSIS — I10 Essential (primary) hypertension: Secondary | ICD-10-CM | POA: Diagnosis not present

## 2013-06-03 DIAGNOSIS — Z79899 Other long term (current) drug therapy: Secondary | ICD-10-CM | POA: Diagnosis not present

## 2013-06-03 DIAGNOSIS — IMO0001 Reserved for inherently not codable concepts without codable children: Secondary | ICD-10-CM | POA: Diagnosis not present

## 2013-06-11 DIAGNOSIS — M999 Biomechanical lesion, unspecified: Secondary | ICD-10-CM | POA: Diagnosis not present

## 2013-06-11 DIAGNOSIS — S335XXA Sprain of ligaments of lumbar spine, initial encounter: Secondary | ICD-10-CM | POA: Diagnosis not present

## 2013-07-09 DIAGNOSIS — S335XXA Sprain of ligaments of lumbar spine, initial encounter: Secondary | ICD-10-CM | POA: Diagnosis not present

## 2013-07-09 DIAGNOSIS — M999 Biomechanical lesion, unspecified: Secondary | ICD-10-CM | POA: Diagnosis not present

## 2013-07-25 DIAGNOSIS — M069 Rheumatoid arthritis, unspecified: Secondary | ICD-10-CM | POA: Diagnosis not present

## 2013-07-25 DIAGNOSIS — Z79899 Other long term (current) drug therapy: Secondary | ICD-10-CM | POA: Diagnosis not present

## 2013-08-05 DIAGNOSIS — M545 Low back pain, unspecified: Secondary | ICD-10-CM | POA: Diagnosis not present

## 2013-08-05 DIAGNOSIS — Z79899 Other long term (current) drug therapy: Secondary | ICD-10-CM | POA: Diagnosis not present

## 2013-08-05 DIAGNOSIS — M069 Rheumatoid arthritis, unspecified: Secondary | ICD-10-CM | POA: Diagnosis not present

## 2013-08-05 DIAGNOSIS — M25549 Pain in joints of unspecified hand: Secondary | ICD-10-CM | POA: Diagnosis not present

## 2013-08-06 DIAGNOSIS — M999 Biomechanical lesion, unspecified: Secondary | ICD-10-CM | POA: Diagnosis not present

## 2013-08-06 DIAGNOSIS — S335XXA Sprain of ligaments of lumbar spine, initial encounter: Secondary | ICD-10-CM | POA: Diagnosis not present

## 2013-09-03 ENCOUNTER — Inpatient Hospital Stay (HOSPITAL_COMMUNITY)
Admission: EM | Admit: 2013-09-03 | Discharge: 2013-09-06 | DRG: 190 | Disposition: A | Discharge status: Home / Self Care | Payer: Medicare Other | Attending: Family Medicine | Admitting: Family Medicine

## 2013-09-03 ENCOUNTER — Encounter (HOSPITAL_COMMUNITY): Payer: Self-pay | Admitting: Emergency Medicine

## 2013-09-03 ENCOUNTER — Emergency Department (HOSPITAL_COMMUNITY): Payer: Medicare Other

## 2013-09-03 ENCOUNTER — Inpatient Hospital Stay (HOSPITAL_COMMUNITY): Payer: Medicare Other

## 2013-09-03 DIAGNOSIS — D649 Anemia, unspecified: Secondary | ICD-10-CM | POA: Diagnosis present

## 2013-09-03 DIAGNOSIS — I10 Essential (primary) hypertension: Secondary | ICD-10-CM

## 2013-09-03 DIAGNOSIS — E1165 Type 2 diabetes mellitus with hyperglycemia: Secondary | ICD-10-CM | POA: Diagnosis present

## 2013-09-03 DIAGNOSIS — J0100 Acute maxillary sinusitis, unspecified: Secondary | ICD-10-CM | POA: Diagnosis present

## 2013-09-03 DIAGNOSIS — IMO0002 Reserved for concepts with insufficient information to code with codable children: Secondary | ICD-10-CM | POA: Diagnosis present

## 2013-09-03 DIAGNOSIS — I369 Nonrheumatic tricuspid valve disorder, unspecified: Secondary | ICD-10-CM | POA: Diagnosis not present

## 2013-09-03 DIAGNOSIS — R0989 Other specified symptoms and signs involving the circulatory and respiratory systems: Secondary | ICD-10-CM

## 2013-09-03 DIAGNOSIS — J209 Acute bronchitis, unspecified: Principal | ICD-10-CM

## 2013-09-03 DIAGNOSIS — I498 Other specified cardiac arrhythmias: Secondary | ICD-10-CM | POA: Diagnosis not present

## 2013-09-03 DIAGNOSIS — J440 Chronic obstructive pulmonary disease with acute lower respiratory infection: Principal | ICD-10-CM | POA: Diagnosis present

## 2013-09-03 DIAGNOSIS — IMO0001 Reserved for inherently not codable concepts without codable children: Secondary | ICD-10-CM | POA: Diagnosis present

## 2013-09-03 DIAGNOSIS — J9600 Acute respiratory failure, unspecified whether with hypoxia or hypercapnia: Secondary | ICD-10-CM | POA: Diagnosis present

## 2013-09-03 DIAGNOSIS — R0609 Other forms of dyspnea: Secondary | ICD-10-CM | POA: Diagnosis not present

## 2013-09-03 DIAGNOSIS — J441 Chronic obstructive pulmonary disease with (acute) exacerbation: Secondary | ICD-10-CM

## 2013-09-03 DIAGNOSIS — J984 Other disorders of lung: Secondary | ICD-10-CM | POA: Diagnosis not present

## 2013-09-03 DIAGNOSIS — R0602 Shortness of breath: Secondary | ICD-10-CM | POA: Diagnosis not present

## 2013-09-03 DIAGNOSIS — E872 Acidosis, unspecified: Secondary | ICD-10-CM | POA: Diagnosis present

## 2013-09-03 DIAGNOSIS — E118 Type 2 diabetes mellitus with unspecified complications: Secondary | ICD-10-CM

## 2013-09-03 DIAGNOSIS — M329 Systemic lupus erythematosus, unspecified: Secondary | ICD-10-CM | POA: Diagnosis present

## 2013-09-03 DIAGNOSIS — F17200 Nicotine dependence, unspecified, uncomplicated: Secondary | ICD-10-CM | POA: Diagnosis present

## 2013-09-03 DIAGNOSIS — D696 Thrombocytopenia, unspecified: Secondary | ICD-10-CM

## 2013-09-03 DIAGNOSIS — Z66 Do not resuscitate: Secondary | ICD-10-CM | POA: Diagnosis present

## 2013-09-03 DIAGNOSIS — J40 Bronchitis, not specified as acute or chronic: Secondary | ICD-10-CM | POA: Diagnosis not present

## 2013-09-03 DIAGNOSIS — M069 Rheumatoid arthritis, unspecified: Secondary | ICD-10-CM | POA: Diagnosis present

## 2013-09-03 DIAGNOSIS — J9601 Acute respiratory failure with hypoxia: Secondary | ICD-10-CM

## 2013-09-03 DIAGNOSIS — R0603 Acute respiratory distress: Secondary | ICD-10-CM

## 2013-09-03 DIAGNOSIS — E119 Type 2 diabetes mellitus without complications: Secondary | ICD-10-CM | POA: Diagnosis present

## 2013-09-03 HISTORY — DX: Thrombocytopenia, unspecified: D69.6

## 2013-09-03 LAB — CBC WITH DIFFERENTIAL/PLATELET
BASOS ABS: 0 10*3/uL (ref 0.0–0.1)
BASOS PCT: 0 % (ref 0–1)
Band Neutrophils: 0 % (ref 0–10)
Blasts: 0 %
Eosinophils Absolute: 0.2 10*3/uL (ref 0.0–0.7)
Eosinophils Relative: 3 % (ref 0–5)
HEMATOCRIT: 38.8 % (ref 36.0–46.0)
HEMOGLOBIN: 12.8 g/dL (ref 12.0–15.0)
Lymphocytes Relative: 27 % (ref 12–46)
Lymphs Abs: 1.8 10*3/uL (ref 0.7–4.0)
MCH: 31.9 pg (ref 26.0–34.0)
MCHC: 33 g/dL (ref 30.0–36.0)
MCV: 96.8 fL (ref 78.0–100.0)
METAMYELOCYTES PCT: 0 %
MYELOCYTES: 0 %
Monocytes Absolute: 1.6 10*3/uL — ABNORMAL HIGH (ref 0.1–1.0)
Monocytes Relative: 23 % — ABNORMAL HIGH (ref 3–12)
Neutro Abs: 3.2 10*3/uL (ref 1.7–7.7)
Neutrophils Relative %: 47 % (ref 43–77)
Platelets: 103 10*3/uL — ABNORMAL LOW (ref 150–400)
Promyelocytes Absolute: 0 %
RBC: 4.01 MIL/uL (ref 3.87–5.11)
RDW: 19.5 % — AB (ref 11.5–15.5)
WBC: 6.8 10*3/uL (ref 4.0–10.5)
nRBC: 0 /100 WBC

## 2013-09-03 LAB — BLOOD GAS, ARTERIAL
Acid-base deficit: 6.7 mmol/L — ABNORMAL HIGH (ref 0.0–2.0)
Bicarbonate: 19.1 mEq/L — ABNORMAL LOW (ref 20.0–24.0)
DRAWN BY: 25788
O2 CONTENT: 2 L/min
O2 Saturation: 89.2 %
PCO2 ART: 44.4 mmHg (ref 35.0–45.0)
Patient temperature: 37
TCO2: 17.7 mmol/L (ref 0–100)
pH, Arterial: 7.258 — ABNORMAL LOW (ref 7.350–7.450)
pO2, Arterial: 66.4 mmHg — ABNORMAL LOW (ref 80.0–100.0)

## 2013-09-03 LAB — TROPONIN I
Troponin I: <0.3 ng/mL (ref <0.30)
Troponin I: <0.3 ng/mL (ref <0.30)

## 2013-09-03 LAB — BASIC METABOLIC PANEL
ANION GAP: 15 (ref 5–15)
BUN: 17 mg/dL (ref 6–23)
CO2: 25 mEq/L (ref 19–32)
CREATININE: 0.87 mg/dL (ref 0.50–1.10)
Calcium: 9.8 mg/dL (ref 8.4–10.5)
Chloride: 92 mEq/L — ABNORMAL LOW (ref 96–112)
GFR calc non Af Amer: 65 mL/min — ABNORMAL LOW (ref >90)
GFR, EST AFRICAN AMERICAN: 75 mL/min — AB (ref >90)
Glucose, Bld: 167 mg/dL — ABNORMAL HIGH (ref 70–99)
Potassium: 4.2 mEq/L (ref 3.7–5.3)
SODIUM: 132 meq/L — AB (ref 137–147)

## 2013-09-03 LAB — GLUCOSE, CAPILLARY: Glucose-Capillary: 365 mg/dL — ABNORMAL HIGH (ref 70–99)

## 2013-09-03 LAB — PRO B NATRIURETIC PEPTIDE: Pro B Natriuretic peptide (BNP): 743.3 pg/mL — ABNORMAL HIGH (ref 0–125)

## 2013-09-03 LAB — MRSA PCR SCREENING: MRSA BY PCR: NEGATIVE

## 2013-09-03 MED ORDER — ONDANSETRON HCL 4 MG/2ML IJ SOLN
4.0000 mg | Freq: Four times a day (QID) | INTRAMUSCULAR | Status: DC | PRN
  Administered 2013-09-05 (×2): 4 mg via INTRAVENOUS
  Filled 2013-09-03 (×2): qty 2

## 2013-09-03 MED ORDER — LEVALBUTEROL HCL 0.63 MG/3ML IN NEBU
0.6300 mg | INHALATION_SOLUTION | RESPIRATORY_TRACT | Status: DC | PRN
  Filled 2013-09-03: qty 3

## 2013-09-03 MED ORDER — SODIUM CHLORIDE 0.9 % IJ SOLN
3.0000 mL | INTRAMUSCULAR | Status: DC | PRN
  Administered 2013-09-06: 3 mL via INTRAVENOUS

## 2013-09-03 MED ORDER — INSULIN ASPART 100 UNIT/ML ~~LOC~~ SOLN
0.0000 [IU] | Freq: Every day | SUBCUTANEOUS | Status: DC
  Administered 2013-09-03: 5 [IU] via SUBCUTANEOUS
  Administered 2013-09-05: 3 [IU] via SUBCUTANEOUS

## 2013-09-03 MED ORDER — ACETAMINOPHEN 325 MG PO TABS
650.0000 mg | ORAL_TABLET | Freq: Four times a day (QID) | ORAL | Status: DC | PRN
  Administered 2013-09-04 – 2013-09-06 (×2): 650 mg via ORAL
  Filled 2013-09-03 (×2): qty 2

## 2013-09-03 MED ORDER — METHYLPREDNISOLONE SODIUM SUCC 125 MG IJ SOLR
60.0000 mg | Freq: Four times a day (QID) | INTRAMUSCULAR | Status: DC
  Administered 2013-09-03 – 2013-09-04 (×3): 60 mg via INTRAVENOUS
  Filled 2013-09-03 (×3): qty 2

## 2013-09-03 MED ORDER — SODIUM CHLORIDE 0.9 % IJ SOLN
3.0000 mL | Freq: Two times a day (BID) | INTRAMUSCULAR | Status: DC
  Administered 2013-09-03 – 2013-09-06 (×6): 3 mL via INTRAVENOUS

## 2013-09-03 MED ORDER — ASPIRIN EC 81 MG PO TBEC
81.0000 mg | DELAYED_RELEASE_TABLET | Freq: Every day | ORAL | Status: DC
  Administered 2013-09-03 – 2013-09-06 (×4): 81 mg via ORAL
  Filled 2013-09-03 (×4): qty 1

## 2013-09-03 MED ORDER — SODIUM CHLORIDE 0.9 % IV SOLN: 250.0000 mL | INTRAVENOUS | Status: DC | PRN

## 2013-09-03 MED ORDER — IOHEXOL 350 MG/ML SOLN
100.0000 mL | Freq: Once | INTRAVENOUS | Status: AC | PRN
  Administered 2013-09-03: 100 mL via INTRAVENOUS

## 2013-09-03 MED ORDER — IPRATROPIUM BROMIDE 0.02 % IN SOLN
0.5000 mg | Freq: Once | RESPIRATORY_TRACT | Status: AC
  Administered 2013-09-03: 0.5 mg via RESPIRATORY_TRACT
  Filled 2013-09-03: qty 2.5

## 2013-09-03 MED ORDER — ENOXAPARIN SODIUM 40 MG/0.4ML ~~LOC~~ SOLN
40.0000 mg | SUBCUTANEOUS | Status: DC
  Administered 2013-09-03 – 2013-09-04 (×2): 40 mg via SUBCUTANEOUS
  Filled 2013-09-03 (×3): qty 0.4

## 2013-09-03 MED ORDER — METHOTREXATE 2.5 MG PO TABS
7.5000 mg | ORAL_TABLET | ORAL | Status: DC
  Administered 2013-09-03 – 2013-09-04 (×2): 7.5 mg via ORAL
  Filled 2013-09-03 (×2): qty 3

## 2013-09-03 MED ORDER — METHYLPREDNISOLONE SODIUM SUCC 125 MG IJ SOLR
125.0000 mg | Freq: Once | INTRAMUSCULAR | Status: AC
  Administered 2013-09-03: 125 mg via INTRAVENOUS
  Filled 2013-09-03: qty 2

## 2013-09-03 MED ORDER — LORAZEPAM 2 MG/ML IJ SOLN
INTRAMUSCULAR | Status: AC
  Filled 2013-09-03: qty 1

## 2013-09-03 MED ORDER — FERROUS SULFATE 325 (65 FE) MG PO TABS
325.0000 mg | ORAL_TABLET | Freq: Two times a day (BID) | ORAL | Status: DC
  Administered 2013-09-03 – 2013-09-06 (×6): 325 mg via ORAL
  Filled 2013-09-03 (×6): qty 1

## 2013-09-03 MED ORDER — CEFTRIAXONE SODIUM 1 G IJ SOLR
1.0000 g | Freq: Once | INTRAMUSCULAR | Status: AC
  Administered 2013-09-03: 1 g via INTRAVENOUS
  Filled 2013-09-03: qty 10

## 2013-09-03 MED ORDER — GUAIFENESIN ER 600 MG PO TB12
1200.0000 mg | ORAL_TABLET | Freq: Two times a day (BID) | ORAL | Status: DC
  Administered 2013-09-03 – 2013-09-05 (×4): 1200 mg via ORAL
  Filled 2013-09-03 (×5): qty 2

## 2013-09-03 MED ORDER — INSULIN ASPART 100 UNIT/ML ~~LOC~~ SOLN
0.0000 [IU] | Freq: Three times a day (TID) | SUBCUTANEOUS | Status: DC
  Administered 2013-09-04: 2 [IU] via SUBCUTANEOUS
  Administered 2013-09-04: 5 [IU] via SUBCUTANEOUS
  Administered 2013-09-04: 3 [IU] via SUBCUTANEOUS
  Administered 2013-09-05: 8 [IU] via SUBCUTANEOUS
  Administered 2013-09-05: 2 [IU] via SUBCUTANEOUS
  Administered 2013-09-05 – 2013-09-06 (×2): 3 [IU] via SUBCUTANEOUS
  Administered 2013-09-06: 15 [IU] via SUBCUTANEOUS

## 2013-09-03 MED ORDER — DEXTROSE 5 % IV SOLN
500.0000 mg | INTRAVENOUS | Status: DC
  Filled 2013-09-03: qty 500

## 2013-09-03 MED ORDER — LEVALBUTEROL HCL 0.63 MG/3ML IN NEBU
0.6300 mg | INHALATION_SOLUTION | RESPIRATORY_TRACT | Status: DC
  Administered 2013-09-03 (×2): 0.63 mg via RESPIRATORY_TRACT
  Filled 2013-09-03 (×2): qty 3

## 2013-09-03 MED ORDER — ALBUTEROL (5 MG/ML) CONTINUOUS INHALATION SOLN
10.0000 mg/h | INHALATION_SOLUTION | Freq: Once | RESPIRATORY_TRACT | Status: AC
  Administered 2013-09-03: 10 mg/h via RESPIRATORY_TRACT
  Filled 2013-09-03: qty 20

## 2013-09-03 MED ORDER — DEXTROSE 5 % IV SOLN
500.0000 mg | INTRAVENOUS | Status: DC
  Administered 2013-09-03: 500 mg via INTRAVENOUS
  Filled 2013-09-03 (×2): qty 500

## 2013-09-03 MED ORDER — FOLIC ACID 1 MG PO TABS
1.0000 mg | ORAL_TABLET | Freq: Every day | ORAL | Status: DC
  Administered 2013-09-03 – 2013-09-06 (×4): 1 mg via ORAL
  Filled 2013-09-03 (×4): qty 1

## 2013-09-03 MED ORDER — LORAZEPAM 2 MG/ML IJ SOLN
1.0000 mg | Freq: Once | INTRAMUSCULAR | Status: AC
  Administered 2013-09-03: 13:00:00 via INTRAVENOUS

## 2013-09-03 MED ORDER — ONDANSETRON HCL 4 MG PO TABS: 4.0000 mg | ORAL_TABLET | Freq: Four times a day (QID) | ORAL | Status: DC | PRN

## 2013-09-03 MED ORDER — IPRATROPIUM BROMIDE 0.02 % IN SOLN
0.5000 mg | RESPIRATORY_TRACT | Status: DC
  Administered 2013-09-03 (×2): 0.5 mg via RESPIRATORY_TRACT
  Filled 2013-09-03 (×2): qty 2.5

## 2013-09-03 MED ORDER — ACETAMINOPHEN 650 MG RE SUPP: 650.0000 mg | Freq: Four times a day (QID) | RECTAL | Status: DC | PRN

## 2013-09-03 MED ORDER — LORAZEPAM 1 MG PO TABS: 1.0000 mg | ORAL_TABLET | Freq: Once | ORAL | Status: DC

## 2013-09-03 MED ORDER — METOPROLOL TARTRATE 25 MG PO TABS
25.0000 mg | ORAL_TABLET | Freq: Two times a day (BID) | ORAL | Status: DC
  Administered 2013-09-03 – 2013-09-06 (×4): 25 mg via ORAL
  Filled 2013-09-03 (×6): qty 1

## 2013-09-03 MED ORDER — DEXTROSE 5 % IV SOLN
1.0000 g | INTRAVENOUS | Status: DC
  Administered 2013-09-04 – 2013-09-06 (×3): 1 g via INTRAVENOUS
  Filled 2013-09-03 (×6): qty 10

## 2013-09-03 MED ORDER — DEXTROSE 5 % IV SOLN
500.0000 mg | INTRAVENOUS | Status: DC
  Administered 2013-09-04: 500 mg via INTRAVENOUS
  Filled 2013-09-03 (×3): qty 500

## 2013-09-03 NOTE — ED Notes (Signed)
Patient stood from wheelchair and took approximately 4 steps to stretcher. Became very short of breath on exertion, rhonchi noted on ascultation. Oxygen sats decreased to 86% on RA with exertion. O2 2 liters [place] applied, oxygen saturation increased to 92%.

## 2013-09-03 NOTE — ED Notes (Signed)
Patient work of breathing better with bipap. Oxygen saturations increased to 95%. Dentures at bedside in labeled denture cup. Patient remains alert/oriented x4.

## 2013-09-03 NOTE — ED Notes (Signed)
Patient on bipap at this time. Lab at bedside drawing cultures.

## 2013-09-03 NOTE — ED Provider Notes (Signed)
CSN: [id number]     Arrival date & time 09/03/13  1047 History  This chart was scribed for Merryl Hacker, MD by Ludger Nutting, ED Scribe. This patient was seen in room APA19/APA19 and the patient's care was started 11:31 AM.    Chief Complaint  Patient presents with  . Cough    The history is provided by the patient. No language interpreter was used.    HPI Comments: [name] is a 73 y.o. female with reported history past medical history of COPD who presents to the Emergency Department complaining of 4 days of gradual onset, gradually worsening, intermittent cough with associated chest congestion, SOB, and mild sputum production. Patient reports a history of COPD and uses a nebulizer breathing treatment 3-4 times per day without relief. She states the SOB is worse with exertion, deep breathing, and coughing. She denies chest pain. She is a 2ppd smoker. Denies orthopnea.  Chills without fever at home.  Past Medical History  Diagnosis Date  . Diabetes mellitus     x 20 yrs  . Lupus     sle  . Fibromyalgia   . RA (rheumatoid arthritis)     ra   Past Surgical History  Procedure Laterality Date  . Appendectomy    . Cesarean section  x3  . Oophorectomy    . Cataract extraction w/ intraocular lens implant  2010    right eye  . Abdominal hysterectomy    . Left arm surgery      multiple surgeries on  . Trigger finger release  yrs ago    right hand  . Knee arthroscopy  yrs ago    left knee  . Shoulder hemi-arthroplasty  01/27/2011    Procedure: SHOULDER HEMI-ARTHROPLASTY;  Surgeon: Nita Sells, MD;  Location: [facility];  Service: Orthopedics;  Laterality: Right;  interscaline right shoulder   No family history on file. History  Substance Use Topics  . Smoking status: Current Every Day Smoker -- 1.00 packs/day for 25 years  . Smokeless tobacco: Not on file     Comment: 2 packs a day x 30 yrs  . Alcohol Use: No   OB History   Grav Para Term Preterm Abortions TAB  SAB Ect Mult Living                 Review of Systems  Constitutional: Positive for chills. Negative for fever.  Respiratory: Positive for cough and shortness of breath. Negative for chest tightness.   Cardiovascular: Negative for chest pain, palpitations and leg swelling.  Gastrointestinal: Negative for nausea, vomiting and abdominal pain.  Genitourinary: Negative for dysuria.  Musculoskeletal: Negative for back pain.  Neurological: Negative for headaches.  Psychiatric/Behavioral: Negative for confusion.  All other systems reviewed and are negative.     Allergies  Review of patient's allergies indicates no known allergies.  Home Medications   Prior to Admission medications   Medication Sig Start Date End Date Taking? Authorizing Provider  acyclovir (ZOVIRAX) 400 MG tablet Take 1 tablet (400 mg total) by mouth 4 (four) times daily. 02/04/12   Sharyon Cable, MD  alendronate (FOSAMAX) 70 MG tablet Take 1 tablet by mouth Once a week. On Fridays 04/15/11   Historical Provider, MD  cholecalciferol (VITAMIN D) 1000 UNITS tablet Take 1,000 Units by mouth 2 (two) times daily.     Historical Provider, MD  folic acid (FOLVITE) 1 MG tablet Take 1 mg by mouth daily.    Historical Provider, MD  gabapentin (NEURONTIN) 300 MG capsule Take 300 mg by mouth 2 (two) times daily.    Historical Provider, MD  glyBURIDE-metformin (GLUCOVANCE) 5-500 MG per tablet Take 1 tablet by mouth Twice daily. 05/02/11   Historical Provider, MD  lisinopril-hydrochlorothiazide (PRINZIDE,ZESTORETIC) 10-12.5 MG per tablet Take 1 tablet by mouth Daily. 05/02/11   Historical Provider, MD  metFORMIN (GLUCOPHAGE) 500 MG tablet Take 500 mg by mouth 2 (two) times daily with a meal.     Historical Provider, MD  methotrexate (RHEUMATREX) 7.5 MG tablet Take 7.5 mg by mouth 2 (two) times a week. Caution" Chemotherapy. Protect from light. Takes on Tuesday and wednesday    Historical Provider, MD  metoprolol tartrate (LOPRESSOR) 25  MG tablet Take 25 mg by mouth 2 (two) times daily.     Historical Provider, MD  oxyCODONE-acetaminophen (PERCOCET/ROXICET) 5-325 MG per tablet Take 1 tablet by mouth every 8 (eight) hours as needed for pain. 02/04/12   Sharyon Cable, MD  oxyCODONE-acetaminophen (TYLOX) 5-500 MG per capsule Take 1 capsule by mouth every 4 (four) hours as needed. For pain    Historical Provider, MD  predniSONE (DELTASONE) 5 MG tablet Take 5 mg by mouth Daily. 05/02/11   Historical Provider, MD  promethazine (PHENERGAN) 25 MG tablet Take 25 mg by mouth every 6 (six) hours as needed. For nausea    Historical Provider, MD   BP 144/54  Pulse 152  Temp(Src) 98.4 F (36.9 C) (Oral)  Resp 31  Ht 5' 3.5" (1.613 m)  Wt 152 lb (68.947 kg)  BMI 26.50 kg/m2  SpO2 96% Physical Exam  Nursing note and vitals reviewed. Constitutional: She is oriented to person, place, and time.  Elderly, nasal cannula in place, speaking in short sentences, mild respiratory distress  HENT:  Head: Normocephalic and atraumatic.  Eyes: Pupils are equal, round, and reactive to light.  Neck: Neck supple. No JVD present.  Cardiovascular: Normal rate, regular rhythm and normal heart sounds.   Pulmonary/Chest: She is in respiratory distress. She has no wheezes.  Coarse breath sounds bilaterally, poor air movement, end expiratory wheezing, increased work of breathing  Abdominal: Soft. Bowel sounds are normal. There is no tenderness. There is no rebound.  Musculoskeletal: She exhibits no edema.  Neurological: She is alert and oriented to person, place, and time.  Skin: Skin is warm and dry.  Psychiatric: She has a normal mood and affect.    ED Course  Procedures (including critical care time)  CRITICAL CARE Performed by: Thayer Jew, F   Total critical care time: 45 min  Critical care time was exclusive of separately billable procedures and treating other patients.  Critical care was necessary to treat or prevent imminent or  life-threatening deterioration.  Critical care was time spent personally by me on the following activities: development of treatment plan with patient and/or surrogate as well as nursing, discussions with consultants, evaluation of patient's response to treatment, examination of patient, obtaining history from patient or surrogate, ordering and performing treatments and interventions, ordering and review of laboratory studies, ordering and review of radiographic studies, pulse oximetry and re-evaluation of patient's condition.   DIAGNOSTIC STUDIES: Oxygen Saturation is 96% on RA, adequate by my interpretation.    COORDINATION OF CARE: 11:35 AM Discussed treatment plan with pt at bedside and pt agreed to plan.   Labs Review Labs Reviewed  CBC WITH DIFFERENTIAL - Abnormal; Notable for the following:    RDW 19.5 (*)    Platelets 103 (*)  Monocytes Relative 23 (*)    Monocytes Absolute 1.6 (*)    All other components within normal limits  BASIC METABOLIC PANEL - Abnormal; Notable for the following:    Sodium 132 (*)    Chloride 92 (*)    Glucose, Bld 167 (*)    GFR calc non Af Amer 65 (*)    GFR calc Af Amer 75 (*)    All other components within normal limits  PRO B NATRIURETIC PEPTIDE - Abnormal; Notable for the following:    Pro B Natriuretic peptide (BNP) 743.3 (*)    All other components within normal limits  BLOOD GAS, ARTERIAL - Abnormal; Notable for the following:    pH, Arterial 7.258 (*)    pO2, Arterial 66.4 (*)    Bicarbonate 19.1 (*)    Acid-base deficit 6.7 (*)    All other components within normal limits  CULTURE, BLOOD (ROUTINE X 2)  CULTURE, BLOOD (ROUTINE X 2)  TROPONIN I    Imaging Review Dg Chest Portable 1 View  09/03/2013   CLINICAL DATA:  Cough and shortness of breath.  EXAM: PORTABLE CHEST - 1 VIEW  COMPARISON:  01/13/2011  FINDINGS: Single view of the chest demonstrates prominent interstitial lung markings and difficult to exclude mild edema. Subtle  density at the left lung base is nonspecific. Heart size is normal. Right shoulder arthroplasty. Trachea is midline.  IMPRESSION: Prominent interstitial lung markings and question mild interstitial edema.  Vague density at the left lung base is nonspecific. This could represent overlying shadows or atelectasis. Consider a follow-up chest radiograph with attention to this area.   Electronically Signed   By: Markus Daft M.D.   On: 09/03/2013 11:50     EKG Interpretation   Date/Time:  Tuesday September 03 2013 11:16:43 EDT Ventricular Rate:  109 PR Interval:  134 QRS Duration: 73 QT Interval:  340 QTC Calculation: 458 R Axis:   74 Text Interpretation:  Sinus tachycardia Consider left atrial enlargement  Consider anterior infarct Nonspecific changes when compared to prior  Confirmed by HORTON  MD, Loma Sousa ([id number]) on 09/03/2013 12:36:30 PM      MDM   Final diagnoses:  Respiratory distress  COPD exacerbation    Patient presents with cough, shortness of breath. She has increased work of breathing and mild respiratory distress on exam. She drops her oxygen sats to the mid 80s upon standing. Patient was placed on supplemental oxygen.  Exam with poor air movement and wheezing. No documentation of COPD or congestive heart failure in our chart. Basic labwork was sent. Patient was placed on continuous DuoNeb.  No evidence of leukocytosis, mild hyponatremia.  Chest x-ray with mild interstitial edema but no infiltrate. After neb, patient reports increased anxiety. Heart rate is 150. She has increased work of breathing. She has better air movement following neb but is diffusely wheezy. ABG was ordered. Given the acuity of her condition, blood cultures were obtained and patient was given community acquired pneumonia antibiotics including Rocephin and azithromycin. She was placed on BiPAP given her increased work of breathing. ABG with evidence of normal gap acidosis and hypoxia with PO2 of 66.4.    Will admit  for further evaluation and management. Patient likely needs an echo to rule out any element of heart failure.  I personally performed the services described in this documentation, which was scribed in my presence. The recorded information has been reviewed and is accurate.   Merryl Hacker, MD 09/03/13 [phone]

## 2013-09-03 NOTE — ED Notes (Signed)
Assisted patient to restroom. Patient with increased work of breathing. Wheezing and rhonchi noted. Tachycardia at 150. Oxygen 88-91% on 2 liters 

## 2013-09-03 NOTE — H&P (Signed)
[facility] History and Physical  [name]:[id number] DOB: 28-Jan-1940 DOA: 09/03/2013  Referring physician: Dr. Dina Rich, ER physician PCP: [name]., MD   Chief Complaint: shortness of breath  HPI: [name] is a 73 y.o. female who presents to the emergency room with complaints of shortness of breath. She reports that for the past 3 days she generally felt unwell. Yesterday, she developed onset of shortness of breath which has progressively gotten worse. She has had a nonproductive cough. She is unsure whether she's had any fevers. She also describes some sinus tenderness. She's had some nausea but no vomiting. No diarrhea. Her appetite has decreased. She's not had any rashes. She denies any chest pain. She was evaluated in the emergency room where she was noted to be significantly hypoxic on room air, and tachycardic. Chest x-ray indicated questionable mild interstitial edema. The patient initially had increased work of breathing and was placed on BiPAP in the emergency room. She has been admitted to the hospital for further treatment. Since she smokes 2 packs of cigarettes a day, it was felt that she may have an underlying COPD exacerbation. She was given a dose of steroids and antibiotics and was admitted to the hospital for further treatments.   Review of Systems:  Pertinent positives as per HPI, otherwise negative  Past Medical History  Diagnosis Date  . Diabetes mellitus     x 20 yrs  . Lupus     sle  . Fibromyalgia   . RA (rheumatoid arthritis)     ra   Past Surgical History  Procedure Laterality Date  . Appendectomy    . Cesarean section  x3  . Oophorectomy    . Cataract extraction w/ intraocular lens implant  2010    right eye  . Abdominal hysterectomy    . Left arm surgery      multiple surgeries on  . Trigger finger release  yrs ago    right hand  . Knee arthroscopy  yrs ago    left knee  . Shoulder hemi-arthroplasty  01/27/2011   Procedure: SHOULDER HEMI-ARTHROPLASTY;  Surgeon: Nita Sells, MD;  Location: [facility];  Service: Orthopedics;  Laterality: Right;  interscaline right shoulder   Social History:  reports that she has been smoking.  She does not have any smokeless tobacco history on file. She reports that she does not drink alcohol or use illicit drugs.  Not on File  Family history: father committed suicide due to complications of alcohol.  Mother is going to turn 30 this year   Prior to Admission medications   Medication Sig Start Date End Date Taking? Authorizing Provider  alendronate (FOSAMAX) 70 MG tablet Take 1 tablet by mouth Once a week. On Fridays 04/15/11  Yes Historical Provider, MD  cholecalciferol (VITAMIN D) 1000 UNITS tablet Take 1,000 Units by mouth 2 (two) times daily.    Yes Historical Provider, MD  ferrous sulfate 325 (65 FE) MG tablet Take 325 mg by mouth 2 (two) times daily with a meal.   Yes Historical Provider, MD  folic acid (FOLVITE) 1 MG tablet Take 1 mg by mouth daily.   Yes Historical Provider, MD  glyBURIDE (DIABETA) 5 MG tablet Take 5 mg by mouth 2 (two) times daily with a meal.   Yes Historical Provider, MD  metFORMIN (GLUCOPHAGE) 500 MG tablet Take 500 mg by mouth 2 (two) times daily with a meal.    Yes Historical Provider, MD  methotrexate 2.5 MG  tablet Take 7.5 mg by mouth 2 (two) times a week.   Yes Historical Provider, MD  metoprolol tartrate (LOPRESSOR) 25 MG tablet Take 25 mg by mouth 2 (two) times daily.    Yes Historical Provider, MD   Physical Exam: Filed Vitals:   09/03/13 1600 09/03/13 1700 09/03/13 1800 09/03/13 1900  BP: 107/32 101/36 94/50 120/32  Pulse: 133 125 128 111  Temp: 98.2 F (36.8 C)     TempSrc: Oral     Resp: 25 23 26 20   Height:      Weight:      SpO2: 94% 95% 95% 95%    Wt Readings from Last 3 Encounters:  09/03/13 68.947 kg (152 lb)  12/17/12 72.576 kg (160 lb)  10/19/12 74.844 kg (165 lb)    General:  She is not in any  distress, she does have mild increased work of breathing Eyes: PERRL, normal lids, irises & conjunctiva ENT: tenderness over maxillary sinuses bilaterally Neck: no LAD, masses or thyromegaly Cardiovascular: s1, s2, tachycardic, no m/r/g. No LE edema. Respiratory: diminished breath sounds bilaterally, no w/r/r. Mildly increased respiratory effort. Abdomen: soft, ntnd Skin: no rash or induration seen on limited exam Musculoskeletal: grossly normal tone BUE/BLE Psychiatric: grossly normal mood and affect, speech fluent and appropriate Neurologic: grossly non-focal.          Labs on Admission:  Basic Metabolic Panel:  Recent Labs Lab 09/03/13 1115  NA 132*  K 4.2  CL 92*  CO2 25  GLUCOSE 167*  BUN 17  CREATININE 0.87  CALCIUM 9.8   Liver Function Tests: No results found for this basename: AST, ALT, ALKPHOS, BILITOT, PROT, ALBUMIN,  in the last 168 hours No results found for this basename: LIPASE, AMYLASE,  in the last 168 hours No results found for this basename: AMMONIA,  in the last 168 hours CBC:  Recent Labs Lab 09/03/13 1115  WBC 6.8  NEUTROABS 3.2  HGB 12.8  HCT 38.8  MCV 96.8  PLT 103*   Cardiac Enzymes:  Recent Labs Lab 09/03/13 1115 09/03/13 1735  TROPONINI <0.30 <0.30    BNP (last 3 results)  Recent Labs  09/03/13 1115  PROBNP 743.3*   CBG: No results found for this basename: GLUCAP,  in the last 168 hours  Radiological Exams on Admission: Dg Chest Portable 1 View  09/03/2013   CLINICAL DATA:  Cough and shortness of breath.  EXAM: PORTABLE CHEST - 1 VIEW  COMPARISON:  01/13/2011  FINDINGS: Single view of the chest demonstrates prominent interstitial lung markings and difficult to exclude mild edema. Subtle density at the left lung base is nonspecific. Heart size is normal. Right shoulder arthroplasty. Trachea is midline.  IMPRESSION: Prominent interstitial lung markings and question mild interstitial edema.  Vague density at the left lung base  is nonspecific. This could represent overlying shadows or atelectasis. Consider a follow-up chest radiograph with attention to this area.   Electronically Signed   By: Markus Daft M.D.   On: 09/03/2013 11:50    EKG: Independently reviewed. Sinus tachycardia  Assessment/Plan Active Problems:   Rheumatoid arthritis(714.0)   COPD exacerbation   Acute respiratory failure with hypoxia   Type II or unspecified type diabetes mellitus with unspecified complication, uncontrolled   Essential hypertension   Tobacco use disorder   Sinusitis, acute maxillary   Acute respiratory failure   1. Acute respiratory failure. Possibly related to COPD exacerbation, possible CHF. The patient does feel mildly improved with nebulizer treatments and steroids.  With her cough and sinus tenderness, certainly upper respiratory tract infection could be playing a role. In the subtenon unit, she's not requiring BiPAP therapy appears to be somewhat improved. We will continue with current treatments and wean down oxygen as tolerated. Check echocardiogram to assess ejection fraction since BNP was mildly elevated. With the sudden onset of hypoxia and tachycardia, also with an underlying history of connective tissue disease, will have to rule out underlying pulmonary embolus. We'll order CT angiogram of the chest.  2. Probable COPD exacerbation. Continue the patient on antibiotics, steroids, nebulizer treatments. She would likely benefit from formal pulmonary function tests once her acute illness has resolved. 3. History rheumatoid arthritis. Continue the patient on methotrexate. 4. Acute sinusitis. She's been treated with appropriate antibiotics. 5. Diabetes. Hold oral agents and continue sliding scale insulin 6. Tobacco use. Counseled on the importance of tobacco cessation 7. Hypertension. Continue outpatient regimen  Code Status: DNR DVT Prophylaxis: lovenox Family Communication: no family present Disposition Plan: discharge  home once improved  Time spent: 81mins  Ramsha Lonigro [facility] Pager [phone]  **Disclaimer: This note may have been dictated with voice recognition software. Similar sounding words can inadvertently be transcribed and this note may contain transcription errors which may not have been corrected upon publication of note.**

## 2013-09-03 NOTE — ED Notes (Signed)
Pt c/o cough that is non productive, chest congestion, chest pain, sob, facial pressure, generalized weakness, no appetite that started two days ago,

## 2013-09-03 NOTE — ED Notes (Signed)
Attempted to call report. RN to call back. 

## 2013-09-04 ENCOUNTER — Encounter (HOSPITAL_COMMUNITY): Payer: Self-pay | Admitting: Family Medicine

## 2013-09-04 DIAGNOSIS — D696 Thrombocytopenia, unspecified: Secondary | ICD-10-CM

## 2013-09-04 DIAGNOSIS — D649 Anemia, unspecified: Secondary | ICD-10-CM

## 2013-09-04 DIAGNOSIS — I369 Nonrheumatic tricuspid valve disorder, unspecified: Secondary | ICD-10-CM

## 2013-09-04 DIAGNOSIS — J209 Acute bronchitis, unspecified: Secondary | ICD-10-CM

## 2013-09-04 HISTORY — DX: Thrombocytopenia, unspecified: D69.6

## 2013-09-04 LAB — GLUCOSE, CAPILLARY
GLUCOSE-CAPILLARY: 103 mg/dL — AB (ref 70–99)
Glucose-Capillary: 162 mg/dL — ABNORMAL HIGH (ref 70–99)
Glucose-Capillary: 136 mg/dL — ABNORMAL HIGH (ref 70–99)
Glucose-Capillary: 210 mg/dL — ABNORMAL HIGH (ref 70–99)

## 2013-09-04 LAB — BASIC METABOLIC PANEL
Anion gap: 16 — ABNORMAL HIGH (ref 5–15)
BUN: 25 mg/dL — AB (ref 6–23)
CHLORIDE: 91 meq/L — AB (ref 96–112)
CO2: 25 meq/L (ref 19–32)
Calcium: 9.1 mg/dL (ref 8.4–10.5)
Creatinine, Ser: 1.01 mg/dL (ref 0.50–1.10)
GFR calc Af Amer: 63 mL/min — ABNORMAL LOW (ref >90)
GFR calc non Af Amer: 54 mL/min — ABNORMAL LOW (ref >90)
Glucose, Bld: 181 mg/dL — ABNORMAL HIGH (ref 70–99)
Potassium: 3.6 mEq/L — ABNORMAL LOW (ref 3.7–5.3)
Sodium: 132 mEq/L — ABNORMAL LOW (ref 137–147)

## 2013-09-04 LAB — CBC
HEMATOCRIT: 31 % — AB (ref 36.0–46.0)
HEMOGLOBIN: 10.4 g/dL — AB (ref 12.0–15.0)
MCH: 32 pg (ref 26.0–34.0)
MCHC: 33.5 g/dL (ref 30.0–36.0)
MCV: 95.4 fL (ref 78.0–100.0)
Platelets: 98 10*3/uL — ABNORMAL LOW (ref 150–400)
RBC: 3.25 MIL/uL — ABNORMAL LOW (ref 3.87–5.11)
RDW: 19 % — ABNORMAL HIGH (ref 11.5–15.5)
WBC: 3.8 10*3/uL — ABNORMAL LOW (ref 4.0–10.5)

## 2013-09-04 LAB — TSH: TSH: 0.715 u[IU]/mL (ref 0.350–4.500)

## 2013-09-04 LAB — TROPONIN I: Troponin I: <0.3 ng/mL (ref <0.30)

## 2013-09-04 MED ORDER — IPRATROPIUM BROMIDE 0.02 % IN SOLN
0.5000 mg | Freq: Four times a day (QID) | RESPIRATORY_TRACT | Status: DC
  Administered 2013-09-04 – 2013-09-06 (×8): 0.5 mg via RESPIRATORY_TRACT
  Filled 2013-09-04 (×8): qty 2.5

## 2013-09-04 MED ORDER — POTASSIUM CHLORIDE CRYS ER 20 MEQ PO TBCR
40.0000 meq | EXTENDED_RELEASE_TABLET | Freq: Once | ORAL | Status: AC
  Administered 2013-09-04: 40 meq via ORAL
  Filled 2013-09-04 (×2): qty 2

## 2013-09-04 MED ORDER — ENSURE COMPLETE PO LIQD
237.0000 mL | Freq: Two times a day (BID) | ORAL | Status: DC
  Administered 2013-09-04 – 2013-09-06 (×2): 237 mL via ORAL

## 2013-09-04 MED ORDER — METHYLPREDNISOLONE SODIUM SUCC 125 MG IJ SOLR
60.0000 mg | Freq: Two times a day (BID) | INTRAMUSCULAR | Status: DC
  Administered 2013-09-04: 60 mg via INTRAVENOUS
  Administered 2013-09-05: 125 mg via INTRAVENOUS
  Administered 2013-09-05 – 2013-09-06 (×2): 60 mg via INTRAVENOUS
  Filled 2013-09-04 (×4): qty 2

## 2013-09-04 MED ORDER — LEVALBUTEROL HCL 0.63 MG/3ML IN NEBU
0.6300 mg | INHALATION_SOLUTION | Freq: Four times a day (QID) | RESPIRATORY_TRACT | Status: DC
  Administered 2013-09-04 – 2013-09-06 (×8): 0.63 mg via RESPIRATORY_TRACT
  Filled 2013-09-04 (×7): qty 3

## 2013-09-04 NOTE — Progress Notes (Signed)
UR chart review completed.  

## 2013-09-04 NOTE — Progress Notes (Signed)
  Echocardiogram 2D Echocardiogram has been performed.  [place], [place] 09/04/2013, 1:44 PM

## 2013-09-04 NOTE — Progress Notes (Signed)
Patient has BIPAP ordered QHS, patient refuses to wear at this time. Patient is resting comfortably at this time, RT will continue to monitor.

## 2013-09-04 NOTE — Progress Notes (Signed)
INITIAL NUTRITION ASSESSMENT  DOCUMENTATION CODES Per approved criteria  -Not Applicable   INTERVENTION: Ensure Complete po BID, each supplement provides 350 kcal and 13 grams of protein   NUTRITION DIAGNOSIS: Inadequate oral intake related to self-limiting food choices as evidenced by pt diet hx.   Goal: Pt will maintain energy and protein intake to meet estimated needs   Monitor: Meal intake food and beverages, changes in status   Reason for Assessment: Malnutrition Screen Score =  2  73 y.o. female  ASSESSMENT:  Pt has hx of RA (rheumatoid arthritis), acute respiratory failure and probable COPD exacerbation. Pt describes appetite as fair. Her food  Choices have been limited due to decreased appetite. She says " I put food in my mouth but just can't eat it". She has been tolerating eggs, buttermilk and chocolate ice cream. She has been limiting protein foods per diet hx. Pt has not been drinking nutrition supplements but has been taking vitamin D and Iron daily.  She has no visible edema to lower extremities. Mild wasting to temporal region. Chronic inflammation present with RA and Lupus which increases her risk for malnutrition.  Her current weight is within usual body weight range but weight loss may be masked related to possible CHF. Will add oral supplement and continue to follow po's.  Height: Ht Readings from Last 1 Encounters:  09/03/13 5' 3.5" (1.613 m)    Weight: Wt Readings from Last 1 Encounters:  09/04/13 153 lb 7 oz (69.6 kg)    Ideal Body Weight: 120#  (54.5 kg)  % Ideal Body Weight: 128%  Wt Readings from Last 10 Encounters:  09/04/13 153 lb 7 oz (69.6 kg)  12/17/12 160 lb (72.576 kg)  10/19/12 165 lb (74.844 kg)  08/21/12 160 lb 2 oz (72.632 kg)  06/26/12 156 lb 8.4 oz (71 kg)  05/29/12 156 lb 8.4 oz (71 kg)  02/05/12 145 lb (65.772 kg)  02/04/12 156 lb (70.761 kg)  07/06/11 151 lb 14.4 oz (68.901 kg)  06/15/11 151 lb (68.493 kg)    Usual Body  Weight: 142-155# per pt  % Usual Body Weight: 100%  BMI:  Body mass index is 26.75 kg/(m^2). overweight  Estimated Nutritional Needs: Kcal: 1450-1700 Protein: 70-80 gr Fluid: per MD goals  Skin: intact  Diet Order:   Heart Healthy/CHO modified  EDUCATION NEEDS: -No education needs identified at this time   Intake/Output Summary (Last 24 hours) at 09/04/13 1028 Last data filed at 09/04/13 0700  Gross per 24 hour  Intake    543 ml  Output   1150 ml  Net   -607 ml    Last BM: 09/01/13  Labs:   Recent Labs Lab 09/03/13 1115 09/04/13 0447  NA 132* 132*  K 4.2 3.6*  CL 92* 91*  CO2 25 25  BUN 17 25*  CREATININE 0.87 1.01  CALCIUM 9.8 9.1  GLUCOSE 167* 181*    CBG (last 3)   Recent Labs  09/03/13 2123 09/04/13 0724  GLUCAP 365* 162*    Scheduled Meds: . aspirin EC  81 mg Oral Daily  . azithromycin  500 mg Intravenous Q24H  . azithromycin  500 mg Intravenous Q24H  . cefTRIAXone (ROCEPHIN)  IV  1 g Intravenous Q24H  . enoxaparin (LOVENOX) injection  40 mg Subcutaneous Q24H  . ferrous sulfate  325 mg Oral BID WC  . folic acid  1 mg Oral Daily  . guaiFENesin  1,200 mg Oral BID  . insulin aspart  0-15 Units Subcutaneous TID WC  . insulin aspart  0-5 Units Subcutaneous QHS  . ipratropium  0.5 mg Nebulization Q6H  . levalbuterol  0.63 mg Nebulization Q6H  . methotrexate  7.5 mg Oral Once per day on Tue Wed  . methylPREDNISolone (SOLU-MEDROL) injection  60 mg Intravenous Q12H  . metoprolol tartrate  25 mg Oral BID  . potassium chloride  40 mEq Oral Once  . sodium chloride  3 mL Intravenous Q12H    Continuous Infusions:   Past Medical History  Diagnosis Date  . Diabetes mellitus     x 20 yrs  . Lupus     sle  . Fibromyalgia   . RA (rheumatoid arthritis)     ra  . Thrombocytopenia, unspecified 09/04/2013    Past Surgical History  Procedure Laterality Date  . Appendectomy    . Cesarean section  x3  . Oophorectomy    . Cataract extraction w/  intraocular lens implant  2010    right eye  . Abdominal hysterectomy    . Left arm surgery      multiple surgeries on  . Trigger finger release  yrs ago    right hand  . Knee arthroscopy  yrs ago    left knee  . Shoulder hemi-arthroplasty  01/27/2011    Procedure: SHOULDER HEMI-ARTHROPLASTY;  Surgeon: Nita Sells, MD;  Location: [facility];  Service: Orthopedics;  Laterality: Right;  interscaline right shoulder   Colman Cater MS,RD,CSG,LDN Office: [phone] Pager: [phone]

## 2013-09-04 NOTE — Progress Notes (Addendum)
PROGRESS NOTE  [name] QDI:[id number] DOB: Dec 22, 1940 DOA: 09/03/2013 PCP: [name]., MD  Summary: 73 year old woman with history of tobacco dependence, 2 ppd, but no formal diagnosis of COPD who presented to the emergency department with several day history of shortness of breath, sinus tenderness. Treated with BiPAP in the emergency department.  Assessment/Plan: 1. Acute hypoxic respiratory failure secondary to acute bronchitis, suspected COPD exacerbation. Doubt heart failure. Overall improved today, stable off BiPAP. 2. COPD with possible exacerbation. 3. Possible acute sinusitis. Continue empiric antibiotics. 4. Tobacco dependence. Recommend cessation.  5. DM. Capillary blood sugars stable. 6. Lupus, RA on methotrexate 7. Thrombocytopenia, seen 12/17/2012, 01/29/2011. 8. Chronic normcytic anemia, likely at baseline. 9. Spiculated 7 mm spiculated appearing lesion in the right upper lobe which is worrisome for malignancy. This could be inflammatory. Follow-up CT scan in 3 months is recommended per radiology   Overall appears improved; off BiPAP. Continue Rocephin, Zithromax, breathing treatments; plan transfer to medical floor.  Wean oxygen as tolerated.  Followup anemia and thrombocytopenia as an outpatient  Possibly home next 48 hours  Code Status: DNR DVT prophylaxis: Lovenox Family Communication: none present Disposition Plan: home  Murray Hodgkins, MD  [facility]  Pager [phone] If 7PM-7AM, please contact night-coverage at [email or web address], password [facility] 09/04/2013, 9:27 AM  LOS: 1 day   Consultants:    Procedures:    Antibiotics:  Azithromycin 8/18 >>   Ceftriaxone 8/18 >>   HPI/Subjective: Feels better today. Breathing better.  Objective: Filed Vitals:   09/04/13 0700 09/04/13 0730 09/04/13 0752 09/04/13 0800  BP: 116/66 125/43  115/41  Pulse: 103 88  84  Temp: 98.1 F (36.7 C)     TempSrc: Oral     Resp: 22 17  17   Height:       Weight:      SpO2: 99% 99% 99% 99%    Intake/Output Summary (Last 24 hours) at 09/04/13 0927 Last data filed at 09/04/13 0700  Gross per 24 hour  Intake    543 ml  Output   1150 ml  Net   -607 ml     Filed Weights   09/03/13 1105 09/04/13 0500  Weight: 68.947 kg (152 lb) 69.6 kg (153 lb 7 oz)    Exam:     Afebrile, vital signs stable. Hypoxia stable on 2 L. Gen. Appears calm and comfortable. Nontoxic.  Psych. Alert. Speech fluent and clear.  Eyes. Pupils round, equal  ENT. Lips and tongue appear unremarkable. Somewhat hard of hearing.  Cardiovascular. Regular rate and rhythm. No murmur, rub or gallop. No lower extremity edema. Telemetry sinus rhythm.  Respiratory. Fair movement. No wheezes, rales or rhonchi. Normal respiratory effort.  Skin. Limited exam appears grossly unremarkable.  Data Reviewed:  Chemistry: Basic metabolic panel unremarkable. Potassium 3.6. Troponins negative.  Heme: Thrombocytopenia stable. Hemoglobin has decreased today, suspect dilution.  Imaging: CT chest, no pulmonary emboli. Severe bronchitis left lower lobe. Lesser degree lingula and both lung apices. COPD.  Scheduled Meds: . aspirin EC  81 mg Oral Daily  . azithromycin  500 mg Intravenous Q24H  . azithromycin  500 mg Intravenous Q24H  . cefTRIAXone (ROCEPHIN)  IV  1 g Intravenous Q24H  . enoxaparin (LOVENOX) injection  40 mg Subcutaneous Q24H  . ferrous sulfate  325 mg Oral BID WC  . folic acid  1 mg Oral Daily  . guaiFENesin  1,200 mg Oral BID  . insulin aspart  0-15 Units Subcutaneous TID WC  . insulin aspart  0-5 Units Subcutaneous QHS  . ipratropium  0.5 mg Nebulization Q6H  . levalbuterol  0.63 mg Nebulization Q6H  . methotrexate  7.5 mg Oral Once per day on Tue Wed  . methylPREDNISolone (SOLU-MEDROL) injection  60 mg Intravenous Q6H  . metoprolol tartrate  25 mg Oral BID  . sodium chloride  3 mL Intravenous Q12H   Continuous Infusions:   Principal Problem:    Acute respiratory failure with hypoxia Active Problems:   Rheumatoid arthritis(714.0)   COPD exacerbation   Type II or unspecified type diabetes mellitus with unspecified complication, uncontrolled   Essential hypertension   Tobacco dependence   Sinusitis, acute maxillary   Acute bronchitis   Thrombocytopenia, unspecified   Normocytic anemia   Time spent 20 minutes

## 2013-09-05 DIAGNOSIS — D696 Thrombocytopenia, unspecified: Secondary | ICD-10-CM

## 2013-09-05 LAB — CBC
HCT: 34.3 % — ABNORMAL LOW (ref 36.0–46.0)
Hemoglobin: 11 g/dL — ABNORMAL LOW (ref 12.0–15.0)
MCH: 31.6 pg (ref 26.0–34.0)
MCHC: 32.1 g/dL (ref 30.0–36.0)
MCV: 98.6 fL (ref 78.0–100.0)
PLATELETS: 103 10*3/uL — AB (ref 150–400)
RBC: 3.48 MIL/uL — AB (ref 3.87–5.11)
RDW: 19.1 % — ABNORMAL HIGH (ref 11.5–15.5)
WBC: 4.6 10*3/uL (ref 4.0–10.5)

## 2013-09-05 LAB — GLUCOSE, CAPILLARY
Glucose-Capillary: 187 mg/dL — ABNORMAL HIGH (ref 70–99)
Glucose-Capillary: 259 mg/dL — ABNORMAL HIGH (ref 70–99)
Glucose-Capillary: 142 mg/dL — ABNORMAL HIGH (ref 70–99)
Glucose-Capillary: 293 mg/dL — ABNORMAL HIGH (ref 70–99)

## 2013-09-05 LAB — BASIC METABOLIC PANEL
Anion gap: 12 (ref 5–15)
BUN: 33 mg/dL — ABNORMAL HIGH (ref 6–23)
CHLORIDE: 98 meq/L (ref 96–112)
CO2: 26 meq/L (ref 19–32)
Calcium: 9.1 mg/dL (ref 8.4–10.5)
Creatinine, Ser: 0.91 mg/dL (ref 0.50–1.10)
GFR calc Af Amer: 71 mL/min — ABNORMAL LOW (ref >90)
GFR calc non Af Amer: 62 mL/min — ABNORMAL LOW (ref >90)
Glucose, Bld: 151 mg/dL — ABNORMAL HIGH (ref 70–99)
POTASSIUM: 5.1 meq/L (ref 3.7–5.3)
Sodium: 136 mEq/L — ABNORMAL LOW (ref 137–147)

## 2013-09-05 MED ORDER — GUAIFENESIN 100 MG/5ML PO SOLN
200.0000 mg | ORAL | Status: DC | PRN
  Administered 2013-09-05 – 2013-09-06 (×4): 200 mg via ORAL
  Filled 2013-09-05 (×4): qty 10

## 2013-09-05 MED ORDER — AZITHROMYCIN 250 MG PO TABS
500.0000 mg | ORAL_TABLET | Freq: Every day | ORAL | Status: DC
  Administered 2013-09-05 – 2013-09-06 (×2): 500 mg via ORAL
  Filled 2013-09-05 (×2): qty 2

## 2013-09-05 NOTE — Progress Notes (Signed)
Admin 125 mg of Solu-Medrol accidentally instead of 60 mg. M.D. Notified. Will continue to monitor patient.  Arnell Sieving, RN

## 2013-09-05 NOTE — Progress Notes (Signed)
PHARMACIST - PHYSICIAN COMMUNICATION DR:   Sarajane Jews CONCERNING: Antibiotic IV to Oral Route Change Policy  RECOMMENDATION: This patient is receiving Zithromax by the intravenous route.  Based on criteria approved by the Pharmacy and Therapeutics Committee, the antibiotic(s) is/are being converted to the equivalent oral dose form(s).  DESCRIPTION: These criteria include:  Patient being treated for a respiratory tract infection, urinary tract infection, cellulitis or clostridium difficile associated diarrhea if on metronidazole  The patient is not neutropenic and does not exhibit a GI malabsorption state  The patient is eating (either orally or via tube) and/or has been taking other orally administered medications for a least 24 hours  The patient is improving clinically and has a Tmax < 100.5  If you have questions about this conversion, please contact the Pharmacy Department  [x]   [phone] )  [facility] []   [phone] )  [facility]  []   [phone] )  [facility] []   [phone] )  [facility]    [address]. [place] Crane, PharmD

## 2013-09-05 NOTE — Progress Notes (Signed)
PROGRESS NOTE  [name]:[id number] DOB: Mar 29, 1940 DOA: 09/03/2013 PCP: [name]., MD  Summary: 73 year old woman with history of tobacco dependence, 2 ppd, but no formal diagnosis of COPD who presented to the emergency department with several day history of shortness of breath, sinus tenderness. Treated with BiPAP in the emergency department.  Assessment/Plan: 1. Acute hypoxic respiratory failure secondary to acute bronchitis, suspected COPD exacerbation. No further improvement yet. No evidence of acute heart failure.  2. COPD with possible exacerbation. About the same today. 3. Possible acute sinusitis. Continue empiric antibiotics. 4. Tobacco dependence. Recommend cessation.  5. DM. Capillary blood sugars remain stable. 6. Lupus, RA on methotrexate 7. Thrombocytopenia, seen 12/17/2012, 01/29/2011. Stable. Followup as an outpatient. 8. Chronic normcytic anemia, appears stable. Followup as an outpatient. 9. Spiculated 7 mm spiculated appearing lesion in the right upper lobe which is worrisome for malignancy. This could be inflammatory. Follow-up CT scan in 3 months is recommended per radiology   Appears about the same or slightly worse. Refusing BiPAP.  Plan to continue empiric antibiotics, steroids, breathing treatments. Possibly home in the next 48 hours.  Code Status: DNR DVT prophylaxis: Lovenox Family Communication: none present Disposition Plan: home  Murray Hodgkins, MD  [facility]  Pager [phone] If 7PM-7AM, please contact night-coverage at [email or web address], password [facility] 09/05/2013, 11:11 AM  LOS: 2 days   Consultants:    Procedures:  2-D echocardiogram: Left ventricular ejection fraction 60-65%. Grade 1 diastolic dysfunction.  Antibiotics:  Azithromycin 8/18 >>   Ceftriaxone 8/18 >>   HPI/Subjective: Feels about the same or a little bit worse. Short of breath with ambulation. Coughing. Appetite has been good, eating  well.  Objective: Filed Vitals:   09/04/13 2131 09/05/13 0500 09/05/13 0556 09/05/13 0638  BP: 105/38  133/37   Pulse: 92  96   Temp: 98.4 F (36.9 C)  97.7 F (36.5 C)   TempSrc: Oral  Oral   Resp: 20  20   Height:      Weight:  67.268 kg (148 lb 4.8 oz)    SpO2: 98%  95% 97%    Intake/Output Summary (Last 24 hours) at 09/05/13 1111 Last data filed at 09/05/13 0905  Gross per 24 hour  Intake    540 ml  Output   2300 ml  Net  -1760 ml     Filed Weights   09/03/13 1105 09/04/13 0500 09/05/13 0500  Weight: 68.947 kg (152 lb) 69.6 kg (153 lb 7 oz) 67.268 kg (148 lb 4.8 oz)    Exam:     Remains afebrile. Vitals stable. Minimal hypoxia.  Gen. Appears calm and comfortable, nontoxic but a little bit worse than yesterday. Speech fluent and clear. Grossly normal mentation.  Cardiovascular regular rate and rhythm. No murmur, rub or gallop.  Respiratory. Poor air movement. Mild increased respiratory effort. Able to speak in full sentences. Some scattered rhonchi. No frank wheezes or rales.  Data Reviewed:  Basic metabolic panel is unremarkable.  CBC stable with mild anemia and thrombocytopenia stable  Scheduled Meds: . aspirin EC  81 mg Oral Daily  . azithromycin  500 mg Intravenous Q24H  . cefTRIAXone (ROCEPHIN)  IV  1 g Intravenous Q24H  . enoxaparin (LOVENOX) injection  40 mg Subcutaneous Q24H  . feeding supplement (ENSURE COMPLETE)  237 mL Oral BID BM  . ferrous sulfate  325 mg Oral BID WC  . folic acid  1 mg Oral Daily  . insulin aspart  0-15 Units Subcutaneous TID WC  .  insulin aspart  0-5 Units Subcutaneous QHS  . ipratropium  0.5 mg Nebulization Q6H  . levalbuterol  0.63 mg Nebulization Q6H  . methotrexate  7.5 mg Oral Once per day on Tue Wed  . methylPREDNISolone (SOLU-MEDROL) injection  60 mg Intravenous Q12H  . metoprolol tartrate  25 mg Oral BID  . sodium chloride  3 mL Intravenous Q12H   Continuous Infusions:   Principal Problem:   Acute  respiratory failure with hypoxia Active Problems:   Rheumatoid arthritis(714.0)   COPD exacerbation   Type II or unspecified type diabetes mellitus with unspecified complication, uncontrolled   Essential hypertension   Tobacco dependence   Sinusitis, acute maxillary   Acute bronchitis   Thrombocytopenia, unspecified   Normocytic anemia   Time spent 15 minutes

## 2013-09-06 DIAGNOSIS — F17200 Nicotine dependence, unspecified, uncomplicated: Secondary | ICD-10-CM

## 2013-09-06 LAB — GLUCOSE, CAPILLARY
GLUCOSE-CAPILLARY: 157 mg/dL — AB (ref 70–99)
Glucose-Capillary: 377 mg/dL — ABNORMAL HIGH (ref 70–99)

## 2013-09-06 MED ORDER — PREDNISONE 20 MG PO TABS: 40.0000 mg | ORAL_TABLET | Freq: Every day | ORAL | Status: DC

## 2013-09-06 MED ORDER — AZITHROMYCIN 500 MG PO TABS: 500.0000 mg | ORAL_TABLET | Freq: Every day | ORAL | Status: DC

## 2013-09-06 MED ORDER — PREDNISONE 10 MG PO TABS: ORAL_TABLET | ORAL | Status: DC

## 2013-09-06 MED ORDER — CEFUROXIME AXETIL 250 MG PO TABS: 500.0000 mg | ORAL_TABLET | Freq: Two times a day (BID) | ORAL | Status: DC

## 2013-09-06 MED ORDER — CEFUROXIME AXETIL 500 MG PO TABS: 500.0000 mg | ORAL_TABLET | Freq: Two times a day (BID) | ORAL | Status: DC

## 2013-09-06 MED ORDER — ALBUTEROL SULFATE HFA 108 (90 BASE) MCG/ACT IN AERS: 2.0000 | INHALATION_SPRAY | Freq: Four times a day (QID) | RESPIRATORY_TRACT | Status: AC | PRN

## 2013-09-06 NOTE — Progress Notes (Signed)
Pt's O2 saturation on room air 92 % at rest.  Pt ambulated in hallway with standby assistance. Pt's O2 saturation while ambulation on room air 94 %. Tolerated well.

## 2013-09-06 NOTE — Care Management Note (Signed)
    Page 1 of 1   09/06/2013     9:46:36 AM CARE MANAGEMENT NOTE 09/06/2013  Patient:  [name], [name]   Account Number:  [id number]  Date Initiated:  09/04/2013  Documentation initiated by:  Theophilus Kinds  Subjective/Objective Assessment:   Pt admitted from home with respiratory failure. Pt lives with her son and will return home with son at discharge. Pt is fairly independent with ADL's but son does provide some assitance with bathing. Pt has neb machine, BSC, cane, and bath     Action/Plan:   seat for home use. Will assess need for home O2 prior to discharge.   Anticipated DC Date:  09/06/2013   Anticipated DC Plan:  [place]  CM consult      Choice offered to / List presented to:             Status of service:  Completed, signed off Medicare Important Message given?  YES (If response is "NO", the following Medicare IM given date fields will be blank) Date Medicare IM given:  09/06/2013 Medicare IM given by:  Theophilus Kinds Date Additional Medicare IM given:   Additional Medicare IM given by:    Discharge Disposition:  HOME/SELF CARE  Per UR Regulation:    If discussed at Long Length of Stay Meetings, dates discussed:    Comments:  09/06/13 [place], RN BSN CM Pt potential discharge home today. Pt does not qualify for home O2. No other CM needs noted.  09/04/13 [place], RN BSN CM

## 2013-09-06 NOTE — Progress Notes (Signed)
PROGRESS NOTE  [name] YBW:[id number] DOB: Oct 24, 1940 DOA: 09/03/2013 PCP: [name]., MD  Summary: 73 year old woman with history of tobacco dependence, 2 ppd, but no formal diagnosis of COPD who presented to the emergency department with several day history of shortness of breath, sinus tenderness. Treated with BiPAP in the emergency department.  Assessment/Plan: 1. Acute hypoxic respiratory failure secondary to acute bronchitis, suspected COPD exacerbation. Continues to improve. Hypoxia has resolved. 2. COPD with possible exacerbation. Acute portion appears resolved. 3. Possible acute sinusitis. Continue empiric antibiotics. 4. Tobacco dependence. Recommend cessation.  5. DM. Capillary blood sugars stable. Expect to improve on oral anti-hyperglycemics and steroid taper. 6. Lupus, RA on methotrexate 7. Thrombocytopenia, seen 12/17/2012, 01/29/2011. Stable. Followup as an outpatient. 8. Chronic normcytic anemia, stable. Followup as an outpatient. 9. Spiculated 7 mm spiculated appearing lesion in the right upper lobe which is worrisome for malignancy. This could be inflammatory. Follow-up CT scan in 3 months is recommended per radiology   Much improved today, patient desires discharge home.  Discharge home on oral antibiotics, complete steroid taper.  FMLA filled out for son by request  Murray Hodgkins, MD  [facility]  Pager [phone] If 7PM-7AM, please contact night-coverage at [email or web address], password [facility] 09/06/2013, 1:46 PM  LOS: 3 days   Consultants:    Procedures:  2-D echocardiogram: Left ventricular ejection fraction 60-65%. Grade 1 diastolic dysfunction.  Antibiotics:  Azithromycin 8/18 >> 8/22  Ceftriaxone 8/18 >> 8/21  Ceftin 8/22 >> 8/25  HPI/Subjective: Feels better. Breathing better. Less cough. Wants to go home.  Objective: Filed Vitals:   09/06/13 0541 09/06/13 0715 09/06/13 0842 09/06/13 0843  BP: 134/61     Pulse: 97       Temp: 98 F (36.7 C)     TempSrc: Oral     Resp: 18     Height:      Weight:      SpO2: 95% 97% 92% 94%    Intake/Output Summary (Last 24 hours) at 09/06/13 1346 Last data filed at 09/06/13 1251  Gross per 24 hour  Intake    810 ml  Output   1800 ml  Net   -990 ml     Filed Weights   09/04/13 0500 09/05/13 0500 09/06/13 0500  Weight: 69.6 kg (153 lb 7 oz) 67.268 kg (148 lb 4.8 oz) 67.586 kg (149 lb)    Exam:     Afebrile, vital signs stable. Hypoxia has resolved.  Data Reviewed:  Capillary blood sugars somewhat labile.  Scheduled Meds: . aspirin EC  81 mg Oral Daily  . azithromycin  500 mg Oral Daily  . cefTRIAXone (ROCEPHIN)  IV  1 g Intravenous Q24H  . enoxaparin (LOVENOX) injection  40 mg Subcutaneous Q24H  . feeding supplement (ENSURE COMPLETE)  237 mL Oral BID BM  . ferrous sulfate  325 mg Oral BID WC  . folic acid  1 mg Oral Daily  . insulin aspart  0-15 Units Subcutaneous TID WC  . insulin aspart  0-5 Units Subcutaneous QHS  . ipratropium  0.5 mg Nebulization Q6H  . levalbuterol  0.63 mg Nebulization Q6H  . methotrexate  7.5 mg Oral Once per day on Tue Wed  . methylPREDNISolone (SOLU-MEDROL) injection  60 mg Intravenous Q12H  . metoprolol tartrate  25 mg Oral BID  . sodium chloride  3 mL Intravenous Q12H   Continuous Infusions:   Principal Problem:   Acute respiratory failure with hypoxia Active Problems:   Rheumatoid arthritis(714.0)  COPD exacerbation   Type II or unspecified type diabetes mellitus with unspecified complication, uncontrolled   Essential hypertension   Tobacco dependence   Sinusitis, acute maxillary   Acute bronchitis   Thrombocytopenia, unspecified   Normocytic anemia

## 2013-09-06 NOTE — Discharge Summary (Addendum)
Physician Discharge Summary  [name] MKL:[id number] DOB: May 27, 1940 DOA: 09/03/2013  PCP: [name]., MD  Admit date: 09/03/2013 Discharge date: 09/06/2013  Recommendations for Outpatient Follow-up:  1. Resolution of acute bronchitis, suspected COPD exacerbation 2. COPD. Started on albuterol inhaler as needed. Consider Spiriva or Combivent if clinically indicated. Consider outpatient PFTs. 3. Continue to encourage smoking cessation 4. Chronic thrombocytopenia of unclear significance 5. Chronic normocytic anemia 6. Spiculated 7 mm spiculated appearing lesion in the right upper lobe which is worrisome for malignancy. This could be inflammatory. Follow-up CT scan in 3 months is recommended per radiology   Follow-up Information   Follow up with [name]., MD. Schedule an appointment as soon as possible for a visit in 1 week.   Specialty:  Internal Medicine   Contact information:   [address] [place] [address] [phone]      Discharge Diagnoses:  1. Acute hypoxic respiratory failure secondary to acute bronchitis, suspected COPD exacerbation 2. COPD with possible exacerbation 3. Acute bronchitis 4. Possible acute sinusitis 5. Tobacco dependence 6. Diabetes mellitus type 2 7. Chronic thrombocytopenia 8. Chronic normocytic anemia 9. Spiculated 7 mm spiculated appearing lesion in the right upper lobe    Discharge Condition: improved Disposition: home  Diet recommendation: diabetic diet  Filed Weights   09/04/13 0500 09/05/13 0500 09/06/13 0500  Weight: 69.6 kg (153 lb 7 oz) 67.268 kg (148 lb 4.8 oz) 67.586 kg (149 lb)    History of present illness:  73 year old woman with history of tobacco dependence, 2 ppd, but no formal diagnosis of COPD who presented to the emergency department with several day history of shortness of breath, sinus tenderness. Treated with BiPAP in the emergency department.  Hospital Course:  [name] improved  with treatment for acute bronchitis with empiric antibiotics, steroids, bronchodilators and oxygen. Hypoxic respiratory failure gradually resolved. Suspect COPD exacerbation as well which is clinically improved. Hospitalization uncomplicated and now stable for discharge. See individually she is below.  1. Acute hypoxic respiratory failure secondary to acute bronchitis, suspected COPD exacerbation. Continues to improve. Hypoxia has resolved. 2. COPD with possible exacerbation. Acute portion appears resolved. 3. Possible acute sinusitis. Continue empiric antibiotics. 4. Tobacco dependence. Recommend cessation.  5. DM. Capillary blood sugars stable. Expect to improve on oral anti-hyperglycemics and steroid taper. 6. Lupus, RA on methotrexate 7. Thrombocytopenia, seen 12/17/2012, 01/29/2011. Stable. Followup as an outpatient. 8. Chronic normcytic anemia, stable. Followup as an outpatient. 9. Spiculated 7 mm spiculated appearing lesion in the right upper lobe which is worrisome for malignancy. This could be inflammatory. Follow-up CT scan in 3 months is recommended per radiology  FMLA completed for son by patient's request  Consultants: none Procedures:  2-D echocardiogram: Left ventricular ejection fraction 60-65%. Grade 1 diastolic dysfunction. Antibiotics:  Azithromycin 8/18 >> 8/22  Ceftriaxone 8/18 >> 8/21  Ceftin 8/22 >> 8/25  Discharge Instructions  Discharge Instructions   Diet Carb Modified    Complete by:  As directed      Discharge instructions    Complete by:  As directed   Call physician or seek immediate medical attention for shortness of breath, increased wheezing or worsening of condition. Monitor your blood sugars closely while on steroids. Call for blood sugars greater than 400 or less than 70.     Increase activity slowly    Complete by:  As directed             Medication List  albuterol 108 (90 BASE) MCG/ACT inhaler  Commonly known as:  PROVENTIL  HFA;VENTOLIN HFA  Inhale 2 puffs into the lungs every 6 (six) hours as needed for wheezing or shortness of breath. Please instruct in usage.     alendronate 70 MG tablet  Commonly known as:  FOSAMAX  Take 1 tablet by mouth Once a week. On Fridays     azithromycin 500 MG tablet  Commonly known as:  ZITHROMAX  Take 1 tablet (500 mg total) by mouth daily. Take last dose 8/22.     cefUROXime 500 MG tablet  Commonly known as:  CEFTIN  Take 1 tablet (500 mg total) by mouth 2 (two) times daily with a meal. Start 8/22 in the morning.  Start taking on:  09/07/2013     cholecalciferol 1000 UNITS tablet  Commonly known as:  VITAMIN D  Take 1,000 Units by mouth 2 (two) times daily.     ferrous sulfate 325 (65 FE) MG tablet  Take 325 mg by mouth 2 (two) times daily with a meal.     folic acid 1 MG tablet  Commonly known as:  FOLVITE  Take 1 mg by mouth daily.     glyBURIDE 5 MG tablet  Commonly known as:  DIABETA  Take 5 mg by mouth 2 (two) times daily with a meal.     metFORMIN 500 MG tablet  Commonly known as:  GLUCOPHAGE  Take 500 mg by mouth 2 (two) times daily with a meal.     methotrexate 2.5 MG tablet  Take 7.5 mg by mouth 2 (two) times a week.     metoprolol tartrate 25 MG tablet  Commonly known as:  LOPRESSOR  Take 25 mg by mouth 2 (two) times daily.     predniSONE 10 MG tablet  Commonly known as:  DELTASONE  Start 8/22 . Take 40 mg by mouth daily for 3 days, then take 20 mg by mouth daily for 3 days, then take 10 mg by mouth daily for 3 days, then stop.       No Known Allergies  The results of significant diagnostics from this hospitalization (including imaging, microbiology, ancillary and laboratory) are listed below for reference.    Significant Diagnostic Studies: Ct Angio Chest Pe W/cm &/or Wo Cm  09/03/2013   CLINICAL DATA:  Shortness of breath and wheezing. Hypoxia. Cough and chest congestion with dyspnea on exertion. Tachycardia.  EXAM: CT ANGIOGRAPHY CHEST  WITH CONTRAST  TECHNIQUE: Multidetector CT imaging of the chest was performed using the standard protocol during bolus administration of intravenous contrast. Multiplanar CT image reconstructions and MIPs were obtained to evaluate the vascular anatomy.  CONTRAST:  14mL OMNIPAQUE IOHEXOL 350 MG/ML SOLN  COMPARISON:  Chest x-ray dated 09/03/2013  FINDINGS: There are no pulmonary emboli. The patient has severe peribronchial thickening particularly in the left lower lobe but also in the lingula and to a slightly lesser degree in both upper lobes. There is also a 7 mm spiculated lesion in the right upper lobe on images 29 and 30 of series 5. There is a 4 mm density in the anterior aspect of the left upper lobe on image 33 of series 5 which I suspect represents a mucous plug in a bronchus. There is no hilar or mediastinal adenopathy. The lungs are hyperinflated.  Heart size is normal. Moderate coronary artery calcification. Extensive atheromatous irregularity and calcification in the arch of the aorta. The visualized portion of the upper abdomen is normal.  There  is a benign appearing compression fracture of the inferior endplate of G31 which is new since a CT scan abdomen dated 06/14/2011.  Review of the MIP images confirms the above findings.  IMPRESSION: 1. No pulmonary emboli. 2. Severe bronchitis in the left lower lobe and to a lesser degree in the lingula and at both lung apices. 3. COPD. 4. Spiculated 7 mm spiculated appearing lesion in the right upper lobe which is worrisome for malignancy. This could be inflammatory. Follow-up CT scan in 3 months is recommended.   Electronically Signed   By: Rozetta Nunnery M.D.   On: 09/03/2013 20:59   Dg Chest Portable 1 View  09/03/2013   CLINICAL DATA:  Cough and shortness of breath.  EXAM: PORTABLE CHEST - 1 VIEW  COMPARISON:  01/13/2011  FINDINGS: Single view of the chest demonstrates prominent interstitial lung markings and difficult to exclude mild edema. Subtle density  at the left lung base is nonspecific. Heart size is normal. Right shoulder arthroplasty. Trachea is midline.  IMPRESSION: Prominent interstitial lung markings and question mild interstitial edema.  Vague density at the left lung base is nonspecific. This could represent overlying shadows or atelectasis. Consider a follow-up chest radiograph with attention to this area.   Electronically Signed   By: Markus Daft M.D.   On: 09/03/2013 11:50    Microbiology: Recent Results (from the past 240 hour(s))  CULTURE, BLOOD (ROUTINE X 2)     Status: None   Collection Time    09/03/13 12:54 PM      Result Value Ref Range Status   Specimen Description BLOOD RIGHT ANTECUBITAL   Final   Special Requests     Final   Value: BOTTLES DRAWN AEROBIC AND ANAEROBIC AEB=8CC ANA=10CC   Culture NO GROWTH 3 DAYS   Final   Report Status PENDING   Incomplete  CULTURE, BLOOD (ROUTINE X 2)     Status: None   Collection Time    09/03/13 12:59 PM      Result Value Ref Range Status   Specimen Description BLOOD LEFT ANTECUBITAL   Final   Special Requests BOTTLES DRAWN AEROBIC AND ANAEROBIC 8CC   Final   Culture NO GROWTH 3 DAYS   Final   Report Status PENDING   Incomplete  MRSA PCR SCREENING     Status: None   Collection Time    09/03/13  3:45 PM      Result Value Ref Range Status   MRSA by PCR NEGATIVE  NEGATIVE Final   Comment:            The GeneXpert MRSA Assay (FDA     approved for NASAL specimens     only), is one component of a     comprehensive MRSA colonization     surveillance program. It is not     intended to diagnose MRSA     infection nor to guide or     monitor treatment for     MRSA infections.     Labs: Basic Metabolic Panel:  Recent Labs Lab 09/03/13 1115 09/04/13 0447 09/05/13 0525  NA 132* 132* 136*  K 4.2 3.6* 5.1  CL 92* 91* 98  CO2 25 25 26   GLUCOSE 167* 181* 151*  BUN 17 25* 33*  CREATININE 0.87 1.01 0.91  CALCIUM 9.8 9.1 9.1   CBC:  Recent Labs Lab 09/03/13 1115  09/04/13 0447 09/05/13 0525  WBC 6.8 3.8* 4.6  NEUTROABS 3.2  --   --  HGB 12.8 10.4* 11.0*  HCT 38.8 31.0* 34.3*  MCV 96.8 95.4 98.6  PLT 103* 98* 103*   Cardiac Enzymes:  Recent Labs Lab 09/03/13 1115 09/03/13 1735 09/03/13 2318 09/04/13 0447  TROPONINI <0.30 <0.30 <0.30 <0.30     Recent Labs  09/03/13 1115  PROBNP 743.3*   CBG:  Recent Labs Lab 09/05/13 1200 09/05/13 1723 09/05/13 2043 09/06/13 0731 09/06/13 1112  GLUCAP 259* 142* 293* 157* 377*    Principal Problem:   Acute respiratory failure with hypoxia Active Problems:   Rheumatoid arthritis(714.0)   COPD exacerbation   Type II or unspecified type diabetes mellitus with unspecified complication, uncontrolled   Essential hypertension   Tobacco dependence   Sinusitis, acute maxillary   Acute bronchitis   Thrombocytopenia, unspecified   Normocytic anemia   Time coordinating discharge: 35 minutes  Signed:  Murray Hodgkins, MD [facility] 09/06/2013, 1:57 PM

## 2013-09-06 NOTE — Progress Notes (Signed)
Pt discharged home today per Dr. Sarajane Jews. Pt's IV site D/C'd and WDL. Pt's VSS. Pt provided with home medication list, discharge instructions, and prescriptions. Pt's son also provided with FMLA papers per request. Verbalized understanding of above. Pt left floor via WC in stable condition accompanied by NT.

## 2013-09-06 NOTE — Progress Notes (Signed)
Inpatient Diabetes Program Recommendations  AACE/ADA: New Consensus Statement on Inpatient Glycemic Control (2013)  Target Ranges:  Prepandial:   less than 140 mg/dL      Peak postprandial:   less than 180 mg/dL (1-2 hours)      Critically ill patients:  140 - 180 mg/dL   Results for [name], [name] (MRN [id number]) as of 09/06/2013 07:19  Ref. Range 09/05/2013 07:52 09/05/2013 12:00 09/05/2013 17:23 09/05/2013 20:43  Glucose-Capillary Latest Range: 70-99 mg/dL 187 (H) 259 (H) 142 (H) 293 (H)    Diabetes history: DM2 Outpatient Diabetes medications: Diabeta 5 mg BID, Metformin 500 BID Current orders for Inpatient glycemic control: Novolog 0-15 units AC, Novolog 0-5 units HS  Inpatient Diabetes Program Recommendations Insulin - Meal Coverage: Noted postprandial glucose consistently elevated. While inpatient and ordered steroids, please consider ordering Novolog 4 units TID with meals for meal coverage.  Thanks, Barnie Alderman, RN, MSN, CCRN Diabetes Coordinator Inpatient Diabetes Program [phone] (Team Pager) [phone] (AP office) [phone] [facility] office)

## 2013-09-08 LAB — CULTURE, BLOOD (ROUTINE X 2)
Culture: NO GROWTH
Culture: NO GROWTH

## 2013-09-12 DIAGNOSIS — J984 Other disorders of lung: Secondary | ICD-10-CM | POA: Diagnosis not present

## 2013-09-12 DIAGNOSIS — D696 Thrombocytopenia, unspecified: Secondary | ICD-10-CM | POA: Diagnosis not present

## 2013-09-12 DIAGNOSIS — M329 Systemic lupus erythematosus, unspecified: Secondary | ICD-10-CM | POA: Diagnosis not present

## 2013-09-12 DIAGNOSIS — J449 Chronic obstructive pulmonary disease, unspecified: Secondary | ICD-10-CM | POA: Diagnosis not present

## 2013-09-12 DIAGNOSIS — Z6825 Body mass index (BMI) 25.0-25.9, adult: Secondary | ICD-10-CM | POA: Diagnosis not present

## 2013-09-12 DIAGNOSIS — E1149 Type 2 diabetes mellitus with other diabetic neurological complication: Secondary | ICD-10-CM | POA: Diagnosis not present

## 2013-09-12 DIAGNOSIS — I10 Essential (primary) hypertension: Secondary | ICD-10-CM | POA: Diagnosis not present

## 2013-09-16 ENCOUNTER — Ambulatory Visit (INDEPENDENT_AMBULATORY_CARE_PROVIDER_SITE_OTHER): Payer: Medicare Other | Admitting: Ophthalmology

## 2013-10-15 DIAGNOSIS — S335XXA Sprain of ligaments of lumbar spine, initial encounter: Secondary | ICD-10-CM | POA: Diagnosis not present

## 2013-10-15 DIAGNOSIS — M999 Biomechanical lesion, unspecified: Secondary | ICD-10-CM | POA: Diagnosis not present

## 2013-10-24 DIAGNOSIS — Z79899 Other long term (current) drug therapy: Secondary | ICD-10-CM | POA: Diagnosis not present

## 2013-10-24 DIAGNOSIS — M069 Rheumatoid arthritis, unspecified: Secondary | ICD-10-CM | POA: Diagnosis not present

## 2013-11-12 DIAGNOSIS — M5137 Other intervertebral disc degeneration, lumbosacral region: Secondary | ICD-10-CM | POA: Diagnosis not present

## 2013-11-12 DIAGNOSIS — M9903 Segmental and somatic dysfunction of lumbar region: Secondary | ICD-10-CM | POA: Diagnosis not present

## 2013-11-19 ENCOUNTER — Other Ambulatory Visit (HOSPITAL_COMMUNITY): Payer: Self-pay | Admitting: Internal Medicine

## 2013-11-19 DIAGNOSIS — R918 Other nonspecific abnormal finding of lung field: Secondary | ICD-10-CM

## 2013-11-20 ENCOUNTER — Other Ambulatory Visit (HOSPITAL_COMMUNITY): Payer: Self-pay | Admitting: Internal Medicine

## 2013-11-20 DIAGNOSIS — R911 Solitary pulmonary nodule: Secondary | ICD-10-CM

## 2013-11-22 ENCOUNTER — Ambulatory Visit (HOSPITAL_COMMUNITY): Payer: Medicare Other

## 2013-11-28 ENCOUNTER — Ambulatory Visit (HOSPITAL_COMMUNITY)
Admission: RE | Admit: 2013-11-28 | Discharge: 2013-11-28 | Disposition: A | Discharge status: Home / Self Care | Payer: Medicare Other | Source: Ambulatory Visit | Attending: Internal Medicine | Admitting: Internal Medicine

## 2013-11-28 DIAGNOSIS — J439 Emphysema, unspecified: Secondary | ICD-10-CM | POA: Diagnosis not present

## 2013-11-28 DIAGNOSIS — Z09 Encounter for follow-up examination after completed treatment for conditions other than malignant neoplasm: Secondary | ICD-10-CM | POA: Insufficient documentation

## 2013-11-28 DIAGNOSIS — R911 Solitary pulmonary nodule: Secondary | ICD-10-CM | POA: Diagnosis not present

## 2013-11-28 DIAGNOSIS — J40 Bronchitis, not specified as acute or chronic: Secondary | ICD-10-CM | POA: Diagnosis not present

## 2013-11-29 DIAGNOSIS — Z6826 Body mass index (BMI) 26.0-26.9, adult: Secondary | ICD-10-CM | POA: Diagnosis not present

## 2013-11-29 DIAGNOSIS — J984 Other disorders of lung: Secondary | ICD-10-CM | POA: Diagnosis not present

## 2013-11-29 DIAGNOSIS — E663 Overweight: Secondary | ICD-10-CM | POA: Diagnosis not present

## 2013-12-02 DIAGNOSIS — R911 Solitary pulmonary nodule: Secondary | ICD-10-CM | POA: Diagnosis not present

## 2013-12-02 DIAGNOSIS — M059 Rheumatoid arthritis with rheumatoid factor, unspecified: Secondary | ICD-10-CM | POA: Diagnosis not present

## 2013-12-02 DIAGNOSIS — M25441 Effusion, right hand: Secondary | ICD-10-CM | POA: Diagnosis not present

## 2013-12-02 DIAGNOSIS — M545 Low back pain: Secondary | ICD-10-CM | POA: Diagnosis not present

## 2013-12-04 ENCOUNTER — Other Ambulatory Visit (HOSPITAL_COMMUNITY): Payer: Self-pay | Admitting: Respiratory Therapy

## 2013-12-04 ENCOUNTER — Other Ambulatory Visit (HOSPITAL_COMMUNITY): Payer: Self-pay | Admitting: Internal Medicine

## 2013-12-04 DIAGNOSIS — J984 Other disorders of lung: Secondary | ICD-10-CM

## 2013-12-04 DIAGNOSIS — J439 Emphysema, unspecified: Secondary | ICD-10-CM

## 2013-12-04 DIAGNOSIS — R911 Solitary pulmonary nodule: Secondary | ICD-10-CM

## 2013-12-05 ENCOUNTER — Ambulatory Visit (HOSPITAL_COMMUNITY)
Admission: RE | Admit: 2013-12-05 | Discharge: 2013-12-05 | Disposition: A | Discharge status: Home / Self Care | Payer: Medicare Other | Source: Ambulatory Visit | Attending: Internal Medicine | Admitting: Internal Medicine

## 2013-12-05 DIAGNOSIS — R911 Solitary pulmonary nodule: Secondary | ICD-10-CM | POA: Insufficient documentation

## 2013-12-05 MED ORDER — ALBUTEROL SULFATE (2.5 MG/3ML) 0.083% IN NEBU
2.5000 mg | INHALATION_SOLUTION | Freq: Once | RESPIRATORY_TRACT | Status: AC
  Administered 2013-12-05: 2.5 mg via RESPIRATORY_TRACT

## 2013-12-06 ENCOUNTER — Telehealth: Payer: Self-pay | Admitting: *Deleted

## 2013-12-09 ENCOUNTER — Encounter: Payer: Medicare Other | Admitting: Cardiothoracic Surgery

## 2013-12-10 DIAGNOSIS — M9903 Segmental and somatic dysfunction of lumbar region: Secondary | ICD-10-CM | POA: Diagnosis not present

## 2013-12-10 DIAGNOSIS — M5137 Other intervertebral disc degeneration, lumbosacral region: Secondary | ICD-10-CM | POA: Diagnosis not present

## 2013-12-17 LAB — PULMONARY FUNCTION TEST
DL/VA % PRED: 76 %
DL/VA: 3.62 ml/min/mmHg/L
DLCO cor % pred: 48 %
DLCO cor: 11.48 ml/min/mmHg
DLCO unc % pred: 48 %
DLCO unc: 11.48 ml/min/mmHg
FEF 25-75 Post: 0.56 L/sec
FEF 25-75 Pre: 0.75 L/sec
FEF2575-%CHANGE-POST: -25 %
FEF2575-%PRED-POST: 32 %
FEF2575-%Pred-Pre: 43 %
FEV1-%CHANGE-POST: -10 %
FEV1-%PRED-PRE: 66 %
FEV1-%Pred-Post: 59 %
FEV1-POST: 1.27 L
FEV1-Pre: 1.42 L
FEV1FVC-%Change-Post: -6 %
FEV1FVC-%PRED-PRE: 89 %
FEV6-%Change-Post: -4 %
FEV6-%Pred-Post: 73 %
FEV6-%Pred-Pre: 76 %
FEV6-PRE: 2.07 L
FEV6-Post: 1.98 L
FEV6FVC-%Change-Post: 0 %
FEV6FVC-%PRED-PRE: 103 %
FEV6FVC-%Pred-Post: 103 %
FVC-%Change-Post: -4 %
FVC-%Pred-Post: 71 %
FVC-%Pred-Pre: 74 %
FVC-Post: 2.02 L
FVC-Pre: 2.11 L
POST FEV1/FVC RATIO: 63 %
POST FEV6/FVC RATIO: 98 %
Pre FEV1/FVC ratio: 67 %
Pre FEV6/FVC Ratio: 98 %
RV % pred: 106 %
RV: 2.36 L
TLC % PRED: 81 %
TLC: 4.07 L

## 2014-01-07 DIAGNOSIS — M9903 Segmental and somatic dysfunction of lumbar region: Secondary | ICD-10-CM | POA: Diagnosis not present

## 2014-01-07 DIAGNOSIS — M5137 Other intervertebral disc degeneration, lumbosacral region: Secondary | ICD-10-CM | POA: Diagnosis not present

## 2014-01-23 ENCOUNTER — Other Ambulatory Visit (HOSPITAL_COMMUNITY): Payer: Self-pay | Admitting: "Endocrinology

## 2014-01-23 DIAGNOSIS — E049 Nontoxic goiter, unspecified: Secondary | ICD-10-CM

## 2014-01-28 DIAGNOSIS — M545 Low back pain: Secondary | ICD-10-CM | POA: Diagnosis not present

## 2014-01-28 DIAGNOSIS — R799 Abnormal finding of blood chemistry, unspecified: Secondary | ICD-10-CM | POA: Diagnosis not present

## 2014-01-28 DIAGNOSIS — E042 Nontoxic multinodular goiter: Secondary | ICD-10-CM | POA: Diagnosis not present

## 2014-01-28 DIAGNOSIS — M25441 Effusion, right hand: Secondary | ICD-10-CM | POA: Diagnosis not present

## 2014-01-28 DIAGNOSIS — M25442 Effusion, left hand: Secondary | ICD-10-CM | POA: Diagnosis not present

## 2014-01-30 ENCOUNTER — Ambulatory Visit (HOSPITAL_COMMUNITY): Admission: RE | Admit: 2014-01-30 | Payer: Medicare Other | Source: Ambulatory Visit

## 2014-02-03 DIAGNOSIS — E119 Type 2 diabetes mellitus without complications: Secondary | ICD-10-CM | POA: Diagnosis not present

## 2014-02-03 DIAGNOSIS — E042 Nontoxic multinodular goiter: Secondary | ICD-10-CM | POA: Diagnosis not present

## 2014-02-04 DIAGNOSIS — M5137 Other intervertebral disc degeneration, lumbosacral region: Secondary | ICD-10-CM | POA: Diagnosis not present

## 2014-02-04 DIAGNOSIS — M9903 Segmental and somatic dysfunction of lumbar region: Secondary | ICD-10-CM | POA: Diagnosis not present

## 2014-03-03 ENCOUNTER — Ambulatory Visit (HOSPITAL_COMMUNITY): Payer: Medicare Other

## 2014-03-06 ENCOUNTER — Ambulatory Visit (HOSPITAL_COMMUNITY)
Admission: RE | Admit: 2014-03-06 | Discharge: 2014-03-06 | Disposition: A | Discharge status: Home / Self Care | Payer: Medicare Other | Source: Ambulatory Visit | Attending: Internal Medicine | Admitting: Internal Medicine

## 2014-03-06 DIAGNOSIS — J439 Emphysema, unspecified: Secondary | ICD-10-CM

## 2014-03-06 DIAGNOSIS — J984 Other disorders of lung: Secondary | ICD-10-CM

## 2014-03-06 DIAGNOSIS — R918 Other nonspecific abnormal finding of lung field: Secondary | ICD-10-CM | POA: Diagnosis not present

## 2014-03-06 DIAGNOSIS — R911 Solitary pulmonary nodule: Secondary | ICD-10-CM | POA: Insufficient documentation

## 2014-03-06 DIAGNOSIS — Z720 Tobacco use: Secondary | ICD-10-CM | POA: Diagnosis not present

## 2014-03-19 ENCOUNTER — Other Ambulatory Visit (HOSPITAL_COMMUNITY): Payer: Self-pay | Admitting: Internal Medicine

## 2014-03-19 DIAGNOSIS — R918 Other nonspecific abnormal finding of lung field: Secondary | ICD-10-CM | POA: Diagnosis not present

## 2014-03-19 DIAGNOSIS — J438 Other emphysema: Secondary | ICD-10-CM | POA: Diagnosis not present

## 2014-03-19 DIAGNOSIS — Z720 Tobacco use: Secondary | ICD-10-CM | POA: Diagnosis not present

## 2014-03-19 DIAGNOSIS — Z716 Tobacco abuse counseling: Secondary | ICD-10-CM | POA: Diagnosis not present

## 2014-04-02 DIAGNOSIS — R911 Solitary pulmonary nodule: Secondary | ICD-10-CM | POA: Diagnosis not present

## 2014-04-02 DIAGNOSIS — M25532 Pain in left wrist: Secondary | ICD-10-CM | POA: Diagnosis not present

## 2014-04-02 DIAGNOSIS — M545 Low back pain: Secondary | ICD-10-CM | POA: Diagnosis not present

## 2014-04-02 DIAGNOSIS — Z79899 Other long term (current) drug therapy: Secondary | ICD-10-CM | POA: Diagnosis not present

## 2014-04-02 DIAGNOSIS — M25531 Pain in right wrist: Secondary | ICD-10-CM | POA: Diagnosis not present

## 2014-04-02 DIAGNOSIS — M059 Rheumatoid arthritis with rheumatoid factor, unspecified: Secondary | ICD-10-CM | POA: Diagnosis not present

## 2014-05-09 DIAGNOSIS — R911 Solitary pulmonary nodule: Secondary | ICD-10-CM | POA: Diagnosis not present

## 2014-05-09 DIAGNOSIS — M545 Low back pain: Secondary | ICD-10-CM | POA: Diagnosis not present

## 2014-05-09 DIAGNOSIS — M0570 Rheumatoid arthritis with rheumatoid factor of unspecified site without organ or systems involvement: Secondary | ICD-10-CM | POA: Diagnosis not present

## 2014-05-09 DIAGNOSIS — Z79899 Other long term (current) drug therapy: Secondary | ICD-10-CM | POA: Diagnosis not present

## 2014-06-10 DIAGNOSIS — Z23 Encounter for immunization: Secondary | ICD-10-CM | POA: Diagnosis not present

## 2014-06-10 DIAGNOSIS — Z6828 Body mass index (BMI) 28.0-28.9, adult: Secondary | ICD-10-CM | POA: Diagnosis not present

## 2014-06-10 DIAGNOSIS — J45909 Unspecified asthma, uncomplicated: Secondary | ICD-10-CM | POA: Diagnosis not present

## 2014-06-10 DIAGNOSIS — I10 Essential (primary) hypertension: Secondary | ICD-10-CM | POA: Diagnosis not present

## 2014-06-10 DIAGNOSIS — J449 Chronic obstructive pulmonary disease, unspecified: Secondary | ICD-10-CM | POA: Diagnosis not present

## 2014-06-10 DIAGNOSIS — J984 Other disorders of lung: Secondary | ICD-10-CM | POA: Diagnosis not present

## 2014-06-10 DIAGNOSIS — E663 Overweight: Secondary | ICD-10-CM | POA: Diagnosis not present

## 2014-06-10 DIAGNOSIS — E1140 Type 2 diabetes mellitus with diabetic neuropathy, unspecified: Secondary | ICD-10-CM | POA: Diagnosis not present

## 2014-06-17 DIAGNOSIS — Z23 Encounter for immunization: Secondary | ICD-10-CM | POA: Diagnosis not present

## 2014-06-17 DIAGNOSIS — Z6828 Body mass index (BMI) 28.0-28.9, adult: Secondary | ICD-10-CM | POA: Diagnosis not present

## 2014-06-17 DIAGNOSIS — E663 Overweight: Secondary | ICD-10-CM | POA: Diagnosis not present

## 2014-06-18 ENCOUNTER — Ambulatory Visit (HOSPITAL_COMMUNITY)
Admission: RE | Admit: 2014-06-18 | Discharge: 2014-06-18 | Disposition: A | Discharge status: Home / Self Care | Payer: Medicare Other | Source: Ambulatory Visit | Attending: Internal Medicine | Admitting: Internal Medicine

## 2014-06-18 DIAGNOSIS — R918 Other nonspecific abnormal finding of lung field: Secondary | ICD-10-CM | POA: Insufficient documentation

## 2014-06-18 DIAGNOSIS — I700 Atherosclerosis of aorta: Secondary | ICD-10-CM | POA: Insufficient documentation

## 2014-06-18 DIAGNOSIS — I2510 Atherosclerotic heart disease of native coronary artery without angina pectoris: Secondary | ICD-10-CM | POA: Insufficient documentation

## 2014-06-18 LAB — GLUCOSE, CAPILLARY: GLUCOSE-CAPILLARY: 120 mg/dL — AB (ref 65–99)

## 2014-06-18 MED ORDER — FLUDEOXYGLUCOSE F - 18 (FDG) INJECTION
8.0000 | Freq: Once | INTRAVENOUS | Status: AC | PRN
  Administered 2014-06-18: 8 via INTRAVENOUS

## 2014-06-25 DIAGNOSIS — J438 Other emphysema: Secondary | ICD-10-CM | POA: Diagnosis not present

## 2014-06-25 DIAGNOSIS — R918 Other nonspecific abnormal finding of lung field: Secondary | ICD-10-CM | POA: Diagnosis not present

## 2014-06-25 DIAGNOSIS — Z720 Tobacco use: Secondary | ICD-10-CM | POA: Diagnosis not present

## 2014-07-25 ENCOUNTER — Encounter: Payer: Self-pay | Admitting: *Deleted

## 2014-07-25 ENCOUNTER — Ambulatory Visit (HOSPITAL_COMMUNITY): Admission: RE | Admit: 2014-07-25 | Payer: Medicare Other | Source: Ambulatory Visit

## 2014-07-30 DIAGNOSIS — Z79899 Other long term (current) drug therapy: Secondary | ICD-10-CM | POA: Diagnosis not present

## 2014-07-30 DIAGNOSIS — M059 Rheumatoid arthritis with rheumatoid factor, unspecified: Secondary | ICD-10-CM | POA: Diagnosis not present

## 2014-07-30 DIAGNOSIS — M25442 Effusion, left hand: Secondary | ICD-10-CM | POA: Diagnosis not present

## 2014-07-30 DIAGNOSIS — M25441 Effusion, right hand: Secondary | ICD-10-CM | POA: Diagnosis not present

## 2014-08-15 ENCOUNTER — Encounter: Payer: Self-pay | Admitting: Cardiovascular Disease

## 2014-08-26 DIAGNOSIS — M059 Rheumatoid arthritis with rheumatoid factor, unspecified: Secondary | ICD-10-CM | POA: Diagnosis not present

## 2014-08-26 DIAGNOSIS — Z79899 Other long term (current) drug therapy: Secondary | ICD-10-CM | POA: Diagnosis not present

## 2014-09-24 DIAGNOSIS — I7090 Unspecified atherosclerosis: Secondary | ICD-10-CM | POA: Diagnosis not present

## 2014-09-24 DIAGNOSIS — R918 Other nonspecific abnormal finding of lung field: Secondary | ICD-10-CM | POA: Diagnosis not present

## 2014-09-24 DIAGNOSIS — J449 Chronic obstructive pulmonary disease, unspecified: Secondary | ICD-10-CM | POA: Diagnosis not present

## 2014-09-24 DIAGNOSIS — E119 Type 2 diabetes mellitus without complications: Secondary | ICD-10-CM | POA: Diagnosis not present

## 2014-09-24 DIAGNOSIS — F17200 Nicotine dependence, unspecified, uncomplicated: Secondary | ICD-10-CM | POA: Diagnosis not present

## 2014-09-24 DIAGNOSIS — Z85828 Personal history of other malignant neoplasm of skin: Secondary | ICD-10-CM | POA: Diagnosis not present

## 2014-09-25 DIAGNOSIS — J438 Other emphysema: Secondary | ICD-10-CM | POA: Diagnosis not present

## 2014-09-25 DIAGNOSIS — D691 Qualitative platelet defects: Secondary | ICD-10-CM | POA: Diagnosis not present

## 2014-09-25 DIAGNOSIS — Z716 Tobacco abuse counseling: Secondary | ICD-10-CM | POA: Diagnosis not present

## 2014-09-25 DIAGNOSIS — Z720 Tobacco use: Secondary | ICD-10-CM | POA: Diagnosis not present

## 2014-09-25 DIAGNOSIS — R918 Other nonspecific abnormal finding of lung field: Secondary | ICD-10-CM | POA: Diagnosis not present

## 2014-09-25 DIAGNOSIS — Z8739 Personal history of other diseases of the musculoskeletal system and connective tissue: Secondary | ICD-10-CM | POA: Diagnosis not present

## 2014-12-03 DIAGNOSIS — M0570 Rheumatoid arthritis with rheumatoid factor of unspecified site without organ or systems involvement: Secondary | ICD-10-CM | POA: Diagnosis not present

## 2014-12-03 DIAGNOSIS — M2550 Pain in unspecified joint: Secondary | ICD-10-CM | POA: Diagnosis not present

## 2014-12-03 DIAGNOSIS — R911 Solitary pulmonary nodule: Secondary | ICD-10-CM | POA: Diagnosis not present

## 2014-12-03 DIAGNOSIS — Z79899 Other long term (current) drug therapy: Secondary | ICD-10-CM | POA: Diagnosis not present

## 2014-12-03 DIAGNOSIS — D696 Thrombocytopenia, unspecified: Secondary | ICD-10-CM | POA: Diagnosis not present

## 2014-12-18 DIAGNOSIS — Z79899 Other long term (current) drug therapy: Secondary | ICD-10-CM | POA: Diagnosis not present

## 2014-12-18 DIAGNOSIS — M0570 Rheumatoid arthritis with rheumatoid factor of unspecified site without organ or systems involvement: Secondary | ICD-10-CM | POA: Diagnosis not present

## 2015-01-28 DIAGNOSIS — J438 Other emphysema: Secondary | ICD-10-CM | POA: Diagnosis not present

## 2015-01-28 DIAGNOSIS — I2510 Atherosclerotic heart disease of native coronary artery without angina pectoris: Secondary | ICD-10-CM | POA: Diagnosis not present

## 2015-01-28 DIAGNOSIS — R918 Other nonspecific abnormal finding of lung field: Secondary | ICD-10-CM | POA: Diagnosis not present

## 2015-01-28 DIAGNOSIS — R911 Solitary pulmonary nodule: Secondary | ICD-10-CM | POA: Diagnosis not present

## 2015-01-28 DIAGNOSIS — F17200 Nicotine dependence, unspecified, uncomplicated: Secondary | ICD-10-CM | POA: Diagnosis not present

## 2015-01-28 DIAGNOSIS — D691 Qualitative platelet defects: Secondary | ICD-10-CM | POA: Diagnosis not present

## 2015-01-28 DIAGNOSIS — C3490 Malignant neoplasm of unspecified part of unspecified bronchus or lung: Secondary | ICD-10-CM

## 2015-01-28 DIAGNOSIS — K760 Fatty (change of) liver, not elsewhere classified: Secondary | ICD-10-CM | POA: Diagnosis not present

## 2015-01-28 HISTORY — DX: Malignant neoplasm of unspecified part of unspecified bronchus or lung: C34.90

## 2015-01-29 DIAGNOSIS — R918 Other nonspecific abnormal finding of lung field: Secondary | ICD-10-CM | POA: Diagnosis not present

## 2015-01-29 DIAGNOSIS — Z720 Tobacco use: Secondary | ICD-10-CM | POA: Diagnosis not present

## 2015-02-05 DIAGNOSIS — Z79899 Other long term (current) drug therapy: Secondary | ICD-10-CM | POA: Diagnosis not present

## 2015-02-05 DIAGNOSIS — D696 Thrombocytopenia, unspecified: Secondary | ICD-10-CM | POA: Diagnosis not present

## 2015-02-05 DIAGNOSIS — M0570 Rheumatoid arthritis with rheumatoid factor of unspecified site without organ or systems involvement: Secondary | ICD-10-CM | POA: Diagnosis not present

## 2015-02-05 DIAGNOSIS — M25442 Effusion, left hand: Secondary | ICD-10-CM | POA: Diagnosis not present

## 2015-02-05 DIAGNOSIS — R911 Solitary pulmonary nodule: Secondary | ICD-10-CM | POA: Diagnosis not present

## 2015-02-05 DIAGNOSIS — M25441 Effusion, right hand: Secondary | ICD-10-CM | POA: Diagnosis not present

## 2015-02-12 ENCOUNTER — Encounter: Payer: Self-pay | Admitting: Radiation Oncology

## 2015-02-12 DIAGNOSIS — M797 Fibromyalgia: Secondary | ICD-10-CM | POA: Diagnosis not present

## 2015-02-12 DIAGNOSIS — Z79899 Other long term (current) drug therapy: Secondary | ICD-10-CM | POA: Diagnosis not present

## 2015-02-12 DIAGNOSIS — C3412 Malignant neoplasm of upper lobe, left bronchus or lung: Secondary | ICD-10-CM

## 2015-02-12 DIAGNOSIS — C3401 Malignant neoplasm of right main bronchus: Secondary | ICD-10-CM | POA: Insufficient documentation

## 2015-02-12 DIAGNOSIS — E119 Type 2 diabetes mellitus without complications: Secondary | ICD-10-CM | POA: Diagnosis not present

## 2015-02-12 DIAGNOSIS — C3410 Malignant neoplasm of upper lobe, unspecified bronchus or lung: Secondary | ICD-10-CM | POA: Diagnosis not present

## 2015-02-12 DIAGNOSIS — M069 Rheumatoid arthritis, unspecified: Secondary | ICD-10-CM | POA: Diagnosis not present

## 2015-02-12 DIAGNOSIS — J449 Chronic obstructive pulmonary disease, unspecified: Secondary | ICD-10-CM | POA: Diagnosis not present

## 2015-02-12 DIAGNOSIS — Z7952 Long term (current) use of systemic steroids: Secondary | ICD-10-CM | POA: Diagnosis not present

## 2015-02-12 DIAGNOSIS — C3411 Malignant neoplasm of upper lobe, right bronchus or lung: Secondary | ICD-10-CM | POA: Diagnosis not present

## 2015-02-12 DIAGNOSIS — F17200 Nicotine dependence, unspecified, uncomplicated: Secondary | ICD-10-CM | POA: Diagnosis not present

## 2015-02-20 NOTE — Progress Notes (Signed)
New Consult seen in [facility]  Bi/lateral Lung stage I upper lobes SOB, Severe COPD, Home Oxygen productive ciough  No Biopsy,  HAD CT CHEST=01/28/2015 =Left upper lobe mass nodule , Right upper lobe mass nodule  Long hx smoking cigarettes  2ppd x 50 years,trying to quit  Will have CT Simulation today at 1030am   Allergies:Lopid,welbutrin= intolerance, nausea

## 2015-02-23 ENCOUNTER — Ambulatory Visit
Admission: RE | Admit: 2015-02-23 | Discharge: 2015-02-23 | Disposition: A | Discharge status: Home / Self Care | Payer: Medicare Other | Source: Ambulatory Visit | Attending: Radiation Oncology | Admitting: Radiation Oncology

## 2015-02-23 DIAGNOSIS — Z510 Encounter for antineoplastic radiation therapy: Secondary | ICD-10-CM | POA: Diagnosis not present

## 2015-02-23 DIAGNOSIS — C3412 Malignant neoplasm of upper lobe, left bronchus or lung: Secondary | ICD-10-CM | POA: Insufficient documentation

## 2015-02-23 DIAGNOSIS — F17210 Nicotine dependence, cigarettes, uncomplicated: Secondary | ICD-10-CM | POA: Insufficient documentation

## 2015-02-23 DIAGNOSIS — C3411 Malignant neoplasm of upper lobe, right bronchus or lung: Secondary | ICD-10-CM

## 2015-02-23 HISTORY — DX: Malignant neoplasm of unspecified part of unspecified bronchus or lung: C34.90

## 2015-02-23 NOTE — Progress Notes (Signed)
New Consult seen in [facility]  Bi/lateral Lung stage I upper lobes SOB, Severe COPD, Home Oxygen productive ciough  No Biopsy,  HAD CT CHEST=01/28/2015 =Left upper lobe mass nodule , Right upper lobe mass nodule  Long hx smoking cigarettes  2ppd x 50 years,trying to quit  Will have CT Simulation today at 1030am   Allergies:Lopid,welbutrin= intolerance, nausea  c/o soreness generalized, coughing up phelgm whitish  Color ,  Mostly in mornings,    Wt Readings from Last 3 Encounters:  09/06/13 149 lb (67.586 kg)  12/17/12 160 lb (72.576 kg)  10/19/12 165 lb (74.844 kg)   There were no vitals taken for this visit.

## 2015-02-26 ENCOUNTER — Ambulatory Visit
Admission: RE | Admit: 2015-02-26 | Discharge: 2015-02-26 | Disposition: A | Discharge status: Home / Self Care | Payer: Medicare Other | Source: Ambulatory Visit | Attending: Radiation Oncology | Admitting: Radiation Oncology

## 2015-03-02 DIAGNOSIS — Z79899 Other long term (current) drug therapy: Secondary | ICD-10-CM | POA: Diagnosis not present

## 2015-03-02 DIAGNOSIS — M0570 Rheumatoid arthritis with rheumatoid factor of unspecified site without organ or systems involvement: Secondary | ICD-10-CM | POA: Diagnosis not present

## 2015-03-05 DIAGNOSIS — C3412 Malignant neoplasm of upper lobe, left bronchus or lung: Secondary | ICD-10-CM | POA: Diagnosis not present

## 2015-03-05 DIAGNOSIS — Z510 Encounter for antineoplastic radiation therapy: Secondary | ICD-10-CM | POA: Diagnosis not present

## 2015-03-05 DIAGNOSIS — F17210 Nicotine dependence, cigarettes, uncomplicated: Secondary | ICD-10-CM | POA: Diagnosis not present

## 2015-03-06 ENCOUNTER — Encounter: Payer: Self-pay | Admitting: Radiation Oncology

## 2015-03-06 ENCOUNTER — Ambulatory Visit
Admission: RE | Admit: 2015-03-06 | Discharge: 2015-03-06 | Disposition: A | Discharge status: Home / Self Care | Payer: Medicare Other | Source: Ambulatory Visit | Attending: Radiation Oncology | Admitting: Radiation Oncology

## 2015-03-06 VITALS — BP 133/59 | HR 95 | Temp 97.5°F | Ht 63.5 in | Wt 153.3 lb

## 2015-03-06 DIAGNOSIS — C3412 Malignant neoplasm of upper lobe, left bronchus or lung: Secondary | ICD-10-CM

## 2015-03-06 DIAGNOSIS — F17210 Nicotine dependence, cigarettes, uncomplicated: Secondary | ICD-10-CM | POA: Diagnosis not present

## 2015-03-06 DIAGNOSIS — Z510 Encounter for antineoplastic radiation therapy: Secondary | ICD-10-CM | POA: Diagnosis not present

## 2015-03-06 NOTE — Progress Notes (Signed)
Ms. Stober presents for her 1st fraction of radiation to her RUL and LUL. She denies any pain at this time. She reports her breathing is normal and her oxygen saturation was 100% on room air today. She reports she is eating well. She has no other concerns at this time.    BP 133/59 mmHg  Pulse 95  Temp(Src) 97.5 F (36.4 C)  Ht 5' 3.5" (1.613 m)  Wt 153 lb 4.8 oz (69.536 kg)  BMI 26.73 kg/m2  SpO2 100%   Wt Readings from Last 3 Encounters:  03/06/15 153 lb 4.8 oz (69.536 kg)  09/06/13 149 lb (67.586 kg)  12/17/12 160 lb (72.576 kg)

## 2015-03-08 NOTE — Progress Notes (Signed)
Department of Radiation Oncology  Phone:  [phone] Fax:        [phone]  Weekly Treatment Note    Name: [name] Date: 03/08/2015 MRN: [id number] DOB: 1940-07-08   Diagnosis:     ICD-9-CM ICD-10-CM   1. 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 albuterol (PROVENTIL HFA;VENTOLIN HFA) 108 (90 BASE) MCG/ACT inhaler Inhale 2 puffs into the lungs every 6 (six) hours as needed for wheezing or shortness of breath. Please instruct in usage. 1 Inhaler 0  . alendronate (FOSAMAX) 70 MG tablet Take 1 tablet by mouth Once a week. On Fridays    . aspirin EC 81 MG tablet Take 81 mg by mouth daily.    . cholecalciferol (VITAMIN D) 1000 UNITS tablet Take 1,000 Units by mouth 2 (two) times daily.     . ferrous sulfate 325 (65 FE) MG tablet Take 325 mg by mouth 2 (two) times daily with a meal.    . folic acid (FOLVITE) 1 MG tablet Take 1 mg by mouth daily.    . methotrexate 2.5 MG tablet Take 7.5 mg by mouth 2 (two) times a week.    . metoprolol tartrate (LOPRESSOR) 25 MG tablet Take 25 mg by mouth 2 (two) times daily.     Marland Kitchen glyBURIDE (DIABETA) 5 MG tablet Take 5 mg by mouth 2 (two) times daily with a meal. Reported on 03/06/2015    . metFORMIN (GLUCOPHAGE) 500 MG tablet Take 500 mg by mouth 2 (two) times daily with a meal. Reported on 03/06/2015    . Multiple Vitamins-Minerals (HCA MULTIVITAMIN/MINERALS PO) Take 1 capsule by mouth 2 (two) times daily. Reported on 03/06/2015    . Multiple Vitamins-Minerals (PRESERVISION AREDS 2) CAPS Take 2 capsules by mouth 2 (two) times daily.    . predniSONE (DELTASONE) 10 MG tablet Start 8/22 . Take 40 mg by mouth daily for 3 days, then take 20 mg by mouth daily for 3 days, then take 10 mg by mouth daily for 3 days, then stop. (Patient not taking: Reported on 02/23/2015) 21  tablet 0   No current facility-administered medications for this encounter.   Facility-Administered Medications Ordered in Other Encounters  Medication Dose Route Frequency Provider Last Rate Last Dose  . 0.9 %  sodium chloride infusion   Intravenous Continuous Hurley Cisco, MD 20 mL/hr at 10/04/10 1339       ALLERGIES: Bupropion and Lopid    LABORATORY DATA:  Lab Results  Component Value Date   WBC 4.6 09/05/2013   HGB 11.0* 09/05/2013   HCT 34.3* 09/05/2013   MCV 98.6 09/05/2013   PLT 103* 09/05/2013   Lab Results  Component Value Date   NA 136* 09/05/2013   K 5.1 09/05/2013   CL 98 09/05/2013   CO2 26 09/05/2013   Lab Results  Component Value Date   ALT 16 12/17/2012   AST 15 12/17/2012   ALKPHOS 105 12/17/2012   BILITOT 0.5 12/17/2012     NARRATIVE: [name] was seen today for weekly treatment management. The chart was checked and the patient's films were reviewed.  [name] presents for her 1st fraction of radiation to her RUL and LUL. She denies any pain at this time. She reports her breathing is normal and her oxygen saturation was  100% on room air today. She reports she is eating well. She has no other concerns at this time.    BP 133/59 mmHg  Pulse 95  Temp(Src) 97.5 F (36.4 C)  Ht 5' 3.5" (1.613 m)  Wt 153 lb 4.8 oz (69.536 kg)  BMI 26.73 kg/m2  SpO2 100%   Wt Readings from Last 3 Encounters:  03/06/15 153 lb 4.8 oz (69.536 kg)  09/06/13 149 lb (67.586 kg)  12/17/12 160 lb (72.576 kg)    PHYSICAL EXAMINATION: height is 5' 3.5" (1.613 m) and weight is 153 lb 4.8 oz (69.536 kg). Her temperature is 97.5 F (36.4 C). Her blood pressure is 133/59 and her pulse is 95. Her oxygen saturation is 100%.        ASSESSMENT: The patient is doing satisfactorily with treatment.  PLAN: We will continue with the patient's radiation treatment as planned.

## 2015-03-09 ENCOUNTER — Ambulatory Visit
Admission: RE | Admit: 2015-03-09 | Discharge: 2015-03-09 | Disposition: A | Discharge status: Home / Self Care | Payer: Medicare Other | Source: Ambulatory Visit | Attending: Radiation Oncology | Admitting: Radiation Oncology

## 2015-03-09 ENCOUNTER — Ambulatory Visit: Payer: Medicare Other | Admitting: Radiation Oncology

## 2015-03-10 ENCOUNTER — Ambulatory Visit: Payer: Medicare Other | Admitting: Radiation Oncology

## 2015-03-10 ENCOUNTER — Ambulatory Visit
Admission: RE | Admit: 2015-03-10 | Discharge: 2015-03-10 | Disposition: A | Discharge status: Home / Self Care | Payer: Medicare Other | Source: Ambulatory Visit | Attending: Radiation Oncology | Admitting: Radiation Oncology

## 2015-03-10 DIAGNOSIS — F17210 Nicotine dependence, cigarettes, uncomplicated: Secondary | ICD-10-CM | POA: Diagnosis not present

## 2015-03-10 DIAGNOSIS — C3412 Malignant neoplasm of upper lobe, left bronchus or lung: Secondary | ICD-10-CM | POA: Diagnosis not present

## 2015-03-10 DIAGNOSIS — Z510 Encounter for antineoplastic radiation therapy: Secondary | ICD-10-CM | POA: Diagnosis not present

## 2015-03-11 ENCOUNTER — Ambulatory Visit
Admission: RE | Admit: 2015-03-11 | Discharge: 2015-03-11 | Disposition: A | Discharge status: Home / Self Care | Payer: Medicare Other | Source: Ambulatory Visit | Attending: Radiation Oncology | Admitting: Radiation Oncology

## 2015-03-11 DIAGNOSIS — Z510 Encounter for antineoplastic radiation therapy: Secondary | ICD-10-CM | POA: Diagnosis not present

## 2015-03-11 DIAGNOSIS — F17210 Nicotine dependence, cigarettes, uncomplicated: Secondary | ICD-10-CM | POA: Diagnosis not present

## 2015-03-11 DIAGNOSIS — C3412 Malignant neoplasm of upper lobe, left bronchus or lung: Secondary | ICD-10-CM | POA: Diagnosis not present

## 2015-03-13 ENCOUNTER — Ambulatory Visit
Admission: RE | Admit: 2015-03-13 | Discharge: 2015-03-13 | Disposition: A | Discharge status: Home / Self Care | Payer: Medicare Other | Source: Ambulatory Visit | Attending: Radiation Oncology | Admitting: Radiation Oncology

## 2015-03-13 DIAGNOSIS — F17210 Nicotine dependence, cigarettes, uncomplicated: Secondary | ICD-10-CM | POA: Diagnosis not present

## 2015-03-13 DIAGNOSIS — C3412 Malignant neoplasm of upper lobe, left bronchus or lung: Secondary | ICD-10-CM | POA: Diagnosis not present

## 2015-03-13 DIAGNOSIS — Z510 Encounter for antineoplastic radiation therapy: Secondary | ICD-10-CM | POA: Diagnosis not present

## 2015-03-16 ENCOUNTER — Ambulatory Visit
Admission: RE | Admit: 2015-03-16 | Discharge: 2015-03-16 | Disposition: A | Discharge status: Home / Self Care | Payer: Medicare Other | Source: Ambulatory Visit | Attending: Radiation Oncology | Admitting: Radiation Oncology

## 2015-03-16 DIAGNOSIS — Z510 Encounter for antineoplastic radiation therapy: Secondary | ICD-10-CM | POA: Diagnosis not present

## 2015-03-16 DIAGNOSIS — F17210 Nicotine dependence, cigarettes, uncomplicated: Secondary | ICD-10-CM | POA: Diagnosis not present

## 2015-03-16 DIAGNOSIS — C3412 Malignant neoplasm of upper lobe, left bronchus or lung: Secondary | ICD-10-CM | POA: Diagnosis not present

## 2015-03-30 ENCOUNTER — Emergency Department (HOSPITAL_COMMUNITY): Payer: Medicare Other

## 2015-03-30 ENCOUNTER — Inpatient Hospital Stay (HOSPITAL_COMMUNITY)
Admission: EM | Admit: 2015-03-30 | Discharge: 2015-04-02 | DRG: 872 | Disposition: A | Discharge status: Home Health | Payer: Medicare Other | Attending: Family Medicine | Admitting: Family Medicine

## 2015-03-30 ENCOUNTER — Encounter (HOSPITAL_COMMUNITY): Payer: Self-pay | Admitting: Emergency Medicine

## 2015-03-30 DIAGNOSIS — C3411 Malignant neoplasm of upper lobe, right bronchus or lung: Secondary | ICD-10-CM | POA: Diagnosis present

## 2015-03-30 DIAGNOSIS — Z7984 Long term (current) use of oral hypoglycemic drugs: Secondary | ICD-10-CM

## 2015-03-30 DIAGNOSIS — R509 Fever, unspecified: Secondary | ICD-10-CM | POA: Diagnosis not present

## 2015-03-30 DIAGNOSIS — E1142 Type 2 diabetes mellitus with diabetic polyneuropathy: Secondary | ICD-10-CM | POA: Diagnosis present

## 2015-03-30 DIAGNOSIS — R1084 Generalized abdominal pain: Secondary | ICD-10-CM

## 2015-03-30 DIAGNOSIS — N179 Acute kidney failure, unspecified: Secondary | ICD-10-CM | POA: Diagnosis present

## 2015-03-30 DIAGNOSIS — J449 Chronic obstructive pulmonary disease, unspecified: Secondary | ICD-10-CM | POA: Diagnosis not present

## 2015-03-30 DIAGNOSIS — M329 Systemic lupus erythematosus, unspecified: Secondary | ICD-10-CM | POA: Diagnosis present

## 2015-03-30 DIAGNOSIS — D649 Anemia, unspecified: Secondary | ICD-10-CM | POA: Diagnosis present

## 2015-03-30 DIAGNOSIS — I10 Essential (primary) hypertension: Secondary | ICD-10-CM | POA: Diagnosis present

## 2015-03-30 DIAGNOSIS — S92333A Displaced fracture of third metatarsal bone, unspecified foot, initial encounter for closed fracture: Secondary | ICD-10-CM | POA: Insufficient documentation

## 2015-03-30 DIAGNOSIS — C3401 Malignant neoplasm of right main bronchus: Secondary | ICD-10-CM | POA: Diagnosis present

## 2015-03-30 DIAGNOSIS — A084 Viral intestinal infection, unspecified: Secondary | ICD-10-CM

## 2015-03-30 DIAGNOSIS — S92343A Displaced fracture of fourth metatarsal bone, unspecified foot, initial encounter for closed fracture: Secondary | ICD-10-CM | POA: Insufficient documentation

## 2015-03-30 DIAGNOSIS — R197 Diarrhea, unspecified: Secondary | ICD-10-CM

## 2015-03-30 DIAGNOSIS — F17210 Nicotine dependence, cigarettes, uncomplicated: Secondary | ICD-10-CM | POA: Diagnosis present

## 2015-03-30 DIAGNOSIS — I959 Hypotension, unspecified: Secondary | ICD-10-CM

## 2015-03-30 DIAGNOSIS — Z7982 Long term (current) use of aspirin: Secondary | ICD-10-CM

## 2015-03-30 DIAGNOSIS — S92353A Displaced fracture of fifth metatarsal bone, unspecified foot, initial encounter for closed fracture: Secondary | ICD-10-CM | POA: Insufficient documentation

## 2015-03-30 DIAGNOSIS — M79671 Pain in right foot: Secondary | ICD-10-CM | POA: Diagnosis not present

## 2015-03-30 DIAGNOSIS — S92323A Displaced fracture of second metatarsal bone, unspecified foot, initial encounter for closed fracture: Secondary | ICD-10-CM | POA: Insufficient documentation

## 2015-03-30 DIAGNOSIS — R110 Nausea: Secondary | ICD-10-CM | POA: Diagnosis not present

## 2015-03-30 DIAGNOSIS — C3412 Malignant neoplasm of upper lobe, left bronchus or lung: Secondary | ICD-10-CM | POA: Diagnosis not present

## 2015-03-30 DIAGNOSIS — K922 Gastrointestinal hemorrhage, unspecified: Secondary | ICD-10-CM | POA: Diagnosis not present

## 2015-03-30 DIAGNOSIS — A419 Sepsis, unspecified organism: Secondary | ICD-10-CM | POA: Diagnosis not present

## 2015-03-30 DIAGNOSIS — R112 Nausea with vomiting, unspecified: Secondary | ICD-10-CM | POA: Diagnosis present

## 2015-03-30 DIAGNOSIS — M25571 Pain in right ankle and joints of right foot: Secondary | ICD-10-CM | POA: Diagnosis not present

## 2015-03-30 DIAGNOSIS — S92321A Displaced fracture of second metatarsal bone, right foot, initial encounter for closed fracture: Secondary | ICD-10-CM | POA: Diagnosis present

## 2015-03-30 DIAGNOSIS — S92352A Displaced fracture of fifth metatarsal bone, left foot, initial encounter for closed fracture: Secondary | ICD-10-CM | POA: Diagnosis present

## 2015-03-30 DIAGNOSIS — S92301A Fracture of unspecified metatarsal bone(s), right foot, initial encounter for closed fracture: Secondary | ICD-10-CM

## 2015-03-30 DIAGNOSIS — N281 Cyst of kidney, acquired: Secondary | ICD-10-CM | POA: Diagnosis not present

## 2015-03-30 DIAGNOSIS — R7309 Other abnormal glucose: Secondary | ICD-10-CM | POA: Diagnosis not present

## 2015-03-30 DIAGNOSIS — R531 Weakness: Secondary | ICD-10-CM | POA: Diagnosis not present

## 2015-03-30 DIAGNOSIS — E119 Type 2 diabetes mellitus without complications: Secondary | ICD-10-CM

## 2015-03-30 DIAGNOSIS — D72829 Elevated white blood cell count, unspecified: Secondary | ICD-10-CM | POA: Diagnosis present

## 2015-03-30 DIAGNOSIS — S92331A Displaced fracture of third metatarsal bone, right foot, initial encounter for closed fracture: Secondary | ICD-10-CM | POA: Diagnosis present

## 2015-03-30 DIAGNOSIS — M797 Fibromyalgia: Secondary | ICD-10-CM | POA: Diagnosis present

## 2015-03-30 DIAGNOSIS — Z923 Personal history of irradiation: Secondary | ICD-10-CM

## 2015-03-30 DIAGNOSIS — Y92013 Bedroom of single-family (private) house as the place of occurrence of the external cause: Secondary | ICD-10-CM

## 2015-03-30 DIAGNOSIS — R739 Hyperglycemia, unspecified: Secondary | ICD-10-CM | POA: Diagnosis not present

## 2015-03-30 DIAGNOSIS — M069 Rheumatoid arthritis, unspecified: Secondary | ICD-10-CM | POA: Diagnosis present

## 2015-03-30 DIAGNOSIS — S92341A Displaced fracture of fourth metatarsal bone, right foot, initial encounter for closed fracture: Secondary | ICD-10-CM | POA: Diagnosis present

## 2015-03-30 DIAGNOSIS — W010XXA Fall on same level from slipping, tripping and stumbling without subsequent striking against object, initial encounter: Secondary | ICD-10-CM | POA: Diagnosis present

## 2015-03-30 DIAGNOSIS — S99921A Unspecified injury of right foot, initial encounter: Secondary | ICD-10-CM | POA: Diagnosis not present

## 2015-03-30 DIAGNOSIS — D696 Thrombocytopenia, unspecified: Secondary | ICD-10-CM | POA: Diagnosis present

## 2015-03-30 DIAGNOSIS — S99911A Unspecified injury of right ankle, initial encounter: Secondary | ICD-10-CM | POA: Diagnosis not present

## 2015-03-30 DIAGNOSIS — N390 Urinary tract infection, site not specified: Secondary | ICD-10-CM | POA: Diagnosis present

## 2015-03-30 HISTORY — DX: Polyneuropathy, unspecified: G62.9

## 2015-03-30 HISTORY — DX: Tobacco use: Z72.0

## 2015-03-30 LAB — CBC WITH DIFFERENTIAL/PLATELET
BASOS ABS: 0 10*3/uL (ref 0.0–0.1)
Basophils Relative: 0 %
Eosinophils Absolute: 0 10*3/uL (ref 0.0–0.7)
Eosinophils Relative: 0 %
HCT: 43 % (ref 36.0–46.0)
Hemoglobin: 13.4 g/dL (ref 12.0–15.0)
LYMPHS ABS: 0.9 10*3/uL (ref 0.7–4.0)
Lymphocytes Relative: 3 %
MCH: 31.2 pg (ref 26.0–34.0)
MCHC: 31.2 g/dL (ref 30.0–36.0)
MCV: 100.2 fL — AB (ref 78.0–100.0)
MONO ABS: 3.5 10*3/uL — AB (ref 0.1–1.0)
MONOS PCT: 12 %
NEUTROS ABS: 24.7 10*3/uL — AB (ref 1.7–7.7)
Neutrophils Relative %: 85 %
PLATELETS: 94 10*3/uL — AB (ref 150–400)
RBC: 4.29 MIL/uL (ref 3.87–5.11)
RDW: 17.5 % — AB (ref 11.5–15.5)
WBC: 29.1 10*3/uL — ABNORMAL HIGH (ref 4.0–10.5)

## 2015-03-30 LAB — COMPREHENSIVE METABOLIC PANEL
ALBUMIN: 3.8 g/dL (ref 3.5–5.0)
ALT: 17 U/L (ref 14–54)
ANION GAP: 11 (ref 5–15)
AST: 22 U/L (ref 15–41)
Alkaline Phosphatase: 80 U/L (ref 38–126)
BUN: 19 mg/dL (ref 6–20)
CALCIUM: 8.8 mg/dL — AB (ref 8.9–10.3)
CHLORIDE: 104 mmol/L (ref 101–111)
CO2: 23 mmol/L (ref 22–32)
Creatinine, Ser: 1.47 mg/dL — ABNORMAL HIGH (ref 0.44–1.00)
GFR calc non Af Amer: 34 mL/min — ABNORMAL LOW (ref >60)
GFR, EST AFRICAN AMERICAN: 39 mL/min — AB (ref >60)
GLUCOSE: 334 mg/dL — AB (ref 65–99)
POTASSIUM: 5.3 mmol/L — AB (ref 3.5–5.1)
SODIUM: 138 mmol/L (ref 135–145)
Total Bilirubin: 0.8 mg/dL (ref 0.3–1.2)
Total Protein: 7.5 g/dL (ref 6.5–8.1)

## 2015-03-30 LAB — LACTIC ACID, PLASMA: LACTIC ACID, VENOUS: 4.2 mmol/L — AB (ref 0.5–2.0)

## 2015-03-30 LAB — POC OCCULT BLOOD, ED: Fecal Occult Bld: POSITIVE — AB

## 2015-03-30 LAB — LIPASE, BLOOD: Lipase: 59 U/L — ABNORMAL HIGH (ref 11–51)

## 2015-03-30 LAB — TROPONIN I: Troponin I: <0.03 ng/mL (ref <0.031)

## 2015-03-30 MED ORDER — SODIUM CHLORIDE 0.9 % IV BOLUS (SEPSIS)
500.0000 mL | Freq: Once | INTRAVENOUS | Status: AC
  Administered 2015-03-31: 500 mL via INTRAVENOUS

## 2015-03-30 MED ORDER — IOHEXOL 300 MG/ML  SOLN: 80.0000 mL | Freq: Once | INTRAMUSCULAR | Status: DC | PRN

## 2015-03-30 MED ORDER — SODIUM CHLORIDE 0.9 % IV SOLN
INTRAVENOUS | Status: DC
  Administered 2015-03-31 – 2015-04-02 (×5): via INTRAVENOUS

## 2015-03-30 MED ORDER — SODIUM CHLORIDE 0.9 % IV BOLUS (SEPSIS)
500.0000 mL | Freq: Once | INTRAVENOUS | Status: AC
  Administered 2015-03-30: 500 mL via INTRAVENOUS

## 2015-03-30 NOTE — ED Notes (Signed)
CRITICAL VALUE ALERT  Critical value received:  Lactic Acid - 4.2  Date of notification:  03/30/2015  Time of notification:  2313  Critical value read back: yes  Nurse who received alert:  LJS  MD notified (1st page):  Dr Thurnell Garbe  Time of first page:  2313  MD notified (2nd page):  Time of second page:  Responding MD:  Dr Thurnell Garbe  Time MD responded:  [phone]

## 2015-03-30 NOTE — ED Provider Notes (Signed)
CSN: [id number]     Arrival date & time 03/30/15  2111 History   First MD Initiated Contact with Patient 03/30/15 2133     Chief Complaint  Patient presents with  . Diarrhea  . Abdominal Pain  . Emesis      HPI Pt was seen at 2145. Per pt's family and pt, c/o gradual onset and persistence of multiple intermittent episodes of N/V/D that began 2 days ago.   Describes the stools as "they were watery" and "now they are black and have blood in them" today. Has been associated with generalized abd "pain." Denies CP/SOB, no back pain, no fevers, no black or blood in emesis. Pt also c/o right foot "pain" after she tripped and fell this afternoon. States the pain worsens with weight bearing and palpation of her foot. Denies LOC, no head injury, no neck or back pain, no focal motor weakness, no tingling/numbness in extremities.    Past Medical History  Diagnosis Date  . Diabetes mellitus     x 20 yrs  . Lupus ([place])     sle  . Fibromyalgia   . Thrombocytopenia, unspecified ([place]) 09/04/2013  . Lung cancer ([place]) 01/28/2015    left upper lobe lung /right upper lobe lung  . Fibromyalgia   . RA (rheumatoid arthritis) ([place])     ra  . Tobacco use   . Peripheral neuropathy [facility])    Past Surgical History  Procedure Laterality Date  . Appendectomy    . Cesarean section  x3  . Oophorectomy    . Cataract extraction w/ intraocular lens implant  2010    right eye  . Abdominal hysterectomy    . Left arm surgery      multiple surgeries on  . Trigger finger release  yrs ago    right hand  . Knee arthroscopy  yrs ago    left knee  . Shoulder hemi-arthroplasty  01/27/2011    Procedure: SHOULDER HEMI-ARTHROPLASTY;  Surgeon: Nita Sells, MD;  Location: [facility];  Service: Orthopedics;  Laterality: Right;  interscaline right shoulder   Family History  Problem Relation Age of Onset  . Family history unknown: Yes   Social History  Substance Use Topics  . Smoking status: Current Every Day  Smoker -- 1.50 packs/day for 40 years    Types: Cigarettes  . Smokeless tobacco: None     Comment: 2 packs a day x 30 yrs  . Alcohol Use: No    Review of Systems ROS: Statement: All systems negative except as marked or noted in the HPI; Constitutional: Negative for fever and chills. ; ; Eyes: Negative for eye pain, redness and discharge. ; ; ENMT: Negative for ear pain, hoarseness, nasal congestion, sinus pressure and sore throat. ; ; Cardiovascular: Negative for chest pain, palpitations, diaphoresis, dyspnea and peripheral edema. ; ; Respiratory: Negative for cough, wheezing and stridor. ; ; Gastrointestinal: +N/V/D, abd pain, blood in stool. Negative for hematemesis, jaundice. . ; ; Genitourinary: Negative for dysuria, flank pain and hematuria. ; ; Musculoskeletal: +right foot pain. Negative for back pain and neck pain. Negative for swelling and deformity.; ; Skin: Negative for pruritus, rash, abrasions, blisters, bruising and skin lesion.; ; Neuro: Negative for headache, lightheadedness and neck stiffness. Negative for weakness, altered level of consciousness , altered mental status, extremity weakness, paresthesias, involuntary movement, seizure and syncope.      Allergies  Bupropion and Lopid   Home Medications   Prior to Admission medications   Medication  Sig Start Date End Date Taking? Authorizing Provider  albuterol (PROVENTIL HFA;VENTOLIN HFA) 108 (90 BASE) MCG/ACT inhaler Inhale 2 puffs into the lungs every 6 (six) hours as needed for wheezing or shortness of breath. Please instruct in usage. 09/06/13  Yes Samuella Cota, MD  alendronate (FOSAMAX) 70 MG tablet Take 1 tablet by mouth Once a week. On Sunday 04/15/11  Yes Historical Provider, MD  aspirin EC 81 MG tablet Take 81 mg by mouth daily.   Yes Historical Provider, MD  cholecalciferol (VITAMIN D) 1000 UNITS tablet Take 1,000 Units by mouth daily.    Yes Historical Provider, MD  folic acid (FOLVITE) 1 MG tablet Take 1 mg by  mouth daily.   Yes Historical Provider, MD  glyBURIDE (DIABETA) 5 MG tablet Take 5 mg by mouth 2 (two) times daily with a meal. Reported on 03/06/2015   Yes Historical Provider, MD  metFORMIN (GLUCOPHAGE) 500 MG tablet Take 500 mg by mouth 2 (two) times daily with a meal. Reported on 03/06/2015   Yes Historical Provider, MD  methotrexate 2.5 MG tablet Take 7.5 mg by mouth 2 (two) times a week.   Yes Historical Provider, MD  metoprolol tartrate (LOPRESSOR) 25 MG tablet Take 25 mg by mouth daily.    Yes Historical Provider, MD  Multiple Vitamins-Minerals (PRESERVISION AREDS 2) CAPS Take 2 capsules by mouth 2 (two) times daily.   Yes Historical Provider, MD  tetrahydrozoline-zinc (VISINE-AC) 0.05-0.25 % ophthalmic solution Place 2 drops into both eyes daily as needed (dry eyes).   Yes Historical Provider, MD  predniSONE (DELTASONE) 10 MG tablet Start 8/22 . Take 40 mg by mouth daily for 3 days, then take 20 mg by mouth daily for 3 days, then take 10 mg by mouth daily for 3 days, then stop. Patient not taking: Reported on 02/23/2015 09/06/13   Samuella Cota, MD   BP 107/36 mmHg  Pulse 107  Temp(Src) 97.6 F (36.4 C) (Rectal)  Resp 21  Ht '5\' 3"'$  (1.6 m)  Wt 153 lb (69.4 kg)  BMI 27.11 kg/m2  SpO2 95%   Patient Vitals for the past 24 hrs:  BP Temp Temp src Pulse Resp SpO2 Height Weight  03/30/15 2230 (!) 107/36 mmHg - - - 21 - - -  03/30/15 2216 (!) 95/50 mmHg - - 107 22 95 % - -  03/30/15 2159 - 97.6 F (36.4 C) Rectal - - - - -  03/30/15 2114 112/64 mmHg (!) 96.4 F (35.8 C) Oral 111 20 100 % '5\' 3"'$  (1.6 m) 153 lb (69.4 kg)     Physical Exam 2150: Physical examination:  Nursing notes reviewed; Vital signs and O2 SAT reviewed;  Constitutional: Well developed, Well nourished, In no acute distress; Head:  Normocephalic, atraumatic; Eyes: EOMI, PERRL, No scleral icterus; ENMT: Mouth and pharynx normal, Mucous membranes dry; Neck: Supple, Full range of motion, No lymphadenopathy; Cardiovascular:  Tachycardic rate and rhythm, No gallop; Respiratory: Breath sounds clear & equal bilaterally, No wheezes.  Speaking full sentences with ease, Normal respiratory effort/excursion; Chest: Nontender, Movement normal; Abdomen: Soft, +diffuse tenderness to palp. No rebound or guarding. Nondistended, Normal bowel sounds. Rectal exam performed w/permission of pt and ED RN chaperone present.  Anal tone normal.  Non-tender, soft maroon stool in rectal vault, heme positive.  No fissures, no external hemorrhoids, no palp masses.; Genitourinary: No CVA tenderness; Extremities: Pulses normal, No deformity. No edema, NT to palp right knee/ankle/toes. +TTP right lateral foot. NMS intact right foot, strong pedal  pp, LE muscle compartments soft.  No right proximal fibular head tenderness.  No deformity, no ecchymosis, no erythema, no abrasions, no open wounds.  +plantarflexion of right foot w/calf squeeze.  No palpable gap right Achilles's tendon. No calf edema or asymmetry.; Neuro: AA&Ox3, Major CN grossly intact.  Speech clear. No gross focal motor or sensory deficits in extremities.; Skin: Color normal, Warm, Dry.   ED Course  Procedures (including critical care time) Labs Review  Imaging Review  I have personally reviewed and evaluated these images and lab results as part of my medical decision-making.   EKG Interpretation   Date/Time:  Monday March 30 2015 22:15:52 EDT Ventricular Rate:  105 PR Interval:  136 QRS Duration: 78 QT Interval:  366 QTC Calculation: 484 R Axis:   48 Text Interpretation:  Sinus tachycardia Atrial premature complexes Low  voltage, extremity and precordial leads When compared with ECG of  09/03/2013 No significant change was found Confirmed by [facility]  MD,  Nunzio Cory [id number]) on 03/30/2015 10:28:05 PM      MDM  MDM Reviewed: previous chart, nursing note and vitals Reviewed previous: labs and ECG Interpretation: labs and ECG   Results for orders placed or performed during  the hospital encounter of 03/30/15  Comprehensive metabolic panel  Result Value Ref Range   Sodium 138 135 - 145 mmol/L   Potassium 5.3 (H) 3.5 - 5.1 mmol/L   Chloride 104 101 - 111 mmol/L   CO2 23 22 - 32 mmol/L   Glucose, Bld 334 (H) 65 - 99 mg/dL   BUN 19 6 - 20 mg/dL   Creatinine, Ser 1.47 (H) 0.44 - 1.00 mg/dL   Calcium 8.8 (L) 8.9 - 10.3 mg/dL   Total Protein 7.5 6.5 - 8.1 g/dL   Albumin 3.8 3.5 - 5.0 g/dL   AST 22 15 - 41 U/L   ALT 17 14 - 54 U/L   Alkaline Phosphatase 80 38 - 126 U/L   Total Bilirubin 0.8 0.3 - 1.2 mg/dL   GFR calc non Af Amer 34 (L) >60 mL/min   GFR calc Af Amer 39 (L) >60 mL/min   Anion gap 11 5 - 15  Lipase, blood  Result Value Ref Range   Lipase 59 (H) 11 - 51 U/L  Lactic acid, plasma  Result Value Ref Range   Lactic Acid, Venous 4.2 (HH) 0.5 - 2.0 mmol/L  CBC with Differential  Result Value Ref Range   WBC 29.1 (H) 4.0 - 10.5 K/uL   RBC 4.29 3.87 - 5.11 MIL/uL   Hemoglobin 13.4 12.0 - 15.0 g/dL   HCT 43.0 36.0 - 46.0 %   MCV 100.2 (H) 78.0 - 100.0 fL   MCH 31.2 26.0 - 34.0 pg   MCHC 31.2 30.0 - 36.0 g/dL   RDW 17.5 (H) 11.5 - 15.5 %   Platelets 94 (L) 150 - 400 K/uL   Neutrophils Relative % 85 %   Lymphocytes Relative 3 %   Monocytes Relative 12 %   Eosinophils Relative 0 %   Basophils Relative 0 %   Neutro Abs 24.7 (H) 1.7 - 7.7 K/uL   Lymphs Abs 0.9 0.7 - 4.0 K/uL   Monocytes Absolute 3.5 (H) 0.1 - 1.0 K/uL   Eosinophils Absolute 0.0 0.0 - 0.7 K/uL   Basophils Absolute 0.0 0.0 - 0.1 K/uL  Troponin I  Result Value Ref Range   Troponin I <0.03 <0.031 ng/mL  POC occult blood, ED  Result Value Ref Range  Fecal Occult Bld POSITIVE (A) NEGATIVE    Results for SIGNA, CHEEK (MRN [id number]) as of 03/30/2015 23:17  Ref. Range 09/04/2013 04:47 09/05/2013 05:25 03/30/2015 21:48  BUN Latest Ref Range: 6-20 mg/dL 25 (H) 33 (H) 19  Creatinine Latest Ref Range: 0.44-1.00 mg/dL 1.01 0.91 1.47 (H)    Ct Abdomen Pelvis Wo Contrast 03/30/2015   CLINICAL DATA:  Acute onset of diarrhea and nausea. Elevated creatinine. Initial encounter. EXAM: CT ABDOMEN AND PELVIS WITHOUT CONTRAST TECHNIQUE: Multidetector CT imaging of the abdomen and pelvis was performed following the standard protocol without IV contrast. COMPARISON:  CT of the abdomen and pelvis from 06/14/2011, and PET/CT performed 06/18/2014 FINDINGS: The visualized lung bases are clear. Scattered coronary artery calcifications are seen. Calcification is noted at the mitral valve. The liver is unremarkable in appearance. The spleen is somewhat bulky, though normal in length, with minimal peripheral calcification possibly reflecting remote trauma. The gallbladder is within normal limits. The pancreas and adrenal glands are unremarkable. A 2.7 cm cyst is noted arising from the left kidney. Mild nonspecific perinephric stranding is noted bilaterally. There is no evidence of hydronephrosis. No renal or ureteral stones are seen. No free fluid is identified. The small bowel is unremarkable in appearance. The stomach is within normal limits. No acute vascular abnormalities are seen. There is mild ectasia of the distal abdominal aorta, and underlying diffuse calcification. Mild to moderate luminal narrowing is suggested at the infrarenal abdominal aorta given the appearance of calcification. There is likely at least moderate luminal narrowing along the common iliac arteries bilaterally. The patient is status post appendectomy. The colon is unremarkable in appearance. The bladder is mildly distended and grossly unremarkable. Postoperative change is noted overlying the bladder. The patient is status post hysterectomy. No suspicious adnexal masses are seen. No inguinal lymphadenopathy is seen. No acute osseous abnormalities are identified. Multilevel vacuum phenomenon is noted along the lower thoracic and lumbar spine. There is mild chronic loss of height at vertebral body T11. IMPRESSION: 1. No acute abnormality  seen to explain the patient's symptoms. 2. Diffuse calcification along the abdominal aorta and its branches. Mild to moderate luminal narrowing suggested at the infrarenal abdominal aorta, given the pattern of calcification. There is likely at least moderate luminal narrowing along the common iliac arteries bilaterally. 3. Scattered coronary artery calcifications seen. Calcification noted at the mitral valve. 4. Left renal cyst noted. 5. Mild degenerative change along the lower thoracic and lumbar spine, with mild chronic loss of height at T11. Electronically Signed   By: Garald Balding M.D.   On: 03/30/2015 23:53   Dg Chest 2 View 03/30/2015  CLINICAL DATA:  Nausea and vomiting. EXAM: CHEST  2 VIEW COMPARISON:  01/28/2015 chest CT FINDINGS: Chronic hyperinflation. Pulmonary nodules seen on recent chest CT are not visualized. There is no edema, consolidation, effusion, or pneumothorax. Normal heart size and aortic contours. Right glenohumeral arthroplasty. IMPRESSION: COPD without acute superimposed finding. Electronically Signed   By: Monte Fantasia M.D.   On: 03/30/2015 23:30   Dg Ankle Complete Right 03/30/2015  CLINICAL DATA:  Fall 2 hours ago with pain in the lateral foot and ankle. Initial encounter. EXAM: RIGHT ANKLE - COMPLETE 3+ VIEW COMPARISON:  None. FINDINGS: Second and fifth metatarsal fractures are partly visualized. Negative for fracture or subluxation at the ankle. Negative hindfoot. IMPRESSION: 1. Negative ankle. 2. See foot radiography concerning metatarsal fractures. Electronically Signed   By: Monte Fantasia M.D.   On: 03/30/2015 23:32  Dg Foot Complete Right 03/30/2015  CLINICAL DATA:  Weakness, fall, foot pain EXAM: RIGHT FOOT COMPLETE - 3+ VIEW COMPARISON:  None. FINDINGS: There is diffuse osteopenia. There is a slightly displaced obliquely-oriented fracture at the base of the left fifth metatarsal bone. Additional subtle irregularities are seen at the bases of the second through  fourth metatarsal bones, also suggestive of slightly displaced or nondisplaced fractures, of uncertain age. IMPRESSION: 1. Slightly displaced obliquely-oriented fracture at the base of the left fifth metatarsal bone. Orientation of the fracture is more suggestive of an avulsion fracture than a Jones fracture, although not pathognomonic for either. 2. Additional subtle irregularities at the bases of the second through fourth metatarsal bones, also suggestive of slightly displaced or nondisplaced fractures, of uncertain age. All may be acute. 3. Alignment at the second through fifth tarsometatarsal joint spaces remain normal. Electronically Signed   By: Franki Cabot M.D.   On: 03/30/2015 23:34    0010:  Udip/UC pending. BC x2 pending. IV abx given for leukocytosis and elevated lactic acid.  IVF given for tachycardia and soft BP with improvement (current HR 80, SBP 120). Right foot XR as above: splint applied. Dx and testing d/w pt and family.  Questions answered.  Verb understanding. Pt will need admission; pt and family are agreeable. Sign out to Dr. Leonides Schanz.  Francine Graven, DO 03/31/15 [phone]

## 2015-03-30 NOTE — ED Notes (Signed)
Pt c/o diarrhea and nausea that started today. Pt states his diarrhea is dark.

## 2015-03-31 ENCOUNTER — Encounter (HOSPITAL_COMMUNITY): Payer: Self-pay | Admitting: *Deleted

## 2015-03-31 DIAGNOSIS — R112 Nausea with vomiting, unspecified: Secondary | ICD-10-CM | POA: Diagnosis present

## 2015-03-31 DIAGNOSIS — S92352A Displaced fracture of fifth metatarsal bone, left foot, initial encounter for closed fracture: Secondary | ICD-10-CM | POA: Diagnosis present

## 2015-03-31 DIAGNOSIS — D696 Thrombocytopenia, unspecified: Secondary | ICD-10-CM | POA: Diagnosis not present

## 2015-03-31 DIAGNOSIS — K922 Gastrointestinal hemorrhage, unspecified: Secondary | ICD-10-CM | POA: Diagnosis not present

## 2015-03-31 DIAGNOSIS — R509 Fever, unspecified: Secondary | ICD-10-CM | POA: Diagnosis not present

## 2015-03-31 DIAGNOSIS — S92309B Fracture of unspecified metatarsal bone(s), unspecified foot, initial encounter for open fracture: Secondary | ICD-10-CM | POA: Diagnosis not present

## 2015-03-31 DIAGNOSIS — E1142 Type 2 diabetes mellitus with diabetic polyneuropathy: Secondary | ICD-10-CM | POA: Diagnosis present

## 2015-03-31 DIAGNOSIS — S92301A Fracture of unspecified metatarsal bone(s), right foot, initial encounter for closed fracture: Secondary | ICD-10-CM

## 2015-03-31 DIAGNOSIS — F17210 Nicotine dependence, cigarettes, uncomplicated: Secondary | ICD-10-CM | POA: Diagnosis present

## 2015-03-31 DIAGNOSIS — N179 Acute kidney failure, unspecified: Secondary | ICD-10-CM | POA: Diagnosis present

## 2015-03-31 DIAGNOSIS — Z7984 Long term (current) use of oral hypoglycemic drugs: Secondary | ICD-10-CM | POA: Diagnosis not present

## 2015-03-31 DIAGNOSIS — N390 Urinary tract infection, site not specified: Secondary | ICD-10-CM | POA: Diagnosis present

## 2015-03-31 DIAGNOSIS — D72829 Elevated white blood cell count, unspecified: Secondary | ICD-10-CM | POA: Diagnosis present

## 2015-03-31 DIAGNOSIS — A084 Viral intestinal infection, unspecified: Secondary | ICD-10-CM | POA: Diagnosis present

## 2015-03-31 DIAGNOSIS — A419 Sepsis, unspecified organism: Secondary | ICD-10-CM | POA: Diagnosis not present

## 2015-03-31 DIAGNOSIS — S92309A Fracture of unspecified metatarsal bone(s), unspecified foot, initial encounter for closed fracture: Secondary | ICD-10-CM | POA: Diagnosis not present

## 2015-03-31 DIAGNOSIS — Y92013 Bedroom of single-family (private) house as the place of occurrence of the external cause: Secondary | ICD-10-CM | POA: Diagnosis not present

## 2015-03-31 DIAGNOSIS — S92301B Fracture of unspecified metatarsal bone(s), right foot, initial encounter for open fracture: Secondary | ICD-10-CM

## 2015-03-31 DIAGNOSIS — R197 Diarrhea, unspecified: Secondary | ICD-10-CM

## 2015-03-31 DIAGNOSIS — S92341A Displaced fracture of fourth metatarsal bone, right foot, initial encounter for closed fracture: Secondary | ICD-10-CM | POA: Diagnosis present

## 2015-03-31 DIAGNOSIS — C3412 Malignant neoplasm of upper lobe, left bronchus or lung: Secondary | ICD-10-CM | POA: Diagnosis present

## 2015-03-31 DIAGNOSIS — M797 Fibromyalgia: Secondary | ICD-10-CM | POA: Diagnosis present

## 2015-03-31 DIAGNOSIS — M069 Rheumatoid arthritis, unspecified: Secondary | ICD-10-CM | POA: Diagnosis present

## 2015-03-31 DIAGNOSIS — I10 Essential (primary) hypertension: Secondary | ICD-10-CM | POA: Diagnosis present

## 2015-03-31 DIAGNOSIS — C3411 Malignant neoplasm of upper lobe, right bronchus or lung: Secondary | ICD-10-CM | POA: Diagnosis not present

## 2015-03-31 DIAGNOSIS — M329 Systemic lupus erythematosus, unspecified: Secondary | ICD-10-CM | POA: Diagnosis present

## 2015-03-31 DIAGNOSIS — J449 Chronic obstructive pulmonary disease, unspecified: Secondary | ICD-10-CM | POA: Diagnosis present

## 2015-03-31 DIAGNOSIS — Z923 Personal history of irradiation: Secondary | ICD-10-CM | POA: Diagnosis not present

## 2015-03-31 DIAGNOSIS — D649 Anemia, unspecified: Secondary | ICD-10-CM | POA: Diagnosis present

## 2015-03-31 DIAGNOSIS — S92331A Displaced fracture of third metatarsal bone, right foot, initial encounter for closed fracture: Secondary | ICD-10-CM | POA: Diagnosis present

## 2015-03-31 DIAGNOSIS — W010XXA Fall on same level from slipping, tripping and stumbling without subsequent striking against object, initial encounter: Secondary | ICD-10-CM | POA: Diagnosis present

## 2015-03-31 DIAGNOSIS — S92321A Displaced fracture of second metatarsal bone, right foot, initial encounter for closed fracture: Secondary | ICD-10-CM | POA: Diagnosis present

## 2015-03-31 DIAGNOSIS — Z7982 Long term (current) use of aspirin: Secondary | ICD-10-CM | POA: Diagnosis not present

## 2015-03-31 LAB — LACTIC ACID, PLASMA
LACTIC ACID, VENOUS: 3.3 mmol/L — AB (ref 0.5–2.0)
LACTIC ACID, VENOUS: 3.1 mmol/L — AB (ref 0.5–2.0)
Lactic Acid, Venous: 2.5 mmol/L (ref 0.5–2.0)

## 2015-03-31 LAB — URINALYSIS, ROUTINE W REFLEX MICROSCOPIC
GLUCOSE, UA: NEGATIVE mg/dL
Nitrite: NEGATIVE
PROTEIN: 100 mg/dL — AB
pH: 5 (ref 5.0–8.0)

## 2015-03-31 LAB — GLUCOSE, CAPILLARY
GLUCOSE-CAPILLARY: 111 mg/dL — AB (ref 65–99)
Glucose-Capillary: 112 mg/dL — ABNORMAL HIGH (ref 65–99)
Glucose-Capillary: 116 mg/dL — ABNORMAL HIGH (ref 65–99)

## 2015-03-31 LAB — GASTROINTESTINAL PANEL BY PCR, STOOL (REPLACES STOOL CULTURE)
Adenovirus F40/41: NOT DETECTED
Astrovirus: NOT DETECTED
CAMPYLOBACTER SPECIES: NOT DETECTED
Cryptosporidium: NOT DETECTED
Cyclospora cayetanensis: NOT DETECTED
E. COLI O157: NOT DETECTED
ENTEROAGGREGATIVE E COLI (EAEC): NOT DETECTED
Entamoeba histolytica: NOT DETECTED
Enteropathogenic E coli (EPEC): NOT DETECTED
Enterotoxigenic E coli (ETEC): NOT DETECTED
GIARDIA LAMBLIA: NOT DETECTED
NOROVIRUS GI/GII: NOT DETECTED
PLESIMONAS SHIGELLOIDES: NOT DETECTED
Rotavirus A: NOT DETECTED
SALMONELLA SPECIES: NOT DETECTED
SAPOVIRUS (I, II, IV, AND V): NOT DETECTED
SHIGELLA/ENTEROINVASIVE E COLI (EIEC): NOT DETECTED
Shiga like toxin producing E coli (STEC): NOT DETECTED
Vibrio cholerae: NOT DETECTED
Vibrio species: NOT DETECTED
Yersinia enterocolitica: NOT DETECTED

## 2015-03-31 LAB — COMPREHENSIVE METABOLIC PANEL
ALBUMIN: 2.9 g/dL — AB (ref 3.5–5.0)
ALK PHOS: 60 U/L (ref 38–126)
ALT: 12 U/L — AB (ref 14–54)
AST: 21 U/L (ref 15–41)
Anion gap: 6 (ref 5–15)
BUN: 19 mg/dL (ref 6–20)
CALCIUM: 7.7 mg/dL — AB (ref 8.9–10.3)
CHLORIDE: 112 mmol/L — AB (ref 101–111)
CO2: 21 mmol/L — AB (ref 22–32)
CREATININE: 1.19 mg/dL — AB (ref 0.44–1.00)
GFR calc non Af Amer: 44 mL/min — ABNORMAL LOW (ref >60)
GFR, EST AFRICAN AMERICAN: 51 mL/min — AB (ref >60)
GLUCOSE: 149 mg/dL — AB (ref 65–99)
Potassium: 4.5 mmol/L (ref 3.5–5.1)
SODIUM: 139 mmol/L (ref 135–145)
Total Bilirubin: 0.5 mg/dL (ref 0.3–1.2)
Total Protein: 6 g/dL — ABNORMAL LOW (ref 6.5–8.1)

## 2015-03-31 LAB — CBC
HCT: 34.3 % — ABNORMAL LOW (ref 36.0–46.0)
HEMOGLOBIN: 10.7 g/dL — AB (ref 12.0–15.0)
MCH: 31.2 pg (ref 26.0–34.0)
MCHC: 31.2 g/dL (ref 30.0–36.0)
MCV: 100 fL (ref 78.0–100.0)
PLATELETS: 72 10*3/uL — AB (ref 150–400)
RBC: 3.43 MIL/uL — AB (ref 3.87–5.11)
RDW: 17.6 % — ABNORMAL HIGH (ref 11.5–15.5)
WBC: 15.8 10*3/uL — AB (ref 4.0–10.5)

## 2015-03-31 LAB — URINE MICROSCOPIC-ADD ON

## 2015-03-31 LAB — C DIFFICILE QUICK SCREEN W PCR REFLEX
C DIFFICILE (CDIFF) TOXIN: NEGATIVE
C DIFFICLE (CDIFF) ANTIGEN: NEGATIVE
C Diff interpretation: NEGATIVE

## 2015-03-31 LAB — MRSA PCR SCREENING: MRSA by PCR: NEGATIVE

## 2015-03-31 MED ORDER — INSULIN ASPART 100 UNIT/ML ~~LOC~~ SOLN
0.0000 [IU] | Freq: Three times a day (TID) | SUBCUTANEOUS | Status: DC
  Administered 2015-04-01 (×3): 1 [IU] via SUBCUTANEOUS
  Administered 2015-04-02 (×2): 2 [IU] via SUBCUTANEOUS

## 2015-03-31 MED ORDER — OXYCODONE-ACETAMINOPHEN 5-325 MG PO TABS
1.0000 | ORAL_TABLET | Freq: Four times a day (QID) | ORAL | Status: DC | PRN
Start: 2015-03-31 — End: 2015-04-02
  Administered 2015-03-31 – 2015-04-01 (×5): 1 via ORAL
  Filled 2015-03-31 (×5): qty 1

## 2015-03-31 MED ORDER — ONDANSETRON HCL 4 MG PO TABS
4.0000 mg | ORAL_TABLET | Freq: Four times a day (QID) | ORAL | Status: DC | PRN
  Administered 2015-04-02: 4 mg via ORAL
  Filled 2015-03-31: qty 1

## 2015-03-31 MED ORDER — VANCOMYCIN HCL IN DEXTROSE 1-5 GM/200ML-% IV SOLN
1000.0000 mg | Freq: Once | INTRAVENOUS | Status: AC
  Administered 2015-03-31: 1000 mg via INTRAVENOUS
  Filled 2015-03-31: qty 200

## 2015-03-31 MED ORDER — ONDANSETRON HCL 4 MG/2ML IJ SOLN: 4.0000 mg | Freq: Four times a day (QID) | INTRAMUSCULAR | Status: DC | PRN

## 2015-03-31 MED ORDER — VANCOMYCIN HCL IN DEXTROSE 1-5 GM/200ML-% IV SOLN
1000.0000 mg | INTRAVENOUS | Status: DC
  Administered 2015-04-01: 1000 mg via INTRAVENOUS

## 2015-03-31 MED ORDER — SODIUM CHLORIDE 0.9 % IV BOLUS (SEPSIS): 1000.0000 mL | Freq: Once | INTRAVENOUS | Status: DC

## 2015-03-31 MED ORDER — SODIUM CHLORIDE 0.9 % IV BOLUS (SEPSIS)
1000.0000 mL | Freq: Once | INTRAVENOUS | Status: AC
  Administered 2015-03-31: 1000 mL via INTRAVENOUS

## 2015-03-31 MED ORDER — DEXTROSE 5 % IV SOLN
2.0000 g | INTRAVENOUS | Status: DC
  Administered 2015-03-31 – 2015-04-01 (×2): 2 g via INTRAVENOUS
  Filled 2015-03-31 (×3): qty 2

## 2015-03-31 MED ORDER — DEXTROSE 5 % IV SOLN
2.0000 g | Freq: Once | INTRAVENOUS | Status: AC
  Administered 2015-03-31: 2 g via INTRAVENOUS
  Filled 2015-03-31: qty 2

## 2015-03-31 NOTE — H&P (Signed)
PCP:   [name]., MD   Chief Complaint:  Diarrhea and vomiting  HPI: 75 year old female who   has a past medical history of Diabetes mellitus; Lupus ([place]); Fibromyalgia; Thrombocytopenia, unspecified ([place]) (09/04/2013); Lung cancer ([place]) (01/28/2015); Fibromyalgia; RA (rheumatoid arthritis) ([place]); Tobacco use; and Peripheral neuropathy ([place]). Today presents to the hospital with multiple episodes of vomiting and diarrhea restarted this morning. Patient says that the stools were bloody. She also has been having generalized abdominal pain. No chest pain or shortness of breath. Patient also tripped and fell this afternoon while she was trying to get out of bed. X-ray of the foot showed slightly displaced obliquely oriented fracture at the base of the left fifth metatarsal bone. Splint was placed in the ED. Patient recently underwent radiation treatment for right upper lobe and left upper lobe mass.  No chemotherapy done as per patient, as she is not a candidate for chemotherapy. In the ED UA was found to be abnormal, white count 29,000.  Allergies:   Allergies  Allergen Reactions  . Bupropion Nausea Only  . Lopid  [Gemfibrozil] Nausea Only      Past Medical History  Diagnosis Date  . Diabetes mellitus     x 20 yrs  . Lupus ([place])     sle  . Fibromyalgia   . Thrombocytopenia, unspecified ([place]) 09/04/2013  . Lung cancer ([place]) 01/28/2015    left upper lobe lung /right upper lobe lung  . Fibromyalgia   . RA (rheumatoid arthritis) ([place])     ra  . Tobacco use   . Peripheral neuropathy [facility])     Past Surgical History  Procedure Laterality Date  . Appendectomy    . Cesarean section  x3  . Oophorectomy    . Cataract extraction w/ intraocular lens implant  2010    right eye  . Abdominal hysterectomy    . Left arm surgery      multiple surgeries on  . Trigger finger release  yrs ago    right hand  . Knee arthroscopy  yrs ago    left knee  . Shoulder hemi-arthroplasty   01/27/2011    Procedure: SHOULDER HEMI-ARTHROPLASTY;  Surgeon: Nita Sells, MD;  Location: [facility];  Service: Orthopedics;  Laterality: Right;  interscaline right shoulder    Prior to Admission medications   Medication Sig Start Date End Date Taking? Authorizing Provider  albuterol (PROVENTIL HFA;VENTOLIN HFA) 108 (90 BASE) MCG/ACT inhaler Inhale 2 puffs into the lungs every 6 (six) hours as needed for wheezing or shortness of breath. Please instruct in usage. 09/06/13  Yes Samuella Cota, MD  alendronate (FOSAMAX) 70 MG tablet Take 1 tablet by mouth Once a week. On Sunday 04/15/11  Yes Historical Provider, MD  aspirin EC 81 MG tablet Take 81 mg by mouth daily.   Yes Historical Provider, MD  cholecalciferol (VITAMIN D) 1000 UNITS tablet Take 1,000 Units by mouth daily.    Yes Historical Provider, MD  folic acid (FOLVITE) 1 MG tablet Take 1 mg by mouth daily.   Yes Historical Provider, MD  glyBURIDE (DIABETA) 5 MG tablet Take 5 mg by mouth 2 (two) times daily with a meal. Reported on 03/06/2015   Yes Historical Provider, MD  metFORMIN (GLUCOPHAGE) 500 MG tablet Take 500 mg by mouth 2 (two) times daily with a meal. Reported on 03/06/2015   Yes Historical Provider, MD  methotrexate 2.5 MG tablet Take 7.5 mg by mouth 2 (two) times a week.  Yes Historical Provider, MD  metoprolol tartrate (LOPRESSOR) 25 MG tablet Take 25 mg by mouth daily.    Yes Historical Provider, MD  Multiple Vitamins-Minerals (PRESERVISION AREDS 2) CAPS Take 2 capsules by mouth 2 (two) times daily.   Yes Historical Provider, MD  tetrahydrozoline-zinc (VISINE-AC) 0.05-0.25 % ophthalmic solution Place 2 drops into both eyes daily as needed (dry eyes).   Yes Historical Provider, MD  predniSONE (DELTASONE) 10 MG tablet Start 8/22 . Take 40 mg by mouth daily for 3 days, then take 20 mg by mouth daily for 3 days, then take 10 mg by mouth daily for 3 days, then stop. Patient not taking: Reported on 02/23/2015 09/06/13   Samuella Cota, MD    Social History:  reports that she has been smoking Cigarettes.  She has a 60 pack-year smoking history. She does not have any smokeless tobacco history on file. She reports that she does not drink alcohol or use illicit drugs.  Family History  Problem Relation Age of Onset  . Family history unknown: Yes    Filed Weights   03/30/15 2114  Weight: 69.4 kg (153 lb)    All the positives are listed in BOLD  Review of Systems:  HEENT: Headache, blurred vision, runny nose, sore throat Neck: Hypothyroidism, hyperthyroidism,,lymphadenopathy Chest : Shortness of breath, history of COPD, Asthma Heart : Chest pain, history of coronary arterey disease GI:  Nausea, vomiting, diarrhea, constipation, GERD GU: Dysuria, urgency, frequency of urination, hematuria Neuro: Stroke, seizures, syncope Psych: Depression, anxiety, hallucinations   Physical Exam: Blood pressure 104/45, pulse 97, temperature 97.6 F (36.4 C), temperature source Rectal, resp. rate 22, height '5\' 3"'$  (1.6 m), weight 69.4 kg (153 lb), SpO2 95 %. Constitutional:   Patient is a well-developed and well-nourished female in no acute distress and cooperative with exam. Head: Normocephalic and atraumatic Mouth: Mucus membranes moist Eyes: PERRL, EOMI, conjunctivae normal Neck: Supple, No Thyromegaly Cardiovascular: RRR, S1 normal, S2 normal Pulmonary/Chest: CTAB, no wheezes, rales, or rhonchi Abdominal: Soft. Generous tenderness to palpation, non-distended, bowel sounds are normal, no masses, organomegaly, or guarding present.  Neurological: A&O x3, Strength is normal and symmetric bilaterally, cranial nerve II-XII are grossly intact, no focal motor deficit, sensory intact to light touch bilaterally.  Extremities : Right foot in splint  Labs on Admission:  Basic Metabolic Panel:  Recent Labs Lab 03/30/15 2148  NA 138  K 5.3*  CL 104  CO2 23  GLUCOSE 334*  BUN 19  CREATININE 1.47*  CALCIUM 8.8*   Liver  Function Tests:  Recent Labs Lab 03/30/15 2148  AST 22  ALT 17  ALKPHOS 80  BILITOT 0.8  PROT 7.5  ALBUMIN 3.8    Recent Labs Lab 03/30/15 2148  LIPASE 59*   CBC:  Recent Labs Lab 03/30/15 2148  WBC 29.1*  NEUTROABS 24.7*  HGB 13.4  HCT 43.0  MCV 100.2*  PLT 94*   Cardiac Enzymes:  Recent Labs Lab 03/30/15 2148  TROPONINI <0.03     Radiological Exams on Admission: Ct Abdomen Pelvis Wo Contrast  03/30/2015  CLINICAL DATA:  Acute onset of diarrhea and nausea. Elevated creatinine. Initial encounter. EXAM: CT ABDOMEN AND PELVIS WITHOUT CONTRAST TECHNIQUE: Multidetector CT imaging of the abdomen and pelvis was performed following the standard protocol without IV contrast. COMPARISON:  CT of the abdomen and pelvis from 06/14/2011, and PET/CT performed 06/18/2014 FINDINGS: The visualized lung bases are clear. Scattered coronary artery calcifications are seen. Calcification is noted at the mitral  valve. The liver is unremarkable in appearance. The spleen is somewhat bulky, though normal in length, with minimal peripheral calcification possibly reflecting remote trauma. The gallbladder is within normal limits. The pancreas and adrenal glands are unremarkable. A 2.7 cm cyst is noted arising from the left kidney. Mild nonspecific perinephric stranding is noted bilaterally. There is no evidence of hydronephrosis. No renal or ureteral stones are seen. No free fluid is identified. The small bowel is unremarkable in appearance. The stomach is within normal limits. No acute vascular abnormalities are seen. There is mild ectasia of the distal abdominal aorta, and underlying diffuse calcification. Mild to moderate luminal narrowing is suggested at the infrarenal abdominal aorta given the appearance of calcification. There is likely at least moderate luminal narrowing along the common iliac arteries bilaterally. The patient is status post appendectomy. The colon is unremarkable in appearance.  The bladder is mildly distended and grossly unremarkable. Postoperative change is noted overlying the bladder. The patient is status post hysterectomy. No suspicious adnexal masses are seen. No inguinal lymphadenopathy is seen. No acute osseous abnormalities are identified. Multilevel vacuum phenomenon is noted along the lower thoracic and lumbar spine. There is mild chronic loss of height at vertebral body T11. IMPRESSION: 1. No acute abnormality seen to explain the patient's symptoms. 2. Diffuse calcification along the abdominal aorta and its branches. Mild to moderate luminal narrowing suggested at the infrarenal abdominal aorta, given the pattern of calcification. There is likely at least moderate luminal narrowing along the common iliac arteries bilaterally. 3. Scattered coronary artery calcifications seen. Calcification noted at the mitral valve. 4. Left renal cyst noted. 5. Mild degenerative change along the lower thoracic and lumbar spine, with mild chronic loss of height at T11. Electronically Signed   By: Garald Balding M.D.   On: 03/30/2015 23:53   Dg Chest 2 View  03/30/2015  CLINICAL DATA:  Nausea and vomiting. EXAM: CHEST  2 VIEW COMPARISON:  01/28/2015 chest CT FINDINGS: Chronic hyperinflation. Pulmonary nodules seen on recent chest CT are not visualized. There is no edema, consolidation, effusion, or pneumothorax. Normal heart size and aortic contours. Right glenohumeral arthroplasty. IMPRESSION: COPD without acute superimposed finding. Electronically Signed   By: Monte Fantasia M.D.   On: 03/30/2015 23:30   Dg Ankle Complete Right  03/30/2015  CLINICAL DATA:  Fall 2 hours ago with pain in the lateral foot and ankle. Initial encounter. EXAM: RIGHT ANKLE - COMPLETE 3+ VIEW COMPARISON:  None. FINDINGS: Second and fifth metatarsal fractures are partly visualized. Negative for fracture or subluxation at the ankle. Negative hindfoot. IMPRESSION: 1. Negative ankle. 2. See foot radiography concerning  metatarsal fractures. Electronically Signed   By: Monte Fantasia M.D.   On: 03/30/2015 23:32   Dg Foot Complete Right  03/30/2015  CLINICAL DATA:  Weakness, fall, foot pain EXAM: RIGHT FOOT COMPLETE - 3+ VIEW COMPARISON:  None. FINDINGS: There is diffuse osteopenia. There is a slightly displaced obliquely-oriented fracture at the base of the left fifth metatarsal bone. Additional subtle irregularities are seen at the bases of the second through fourth metatarsal bones, also suggestive of slightly displaced or nondisplaced fractures, of uncertain age. IMPRESSION: 1. Slightly displaced obliquely-oriented fracture at the base of the left fifth metatarsal bone. Orientation of the fracture is more suggestive of an avulsion fracture than a Jones fracture, although not pathognomonic for either. 2. Additional subtle irregularities at the bases of the second through fourth metatarsal bones, also suggestive of slightly displaced or nondisplaced fractures, of uncertain age.  All may be acute. 3. Alignment at the second through fifth tarsometatarsal joint spaces remain normal. Electronically Signed   By: Franki Cabot M.D.   On: 03/30/2015 23:34    EKG: Independently reviewed. Sinus tachycardia   Assessment/Plan Active Problems:   Sepsis ([place])    Diarrhea    UTI    Guaiac-positive stool   Sepsis Patient presented with heart rate greater than 90, respiration more than 22/m, WBC 29,000, lactate 4.2, and evidence of UTI with abnormal UA. Patient's blood pressure is stable, did not require fluid boluses. Follow urine and blood culture results. Patient started on vancomycin and cefepime per pharmacy consultation.  Diarrhea, guaiac-positive stools Patient started having diarrhea with guaiac-positive stool. Check stool for C. difficile PCR, GI pathogen panel. Continue IV fluids  History of lupus Will hold methotrexate at this time  Fracture fifth metatarsal Right foot in splint. Will consult orthopedic  surgery in a.m.  COPD Stable  Right upper lobe mass Status post radiation treatment. As per patient she was found not to be a candidate for chemotherapy  DVT prophylaxis SCDs   Code status: Full code  Family discussion: No family at bedside   Time Spent on Admission: 60 min  [facility] Pager: [phone] 03/31/2015, 2:12 AM  If 7PM-7AM, please contact night-coverage  [email or web address]  Password [email or web address]

## 2015-03-31 NOTE — Care Management Note (Signed)
Case Management Note  Patient Details  Name: [name] MRN: [id number] Date of Birth: Jun 13, 1940  Subjective/Objective:                  Pt admitted with sepsis. Pt is from home, lives with son and is mostly ind with ADL's but does receive help from son as needed. Pt has cane and walker that she uses when she leaves the home. Pt has CPAP and neb machine but no home O2, no HH services. Pt plans to return home at DC.   Action/Plan: will cont to follow, may need PT eval or HH nurse for f/u  Expected Discharge Date:  04/04/15               Expected Discharge Plan:  Home/Self Care  In-House Referral:     Discharge planning Services  NA  Post Acute Care Choice:  NA Choice offered to:  NA  DME Arranged:    DME Agency:     HH Arranged:    HH Agency:     Status of Service:  In process, will continue to follow  Medicare Important Message Given:    Date Medicare IM Given:    Medicare IM give by:    Date Additional Medicare IM Given:    Additional Medicare Important Message give by:     If discussed at [place] of Stay Meetings, dates discussed:    Additional Comments:  Sherald Barge, RN 03/31/2015, 2:53 PM

## 2015-03-31 NOTE — Progress Notes (Signed)
Pharmacy Antibiotic Note  [name] is a 75 y.o. female admitted on 03/30/2015 with sepsis.  Pharmacy has been consulted for Vancomycin and Cefepime dosing.  Plan: Initial doses given in ED. Cefepime 2gm IV every 24 hours. Vancomycin 1gm IV every 24 hours.  Goal trough 15-20 mcg/mL.  Monitor labs, micro and vitals.   Height: '5\' 3"'$  (160 cm) Weight: 155 lb 6.8 oz (70.5 kg) IBW/kg (Calculated) : 52.4  Temp (24hrs), Avg:97 F (36.1 C), Min:96.4 F (35.8 C), Max:97.6 F (36.4 C)   Recent Labs Lab 03/30/15 2148 03/31/15 0008 03/31/15 0238 03/31/15 0521  WBC 29.1*  --   --  15.8*  CREATININE 1.47*  --   --  1.19*  LATICACIDVEN 4.2* 3.1* 3.3* 2.5*    Estimated Creatinine Clearance: 39 mL/min (by C-G formula based on Cr of 1.19).    Allergies  Allergen Reactions  . Bupropion Nausea Only  . Lopid  [Gemfibrozil] Nausea Only    Antimicrobials this admission: Vancomycin 3/14 >>  Cefepime 3/14 >>   Dose adjustments this admission: n/a  Microbiology results: 3/14 BCx: pending 3/14 UCx: pending  3/14 MRSA PCR: (-)  Thank you for allowing pharmacy to be a part of this patient's care.  Pricilla Larsson 03/31/2015 7:42 AM

## 2015-03-31 NOTE — ED Provider Notes (Addendum)
12:50 AM  Assumed care from Dr. Thurnell Garbe.  Pt is a 75 y.o.  female with history of lupus, rheumatoid arthritis, diabetes who presents emergency department with nausea, vomiting and diarrhea that started 2 days ago. Is having some maroon-colored stools. Patient has been intermittently tachycardic and mildly hypotensive but has been fluid responsive. She has a significant leukocytosis and elevated lactate. Broad-spectrum antibiotic started. Stool cultures pending. She does  appear to have a urinary tract infection. Urine culture pending. Blood cultures pending. IV Vanc and Cefepime ordered.  Will admit to hospitalist. PCP is [place].   Patient states because of her nausea, vomiting and diarrhea she has been lightheaded. States she got up today and fell.  She has a R fifth metatarsal fracture and possible 2-4 metatarsal fractures on the same foot.  Placed in posterior splint.  Will keep NWB on RLE.  Ortho can be consulted non-emergently.  She denies that she hit her head and this fall.   1:10 AM  Discussed patient's case with hospitalist, Dr. Darrick Meigs.  Recommend admission to inpatient, stepdown bed.  I will place holding orders per their request. Patient and family (if present) updated with plan. Care transferred to hospitalist service.  Pt's lactate is slowly improving.  She has had 1L IVF.  Will give another 2L IVF bolus given patient is septic with elevated lactate and intermittent hypotension. She had an ejection fraction of 60-65% on echocardiogram in 2015.    CRITICAL CARE Performed by: Nyra Jabs   Total critical care time: 35 minutes  Critical care time was exclusive of separately billable procedures and treating other patients.  Critical care was necessary to treat or prevent imminent or life-threatening deterioration.  Critical care was time spent personally by me on the following activities: development of treatment plan with patient and/or surrogate as well as nursing, discussions with  consultants, evaluation of patient's response to treatment, examination of patient, obtaining history from patient or surrogate, ordering and performing treatments and interventions, ordering and review of laboratory studies, ordering and review of radiographic studies, pulse oximetry and re-evaluation of patient's condition.    [place], DO 03/31/15 [place], DO 03/31/15 0120

## 2015-03-31 NOTE — Progress Notes (Signed)
ANTIBIOTIC CONSULT NOTE-Preliminary  Pharmacy Consult for vancomycin Indication: leukocytosis  Allergies  Allergen Reactions  . Bupropion Nausea Only  . Lopid  [Gemfibrozil] Nausea Only    Patient Measurements: Height: '5\' 3"'$  (160 cm) Weight: 153 lb (69.4 kg) IBW/kg (Calculated) : 52.4   Vital Signs: Temp: 97.6 F (36.4 C) (03/13 2159) Temp Source: Rectal (03/13 2159) BP: 107/36 mmHg (03/13 2230) Pulse Rate: 107 (03/13 2216)  Labs:  Recent Labs  03/30/15 2148  WBC 29.1*  HGB 13.4  PLT 94*  CREATININE 1.47*    Estimated Creatinine Clearance: 31.4 mL/min (by C-G formula based on Cr of 1.47).  No results for input(s): VANCOTROUGH, VANCOPEAK, VANCORANDOM, GENTTROUGH, GENTPEAK, GENTRANDOM, TOBRATROUGH, TOBRAPEAK, TOBRARND, AMIKACINPEAK, AMIKACINTROU, AMIKACIN in the last 72 hours.   Microbiology: No results found for this or any previous visit (from the past 720 hour(s)).  Medical History: Past Medical History  Diagnosis Date  . Diabetes mellitus     x 20 yrs  . Lupus ([place])     sle  . Fibromyalgia   . Thrombocytopenia, unspecified ([place]) 09/04/2013  . Lung cancer ([place]) 01/28/2015    left upper lobe lung /right upper lobe lung  . Fibromyalgia   . RA (rheumatoid arthritis) ([place])     ra  . Tobacco use   . Peripheral neuropathy ([facility])     Medications:  Scheduled:    Assessment: 75 yo female c/o N/V and diarrhea that has become black and bloody. Vancomycin for leukocytosis.   Goal of Therapy:  Vancomycin trough level 15-20 mcg/ml  Plan:  Preliminary review of pertinent patient information completed.  Protocol will be initiated with a one-time dose(s) of vancomycin 1000 mg.  [name] clinical pharmacist will complete review during morning rounds to assess patient and finalize treatment regimen.  [name], Magdalene Molly, RPH 03/31/2015,12:19 AM

## 2015-03-31 NOTE — ED Notes (Signed)
CRITICAL VALUE ALERT  Critical value received:  Lactic Acid - 3.1  Date of notification:  03/30/2015  Time of notification:  0102  Critical value read back: yes  Nurse who received alert:  LJS  MD notified (1st page):  Dr Leonides Schanz  Time of first page:  0102  MD notified (2nd page):  Time of second page:  Responding MD:  Dr Leonides Schanz  Time MD responded:  [phone]

## 2015-03-31 NOTE — Consult Note (Addendum)
Patient ID: [name], female   DOB: July 20, 1940, 75 y.o.   MRN: [id number]  CONSULT [id number] Detailed history Detailed exam Low complexity  Chief Complaint  Patient presents with  . Diarrhea  . Abdominal Pain  . Emesis    HPI [name] is a 75 y.o. female.  Fell several days ago injured right foot   Location-RIGHT FOOT PAIN  X 2 DAYS  Duration- 2DAYS  Severity-MILD PAIN  Quality-DULL ACHE  Modified by-PRESSURE   Review of Systems (at least 2) Review of Systems 1. DIZZYNESS 2. NAUSEA   Past Medical History  Diagnosis Date  . Diabetes mellitus     x 20 yrs  . Lupus ([place])     sle  . Fibromyalgia   . Thrombocytopenia, unspecified ([place]) 09/04/2013  . Lung cancer ([place]) 01/28/2015    left upper lobe lung /right upper lobe lung  . Fibromyalgia   . RA (rheumatoid arthritis) ([place])     ra  . Tobacco use   . Peripheral neuropathy [facility])      Past Surgical History  Procedure Laterality Date  . Appendectomy    . Cesarean section  x3  . Oophorectomy    . Cataract extraction w/ intraocular lens implant  2010    right eye  . Abdominal hysterectomy    . Left arm surgery      multiple surgeries on  . Trigger finger release  yrs ago    right hand  . Knee arthroscopy  yrs ago    left knee  . Shoulder hemi-arthroplasty  01/27/2011    Procedure: SHOULDER HEMI-ARTHROPLASTY;  Surgeon: Nita Sells, MD;  Location: [facility];  Service: Orthopedics;  Laterality: Right;  interscaline right shoulder     Social History  Social History  Substance Use Topics  . Smoking status: Current Every Day Smoker -- 1.50 packs/day for 40 years    Types: Cigarettes  . Smokeless tobacco: None     Comment: 2 packs a day x 30 yrs  . Alcohol Use: No    Allergies  Allergen Reactions  . Bupropion Nausea Only  . Lopid  [Gemfibrozil] Nausea Only    Current Facility-Administered Medications  Medication Dose Route Frequency Provider Last Rate Last Dose  . 0.9 %  sodium  chloride infusion   Intravenous Continuous Francine Graven, DO 100 mL/hr at 03/31/15 1155    . ceFEPIme (MAXIPIME) 2 g in dextrose 5 % 50 mL IVPB  2 g Intravenous Q24H Kathie Dike, MD      . insulin aspart (novoLOG) injection 0-9 Units  0-9 Units Subcutaneous TID WC Oswald Hillock, MD   0 Units at 03/31/15 0800  . ondansetron (ZOFRAN) tablet 4 mg  4 mg Oral Q6H PRN Oswald Hillock, MD       Or  . ondansetron (ZOFRAN) injection 4 mg  4 mg Intravenous Q6H PRN Oswald Hillock, MD      . oxyCODONE-acetaminophen (PERCOCET/ROXICET) 5-325 MG per tablet 1 tablet  1 tablet Oral Q6H PRN Oswald Hillock, MD   1 tablet at 03/31/15 1155  . [START ON 04/01/2015] vancomycin (VANCOCIN) IVPB 1000 mg/200 mL premix  1,000 mg Intravenous Q24H Kathie Dike, MD       Facility-Administered Medications Ordered in Other Encounters  Medication Dose Route Frequency Provider Last Rate Last Dose  . 0.9 %  sodium chloride infusion   Intravenous Continuous Hurley Cisco, MD 20 mL/hr at 10/04/10 1339  Physical Exam-Detailed Physical Exam  Constitutional: She is oriented to person, place, and time. She appears well-developed and well-nourished. No distress.  HENT:  Head: Normocephalic.  Eyes: Conjunctivae and EOM are normal. Pupils are equal, round, and reactive to light.  Cardiovascular: Normal rate and intact distal pulses.   BOTH DP AND POST TIB   Musculoskeletal:       Right foot: There is decreased range of motion, tenderness, bony tenderness and swelling. There is normal capillary refill, no crepitus, no deformity and no laceration.       Left foot: There is normal range of motion, no tenderness, no swelling, normal capillary refill, no crepitus and no deformity.       Feet:  CURRENTLY ON BED REST   Neurological: She is alert and oriented to person, place, and time. She exhibits normal muscle tone.  Skin: Skin is warm and dry. No rash noted. She is not diaphoretic. No erythema. No pallor.  BOTH FEET    Psychiatric: She has a normal mood and affect. Her behavior is normal. Judgment and thought content normal.    Blood pressure 123/39, pulse 99, temperature 97.9 F (36.6 C), temperature source Oral, resp. rate 18, height '5\' 3"'$  (1.6 m), weight 155 lb 6.8 oz (70.5 kg), SpO2 96 %.  MEDICAL DECISION MAKING (minimum/low)  Data Reviewed  I have personally reviewed the imaging studies and the report and my interpretation is:  4 METATARSAL FRACTURES RIGHT FOOT   Assessment    MULTIPLE METATARSAL FRACTURES RIGHT FOOT NON DISPLACED      Plan    [facility] WALKER WBAT         [name] 03/31/2015, 12:06 PM

## 2015-03-31 NOTE — Progress Notes (Addendum)
Patient admitted to the hospital earlier this morning by Dr. Darrick Meigs  Patient seen and examined. She is no longer having any vomiting, but still having frequent stools which are described as bloody. She doe not have any cough. Abdominal exam is benign.   Patient has been admitted to the hospital with persistent vomiting and diarrhea. Stools are bloody. She was noted to have a significant leukocytosis and concerns for sepsis. C diff is negative and CT abdomen does not show any acute findings. GI pathogen panel in process. She has a possible UTI. She has been started on IV abx and cultures have been sent. Blood pressures are improving and lactic acid is trending down. Last bowel movement was noted to be brown. Her bloody stools may be related to infectious cause. Will continue to monitor CBC and if hemoglobin drops further or she has recurrent bleeding, can consider GI consult.   Her chronic medical issues include lung cancer for which she recently received radiation treatment. She has a chronic thrombocytopenia, which may be worsened by her acute illness.   She does have a right foot fracture for which ortho has been consulted.  [name]

## 2015-04-01 DIAGNOSIS — N179 Acute kidney failure, unspecified: Secondary | ICD-10-CM

## 2015-04-01 DIAGNOSIS — N3000 Acute cystitis without hematuria: Secondary | ICD-10-CM

## 2015-04-01 DIAGNOSIS — A084 Viral intestinal infection, unspecified: Secondary | ICD-10-CM

## 2015-04-01 DIAGNOSIS — R112 Nausea with vomiting, unspecified: Secondary | ICD-10-CM

## 2015-04-01 DIAGNOSIS — S92353A Displaced fracture of fifth metatarsal bone, unspecified foot, initial encounter for closed fracture: Secondary | ICD-10-CM | POA: Insufficient documentation

## 2015-04-01 DIAGNOSIS — S92323A Displaced fracture of second metatarsal bone, unspecified foot, initial encounter for closed fracture: Secondary | ICD-10-CM | POA: Insufficient documentation

## 2015-04-01 DIAGNOSIS — S92343A Displaced fracture of fourth metatarsal bone, unspecified foot, initial encounter for closed fracture: Secondary | ICD-10-CM | POA: Insufficient documentation

## 2015-04-01 DIAGNOSIS — R197 Diarrhea, unspecified: Secondary | ICD-10-CM

## 2015-04-01 DIAGNOSIS — S92333A Displaced fracture of third metatarsal bone, unspecified foot, initial encounter for closed fracture: Secondary | ICD-10-CM | POA: Insufficient documentation

## 2015-04-01 DIAGNOSIS — A419 Sepsis, unspecified organism: Principal | ICD-10-CM

## 2015-04-01 DIAGNOSIS — D696 Thrombocytopenia, unspecified: Secondary | ICD-10-CM

## 2015-04-01 LAB — BASIC METABOLIC PANEL
Anion gap: 5 (ref 5–15)
BUN: 9 mg/dL (ref 6–20)
CO2: 19 mmol/L — ABNORMAL LOW (ref 22–32)
CREATININE: 0.74 mg/dL (ref 0.44–1.00)
Calcium: 8 mg/dL — ABNORMAL LOW (ref 8.9–10.3)
Chloride: 113 mmol/L — ABNORMAL HIGH (ref 101–111)
GFR calc non Af Amer: >60 mL/min (ref >60)
Glucose, Bld: 158 mg/dL — ABNORMAL HIGH (ref 65–99)
Potassium: 3.8 mmol/L (ref 3.5–5.1)
SODIUM: 137 mmol/L (ref 135–145)

## 2015-04-01 LAB — CBC
HEMATOCRIT: 31.2 % — AB (ref 36.0–46.0)
HEMOGLOBIN: 9.9 g/dL — AB (ref 12.0–15.0)
MCH: 31.2 pg (ref 26.0–34.0)
MCHC: 31.7 g/dL (ref 30.0–36.0)
MCV: 98.4 fL (ref 78.0–100.0)
Platelets: 59 10*3/uL — ABNORMAL LOW (ref 150–400)
RBC: 3.17 MIL/uL — AB (ref 3.87–5.11)
RDW: 17.2 % — ABNORMAL HIGH (ref 11.5–15.5)
WBC: 6.5 10*3/uL (ref 4.0–10.5)

## 2015-04-01 LAB — GLUCOSE, CAPILLARY
GLUCOSE-CAPILLARY: 145 mg/dL — AB (ref 65–99)
GLUCOSE-CAPILLARY: 133 mg/dL — AB (ref 65–99)
Glucose-Capillary: 136 mg/dL — ABNORMAL HIGH (ref 65–99)
Glucose-Capillary: 162 mg/dL — ABNORMAL HIGH (ref 65–99)

## 2015-04-01 LAB — HEMOGLOBIN A1C
HEMOGLOBIN A1C: 6.8 % — AB (ref 4.8–5.6)
Mean Plasma Glucose: 148 mg/dL

## 2015-04-01 LAB — URINE CULTURE: Culture: >=100000

## 2015-04-01 MED ORDER — METOPROLOL TARTRATE 25 MG PO TABS
25.0000 mg | ORAL_TABLET | Freq: Two times a day (BID) | ORAL | Status: DC
  Administered 2015-04-01 – 2015-04-02 (×3): 25 mg via ORAL
  Filled 2015-04-01 (×3): qty 1

## 2015-04-01 NOTE — Progress Notes (Signed)
PROGRESS NOTE  [name]:[id number] DOB: [birth year] DOA: 03/30/2015 PCP: [name]., MD  Summary: 79 yof was brought to the hospital with complaints of vomiting and diarrhea. Pt reports that stools were bloody, and has been having abd pain. Denied CP or SOB. Pt also suffered a mechanical fall while getting out of bed. ED UA revealed a white count of 29. She has a possible UTI and her bloody stools ma be related to an infectious cause. X-ray of foot showed fx at fifth metatarsal for which she was placed in a splint. Recently underwent radiation therapy for right upper lob and left upper lobe mass. Per pt, she is not a candidate for chemo.   Assessment/Plan: 1. Possible sepsis on admission. Urinalysis suggested UTI. Blood cultures pending. Urine culture pending. MRSA by PCR negative. Chronic thrombocytopenia, which may be worsened by acute illness. 2. Acute Gastroenteritis with vomiting and diarrhea, suspect viral. Guaiac positive stools. Fecal occult blood positive. C Diff negative. GI panel negative. 3. Acute kidney injury. Resolved. Secondary to gastroenteritis. 4. Normocytic anemia, acute on chronic. Likely related to dilution from IV fluids. 5. Fx of multiple metatarsals right footin ED. Ortho input appreciated. Cam Walker. Weight-bear as tolerated.  6. Chronic thrombocytopenia, stable.  7. Lupus.  stable.  8. COPD. Stable 9.  lung cancer. S/p radiation tx. Was not a candidate for chemo.   Overall improved  Advance diet as tolerated  Follow blood and urine Cx. Continue abx, narrowed to monotherapy. Discontinue vancomycin. MRSA PCA negative.   Continue to hold methotrexate  Expect to discharge in 24 hrs.  Code Status: Full DVT prophylaxis: SCDs Family Communication: son, Richardson Landry present at bedside Disposition Plan: discharge in 24 hrs  Murray Hodgkins, MD  [facility]  Pager [phone] If 7PM-7AM, please contact night-coverage at [email or web address], password  [facility] 04/01/2015, 4:50 AM  LOS: 1 day   Consultants:  Ortho  Procedures:  none  Antibiotics:  Vancomycin 3/14 >> 3/15  Cefepime 3/14 >>   HPI/Subjective: Feels kind of rough. Did not sleep. Pain in RLE. Denies N/V. Breathing is okay. Had only had liquids for breakfast. Denies diarrhea. No abd pain.  RN reports loose stools.  Objective: Filed Vitals:   03/31/15 2300 04/01/15 0000 04/01/15 0100 04/01/15 0200  BP: 126/53 138/61  111/45  Pulse: 111 116 114 106  Temp:  98.3 F (36.8 C)    TempSrc:  Oral    Resp:   19 16  Height:      Weight:      SpO2: 96% 95% 94% 90%    Intake/Output Summary (Last 24 hours) at 04/01/15 0450 Last data filed at 04/01/15 0343  Gross per 24 hour  Intake   2500 ml  Output   2050 ml  Net    450 ml     Filed Weights   03/30/15 2114 03/31/15 0228  Weight: 69.4 kg (153 lb) 70.5 kg (155 lb 6.8 oz)    Exam:    General:  Appears calm and comfortable Eyes: PERRL, normal lids, irises & conjunctiva ENT: grossly normal hearing, lips & tongue Neck: no LAD, masses or thyromegaly Cardiovascular: tachycardia, Regular rhythm, no murmur rub or gallop Telemetry: Sinus tachycardia Respiratory: CTA bilaterally, no w/r/r. Normal respiratory effort. Abdomen: soft, ntnd Musculoskeletal: grossly normal tone BUE/BLE. Splint present on RLE Psychiatric: grossly normal mood and affect, speech fluent and appropriate Neurologic: grossly non-focal.  New data reviewed:  hgb 9.9, BMP unremarkable. Blood sugar 158  Pertinent data since admission:  none  Pending data:  Blood and urine cx  Scheduled Meds: . ceFEPime (MAXIPIME) IV  2 g Intravenous Q24H  . insulin aspart  0-9 Units Subcutaneous TID WC  . vancomycin  1,000 mg Intravenous Q24H   Continuous Infusions: . sodium chloride 100 mL/hr at 03/31/15 2107    Principal Problem:   Sepsis ([place]) Active Problems:   Rheumatoid arthritis ([place])   Diabetes mellitus, type 2 ([place])   Essential  hypertension   Lung cancer, upper lobe (HCC)   Leukocytosis   Nausea vomiting and diarrhea   Thrombocytopenia (HCC)   AKI (acute kidney injury) ([place])   UTI (urinary tract infection)   Fracture of 5th metatarsal   Fracture of 4th metatarsal   Fracture of 3rd metatarsal   Fracture of 2nd metatarsal   Viral gastroenteritis   Time spent 25 minutes  By signing my name below, I, Delene Ruffini, attest that this documentation has been prepared under the direction and in the presence of Bane Hagy P. Sarajane Jews, MD. Electronically Signed: Delene Ruffini, Scribe.  04/01/2015  9:30am  I personally performed the services described in this documentation. All medical record entries made by the scribe were at my direction. I have reviewed the chart and agree that the record reflects my personal performance and is accurate and complete. Murray Hodgkins, MD

## 2015-04-02 LAB — GLUCOSE, CAPILLARY
GLUCOSE-CAPILLARY: 153 mg/dL — AB (ref 65–99)
GLUCOSE-CAPILLARY: 156 mg/dL — AB (ref 65–99)

## 2015-04-02 LAB — CBC
HEMATOCRIT: 29.9 % — AB (ref 36.0–46.0)
HEMOGLOBIN: 9.4 g/dL — AB (ref 12.0–15.0)
MCH: 30.7 pg (ref 26.0–34.0)
MCHC: 31.4 g/dL (ref 30.0–36.0)
MCV: 97.7 fL (ref 78.0–100.0)
Platelets: 53 10*3/uL — ABNORMAL LOW (ref 150–400)
RBC: 3.06 MIL/uL — AB (ref 3.87–5.11)
RDW: 16.7 % — ABNORMAL HIGH (ref 11.5–15.5)
WBC: 3.6 10*3/uL — ABNORMAL LOW (ref 4.0–10.5)

## 2015-04-02 MED ORDER — RISAQUAD PO CAPS
2.0000 | ORAL_CAPSULE | Freq: Every day | ORAL | Status: DC
  Administered 2015-04-02: 2 via ORAL
  Filled 2015-04-02 (×2): qty 2

## 2015-04-02 MED ORDER — CEFUROXIME AXETIL 250 MG PO TABS: 500.0000 mg | ORAL_TABLET | Freq: Two times a day (BID) | ORAL | Status: DC

## 2015-04-02 MED ORDER — OXYCODONE-ACETAMINOPHEN 5-325 MG PO TABS: 1.0000 | ORAL_TABLET | Freq: Four times a day (QID) | ORAL | Status: DC | PRN

## 2015-04-02 MED ORDER — LOPERAMIDE HCL 2 MG PO CAPS
2.0000 mg | ORAL_CAPSULE | ORAL | Status: DC | PRN
  Administered 2015-04-02: 2 mg via ORAL
  Filled 2015-04-02: qty 1

## 2015-04-02 MED ORDER — PANTOPRAZOLE SODIUM 40 MG PO TBEC: 40.0000 mg | DELAYED_RELEASE_TABLET | Freq: Every day | ORAL | Status: DC

## 2015-04-02 MED ORDER — CEFUROXIME AXETIL 500 MG PO TABS: 500.0000 mg | ORAL_TABLET | Freq: Two times a day (BID) | ORAL | Status: DC

## 2015-04-02 MED ORDER — PANTOPRAZOLE SODIUM 40 MG PO TBEC
40.0000 mg | DELAYED_RELEASE_TABLET | Freq: Every day | ORAL | Status: DC
  Administered 2015-04-02: 40 mg via ORAL
  Filled 2015-04-02: qty 1

## 2015-04-02 NOTE — Progress Notes (Signed)
PROGRESS NOTE  [name] ZOX:[id number] DOB: Aug 13, 1940 DOA: 03/30/2015 PCP: [name]., MD  Summary: 50 yof was brought to the hospital with complaints of vomiting and diarrhea. Pt reports that stools were bloody, and has been having abd pain. Denied CP or SOB. Pt also suffered a mechanical fall while getting out of bed. ED UA revealed a white count of 29. She has a possible UTI and her bloody stools ma be related to an infectious cause. X-ray of foot showed fx at fifth metatarsal for which she was placed in a splint. Recently underwent radiation therapy for right upper lob and left upper lobe mass. Per pt, she is not a candidate for chemo.   Assessment/Plan: 1. Sepsis on admission secondary to UTI, resolved. Blood and urine cultures in process with no growth to date. MRSA by PCR negative.  2. Acute gastroenteritis with vomiting and diarrhea, suspect viral. Vomiting resolved. Still has some loose stools. Guaiac positive stools. Fecal occult blood positive. C Diff negative. GI panel negative. 3. Acute kidney injury. Resolved. Secondary to gastroenteritis. 4. Acute blood loss anemia? Versus hemoconcentration on admission. Hemoglobin has stabilized. 5. Fx of multiple metatarsals right footi Ortho input appreciated. Cam Walker. Weight-bear as tolerated.  6. Chronic thrombocytopenia, remains stable. 7. Lupus. Stable..  8. COPD. Stable 9. Lung cancer. S/p radiation tx. Was not a candidate for chemo.    Overall improved. Change to oral antibiotics.  Given drop in hemoglobin and reported blood in stools on admission will ask GI for further comment. Currently stool appears brown without evidence of blood.  Remove flexiseal   Continue ortho recommendations: Cam walker and weight bearing as tolerated.   Follow up GI recommendations.   Potential home later today  Code Status: Full DVT prophylaxis: SCDs Family Communication: No family present.  Disposition Plan: Discharge home  today   Murray Hodgkins, MD  [facility]  Pager [phone] If 7PM-7AM, please contact night-coverage at [email or web address], password [facility] 04/02/2015, 6:10 AM  LOS: 2 days   Consultants:  Ortho  Procedures:  none  Antibiotics:  Vancomycin 3/14 >> 3/15  Cefepime 3/14 >> 3/16  Ceftin 3/16>>  HPI/Subjective: Was able to get rest. Still has mild congestion but is breathing fine. Denies nausea and vomiting.   Objective: Filed Vitals:   04/02/15 0200 04/02/15 0300 04/02/15 0400 04/02/15 0500  BP: 114/44 136/115 136/52 112/35  Pulse: 77 86 83 80  Temp:   98.7 F (37.1 C)   TempSrc:   Oral   Resp: '15 18 19 17  '$ Height:      Weight:    70.9 kg (156 lb 4.9 oz)  SpO2: 96% 96% 96% 94%    Intake/Output Summary (Last 24 hours) at 04/02/15 0610 Last data filed at 04/02/15 0500  Gross per 24 hour  Intake   4030 ml  Output   3325 ml  Net    705 ml     Filed Weights   03/31/15 0228 04/01/15 0500 04/02/15 0500  Weight: 70.5 kg (155 lb 6.8 oz) 69.6 kg (153 lb 7 oz) 70.9 kg (156 lb 4.9 oz)    Exam: Afebrile, VSS, no hypoxia  General:  Appears comfortable, calm. Cardiovascular: Regular rate and rhythm, no murmur, rub or gallop. No lower extremity edema. Telemetry: Sinus rhythm, no arrhythmias  Respiratory: Clear to auscultation bilaterally, no wheezes, rales or rhonchi. Normal respiratory effort. Abdomen: soft, nt, nd Musculoskeletal: Splint on right foot. Normal movement of right toes and perfusion remains intact. Psychiatric: grossly normal  mood and affect, speech fluent and appropriate  New data reviewed:  UOP 2475  CBG stable  Hgb stable 9.4  Platelets stable, 53    Scheduled Meds: . ceFEPime (MAXIPIME) IV  2 g Intravenous Q24H  . insulin aspart  0-9 Units Subcutaneous TID WC  . metoprolol tartrate  25 mg Oral BID   Continuous Infusions: . sodium chloride 100 mL/hr at 04/02/15 0500    Principal Problem:   Sepsis ([place]) Active Problems:   Rheumatoid  arthritis ([place])   Diabetes mellitus, type 2 ([place])   Essential hypertension   Lung cancer, upper lobe (HCC)   Leukocytosis   Nausea vomiting and diarrhea   Thrombocytopenia (HCC)   AKI (acute kidney injury) ([place])   UTI (urinary tract infection)   Fracture of 5th metatarsal   Fracture of 4th metatarsal   Fracture of 3rd metatarsal   Fracture of 2nd metatarsal   Viral gastroenteritis   By signing my name below, I, Rennis Harding attest that this documentation has been prepared under the direction and in the presence of Murray Hodgkins, MD Electronically signed: Rennis Harding  04/02/2015 9:57am   I personally performed the services described in this documentation. All medical record entries made by the scribe were at my direction. I have reviewed the chart and agree that the record reflects my personal performance and is accurate and complete. Murray Hodgkins, MD

## 2015-04-02 NOTE — Care Management Note (Addendum)
Case Management Note  Patient Details  Name: [name] MRN: [id number] Date of Birth: 1940/05/19  Expected Discharge Date:  04/04/15               Expected Discharge Plan:  [place]  In-House Referral:     Discharge planning Services  CM Consult  Post Acute Care Choice:  Home Health Choice offered to:  Patient  DME Arranged:    DME Agency:     HH Arranged:  PT [place]:  [place]  Status of Service:  Completed, signed off  Medicare Important Message Given:  Yes Date Medicare IM Given:    Medicare IM give by:    Date Additional Medicare IM Given:    Additional Medicare Important Message give by:     If discussed at [place] of Stay Meetings, dates discussed:    Additional Comments: Anticipate DC home later today. Pt broke multiple toes during a fall PTA. Pt now in a CAM boot and has no adjusted to walking in the boot. Pt has a history of falling and will need HH PT to help with balance. Pt has used [facility] in the past and would like to use them again. Pt says she has a cane and walker to use at home if needed. And her son is able to provide support as needed. Romualdo Bolk, of [facility], made aware of referral and potential DC home today. Vaughan Basta will obtain pt info from chart. Pt is aware HH has 48 hours to initiate services. No further CM needs anticipated.   Sherald Barge, RN 04/02/2015, 3:03 PM

## 2015-04-02 NOTE — Progress Notes (Signed)
Discharge instructions, FMLA papers and prescription given, verbalized understanding, out in stable condition via w/c with staff.

## 2015-04-02 NOTE — Progress Notes (Signed)
CAM boot applied, attempted to ambulate patient to assess the need for PT, patient unable to walk without assistance x 2, Jolene Provost, CM notified.

## 2015-04-02 NOTE — Consult Note (Signed)
   [facility] CM Inpatient Consult   04/02/2015  [name] Mar 29, 1940 [id number]  Patient screened for potential [place] Management services. Patient is eligible for [place]. Epic reveals there are no identifiable [facility] care management needs at this time. [facility] Care Management services not appropriate at this time. If patient's post hospital needs change please place a [facility] Care Management consult.  For questions please contact:    Royetta Crochet. Laymond Purser, RN, BSN, [place] [phone]) Business Cell  [phone]) Toll Free Office

## 2015-04-02 NOTE — Care Management Important Message (Signed)
Important Message  Patient Details  Name: [name] MRN: [id number] Date of Birth: March 26, 1940   Medicare Important Message Given:  Yes    Sherald Barge, RN 04/02/2015, 3:02 PM

## 2015-04-02 NOTE — Discharge Summary (Addendum)
Physician Discharge Summary  [name]:[id number] DOB: 1940-05-03 DOA: 03/30/2015  PCP: [name]., MD  Admit date: 03/30/2015 Discharge date: 04/02/2015  Recommendations for Outpatient Follow-up:   Follow up with gastroenterology for anemia, see discussion below. Office will contact with appointment.  Follow-up with orthopedics for fracture care  Home health physical therapy  Follow up chronic thrombocytopenia of unclear etiology and significance   Follow-up Information    Follow up with [facility].   Contact information:   [address] [place] [address] [phone]       Follow up with [name]., MD. Schedule an appointment as soon as possible for a visit in 2 weeks.   Specialty:  Internal Medicine   Contact information:   [address] [place] [address] [phone]       Follow up with Arther Abbott, MD. Schedule an appointment as soon as possible for a visit in 2 weeks.   Specialties:  Orthopedic Surgery, Radiology   Contact information:   [address] [place] [address] [phone]       Follow up with Rogene Houston, MD.   Specialty:  Gastroenterology   Why:  office will contact you for appointment   Contact information:   [place], [place] [place] [address] [phone]      Discharge Diagnoses:  1. Sepsis secondary to UTI 2. UTI 3. Acute gastroenteritis with vomiting and diarrhea 4. Acute kidney injury 5. Normocytic anemia. 6. Fx of multiple metatarsals right foot. 7. Chronic thrombocytopenia. 8. Lupus.  9. COPD.  10. Lung cancer.  Discharge Condition: Improved  Disposition: Discharge home   Diet recommendation: Heart healthy   Filed Weights   03/31/15 0228 04/01/15 0500 04/02/15 0500  Weight: 70.5 kg (155 lb 6.8 oz) 69.6 kg (153 lb 7 oz) 70.9 kg (156 lb 4.9 oz)    History of present illness:  50 yow presented with vomiting, diarrhea, reportedly  bloody stool, mechanical fall. Initial evaluation suggested sepsis, UTI, acute kidney injury and fractures of multiple metatarsals right foot.  Hospital Course:  Treated with fluids, antibiotics with a rapid improvement with resolution of sepsis. Transition to oral antibiotics. Vomiting and diarrhea essentially resolved and is likely viral in nature. C. difficile was negative. Acute kidney injury resolved with IV fluids. Initial  "normal" hemoglobin noted, decreased with IV fluids secondary to dilution. No evidence of bleeding during hospitalization. Suspect baseline hemoglobin around 10, therefore doubt acute blood loss anemia. Her gastroenterologist Dr. Laural Golden will make plans for close outpatient follow-up. She's had no bleeding here and her hemoglobin has been stable. Further evaluation in the outpatient setting is appropriate. She was seen by orthopedics for fracture care with recommendations for boot and weightbearing as tolerated. Hospitalization was uncomplicated  Individual issues as below:  1. Sepsis on admission secondary to UTI, resolved. Blood and urine cultures in process with no growth to date. MRSA by PCR negative.  2. Acute gastroenteritis with vomiting and diarrhea, suspect viral. Vomiting resolved. Still has some loose stools. Guaiac positive stools. Fecal occult blood positive. C Diff negative. GI panel negative. 3. Acute kidney injury. Resolved. Secondary to gastroenteritis. 4. Acute blood loss anemia? Versus hemoconcentration on admission. Hemoglobin has stabilized. 5. Fx of multiple metatarsals right footi Ortho input appreciated. Cam Walker. Weight-bear as tolerated.  6. Chronic thrombocytopenia, remains stable. 7. Lupus. Stable..  8. COPD. Stable 9. Lung cancer. S/p radiation tx. Was not a candidate for chemo.  Consultants:  Ortho  Procedures:  None  Antibiotics:  Vancomycin 3/14 >> 3/15  Cefepime 3/14 >> 3/16  Ceftin 3/16>> 3/20  Discharge  Instructions  Discharge Instructions    Diet - low sodium heart healthy    Complete by:  As directed      Discharge instructions    Complete by:  As directed   Call your physician or seek immediate medical attention for pain, diarrhea, fever, bleeding or worsening of condition. Keep boot on foot, especially for walking.     Increase activity slowly    Complete by:  As directed           Discharge Medication List as of 04/02/2015  5:12 PM    START taking these medications   Details  cefUROXime (CEFTIN) 500 MG tablet Take 1 tablet (500 mg total) by mouth 2 (two) times daily with a meal., Starting 04/02/2015, Until Discontinued, Normal    oxyCODONE-acetaminophen (PERCOCET/ROXICET) 5-325 MG tablet Take 1 tablet by mouth every 6 (six) hours as needed for moderate pain., Starting 04/02/2015, Until Discontinued, Print    pantoprazole (PROTONIX) 40 MG tablet Take 1 tablet (40 mg total) by mouth daily before breakfast., Starting 04/02/2015, Until Discontinued, Normal      CONTINUE these medications which have NOT CHANGED   Details  albuterol (PROVENTIL HFA;VENTOLIN HFA) 108 (90 BASE) MCG/ACT inhaler Inhale 2 puffs into the lungs every 6 (six) hours as needed for wheezing or shortness of breath. Please instruct in usage., Starting 09/06/2013, Until Discontinued, Print    alendronate (FOSAMAX) 70 MG tablet Take 1 tablet by mouth Once a week. On Sunday, Starting 04/15/2011, Until Discontinued, Historical Med    aspirin EC 81 MG tablet Take 81 mg by mouth daily., Until Discontinued, Historical Med    cholecalciferol (VITAMIN D) 1000 UNITS tablet Take 1,000 Units by mouth daily. , Until Discontinued, Historical Med    folic acid (FOLVITE) 1 MG tablet Take 1 mg by mouth daily., Until Discontinued, Historical Med    glyBURIDE (DIABETA) 5 MG tablet Take 5 mg by mouth 2 (two) times daily with a meal. Reported on 03/06/2015, Until Discontinued, Historical Med    metFORMIN (GLUCOPHAGE) 500 MG tablet Take  500 mg by mouth 2 (two) times daily with a meal. Reported on 03/06/2015, Until Discontinued, Historical Med    methotrexate 2.5 MG tablet Take 7.5 mg by mouth 2 (two) times a week., Until Discontinued, Historical Med    metoprolol tartrate (LOPRESSOR) 25 MG tablet Take 25 mg by mouth daily. , Until Discontinued, Historical Med    Multiple Vitamins-Minerals (PRESERVISION AREDS 2) CAPS Take 2 capsules by mouth 2 (two) times daily., Until Discontinued, Historical Med    tetrahydrozoline-zinc (VISINE-AC) 0.05-0.25 % ophthalmic solution Place 2 drops into both eyes daily as needed (dry eyes)., Until Discontinued, Historical Med      STOP taking these medications     predniSONE (DELTASONE) 10 MG tablet        Allergies  Allergen Reactions  . Bupropion Nausea Only  . Lopid  [Gemfibrozil] Nausea Only    The results of significant diagnostics from this hospitalization (including imaging, microbiology, ancillary and laboratory) are listed below for reference.    Significant Diagnostic Studies: Ct Abdomen Pelvis Wo Contrast  03/30/2015  CLINICAL DATA:  Acute onset of diarrhea and nausea. Elevated creatinine. Initial encounter. EXAM: CT ABDOMEN AND PELVIS WITHOUT CONTRAST TECHNIQUE: Multidetector CT imaging of the abdomen and pelvis was performed following the standard protocol without IV contrast. COMPARISON:  CT of the  abdomen and pelvis from 06/14/2011, and PET/CT performed 06/18/2014 FINDINGS: The visualized lung bases are clear. Scattered coronary artery calcifications are seen. Calcification is noted at the mitral valve. The liver is unremarkable in appearance. The spleen is somewhat bulky, though normal in length, with minimal peripheral calcification possibly reflecting remote trauma. The gallbladder is within normal limits. The pancreas and adrenal glands are unremarkable. A 2.7 cm cyst is noted arising from the left kidney. Mild nonspecific perinephric stranding is noted bilaterally. There is  no evidence of hydronephrosis. No renal or ureteral stones are seen. No free fluid is identified. The small bowel is unremarkable in appearance. The stomach is within normal limits. No acute vascular abnormalities are seen. There is mild ectasia of the distal abdominal aorta, and underlying diffuse calcification. Mild to moderate luminal narrowing is suggested at the infrarenal abdominal aorta given the appearance of calcification. There is likely at least moderate luminal narrowing along the common iliac arteries bilaterally. The patient is status post appendectomy. The colon is unremarkable in appearance. The bladder is mildly distended and grossly unremarkable. Postoperative change is noted overlying the bladder. The patient is status post hysterectomy. No suspicious adnexal masses are seen. No inguinal lymphadenopathy is seen. No acute osseous abnormalities are identified. Multilevel vacuum phenomenon is noted along the lower thoracic and lumbar spine. There is mild chronic loss of height at vertebral body T11. IMPRESSION: 1. No acute abnormality seen to explain the patient's symptoms. 2. Diffuse calcification along the abdominal aorta and its branches. Mild to moderate luminal narrowing suggested at the infrarenal abdominal aorta, given the pattern of calcification. There is likely at least moderate luminal narrowing along the common iliac arteries bilaterally. 3. Scattered coronary artery calcifications seen. Calcification noted at the mitral valve. 4. Left renal cyst noted. 5. Mild degenerative change along the lower thoracic and lumbar spine, with mild chronic loss of height at T11. Electronically Signed   By: Garald Balding M.D.   On: 03/30/2015 23:53   Dg Chest 2 View  03/30/2015  CLINICAL DATA:  Nausea and vomiting. EXAM: CHEST  2 VIEW COMPARISON:  01/28/2015 chest CT FINDINGS: Chronic hyperinflation. Pulmonary nodules seen on recent chest CT are not visualized. There is no edema, consolidation,  effusion, or pneumothorax. Normal heart size and aortic contours. Right glenohumeral arthroplasty. IMPRESSION: COPD without acute superimposed finding. Electronically Signed   By: Monte Fantasia M.D.   On: 03/30/2015 23:30   Dg Ankle Complete Right  03/30/2015  CLINICAL DATA:  Fall 2 hours ago with pain in the lateral foot and ankle. Initial encounter. EXAM: RIGHT ANKLE - COMPLETE 3+ VIEW COMPARISON:  None. FINDINGS: Second and fifth metatarsal fractures are partly visualized. Negative for fracture or subluxation at the ankle. Negative hindfoot. IMPRESSION: 1. Negative ankle. 2. See foot radiography concerning metatarsal fractures. Electronically Signed   By: Monte Fantasia M.D.   On: 03/30/2015 23:32   Dg Foot Complete Right  03/30/2015  CLINICAL DATA:  Weakness, fall, foot pain EXAM: RIGHT FOOT COMPLETE - 3+ VIEW COMPARISON:  None. FINDINGS: There is diffuse osteopenia. There is a slightly displaced obliquely-oriented fracture at the base of the left fifth metatarsal bone. Additional subtle irregularities are seen at the bases of the second through fourth metatarsal bones, also suggestive of slightly displaced or nondisplaced fractures, of uncertain age. IMPRESSION: 1. Slightly displaced obliquely-oriented fracture at the base of the left fifth metatarsal bone. Orientation of the fracture is more suggestive of an avulsion fracture than a Jones fracture, although not  pathognomonic for either. 2. Additional subtle irregularities at the bases of the second through fourth metatarsal bones, also suggestive of slightly displaced or nondisplaced fractures, of uncertain age. All may be acute. 3. Alignment at the second through fifth tarsometatarsal joint spaces remain normal. Electronically Signed   By: Franki Cabot M.D.   On: 03/30/2015 23:34    Microbiology: Recent Results (from the past 240 hour(s))  Urine culture     Status: None   Collection Time: 03/31/15 12:04 AM  Result Value Ref Range Status    Specimen Description URINE, CATHETERIZED  Final   Special Requests NONE  Final   Culture   Final    >=100,000 COLONIES/mL AEROCOCCUS URINAE Performed at [facility]    Report Status 04/01/2015 FINAL  Final  Culture, blood (routine x 2)     Status: None (Preliminary result)   Collection Time: 03/31/15 12:08 AM  Result Value Ref Range Status   Specimen Description BLOOD RIGHT ANTECUBITAL  Final   Special Requests BOTTLES DRAWN AEROBIC AND ANAEROBIC 8cc each  Final   Culture NO GROWTH 2 DAYS  Final   Report Status PENDING  Incomplete  Culture, blood (routine x 2)     Status: None (Preliminary result)   Collection Time: 03/31/15 12:23 AM  Result Value Ref Range Status   Specimen Description BLOOD RIGHT FOREARM  Final   Special Requests BOTTLES DRAWN AEROBIC AND ANAEROBIC 8CC EACH  Final   Culture NO GROWTH 2 DAYS  Final   Report Status PENDING  Incomplete  MRSA PCR Screening     Status: None   Collection Time: 03/31/15  2:20 AM  Result Value Ref Range Status   MRSA by PCR NEGATIVE NEGATIVE Final    Comment:        The GeneXpert MRSA Assay (FDA approved for NASAL specimens only), is one component of a comprehensive MRSA colonization surveillance program. It is not intended to diagnose MRSA infection nor to guide or monitor treatment for MRSA infections.   Gastrointestinal Panel by PCR , Stool     Status: None   Collection Time: 03/31/15  2:30 AM  Result Value Ref Range Status   Campylobacter species NOT DETECTED NOT DETECTED Final   Plesimonas shigelloides NOT DETECTED NOT DETECTED Final   Salmonella species NOT DETECTED NOT DETECTED Final   Yersinia enterocolitica NOT DETECTED NOT DETECTED Final   Vibrio species NOT DETECTED NOT DETECTED Final   Vibrio cholerae NOT DETECTED NOT DETECTED Final   Enteroaggregative E coli (EAEC) NOT DETECTED NOT DETECTED Final   Enteropathogenic E coli (EPEC) NOT DETECTED NOT DETECTED Final   Enterotoxigenic E coli (ETEC) NOT DETECTED  NOT DETECTED Final   Shiga like toxin producing E coli (STEC) NOT DETECTED NOT DETECTED Final   E. coli O157 NOT DETECTED NOT DETECTED Final   Shigella/Enteroinvasive E coli (EIEC) NOT DETECTED NOT DETECTED Final   Cryptosporidium NOT DETECTED NOT DETECTED Final   Cyclospora cayetanensis NOT DETECTED NOT DETECTED Final   Entamoeba histolytica NOT DETECTED NOT DETECTED Final   Giardia lamblia NOT DETECTED NOT DETECTED Final   Adenovirus F40/41 NOT DETECTED NOT DETECTED Final   Astrovirus NOT DETECTED NOT DETECTED Final   Norovirus GI/GII NOT DETECTED NOT DETECTED Final   Rotavirus A NOT DETECTED NOT DETECTED Final   Sapovirus (I, II, IV, and V) NOT DETECTED NOT DETECTED Final  C difficile quick scan w PCR reflex     Status: None   Collection Time: 03/31/15  2:30 AM  Result Value Ref Range Status   C Diff antigen NEGATIVE NEGATIVE Final   C Diff toxin NEGATIVE NEGATIVE Final   C Diff interpretation Negative for toxigenic C. difficile  Final     Labs: Basic Metabolic Panel:  Recent Labs Lab 03/30/15 2148 03/31/15 0521 04/01/15 0447  NA 138 139 137  K 5.3* 4.5 3.8  CL 104 112* 113*  CO2 23 21* 19*  GLUCOSE 334* 149* 158*  BUN '19 19 9  '$ CREATININE 1.47* 1.19* 0.74  CALCIUM 8.8* 7.7* 8.0*   Liver Function Tests:  Recent Labs Lab 03/30/15 2148 03/31/15 0521  AST 22 21  ALT 17 12*  ALKPHOS 80 60  BILITOT 0.8 0.5  PROT 7.5 6.0*  ALBUMIN 3.8 2.9*    Recent Labs Lab 03/30/15 2148  LIPASE 59*   CBC:  Recent Labs Lab 03/30/15 2148 03/31/15 0521 04/01/15 0447 04/02/15 0900  WBC 29.1* 15.8* 6.5 3.6*  NEUTROABS 24.7*  --   --   --   HGB 13.4 10.7* 9.9* 9.4*  HCT 43.0 34.3* 31.2* 29.9*  MCV 100.2* 100.0 98.4 97.7  PLT 94* 72* 59* 53*   Cardiac Enzymes:  Recent Labs Lab 03/30/15 2148  TROPONINI <0.03    CBG:  Recent Labs Lab 04/01/15 1134 04/01/15 1637 04/01/15 2200 04/02/15 0753 04/02/15 1141  GLUCAP 145* 133* 162* 153* 156*    Principal  Problem:   Sepsis ([place]) Active Problems:   Rheumatoid arthritis (HCC)   Diabetes mellitus, type 2 (HCC)   Essential hypertension   Lung cancer, upper lobe (HCC)   Leukocytosis   Nausea vomiting and diarrhea   Thrombocytopenia (HCC)   AKI (acute kidney injury) ([place])   UTI (urinary tract infection)   Fracture of 5th metatarsal   Fracture of 4th metatarsal   Fracture of 3rd metatarsal   Fracture of 2nd metatarsal   Viral gastroenteritis   Time coordinating discharge: 35 minutes   Signed:  Murray Hodgkins, MD [facility] 04/02/2015, 5:34 PM    By signing my name below, I, Rennis Harding attest that this documentation has been prepared under the direction and in the presence of Murray Hodgkins, MD Electronically signed: Rennis Harding  04/02/2015   I personally performed the services described in this documentation. All medical record entries made by the scribe were at my direction. I have reviewed the chart and agree that the record reflects my personal performance and is accurate and complete. Murray Hodgkins, MD

## 2015-04-04 DIAGNOSIS — N390 Urinary tract infection, site not specified: Secondary | ICD-10-CM | POA: Diagnosis not present

## 2015-04-04 DIAGNOSIS — G629 Polyneuropathy, unspecified: Secondary | ICD-10-CM | POA: Diagnosis not present

## 2015-04-04 DIAGNOSIS — E119 Type 2 diabetes mellitus without complications: Secondary | ICD-10-CM | POA: Diagnosis not present

## 2015-04-04 DIAGNOSIS — M329 Systemic lupus erythematosus, unspecified: Secondary | ICD-10-CM | POA: Diagnosis not present

## 2015-04-04 DIAGNOSIS — S92352D Displaced fracture of fifth metatarsal bone, left foot, subsequent encounter for fracture with routine healing: Secondary | ICD-10-CM | POA: Diagnosis not present

## 2015-04-04 DIAGNOSIS — J449 Chronic obstructive pulmonary disease, unspecified: Secondary | ICD-10-CM | POA: Diagnosis not present

## 2015-04-04 DIAGNOSIS — C3411 Malignant neoplasm of upper lobe, right bronchus or lung: Secondary | ICD-10-CM | POA: Diagnosis not present

## 2015-04-04 DIAGNOSIS — F329 Major depressive disorder, single episode, unspecified: Secondary | ICD-10-CM | POA: Diagnosis not present

## 2015-04-04 DIAGNOSIS — M797 Fibromyalgia: Secondary | ICD-10-CM | POA: Diagnosis not present

## 2015-04-04 DIAGNOSIS — F17210 Nicotine dependence, cigarettes, uncomplicated: Secondary | ICD-10-CM | POA: Diagnosis not present

## 2015-04-04 DIAGNOSIS — D649 Anemia, unspecified: Secondary | ICD-10-CM | POA: Diagnosis not present

## 2015-04-04 DIAGNOSIS — C3412 Malignant neoplasm of upper lobe, left bronchus or lung: Secondary | ICD-10-CM | POA: Diagnosis not present

## 2015-04-04 DIAGNOSIS — A419 Sepsis, unspecified organism: Secondary | ICD-10-CM | POA: Diagnosis not present

## 2015-04-04 DIAGNOSIS — M069 Rheumatoid arthritis, unspecified: Secondary | ICD-10-CM | POA: Diagnosis not present

## 2015-04-05 LAB — CULTURE, BLOOD (ROUTINE X 2)
CULTURE: NO GROWTH
CULTURE: NO GROWTH

## 2015-04-06 DIAGNOSIS — N390 Urinary tract infection, site not specified: Secondary | ICD-10-CM | POA: Diagnosis not present

## 2015-04-06 DIAGNOSIS — S92352D Displaced fracture of fifth metatarsal bone, left foot, subsequent encounter for fracture with routine healing: Secondary | ICD-10-CM | POA: Diagnosis not present

## 2015-04-06 DIAGNOSIS — C3411 Malignant neoplasm of upper lobe, right bronchus or lung: Secondary | ICD-10-CM | POA: Diagnosis not present

## 2015-04-06 DIAGNOSIS — C3412 Malignant neoplasm of upper lobe, left bronchus or lung: Secondary | ICD-10-CM | POA: Diagnosis not present

## 2015-04-06 DIAGNOSIS — J449 Chronic obstructive pulmonary disease, unspecified: Secondary | ICD-10-CM | POA: Diagnosis not present

## 2015-04-06 DIAGNOSIS — A419 Sepsis, unspecified organism: Secondary | ICD-10-CM | POA: Diagnosis not present

## 2015-04-07 ENCOUNTER — Telehealth (INDEPENDENT_AMBULATORY_CARE_PROVIDER_SITE_OTHER): Payer: Self-pay | Admitting: *Deleted

## 2015-04-07 DIAGNOSIS — Z681 Body mass index (BMI) 19 or less, adult: Secondary | ICD-10-CM | POA: Diagnosis not present

## 2015-04-07 DIAGNOSIS — N289 Disorder of kidney and ureter, unspecified: Secondary | ICD-10-CM | POA: Diagnosis not present

## 2015-04-07 DIAGNOSIS — Z1389 Encounter for screening for other disorder: Secondary | ICD-10-CM | POA: Diagnosis not present

## 2015-04-07 DIAGNOSIS — R197 Diarrhea, unspecified: Secondary | ICD-10-CM | POA: Diagnosis not present

## 2015-04-07 DIAGNOSIS — N390 Urinary tract infection, site not specified: Secondary | ICD-10-CM | POA: Diagnosis not present

## 2015-04-07 NOTE — Telephone Encounter (Signed)
Follow up with Rogene Houston, MD.    Specialty: Gastroenterology   Why: office will contact you for appointment   Contact information:   [place], [place] [place] [address] [phone]       Please call patient to arrange an appt please

## 2015-04-08 DIAGNOSIS — J449 Chronic obstructive pulmonary disease, unspecified: Secondary | ICD-10-CM | POA: Diagnosis not present

## 2015-04-08 DIAGNOSIS — C3412 Malignant neoplasm of upper lobe, left bronchus or lung: Secondary | ICD-10-CM | POA: Diagnosis not present

## 2015-04-08 DIAGNOSIS — N390 Urinary tract infection, site not specified: Secondary | ICD-10-CM | POA: Diagnosis not present

## 2015-04-08 DIAGNOSIS — K922 Gastrointestinal hemorrhage, unspecified: Secondary | ICD-10-CM | POA: Diagnosis not present

## 2015-04-08 DIAGNOSIS — S92352D Displaced fracture of fifth metatarsal bone, left foot, subsequent encounter for fracture with routine healing: Secondary | ICD-10-CM | POA: Diagnosis not present

## 2015-04-08 DIAGNOSIS — A419 Sepsis, unspecified organism: Secondary | ICD-10-CM | POA: Diagnosis not present

## 2015-04-08 DIAGNOSIS — C3411 Malignant neoplasm of upper lobe, right bronchus or lung: Secondary | ICD-10-CM | POA: Diagnosis not present

## 2015-04-09 ENCOUNTER — Encounter (INDEPENDENT_AMBULATORY_CARE_PROVIDER_SITE_OTHER): Payer: Self-pay | Admitting: Internal Medicine

## 2015-04-09 NOTE — Telephone Encounter (Signed)
[name] is scheduled on April 4th, 2017 at 9:00am. Thank you.

## 2015-04-10 DIAGNOSIS — S92352D Displaced fracture of fifth metatarsal bone, left foot, subsequent encounter for fracture with routine healing: Secondary | ICD-10-CM | POA: Diagnosis not present

## 2015-04-10 DIAGNOSIS — A419 Sepsis, unspecified organism: Secondary | ICD-10-CM | POA: Diagnosis not present

## 2015-04-10 DIAGNOSIS — C3411 Malignant neoplasm of upper lobe, right bronchus or lung: Secondary | ICD-10-CM | POA: Diagnosis not present

## 2015-04-10 DIAGNOSIS — J449 Chronic obstructive pulmonary disease, unspecified: Secondary | ICD-10-CM | POA: Diagnosis not present

## 2015-04-10 DIAGNOSIS — N390 Urinary tract infection, site not specified: Secondary | ICD-10-CM | POA: Diagnosis not present

## 2015-04-10 DIAGNOSIS — C3412 Malignant neoplasm of upper lobe, left bronchus or lung: Secondary | ICD-10-CM | POA: Diagnosis not present

## 2015-04-13 ENCOUNTER — Telehealth: Payer: Self-pay | Admitting: Orthopedic Surgery

## 2015-04-13 DIAGNOSIS — C3411 Malignant neoplasm of upper lobe, right bronchus or lung: Secondary | ICD-10-CM | POA: Diagnosis not present

## 2015-04-13 DIAGNOSIS — N390 Urinary tract infection, site not specified: Secondary | ICD-10-CM | POA: Diagnosis not present

## 2015-04-13 DIAGNOSIS — A419 Sepsis, unspecified organism: Secondary | ICD-10-CM | POA: Diagnosis not present

## 2015-04-13 DIAGNOSIS — J449 Chronic obstructive pulmonary disease, unspecified: Secondary | ICD-10-CM | POA: Diagnosis not present

## 2015-04-13 DIAGNOSIS — C3412 Malignant neoplasm of upper lobe, left bronchus or lung: Secondary | ICD-10-CM | POA: Diagnosis not present

## 2015-04-13 DIAGNOSIS — S92352D Displaced fracture of fifth metatarsal bone, left foot, subsequent encounter for fracture with routine healing: Secondary | ICD-10-CM | POA: Diagnosis not present

## 2015-04-13 NOTE — Telephone Encounter (Signed)
ROUTING TO DR HARRISON FOR REVIEW 

## 2015-04-13 NOTE — Telephone Encounter (Signed)
Patient in CAM boot since mid-February.  Wears 24/7.  Can she remove for bathing.  Patient has no FU appt. scheduled since leaving hospital ([facility]).  Please call.

## 2015-04-13 NOTE — Telephone Encounter (Signed)
Yes  Schedule fu in 6 weeks

## 2015-04-14 NOTE — Telephone Encounter (Signed)
Please advise and schedule follow up, Thanks

## 2015-04-14 NOTE — Telephone Encounter (Signed)
Patient has been contacted and advised, and also scheduled for follow up appointment per Dr Ruthe Mannan response.

## 2015-04-15 DIAGNOSIS — Z681 Body mass index (BMI) 19 or less, adult: Secondary | ICD-10-CM | POA: Diagnosis not present

## 2015-04-15 DIAGNOSIS — Z0000 Encounter for general adult medical examination without abnormal findings: Secondary | ICD-10-CM | POA: Diagnosis not present

## 2015-04-15 DIAGNOSIS — E1140 Type 2 diabetes mellitus with diabetic neuropathy, unspecified: Secondary | ICD-10-CM | POA: Diagnosis not present

## 2015-04-15 DIAGNOSIS — Z1389 Encounter for screening for other disorder: Secondary | ICD-10-CM | POA: Diagnosis not present

## 2015-04-15 DIAGNOSIS — D649 Anemia, unspecified: Secondary | ICD-10-CM | POA: Diagnosis not present

## 2015-04-15 DIAGNOSIS — N289 Disorder of kidney and ureter, unspecified: Secondary | ICD-10-CM | POA: Diagnosis not present

## 2015-04-16 ENCOUNTER — Ambulatory Visit: Payer: Medicare Other | Admitting: Orthopedic Surgery

## 2015-04-17 DIAGNOSIS — C3411 Malignant neoplasm of upper lobe, right bronchus or lung: Secondary | ICD-10-CM | POA: Diagnosis not present

## 2015-04-17 DIAGNOSIS — N390 Urinary tract infection, site not specified: Secondary | ICD-10-CM | POA: Diagnosis not present

## 2015-04-17 DIAGNOSIS — C3412 Malignant neoplasm of upper lobe, left bronchus or lung: Secondary | ICD-10-CM | POA: Diagnosis not present

## 2015-04-17 DIAGNOSIS — S92352D Displaced fracture of fifth metatarsal bone, left foot, subsequent encounter for fracture with routine healing: Secondary | ICD-10-CM | POA: Diagnosis not present

## 2015-04-17 DIAGNOSIS — A419 Sepsis, unspecified organism: Secondary | ICD-10-CM | POA: Diagnosis not present

## 2015-04-17 DIAGNOSIS — J449 Chronic obstructive pulmonary disease, unspecified: Secondary | ICD-10-CM | POA: Diagnosis not present

## 2015-04-21 ENCOUNTER — Encounter (INDEPENDENT_AMBULATORY_CARE_PROVIDER_SITE_OTHER): Payer: Self-pay | Admitting: *Deleted

## 2015-04-21 ENCOUNTER — Telehealth (INDEPENDENT_AMBULATORY_CARE_PROVIDER_SITE_OTHER): Payer: Self-pay | Admitting: Internal Medicine

## 2015-04-21 ENCOUNTER — Ambulatory Visit (INDEPENDENT_AMBULATORY_CARE_PROVIDER_SITE_OTHER): Payer: Medicare Other | Admitting: Internal Medicine

## 2015-04-21 ENCOUNTER — Other Ambulatory Visit (INDEPENDENT_AMBULATORY_CARE_PROVIDER_SITE_OTHER): Payer: Self-pay | Admitting: Internal Medicine

## 2015-04-21 ENCOUNTER — Encounter (INDEPENDENT_AMBULATORY_CARE_PROVIDER_SITE_OTHER): Payer: Self-pay | Admitting: Internal Medicine

## 2015-04-21 ENCOUNTER — Other Ambulatory Visit (INDEPENDENT_AMBULATORY_CARE_PROVIDER_SITE_OTHER): Payer: Self-pay | Admitting: *Deleted

## 2015-04-21 VITALS — BP 140/58 | HR 80 | Temp 97.5°F | Ht 63.5 in | Wt 145.3 lb

## 2015-04-21 DIAGNOSIS — K5909 Other constipation: Secondary | ICD-10-CM | POA: Diagnosis not present

## 2015-04-21 DIAGNOSIS — K625 Hemorrhage of anus and rectum: Secondary | ICD-10-CM

## 2015-04-21 MED ORDER — POLYETHYLENE GLYCOL 3350 17 GM/SCOOP PO POWD: 1.0000 | Freq: Every day | ORAL | Status: DC

## 2015-04-21 NOTE — Telephone Encounter (Signed)
Needs trilyte

## 2015-04-21 NOTE — Patient Instructions (Addendum)
Colonoscopy. The risks and benefits such as perforation, bleeding, and infection were reviewed with the patient and is agreeable. Mineral oil enema today.

## 2015-04-21 NOTE — Progress Notes (Addendum)
Subjective:    Patient ID: [name], female    DOB: 1940-05-21, 75 y.o.   MRN: [id number]  HPI Referred by Hospitalist services for rectal bleeding. Admitted to [facility] last months with c/o vomiting and diarrhea. She was going multiple times.  She thinks she may have had a fever.  Patient gave hx of her stools being bloody. She was guaiac positive in the ED.  She had generalized abdominal pain. GI pathogen and C-diff were negative. She also suffered a fx foot same day and was evaluated by Dr. Aline Brochure.  She was treated for a UTI. Her vomiting and diarrhea resolved and felt to be viral in nature. Blood cultures were negative. MRSA negative.  She tells me today she is not having any diarrhea. She says she is constipated. She says she is having one BM a week. Her last BM was this am. Her normal bowel habits are one BM a day.  She is not on a stool softener. She has not seen any blood since her admission.  Her appetite is fair.  She has about 13 pound weight loss since her hospital stay.  She says she doesn't have any energy. She has some nausea.  Hx of lung cancer. She underwent radiation therapy and finished last month.  but no chemo therapy.  Her last colonoscopy was in 2008 by Dr. Gala Romney for screening.: Normal rectum. Left sided diverticula. Remander of colonic mucosa appeared normal.    03/30/2015 CT Abdomen/pelvis with CM:      CLINICAL DATA: Acute onset of diarrhea and nausea. Elevated creatinine. Initial encounter.   IMPRESSION: 1. No acute abnormality seen to explain the patient's symptoms. 2. Diffuse calcification along the abdominal aorta and its branches. Mild to moderate luminal narrowing suggested at the infrarenal abdominal aorta, given the pattern of calcification. There is likely at least moderate luminal narrowing along the common iliac arteries bilaterally. 3. Scattered coronary artery calcifications seen. Calcification noted at the mitral valve. 4. Left renal cyst  noted. 5. Mild degenerative change along the lower thoracic and lumbar spine, with mild chronic loss of height at T11.    CBC    Component Value Date/Time   WBC 3.6* 04/02/2015 0900   RBC 3.06* 04/02/2015 0900   HGB 9.4* 04/02/2015 0900   HCT 29.9* 04/02/2015 0900   PLT 53* 04/02/2015 0900   MCV 97.7 04/02/2015 0900   MCH 30.7 04/02/2015 0900   MCHC 31.4 04/02/2015 0900   RDW 16.7* 04/02/2015 0900   LYMPHSABS 0.9 03/30/2015 2148   MONOABS 3.5* 03/30/2015 2148   EOSABS 0.0 03/30/2015 2148   BASOSABS 0.0 03/30/2015 2148       Review of Systems Past Medical History  Diagnosis Date  . Diabetes mellitus     x 20 yrs  . Lupus ([place])     sle  . Fibromyalgia   . Thrombocytopenia, unspecified ([place]) 09/04/2013  . Lung cancer ([place]) 01/28/2015    left upper lobe lung /right upper lobe lung  . Fibromyalgia   . RA (rheumatoid arthritis) ([place])     ra  . Tobacco use   . Peripheral neuropathy [facility])     Past Surgical History  Procedure Laterality Date  . Appendectomy    . Cesarean section  x3  . Oophorectomy    . Cataract extraction w/ intraocular lens implant  2010    right eye  . Abdominal hysterectomy    . Left arm surgery      multiple surgeries on  .  Trigger finger release  yrs ago    right hand  . Knee arthroscopy  yrs ago    left knee  . Shoulder hemi-arthroplasty  01/27/2011    Procedure: SHOULDER HEMI-ARTHROPLASTY;  Surgeon: Nita Sells, MD;  Location: [facility];  Service: Orthopedics;  Laterality: Right;  interscaline right shoulder    Allergies  Allergen Reactions  . Bupropion Nausea Only  . Lopid  [Gemfibrozil] Nausea Only    Current Outpatient Prescriptions on File Prior to Visit  Medication Sig Dispense Refill  . albuterol (PROVENTIL HFA;VENTOLIN HFA) 108 (90 BASE) MCG/ACT inhaler Inhale 2 puffs into the lungs every 6 (six) hours as needed for wheezing or shortness of breath. Please instruct in usage. 1 Inhaler 0  . alendronate (FOSAMAX) 70 MG  tablet Take 1 tablet by mouth Once a week. On Sunday    . aspirin EC 81 MG tablet Take 81 mg by mouth daily.    . cholecalciferol (VITAMIN D) 1000 UNITS tablet Take 1,000 Units by mouth daily.     . folic acid (FOLVITE) 1 MG tablet Take 1 mg by mouth daily.    . methotrexate 2.5 MG tablet Take 7.5 mg by mouth 2 (two) times a week.    . metoprolol tartrate (LOPRESSOR) 25 MG tablet Take 25 mg by mouth daily.     . Multiple Vitamins-Minerals (PRESERVISION AREDS 2) CAPS Take 2 capsules by mouth 2 (two) times daily.    Marland Kitchen oxyCODONE-acetaminophen (PERCOCET/ROXICET) 5-325 MG tablet Take 1 tablet by mouth every 6 (six) hours as needed for moderate pain. 30 tablet 0  . pantoprazole (PROTONIX) 40 MG tablet Take 1 tablet (40 mg total) by mouth daily before breakfast. 30 tablet 0  . tetrahydrozoline-zinc (VISINE-AC) 0.05-0.25 % ophthalmic solution Place 2 drops into both eyes daily as needed (dry eyes).    . cefUROXime (CEFTIN) 500 MG tablet Take 1 tablet (500 mg total) by mouth 2 (two) times daily with a meal. (Patient not taking: Reported on 04/21/2015) 9 tablet 0   Current Facility-Administered Medications on File Prior to Visit  Medication Dose Route Frequency Provider Last Rate Last Dose  . 0.9 %  sodium chloride infusion   Intravenous Continuous Hurley Cisco, MD 20 mL/hr at 10/04/10 1339          Objective:   Physical ExamBlood pressure 140/58, pulse 80, temperature 97.5 F (36.4 C), height 5' 3.5" (1.613 m), weight 145 lb 4.8 oz (65.908 kg). Alert and oriented. Skin warm and dry. Oral mucosa is moist.   . Sclera anicteric, conjunctivae is pink. Thyroid not enlarged. No cervical lymphadenopathy. Lungs clear. Heart regular rate and rhythm.  Abdomen is soft. Bowel sounds are positive. No hepatomegaly. No abdominal masses felt. No tenderness.  No edema to lower extremities.  Large amt of stool in rectum. and guaiac negative.    Lot [id number] Ex 9/17     Assessment & Plan:  Episode of rectal  bleeding resolved. ? Diverticular in nature. Last colonoscopy in 2008 by Dr. Gala Romney. Needs surveillance. Colonoscopy. The risks and benefits such as perforation, bleeding, and infection were reviewed with the patient and is agreeable. CBC.  Constipation: Miralax 1 scoop daily. Go to drug store and get a Mineral oil enema.Marland Kitchen

## 2015-04-22 DIAGNOSIS — J449 Chronic obstructive pulmonary disease, unspecified: Secondary | ICD-10-CM | POA: Diagnosis not present

## 2015-04-22 DIAGNOSIS — C3412 Malignant neoplasm of upper lobe, left bronchus or lung: Secondary | ICD-10-CM | POA: Diagnosis not present

## 2015-04-22 DIAGNOSIS — C3411 Malignant neoplasm of upper lobe, right bronchus or lung: Secondary | ICD-10-CM | POA: Diagnosis not present

## 2015-04-22 DIAGNOSIS — S92352D Displaced fracture of fifth metatarsal bone, left foot, subsequent encounter for fracture with routine healing: Secondary | ICD-10-CM | POA: Diagnosis not present

## 2015-04-22 DIAGNOSIS — A419 Sepsis, unspecified organism: Secondary | ICD-10-CM | POA: Diagnosis not present

## 2015-04-22 DIAGNOSIS — N390 Urinary tract infection, site not specified: Secondary | ICD-10-CM | POA: Diagnosis not present

## 2015-04-22 LAB — COMPREHENSIVE METABOLIC PANEL
ALK PHOS: 99 U/L (ref 33–130)
ALT: 10 U/L (ref 6–29)
AST: 17 U/L (ref 10–35)
Albumin: 3.9 g/dL (ref 3.6–5.1)
BILIRUBIN TOTAL: 0.6 mg/dL (ref 0.2–1.2)
BUN: 20 mg/dL (ref 7–25)
CO2: 23 mmol/L (ref 20–31)
CREATININE: 0.76 mg/dL (ref 0.60–0.93)
Calcium: 9.3 mg/dL (ref 8.6–10.4)
Chloride: 98 mmol/L (ref 98–110)
GLUCOSE: 196 mg/dL — AB (ref 65–99)
Potassium: 4.4 mmol/L (ref 3.5–5.3)
Sodium: 134 mmol/L — ABNORMAL LOW (ref 135–146)
TOTAL PROTEIN: 7.5 g/dL (ref 6.1–8.1)

## 2015-04-22 LAB — CBC WITH DIFFERENTIAL/PLATELET
BASOS ABS: 0 {cells}/uL (ref 0–200)
Basophils Relative: 0 %
EOS ABS: 0 {cells}/uL — AB (ref 15–500)
Eosinophils Relative: 0 %
HCT: 35.8 % (ref 35.0–45.0)
Hemoglobin: 11.2 g/dL — ABNORMAL LOW (ref 11.7–15.5)
LYMPHS PCT: 18 %
Lymphs Abs: 1170 cells/uL (ref 850–3900)
MCH: 29.1 pg (ref 27.0–33.0)
MCHC: 31.3 g/dL — ABNORMAL LOW (ref 32.0–36.0)
MCV: 93 fL (ref 80.0–100.0)
MONO ABS: 1235 {cells}/uL — AB (ref 200–950)
MONOS PCT: 19 %
MPV: 10.2 fL (ref 7.5–12.5)
NEUTROS ABS: 4095 {cells}/uL (ref 1500–7800)
Neutrophils Relative %: 63 %
PLATELETS: 115 10*3/uL — AB (ref 140–400)
RBC: 3.85 MIL/uL (ref 3.80–5.10)
RDW: 16.6 % — AB (ref 11.0–15.0)
WBC: 6.5 10*3/uL (ref 3.8–10.8)

## 2015-04-22 MED ORDER — PEG 3350-KCL-NA BICARB-NACL 420 G PO SOLR: 4000.0000 mL | Freq: Once | ORAL | Status: DC

## 2015-04-24 ENCOUNTER — Encounter: Payer: Self-pay | Admitting: Orthopedic Surgery

## 2015-04-24 ENCOUNTER — Ambulatory Visit (INDEPENDENT_AMBULATORY_CARE_PROVIDER_SITE_OTHER): Payer: Medicare Other | Admitting: Orthopedic Surgery

## 2015-04-24 ENCOUNTER — Ambulatory Visit (INDEPENDENT_AMBULATORY_CARE_PROVIDER_SITE_OTHER): Payer: Medicare Other

## 2015-04-24 VITALS — BP 107/60 | Ht 63.5 in | Wt 145.0 lb

## 2015-04-24 DIAGNOSIS — A419 Sepsis, unspecified organism: Secondary | ICD-10-CM | POA: Diagnosis not present

## 2015-04-24 DIAGNOSIS — C3412 Malignant neoplasm of upper lobe, left bronchus or lung: Secondary | ICD-10-CM | POA: Diagnosis not present

## 2015-04-24 DIAGNOSIS — S92901D Unspecified fracture of right foot, subsequent encounter for fracture with routine healing: Secondary | ICD-10-CM

## 2015-04-24 DIAGNOSIS — S92301D Fracture of unspecified metatarsal bone(s), right foot, subsequent encounter for fracture with routine healing: Secondary | ICD-10-CM | POA: Diagnosis not present

## 2015-04-24 DIAGNOSIS — C3411 Malignant neoplasm of upper lobe, right bronchus or lung: Secondary | ICD-10-CM | POA: Diagnosis not present

## 2015-04-24 DIAGNOSIS — N390 Urinary tract infection, site not specified: Secondary | ICD-10-CM | POA: Diagnosis not present

## 2015-04-24 DIAGNOSIS — S92352D Displaced fracture of fifth metatarsal bone, left foot, subsequent encounter for fracture with routine healing: Secondary | ICD-10-CM | POA: Diagnosis not present

## 2015-04-24 DIAGNOSIS — J449 Chronic obstructive pulmonary disease, unspecified: Secondary | ICD-10-CM | POA: Diagnosis not present

## 2015-04-24 NOTE — Progress Notes (Signed)
Consult follow-up fracture multiple metatarsal fractures right foot patient complains of foot pain and that the [place] is bothering her she had previous casting and another injury and did not tolerate the casting well because of pain and pressure over the distal tibia  Her foot looks plantigrade she's neurovascularly intact she has good color capillary refill can move her toes she has tenderness over the multiple fractures in the foot  Today's x-rays show the fractures are healing Lisfranc joint looks intact  Recommend short leg walking cast and come back 5 weeks x-rays out of plaster  Cast she was also given to assist in weightbearing

## 2015-04-27 DIAGNOSIS — N390 Urinary tract infection, site not specified: Secondary | ICD-10-CM | POA: Diagnosis not present

## 2015-04-27 DIAGNOSIS — A419 Sepsis, unspecified organism: Secondary | ICD-10-CM | POA: Diagnosis not present

## 2015-04-27 DIAGNOSIS — C3412 Malignant neoplasm of upper lobe, left bronchus or lung: Secondary | ICD-10-CM | POA: Diagnosis not present

## 2015-04-27 DIAGNOSIS — S92352D Displaced fracture of fifth metatarsal bone, left foot, subsequent encounter for fracture with routine healing: Secondary | ICD-10-CM | POA: Diagnosis not present

## 2015-04-27 DIAGNOSIS — J449 Chronic obstructive pulmonary disease, unspecified: Secondary | ICD-10-CM | POA: Diagnosis not present

## 2015-04-27 DIAGNOSIS — C3411 Malignant neoplasm of upper lobe, right bronchus or lung: Secondary | ICD-10-CM | POA: Diagnosis not present

## 2015-04-27 NOTE — Progress Notes (Signed)
°  Radiation Oncology         [phone] ________________________________  Name: [name] MRN: [id number]  Date: 03/06/2015  DOB: 11/18/1940  End of Treatment Note  Diagnosis: Malignant neoplasm of upper lobe of left lung     Indication for treatment:  Curative       Radiation treatment dates:   03/06/15  Site/dose: Right upper lung treated to 54 Gy in 3 fractions, Left upper lung treated to 50 Gy in 5 fractions  Beams/energy:   Right lung: SBRT/ SRT- 3D / 6FFF single field, Left lung: SBRT/SRT-VMAT/ 6FFF 2 fields  Narrative: The patient tolerated radiation treatment relatively well.     Plan: The patient has completed radiation treatment. The patient will return to radiation oncology clinic for routine followup in one month. I advised them to call or return sooner if they have any questions or concerns related to their recovery or treatment.  ------------------------------------------------  Jodelle Gross, MD, PhD  This document serves as a record of services personally performed by Kyung Rudd, MD. It was created on his behalf by Derek Mound, a trained medical scribe. The creation of this record is based on the scribe's personal observations and the provider's statements to them. This document has been checked and approved by the attending provider.

## 2015-04-28 NOTE — Telephone Encounter (Signed)
Patient came by office

## 2015-04-29 DIAGNOSIS — R911 Solitary pulmonary nodule: Secondary | ICD-10-CM | POA: Diagnosis not present

## 2015-04-29 DIAGNOSIS — J438 Other emphysema: Secondary | ICD-10-CM | POA: Diagnosis not present

## 2015-04-29 DIAGNOSIS — Z716 Tobacco abuse counseling: Secondary | ICD-10-CM | POA: Diagnosis not present

## 2015-04-29 DIAGNOSIS — R918 Other nonspecific abnormal finding of lung field: Secondary | ICD-10-CM | POA: Diagnosis not present

## 2015-04-29 DIAGNOSIS — C3412 Malignant neoplasm of upper lobe, left bronchus or lung: Secondary | ICD-10-CM | POA: Diagnosis not present

## 2015-04-29 NOTE — Addendum Note (Signed)
Encounter addended by: Kyung Rudd, MD on: 04/29/2015  5:06 PM<BR>     Documentation filed: Notes Section

## 2015-04-29 NOTE — Progress Notes (Signed)
  Radiation Oncology         [phone] ________________________________  Name: [name] MRN: [id number]  Date: 02/23/2015  DOB: [birth year]  RESPIRATORY MOTION MANAGEMENT SIMULATION  NARRATIVE:  In order to account for effect of respiratory motion on target structures and other organs in the planning and delivery of radiotherapy, this patient underwent respiratory motion management simulation.  To accomplish this, when the patient was brought to the CT simulation planning suite, 4D respiratoy motion management CT images were obtained.  The CT images were loaded into the planning software.  Then, using a variety of tools including Cine, MIP, and standard views, the target volume and planning target volumes (PTV) were delineated.  Avoidance structures were contoured.  Treatment planning then occurred.  Dose volume histograms were generated and reviewed for each of the requested structure.  The resulting plan was carefully reviewed and approved today.   ------------------------------------------------  Jodelle Gross, MD, PhD

## 2015-04-29 NOTE — Addendum Note (Signed)
Encounter addended by: Kyung Rudd, MD on: 04/29/2015  5:04 PM<BR>     Documentation filed: Notes Section, Visit Diagnoses

## 2015-04-29 NOTE — Progress Notes (Signed)
[facility] Radiation Oncology Simulation and Treatment Planning Note   Name:  [name] MRN: [id number]   Date: 04/29/2015  DOB: [birth year]  Status:outpatient    DIAGNOSIS:    ICD-9-CM ICD-10-CM   1. Malignant neoplasm of upper lobe of right lung (HCC) 162.3 C34.11       CONSENT VERIFIED:yes   SET UP: Patient is setup supine   IMMOBILIZATION: The patient was immobilized using a Vac Loc bag and Abdominal Compression.   NARRATIVE:The patient was brought to the [place] [place].  Identity was confirmed.  All relevant records and images related to the planned course of therapy were reviewed.  Then, the patient was positioned in a stable reproducible clinical set-up for radiation therapy. Abdominal compression was applied by me.  4D CT images were obtained and reproducible breathing pattern was confirmed. Free breathing CT images were obtained.  Skin markings were placed.  The CT images were loaded into the planning software where the target and avoidance structures were contoured.  The radiation prescription was entered and confirmed.    TREATMENT PLANNING NOTE:  Treatment planning then occurred. I have requested : MLC's, isodose plan, basic dose calculation.  3 dimensional simulation is performed and dose volume histogram of the gross tumor volume, planning tumor volume and criticial normal structures including the spinal cord and lungs were analyzed and requested.  Special treatment procedure was performed due to high dose per fraction.  The patient will be monitored for increased risk of toxicity.  Daily imaging using cone beam CT will be used for target localization.  The patient will receive 54 gray in 3 fractions to the right lung tumor and 50 gray in 5 fractions to the left lung tumor.  ------------------------------------------------  Jodelle Gross, MD, PhD

## 2015-04-30 ENCOUNTER — Inpatient Hospital Stay (HOSPITAL_COMMUNITY)
Admission: EM | Admit: 2015-04-30 | Discharge: 2015-05-05 | DRG: 872 | Disposition: A | Discharge status: Home / Self Care | Payer: Medicare Other | Attending: Family Medicine | Admitting: Family Medicine

## 2015-04-30 ENCOUNTER — Emergency Department (HOSPITAL_COMMUNITY): Payer: Medicare Other

## 2015-04-30 ENCOUNTER — Encounter (HOSPITAL_COMMUNITY): Payer: Self-pay

## 2015-04-30 DIAGNOSIS — S92352D Displaced fracture of fifth metatarsal bone, left foot, subsequent encounter for fracture with routine healing: Secondary | ICD-10-CM | POA: Diagnosis not present

## 2015-04-30 DIAGNOSIS — K625 Hemorrhage of anus and rectum: Secondary | ICD-10-CM | POA: Insufficient documentation

## 2015-04-30 DIAGNOSIS — D696 Thrombocytopenia, unspecified: Secondary | ICD-10-CM | POA: Diagnosis present

## 2015-04-30 DIAGNOSIS — K5730 Diverticulosis of large intestine without perforation or abscess without bleeding: Secondary | ICD-10-CM | POA: Diagnosis present

## 2015-04-30 DIAGNOSIS — R109 Unspecified abdominal pain: Secondary | ICD-10-CM | POA: Diagnosis not present

## 2015-04-30 DIAGNOSIS — E119 Type 2 diabetes mellitus without complications: Secondary | ICD-10-CM

## 2015-04-30 DIAGNOSIS — E1142 Type 2 diabetes mellitus with diabetic polyneuropathy: Secondary | ICD-10-CM | POA: Diagnosis present

## 2015-04-30 DIAGNOSIS — Z66 Do not resuscitate: Secondary | ICD-10-CM | POA: Diagnosis present

## 2015-04-30 DIAGNOSIS — R55 Syncope and collapse: Secondary | ICD-10-CM | POA: Diagnosis not present

## 2015-04-30 DIAGNOSIS — K921 Melena: Secondary | ICD-10-CM | POA: Diagnosis not present

## 2015-04-30 DIAGNOSIS — D693 Immune thrombocytopenic purpura: Secondary | ICD-10-CM | POA: Diagnosis present

## 2015-04-30 DIAGNOSIS — C3412 Malignant neoplasm of upper lobe, left bronchus or lung: Secondary | ICD-10-CM | POA: Diagnosis not present

## 2015-04-30 DIAGNOSIS — M797 Fibromyalgia: Secondary | ICD-10-CM | POA: Diagnosis present

## 2015-04-30 DIAGNOSIS — R197 Diarrhea, unspecified: Secondary | ICD-10-CM | POA: Diagnosis not present

## 2015-04-30 DIAGNOSIS — D125 Benign neoplasm of sigmoid colon: Secondary | ICD-10-CM | POA: Diagnosis present

## 2015-04-30 DIAGNOSIS — I10 Essential (primary) hypertension: Secondary | ICD-10-CM | POA: Diagnosis present

## 2015-04-30 DIAGNOSIS — K644 Residual hemorrhoidal skin tags: Secondary | ICD-10-CM | POA: Diagnosis present

## 2015-04-30 DIAGNOSIS — M329 Systemic lupus erythematosus, unspecified: Secondary | ICD-10-CM | POA: Diagnosis present

## 2015-04-30 DIAGNOSIS — Z85118 Personal history of other malignant neoplasm of bronchus and lung: Secondary | ICD-10-CM

## 2015-04-30 DIAGNOSIS — A419 Sepsis, unspecified organism: Principal | ICD-10-CM | POA: Diagnosis present

## 2015-04-30 DIAGNOSIS — K529 Noninfective gastroenteritis and colitis, unspecified: Secondary | ICD-10-CM | POA: Diagnosis not present

## 2015-04-30 DIAGNOSIS — C3411 Malignant neoplasm of upper lobe, right bronchus or lung: Secondary | ICD-10-CM | POA: Diagnosis not present

## 2015-04-30 DIAGNOSIS — E860 Dehydration: Secondary | ICD-10-CM | POA: Diagnosis present

## 2015-04-30 DIAGNOSIS — F17210 Nicotine dependence, cigarettes, uncomplicated: Secondary | ICD-10-CM | POA: Diagnosis present

## 2015-04-30 DIAGNOSIS — D638 Anemia in other chronic diseases classified elsewhere: Secondary | ICD-10-CM | POA: Diagnosis present

## 2015-04-30 DIAGNOSIS — M069 Rheumatoid arthritis, unspecified: Secondary | ICD-10-CM | POA: Diagnosis present

## 2015-04-30 DIAGNOSIS — K648 Other hemorrhoids: Secondary | ICD-10-CM | POA: Diagnosis present

## 2015-04-30 DIAGNOSIS — Z923 Personal history of irradiation: Secondary | ICD-10-CM | POA: Diagnosis not present

## 2015-04-30 DIAGNOSIS — N179 Acute kidney failure, unspecified: Secondary | ICD-10-CM | POA: Diagnosis present

## 2015-04-30 DIAGNOSIS — A09 Infectious gastroenteritis and colitis, unspecified: Secondary | ICD-10-CM | POA: Diagnosis present

## 2015-04-30 DIAGNOSIS — R404 Transient alteration of awareness: Secondary | ICD-10-CM | POA: Diagnosis not present

## 2015-04-30 DIAGNOSIS — J449 Chronic obstructive pulmonary disease, unspecified: Secondary | ICD-10-CM | POA: Diagnosis present

## 2015-04-30 DIAGNOSIS — N390 Urinary tract infection, site not specified: Secondary | ICD-10-CM | POA: Diagnosis not present

## 2015-04-30 LAB — CBC WITH DIFFERENTIAL/PLATELET
Band Neutrophils: 0 %
Basophils Absolute: 0 10*3/uL (ref 0.0–0.1)
Basophils Relative: 0 %
Blasts: 0 %
EOS ABS: 0 10*3/uL (ref 0.0–0.7)
Eosinophils Relative: 0 %
HCT: 44.9 % (ref 36.0–46.0)
HEMOGLOBIN: 14 g/dL (ref 12.0–15.0)
Lymphocytes Relative: 14 %
Lymphs Abs: 5.7 10*3/uL — ABNORMAL HIGH (ref 0.7–4.0)
MCH: 29.7 pg (ref 26.0–34.0)
MCHC: 31.2 g/dL (ref 30.0–36.0)
MCV: 95.3 fL (ref 78.0–100.0)
METAMYELOCYTES PCT: 0 %
MONOS PCT: 7 %
MYELOCYTES: 0 %
Monocytes Absolute: 2.9 10*3/uL — ABNORMAL HIGH (ref 0.1–1.0)
NEUTROS ABS: 32.2 10*3/uL — AB (ref 1.7–7.7)
Neutrophils Relative %: 79 %
PLATELETS: 101 10*3/uL — AB (ref 150–400)
Promyelocytes Absolute: 0 %
RBC: 4.71 MIL/uL (ref 3.87–5.11)
RDW: 17.1 % — ABNORMAL HIGH (ref 11.5–15.5)
SMEAR REVIEW: DECREASED
WBC: 40.8 10*3/uL — ABNORMAL HIGH (ref 4.0–10.5)
nRBC: 0 /100 WBC

## 2015-04-30 LAB — I-STAT CHEM 8, ED
BUN: 18 mg/dL (ref 6–20)
CALCIUM ION: 1.09 mmol/L — AB (ref 1.13–1.30)
Chloride: 103 mmol/L (ref 101–111)
Creatinine, Ser: 1.3 mg/dL — ABNORMAL HIGH (ref 0.44–1.00)
Glucose, Bld: 269 mg/dL — ABNORMAL HIGH (ref 65–99)
HEMATOCRIT: 50 % — AB (ref 36.0–46.0)
HEMOGLOBIN: 17 g/dL — AB (ref 12.0–15.0)
Potassium: 3.9 mmol/L (ref 3.5–5.1)
SODIUM: 141 mmol/L (ref 135–145)
TCO2: 23 mmol/L (ref 0–100)

## 2015-04-30 LAB — C DIFFICILE QUICK SCREEN W PCR REFLEX
C Diff antigen: NEGATIVE
C Diff interpretation: NEGATIVE
C Diff toxin: NEGATIVE

## 2015-04-30 LAB — LACTIC ACID, PLASMA: Lactic Acid, Venous: 3.6 mmol/L (ref 0.5–2.0)

## 2015-04-30 LAB — TYPE AND SCREEN
ABO/RH(D): O POS
ANTIBODY SCREEN: NEGATIVE

## 2015-04-30 LAB — COMPREHENSIVE METABOLIC PANEL
ALK PHOS: 120 U/L (ref 38–126)
ALT: 14 U/L (ref 14–54)
AST: 28 U/L (ref 15–41)
Albumin: 3.8 g/dL (ref 3.5–5.0)
Anion gap: 13 (ref 5–15)
BILIRUBIN TOTAL: 0.9 mg/dL (ref 0.3–1.2)
BUN: 18 mg/dL (ref 6–20)
CALCIUM: 8.7 mg/dL — AB (ref 8.9–10.3)
CO2: 22 mmol/L (ref 22–32)
CREATININE: 1.41 mg/dL — AB (ref 0.44–1.00)
Chloride: 101 mmol/L (ref 101–111)
GFR calc non Af Amer: 36 mL/min — ABNORMAL LOW (ref >60)
GFR, EST AFRICAN AMERICAN: 41 mL/min — AB (ref >60)
Glucose, Bld: 271 mg/dL — ABNORMAL HIGH (ref 65–99)
Potassium: 3.9 mmol/L (ref 3.5–5.1)
Sodium: 136 mmol/L (ref 135–145)
TOTAL PROTEIN: 8.2 g/dL — AB (ref 6.5–8.1)

## 2015-04-30 MED ORDER — SODIUM CHLORIDE 0.9 % IV BOLUS (SEPSIS)
1000.0000 mL | Freq: Once | INTRAVENOUS | Status: AC
  Administered 2015-04-30: 1000 mL via INTRAVENOUS

## 2015-04-30 MED ORDER — IOPAMIDOL (ISOVUE-300) INJECTION 61%
100.0000 mL | Freq: Once | INTRAVENOUS | Status: AC | PRN
  Administered 2015-04-30: 100 mL via INTRAVENOUS

## 2015-04-30 MED ORDER — SODIUM CHLORIDE 0.9 % IV BOLUS (SEPSIS)
2000.0000 mL | Freq: Once | INTRAVENOUS | Status: AC
  Administered 2015-04-30: 2000 mL via INTRAVENOUS

## 2015-04-30 MED ORDER — DIATRIZOATE MEGLUMINE & SODIUM 66-10 % PO SOLN
ORAL | Status: AC
  Administered 2015-04-30: 21:00:00
  Filled 2015-04-30: qty 30

## 2015-04-30 MED ORDER — PANTOPRAZOLE SODIUM 40 MG IV SOLR
40.0000 mg | Freq: Once | INTRAVENOUS | Status: AC
  Administered 2015-04-30: 40 mg via INTRAVENOUS
  Filled 2015-04-30: qty 40

## 2015-04-30 MED ORDER — VANCOMYCIN HCL IN DEXTROSE 1-5 GM/200ML-% IV SOLN: 1000.0000 mg | Freq: Once | INTRAVENOUS | Status: DC

## 2015-04-30 MED ORDER — METRONIDAZOLE IN NACL 5-0.79 MG/ML-% IV SOLN
500.0000 mg | Freq: Once | INTRAVENOUS | Status: AC
Start: 2015-04-30 — End: 2015-04-30
  Administered 2015-04-30: 500 mg via INTRAVENOUS
  Filled 2015-04-30: qty 100

## 2015-04-30 MED ORDER — ONDANSETRON HCL 4 MG/2ML IJ SOLN
4.0000 mg | Freq: Once | INTRAMUSCULAR | Status: AC
  Administered 2015-04-30: 4 mg via INTRAVENOUS
  Filled 2015-04-30: qty 2

## 2015-04-30 MED ORDER — CIPROFLOXACIN IN D5W 400 MG/200ML IV SOLN
400.0000 mg | Freq: Once | INTRAVENOUS | Status: AC
  Administered 2015-04-30: 400 mg via INTRAVENOUS
  Filled 2015-04-30: qty 200

## 2015-04-30 NOTE — ED Provider Notes (Signed)
CSN: [id number]     Arrival date & time 04/30/15  1954 History   By signing my name below, I, [name], attest that this documentation has been prepared under the direction and in the presence of Milton Ferguson, MD. Electronically Signed: Eustaquio Watson, ED Scribe. 04/30/2015. 8:30 PM.   Chief Complaint  Patient presents with  . Loss of Consciousness  . GI Bleeding   Patient is a 75 y.o. female presenting with syncope. The history is provided by the patient. No language interpreter was used.  Loss of Consciousness Episode history:  Unable to specify Most recent episode:  Today Progression:  Resolved Chronicity:  New Witnessed: yes   Associated symptoms: nausea, rectal bleeding and vomiting      HPI Comments: [name] is a 75 y.o. female brought in by ambulance, who presents to the Emergency Department complaining of vomiting and bloody diarrhea that began today. She reports similar episode of rectal bleeding a few weeks ago. Pt also complains of abdominal 'soreness' but denies any pain. Per triage report, pt was also brought in for syncope but she does not mention this at time of exam. Family states that pt was getting up to use the bathroom when her left leg went numb, causing her to fall and pass out. Denies hematemesis or any other associated symptoms.   Past Medical History  Diagnosis Date  . Diabetes mellitus     x 20 yrs  . Lupus ([place])     sle  . Fibromyalgia   . Thrombocytopenia, unspecified ([place]) 09/04/2013  . Lung cancer ([place]) 01/28/2015    left upper lobe lung /right upper lobe lung  . Fibromyalgia   . RA (rheumatoid arthritis) ([place])     ra  . Tobacco use   . Peripheral neuropathy [facility])    Past Surgical History  Procedure Laterality Date  . Appendectomy    . Cesarean section  x3  . Oophorectomy    . Cataract extraction w/ intraocular lens implant  2010    right eye  . Abdominal hysterectomy    . Left arm surgery      multiple surgeries on  . Trigger  finger release  yrs ago    right hand  . Knee arthroscopy  yrs ago    left knee  . Shoulder hemi-arthroplasty  01/27/2011    Procedure: SHOULDER HEMI-ARTHROPLASTY;  Surgeon: Nita Sells, MD;  Location: [facility];  Service: Orthopedics;  Laterality: Right;  interscaline right shoulder   Family History  Problem Relation Age of Onset  . Family history unknown: Yes   Social History  Substance Use Topics  . Smoking status: Current Every Day Smoker -- 1.50 packs/day for 40 years    Types: Cigarettes  . Smokeless tobacco: None     Comment: 2 packs a day x 30 yrs  . Alcohol Use: No   OB History    No data available     Review of Systems  Constitutional: Negative for appetite change and fatigue.  HENT: Negative for congestion, ear discharge and sinus pressure.   Eyes: Negative for discharge.  Respiratory: Negative for cough.   Cardiovascular: Positive for syncope.  Gastrointestinal: Positive for nausea, vomiting and blood in stool.  Genitourinary: Negative for frequency and hematuria.  Musculoskeletal: Negative for back pain.  Skin: Negative for rash.  Neurological: Positive for syncope.  Psychiatric/Behavioral: Negative for hallucinations.   Allergies  Bupropion and Lopid   Home Medications   Prior to Admission medications  Medication Sig Start Date End Date Taking? Authorizing Provider  albuterol (PROVENTIL HFA;VENTOLIN HFA) 108 (90 BASE) MCG/ACT inhaler Inhale 2 puffs into the lungs every 6 (six) hours as needed for wheezing or shortness of breath. Please instruct in usage. 09/06/13   Samuella Cota, MD  alendronate (FOSAMAX) 70 MG tablet Take 1 tablet by mouth Once a week. On Sunday 04/15/11   Historical Provider, MD  aspirin EC 81 MG tablet Take 81 mg by mouth daily.    Historical Provider, MD  cefUROXime (CEFTIN) 500 MG tablet Take 1 tablet (500 mg total) by mouth 2 (two) times daily with a meal. 04/02/15   Samuella Cota, MD  cholecalciferol (VITAMIN D) 1000  UNITS tablet Take 1,000 Units by mouth daily.     Historical Provider, MD  folic acid (FOLVITE) 1 MG tablet Take 1 mg by mouth daily.    Historical Provider, MD  methotrexate 2.5 MG tablet Take 7.5 mg by mouth 2 (two) times a week.    Historical Provider, MD  metoprolol tartrate (LOPRESSOR) 25 MG tablet Take 25 mg by mouth daily.     Historical Provider, MD  Multiple Vitamins-Minerals (PRESERVISION AREDS 2) CAPS Take 2 capsules by mouth 2 (two) times daily.    Historical Provider, MD  oxyCODONE-acetaminophen (PERCOCET/ROXICET) 5-325 MG tablet Take 1 tablet by mouth every 6 (six) hours as needed for moderate pain. 04/02/15   Samuella Cota, MD  pantoprazole (PROTONIX) 40 MG tablet Take 1 tablet (40 mg total) by mouth daily before breakfast. 04/02/15   Samuella Cota, MD  polyethylene glycol-electrolytes (NULYTELY/GOLYTELY) 420 g solution Take 4,000 mLs by mouth once. 04/22/15   Butch Penny, NP  tetrahydrozoline-zinc (VISINE-AC) 0.05-0.25 % ophthalmic solution Place 2 drops into both eyes daily as needed (dry eyes).    Historical Provider, MD   BP 91/48 mmHg  Pulse 125  Temp(Src) 98.4 F (36.9 C) (Oral)  Resp 23  Ht 5' 3.5" (1.613 m)  Wt 150 lb (68.04 kg)  BMI 26.15 kg/m2  SpO2 94%   Physical Exam  Constitutional: She is oriented to person, place, and time. She appears well-developed.  HENT:  Head: Normocephalic.  Eyes: Conjunctivae and EOM are normal. No scleral icterus.  Neck: Neck supple. No thyromegaly present.  Cardiovascular: Normal rate and regular rhythm.  Exam reveals no gallop and no friction rub.   No murmur heard. Pulmonary/Chest: No stridor. She has no wheezes. She has no rales. She exhibits no tenderness.  Abdominal: She exhibits no distension. There is tenderness. There is no rebound.  Mild tenderness around the abdomen  Genitourinary: Guaiac positive stool.  Chaperone present. Bright red blood in the rectum. Guaiac positive.   Musculoskeletal: Normal range of  motion. She exhibits no edema.  Lymphadenopathy:    She has no cervical adenopathy.  Neurological: She is oriented to person, place, and time. She exhibits normal muscle tone. Coordination normal.  Skin: No rash noted. No erythema.  Psychiatric: She has a normal mood and affect. Her behavior is normal.    ED Course  Procedures (including critical care time)  DIAGNOSTIC STUDIES: Oxygen Saturation is 94% on [place], adequate by my interpretation.    COORDINATION OF CARE: 8:17 PM-Discussed treatment plan which includes CBC, CMP, Chem 8 with pt at bedside and pt agreed to plan.   Labs Review Labs Reviewed - No data to display  Imaging Review No results found. I have personally reviewed and evaluated these lab results as part of my medical  decision-making.   EKG Interpretation None     CRITICAL CARE Performed by: Tennis Mckinnon L Total critical care time: 40 minutes Critical care time was exclusive of separately billable procedures and treating other patients. Critical care was necessary to treat or prevent imminent or life-threatening deterioration. Critical care was time spent personally by me on the following activities: development of treatment plan with patient and/or surrogate as well as nursing, discussions with consultants, evaluation of patient's response to treatment, examination of patient, obtaining history from patient or surrogate, ordering and performing treatments and interventions, ordering and review of laboratory studies, ordering and review of radiographic studies, pulse oximetry and re-evaluation of patient's condition.  MDM   Final diagnoses:  None   Patient with rectal bleeding with diarrhea.  Very high white blood count CT of the abdomen shows fluid in nondistended small and large bowel this may represent infectious enteropathy.   Possibly C. difficile. Patient is put on Cipro and Flagyl and will be admitted  The chart was scribed for me under my direct  supervision.  I personally performed the history, physical, and medical decision making and all procedures in the evaluation of this patient.Milton Ferguson, MD 04/30/15 [phone]

## 2015-04-30 NOTE — ED Notes (Signed)
CRITICAL VALUE ALERT  Critical value received:  Lactic Acid 3.6  Date of notification:  04/30/2015  Time of notification:  2220  Critical value read back:Yes.    Nurse who received alert:  Charlies Silvers, RN  MD notified (1st page):  2220  Time of first page:  2220

## 2015-04-30 NOTE — ED Notes (Signed)
Patient had EKG done and given to Dr Roderic Palau.

## 2015-04-30 NOTE — ED Notes (Signed)
Pt in by [facility] for syncope and gi bleeding that started today.  Pt c/o abd pain 5/10

## 2015-05-01 ENCOUNTER — Encounter (HOSPITAL_COMMUNITY): Payer: Self-pay | Admitting: *Deleted

## 2015-05-01 DIAGNOSIS — A419 Sepsis, unspecified organism: Principal | ICD-10-CM

## 2015-05-01 DIAGNOSIS — N179 Acute kidney failure, unspecified: Secondary | ICD-10-CM

## 2015-05-01 DIAGNOSIS — I10 Essential (primary) hypertension: Secondary | ICD-10-CM

## 2015-05-01 DIAGNOSIS — K529 Noninfective gastroenteritis and colitis, unspecified: Secondary | ICD-10-CM

## 2015-05-01 LAB — GASTROINTESTINAL PANEL BY PCR, STOOL (REPLACES STOOL CULTURE)
ADENOVIRUS F40/41: NOT DETECTED
ASTROVIRUS: NOT DETECTED
CAMPYLOBACTER SPECIES: NOT DETECTED
CRYPTOSPORIDIUM: NOT DETECTED
Cyclospora cayetanensis: NOT DETECTED
E. coli O157: NOT DETECTED
ENTEROPATHOGENIC E COLI (EPEC): NOT DETECTED
ENTEROTOXIGENIC E COLI (ETEC): NOT DETECTED
Entamoeba histolytica: NOT DETECTED
Enteroaggregative E coli (EAEC): NOT DETECTED
Giardia lamblia: NOT DETECTED
NOROVIRUS GI/GII: NOT DETECTED
PLESIMONAS SHIGELLOIDES: NOT DETECTED
ROTAVIRUS A: NOT DETECTED
SHIGA LIKE TOXIN PRODUCING E COLI (STEC): NOT DETECTED
Salmonella species: NOT DETECTED
Sapovirus (I, II, IV, and V): NOT DETECTED
Shigella/Enteroinvasive E coli (EIEC): NOT DETECTED
Vibrio cholerae: NOT DETECTED
Vibrio species: NOT DETECTED
Yersinia enterocolitica: NOT DETECTED

## 2015-05-01 LAB — CBC
HCT: 33.9 % — ABNORMAL LOW (ref 36.0–46.0)
Hemoglobin: 10.7 g/dL — ABNORMAL LOW (ref 12.0–15.0)
MCH: 30.1 pg (ref 26.0–34.0)
MCHC: 31.6 g/dL (ref 30.0–36.0)
MCV: 95.2 fL (ref 78.0–100.0)
PLATELETS: 66 10*3/uL — AB (ref 150–400)
RBC: 3.56 MIL/uL — ABNORMAL LOW (ref 3.87–5.11)
RDW: 17.2 % — AB (ref 11.5–15.5)
WBC: 23.7 10*3/uL — AB (ref 4.0–10.5)

## 2015-05-01 LAB — BASIC METABOLIC PANEL
Anion gap: 8 (ref 5–15)
BUN: 15 mg/dL (ref 6–20)
CALCIUM: 7.2 mg/dL — AB (ref 8.9–10.3)
CO2: 20 mmol/L — ABNORMAL LOW (ref 22–32)
Chloride: 111 mmol/L (ref 101–111)
Creatinine, Ser: 1.18 mg/dL — ABNORMAL HIGH (ref 0.44–1.00)
GFR calc Af Amer: 51 mL/min — ABNORMAL LOW (ref >60)
GFR, EST NON AFRICAN AMERICAN: 44 mL/min — AB (ref >60)
GLUCOSE: 186 mg/dL — AB (ref 65–99)
Potassium: 3.9 mmol/L (ref 3.5–5.1)
SODIUM: 139 mmol/L (ref 135–145)

## 2015-05-01 LAB — GLUCOSE, CAPILLARY
GLUCOSE-CAPILLARY: 185 mg/dL — AB (ref 65–99)
GLUCOSE-CAPILLARY: 142 mg/dL — AB (ref 65–99)
GLUCOSE-CAPILLARY: 133 mg/dL — AB (ref 65–99)
Glucose-Capillary: 139 mg/dL — ABNORMAL HIGH (ref 65–99)
Glucose-Capillary: 129 mg/dL — ABNORMAL HIGH (ref 65–99)
Glucose-Capillary: 78 mg/dL (ref 65–99)

## 2015-05-01 LAB — OCCULT BLOOD, POC DEVICE: FECAL OCCULT BLD: POSITIVE — AB

## 2015-05-01 LAB — LACTIC ACID, PLASMA
LACTIC ACID, VENOUS: 2.8 mmol/L — AB (ref 0.5–2.0)
Lactic Acid, Venous: 3.4 mmol/L (ref 0.5–2.0)

## 2015-05-01 LAB — PROCALCITONIN: PROCALCITONIN: 4.6 ng/mL

## 2015-05-01 MED ORDER — ENOXAPARIN SODIUM 40 MG/0.4ML ~~LOC~~ SOLN
40.0000 mg | SUBCUTANEOUS | Status: DC
Start: 2015-05-01 — End: 2015-05-01

## 2015-05-01 MED ORDER — METRONIDAZOLE IN NACL 5-0.79 MG/ML-% IV SOLN
500.0000 mg | Freq: Three times a day (TID) | INTRAVENOUS | Status: DC
  Administered 2015-05-01 – 2015-05-05 (×13): 500 mg via INTRAVENOUS
  Filled 2015-05-01 (×14): qty 100

## 2015-05-01 MED ORDER — PANTOPRAZOLE SODIUM 40 MG PO TBEC
40.0000 mg | DELAYED_RELEASE_TABLET | Freq: Every day | ORAL | Status: DC
  Administered 2015-05-01 – 2015-05-05 (×5): 40 mg via ORAL
  Filled 2015-05-01 (×5): qty 1

## 2015-05-01 MED ORDER — SODIUM CHLORIDE 0.9% FLUSH
3.0000 mL | Freq: Two times a day (BID) | INTRAVENOUS | Status: DC
  Administered 2015-05-01 – 2015-05-04 (×4): 3 mL via INTRAVENOUS

## 2015-05-01 MED ORDER — ONDANSETRON HCL 4 MG PO TABS: 4.0000 mg | ORAL_TABLET | Freq: Four times a day (QID) | ORAL | Status: DC | PRN

## 2015-05-01 MED ORDER — ONDANSETRON HCL 4 MG/2ML IJ SOLN
4.0000 mg | Freq: Four times a day (QID) | INTRAMUSCULAR | Status: DC | PRN
  Administered 2015-05-01 – 2015-05-03 (×2): 4 mg via INTRAVENOUS
  Filled 2015-05-01 (×2): qty 2

## 2015-05-01 MED ORDER — SODIUM CHLORIDE 0.9 % IV SOLN
INTRAVENOUS | Status: DC
  Administered 2015-05-01 – 2015-05-04 (×5): via INTRAVENOUS

## 2015-05-01 MED ORDER — GLUCERNA SHAKE PO LIQD
237.0000 mL | Freq: Two times a day (BID) | ORAL | Status: DC
  Administered 2015-05-01 – 2015-05-03 (×3): 237 mL via ORAL

## 2015-05-01 MED ORDER — ENSURE ENLIVE PO LIQD
237.0000 mL | Freq: Two times a day (BID) | ORAL | Status: DC
  Administered 2015-05-01: 237 mL via ORAL

## 2015-05-01 MED ORDER — INSULIN ASPART 100 UNIT/ML ~~LOC~~ SOLN
0.0000 [IU] | SUBCUTANEOUS | Status: DC
  Administered 2015-05-01 (×2): 2 [IU] via SUBCUTANEOUS
  Administered 2015-05-01 – 2015-05-02 (×8): 1 [IU] via SUBCUTANEOUS
  Administered 2015-05-03 (×2): 3 [IU] via SUBCUTANEOUS
  Administered 2015-05-03 – 2015-05-04 (×3): 1 [IU] via SUBCUTANEOUS
  Administered 2015-05-04 (×2): 2 [IU] via SUBCUTANEOUS
  Administered 2015-05-04: 3 [IU] via SUBCUTANEOUS
  Administered 2015-05-05 (×2): 1 [IU] via SUBCUTANEOUS

## 2015-05-01 MED ORDER — CIPROFLOXACIN IN D5W 400 MG/200ML IV SOLN
400.0000 mg | Freq: Two times a day (BID) | INTRAVENOUS | Status: DC
  Administered 2015-05-01 – 2015-05-05 (×9): 400 mg via INTRAVENOUS
  Filled 2015-05-01 (×9): qty 200

## 2015-05-01 NOTE — Care Management Important Message (Signed)
Important Message  Patient Details  Name: [name] MRN: [id number] Date of Birth: Mar 19, 1940   Medicare Important Message Given:  Yes    Alvie Heidelberg, RN 05/01/2015, 11:27 AM

## 2015-05-01 NOTE — H&P (Signed)
PCP:   [name]., MD   Chief Complaint:  diarrhea  HPI: 75 yo female comes in with over a day of diarrhea which is bloody with associated lower abdominal cramps and a couple of vomiting episodes nonbloody.  Pt was recently admitted last month with sepsis from uti.  Pt denies any sick contacts.  Denies any fevers.  No urinary symptoms.  Pt found to have acute colitis, referred for admission for such.  Her hgb is normal.  Review of Systems:  Positive and negative as per HPI otherwise all other systems are negative  Past Medical History: Past Medical History  Diagnosis Date  . Diabetes mellitus     x 20 yrs  . Lupus ([place])     sle  . Fibromyalgia   . Thrombocytopenia, unspecified ([place]) 09/04/2013  . Lung cancer ([place]) 01/28/2015    left upper lobe lung /right upper lobe lung  . Fibromyalgia   . RA (rheumatoid arthritis) ([place])     ra  . Tobacco use   . Peripheral neuropathy [facility])    Past Surgical History  Procedure Laterality Date  . Appendectomy    . Cesarean section  x3  . Oophorectomy    . Cataract extraction w/ intraocular lens implant  2010    right eye  . Abdominal hysterectomy    . Left arm surgery      multiple surgeries on  . Trigger finger release  yrs ago    right hand  . Knee arthroscopy  yrs ago    left knee  . Shoulder hemi-arthroplasty  01/27/2011    Procedure: SHOULDER HEMI-ARTHROPLASTY;  Surgeon: Nita Sells, MD;  Location: [facility];  Service: Orthopedics;  Laterality: Right;  interscaline right shoulder    Medications: Prior to Admission medications   Medication Sig Start Date End Date Taking? Authorizing Provider  albuterol (PROVENTIL HFA;VENTOLIN HFA) 108 (90 BASE) MCG/ACT inhaler Inhale 2 puffs into the lungs every 6 (six) hours as needed for wheezing or shortness of breath. Please instruct in usage. 09/06/13  Yes Samuella Cota, MD  alendronate (FOSAMAX) 70 MG tablet Take 1 tablet by mouth Once a week. On Sunday 04/15/11  Yes  Historical Provider, MD  aspirin EC 81 MG tablet Take 81 mg by mouth 2 (two) times daily.    Yes Historical Provider, MD  cholecalciferol (VITAMIN D) 1000 UNITS tablet Take 1,000 Units by mouth daily.    Yes Historical Provider, MD  folic acid (FOLVITE) 1 MG tablet Take 1 mg by mouth daily.   Yes Historical Provider, MD  methotrexate 2.5 MG tablet Take 7.5 mg by mouth 2 (two) times a week.   Yes Historical Provider, MD  metoprolol tartrate (LOPRESSOR) 25 MG tablet Take 25 mg by mouth daily.    Yes Historical Provider, MD  Multiple Vitamins-Minerals (PRESERVISION AREDS 2) CAPS Take 2 capsules by mouth 2 (two) times daily.   Yes Historical Provider, MD  naproxen sodium (ANAPROX) 220 MG tablet Take 220-440 mg by mouth daily as needed (for pain).    Yes Historical Provider, MD  oxyCODONE-acetaminophen (PERCOCET/ROXICET) 5-325 MG tablet Take 1 tablet by mouth every 6 (six) hours as needed for moderate pain. 04/02/15  Yes Samuella Cota, MD  pantoprazole (PROTONIX) 40 MG tablet Take 1 tablet (40 mg total) by mouth daily before breakfast. 04/02/15  Yes Samuella Cota, MD  tetrahydrozoline-zinc (VISINE-AC) 0.05-0.25 % ophthalmic solution Place 2 drops into both eyes daily as needed (dry eyes).   Yes Historical  Provider, MD  cefUROXime (CEFTIN) 500 MG tablet Take 1 tablet (500 mg total) by mouth 2 (two) times daily with a meal. Patient not taking: Reported on 04/30/2015 04/02/15   Samuella Cota, MD  polyethylene glycol-electrolytes (NULYTELY/GOLYTELY) 420 g solution Take 4,000 mLs by mouth once. 04/22/15   Butch Penny, NP    Allergies:   Allergies  Allergen Reactions  . Bupropion Nausea Only  . Lopid  [Gemfibrozil] Nausea Only    Social History:  reports that she has been smoking Cigarettes.  She has a 60 pack-year smoking history. She does not have any smokeless tobacco history on file. She reports that she does not drink alcohol or use illicit drugs.  Family History: Family History   Problem Relation Age of Onset  . Family history unknown: Yes  no premature CAD  Physical Exam: Filed Vitals:   04/30/15 2030 04/30/15 2100 04/30/15 2230 05/01/15 0024  BP: 98/50 109/49 106/48 99/48  Pulse: 126 120 107 100  Temp:    98 F (36.7 C)  TempSrc:    Oral  Resp: '24 21 16 23  '$ Height:    '5\' 5"'$  (1.651 m)  Weight:    70.3 kg (154 lb 15.7 oz)  SpO2: 99% 95% 99% 98%   General appearance: alert, cooperative and no distress Head: Normocephalic, without obvious abnormality, atraumatic Eyes: negative Nose: Nares normal. Septum midline. Mucosa normal. No drainage or sinus tenderness. Neck: no JVD and supple, symmetrical, trachea midline Lungs: clear to auscultation bilaterally Heart: regular rate and rhythm, S1, S2 normal, no murmur, click, rub or gallop Abdomen: soft, non-tender; bowel sounds normal; no masses,  no organomegaly Extremities: extremities normal, atraumatic, no cyanosis or edema Pulses: 2+ and symmetric Skin: Skin color, texture, turgor normal. No rashes or lesions Neurologic: Grossly normal    Labs on Admission:   Recent Labs  04/30/15 2030 04/30/15 2045  NA 136 141  K 3.9 3.9  CL 101 103  CO2 22  --   GLUCOSE 271* 269*  BUN 18 18  CREATININE 1.41* 1.30*  CALCIUM 8.7*  --     Recent Labs  04/30/15 2030  AST 28  ALT 14  ALKPHOS 120  BILITOT 0.9  PROT 8.2*  ALBUMIN 3.8    Recent Labs  04/30/15 2030 04/30/15 2045  WBC 40.8*  --   NEUTROABS 32.2*  --   HGB 14.0 17.0*  HCT 44.9 50.0*  MCV 95.3  --   PLT 101*  --    Radiological Exams on Admission: Ct Abdomen Pelvis W Contrast  04/30/2015  CLINICAL DATA:  Vomiting and pain since this morning.  Hematochezia. EXAM: CT ABDOMEN AND PELVIS WITH CONTRAST TECHNIQUE: Multidetector CT imaging of the abdomen and pelvis was performed using the standard protocol following bolus administration of intravenous contrast. CONTRAST:  182m ISOVUE-300 IOPAMIDOL (ISOVUE-300) INJECTION 61% COMPARISON:   03/30/2015 FINDINGS: Lower chest:  No significant abnormality Hepatobiliary: There are normal appearances of the liver, gallbladder and bile ducts. Pancreas: Normal Spleen: Normal Adrenals/Urinary Tract: Both adrenals are normal. There is a 2.7 cm cyst at the lateral aspect of the left kidney. Kidneys and ureters are otherwise unremarkable. Urinary bladder is nearly empty but grossly unremarkable. Stomach/Bowel: Moderate gastric distention. There is mild fluid distention of small bowel. There is moderate fluid distention of the colon, consistent with diarrhea. No evidence of a bowel obstruction. No focal inflammatory changes are evident. No significant mesenteric edema or fluid. No pneumatosis. Mild uncomplicated colonic diverticulosis. Vascular/Lymphatic: The abdominal  aorta is heavily calcified and a rather small vessel. There is fusiform dilatation in the infrarenal aorta, but the maximum AP diameter is only 2 cm. There is heavily calcified common iliac artery plaque bilaterally, probably with significant stenosis. Celiac and SMA are patent but there is significant calcified plaque at their origins. Reproductive: Hysterectomy.  No adnexal abnormalities. Other: No ascites.  No extraluminal air. Musculoskeletal: No significant skeletal lesions. Moderately severe degenerative lumbar disc disease. IMPRESSION: 1. Mild to moderate fluid distention of small and large bowel without obstruction and without focal inflammatory change. This may represent an infectious enteropathy. No evidence of obstruction, ischemia, mass or focal bowel lesion. 2. Mild uncomplicated colonic diverticulosis. 3. The abdominal aorta and its major branches are heavily calcified and rather small in caliber. There probably are significant stenoses of the common iliac arteries bilaterally. The origins of the celiac and superior mesenteric arteries are heavily calcified. Electronically Signed   By: Andreas Newport M.D.   On: 04/30/2015 22:16    Old chart reviewed  Assessment/Plan  75 yo female with acute colitis with recent antibiotic usage  Principal Problem:   Sepsis ([place])-  Pt meets sepsis protocol with elevation of wbc, increased lactic acid, tachycardia and soft bp.  Place on sepsis protocol.  Obtain stool culture.  Check lactic acid level q 3 hours until resolves, pt has received over 2 liters of ivf and her bp and HR have normalized.  Check procalcitonin level.  Active Problems:   Colitis, acute- cover with iv cipro and flagyl.  cdfiff pcr is neg.  Obtain full stool cx panel.  Blood cultures also obtained.  Diarrhea bloody, hgb is normal.  Repeat h/h in am.  Abdominal exam is benign.  Place of full liq diet for now and advance as tolerates.  Sepsis protocol initiated.   Diabetes mellitus, type 2 ([place])- noted, ssi   Essential hypertension- noted, hold bp meds at this time due to soft bp   Lung cancer, upper lobe ([place])- noted   AKI (acute kidney injury) ([place])- mild, ivf.  Repeat in am  Admit to tele floor.  Pt wishes to be DNR, does not ever want cpr or intubation ever in the future under no circumstances.    Aahan Marques A 05/01/2015, 12:42 AM

## 2015-05-01 NOTE — Consult Note (Addendum)
Referring Provider: No ref. provider found Primary Care Physician:  [name]., MD Primary Gastroenterologist:  DR. Laural Golden  Reason for Consultation:  RECTAL BLEEDING   Impression: ADMITTED WITH SUDDEN ONSET OF ABDOMINAL PAIN/VOMITING/DIARRHEA DUE TO INFECTIOUS V. ISCHEMIC COLITIS, DOUBT ACUTE MESENTERIC ISCHEMIA. PT CLINICALLY IMPROVED.   Plan: 1. CONTINUE TO MONITOR SYMPTOMS: ACUTE ABDOMEN DUE TO MESENTERIC OCCLUSION/INFARCT 2. AGGRESSIVE HYDRATION 3. MONITOR Hb AND CO2. 4. Plan for TCS PRIOR TO DISCHARGE. 5. CONTINUE CIP/FLAGYL 6. PROTONIX DAILY 7. AWAIT STOOL PANEL       HPI:  Was in hospital 4 weeks and had brbpr. HAD CONSTIPATION DUE TO OXYCODONE. Was d/c and didn't have diarrhea. SAW TERRI SETZER IN [facility] AND WAS SCHEDULED FOR TCS IN JUL 2017. WAS IN HER USUAL STATE OF HEALTH. HAD PT. ATE FOOD FROM [employer] AND 1 HR LATER THREW UP/ABDOMINAL PAIN/SEVERAL WATERY STOOLS WITH BLOOD. SUBJECTIVE FEVER/CHILLS. C DIFF NEGATIVE. BLOODY DIARRHEA STARTED YESTERDAY. MILD NAUSEA BUT NO MORE VOMITING. SHARP RLQ ABDOMINAL PAIN. LAST TCS 2008. LAST EGD   PT DENIES FEVER, CHILLS, HEMATOCHEZIA, HEMATEMESIS, nausea, vomiting, melena, diarrhea, CHEST PAIN, SHORTNESS OF BREATH,  CHANGE IN BOWEL IN HABITS,  problems swallowing, problems with sedation, OR heartburn or indigestion.    Past Medical History  Diagnosis Date  . Diabetes mellitus     x 20 yrs  . Lupus ([place])     sle  . Fibromyalgia   . Thrombocytopenia, unspecified ([place]) 09/04/2013  . Lung cancer ([place]) 01/28/2015    left upper lobe lung /right upper lobe lung  . Fibromyalgia   . RA (rheumatoid arthritis) ([place])     ra  . Tobacco use   . Peripheral neuropathy [facility])     Past Surgical History  Procedure Laterality Date  . Appendectomy    . Cesarean section  x3  . Oophorectomy    . Cataract extraction w/ intraocular lens implant  2010    right eye  . Abdominal hysterectomy    . Left arm surgery      multiple  surgeries on  . Trigger finger release  yrs ago    right hand  . Knee arthroscopy  yrs ago    left knee  . Shoulder hemi-arthroplasty  01/27/2011    Procedure: SHOULDER HEMI-ARTHROPLASTY;  Surgeon: Nita Sells, MD;  Location: [facility];  Service: Orthopedics;  Laterality: Right;  interscaline right shoulder    Prior to Admission medications   Medication Sig Start Date End Date Taking? Authorizing Provider  albuterol (PROVENTIL HFA;VENTOLIN HFA) 108 (90 BASE) MCG/ACT inhaler Inhale 2 puffs into the lungs every 6 (six) hours as needed for wheezing or shortness of breath. Please instruct in usage. 09/06/13  Yes Samuella Cota, MD  alendronate (FOSAMAX) 70 MG tablet Take 1 tablet by mouth Once a week. On Sunday 04/15/11  Yes Historical Provider, MD  aspirin EC 81 MG tablet Take 81 mg by mouth 2 (two) times daily.    Yes Historical Provider, MD  cholecalciferol (VITAMIN D) 1000 UNITS tablet Take 1,000 Units by mouth daily.    Yes Historical Provider, MD  folic acid (FOLVITE) 1 MG tablet Take 1 mg by mouth daily.   Yes Historical Provider, MD  methotrexate 2.5 MG tablet Take 7.5 mg by mouth 2 (two) times a week.   Yes Historical Provider, MD  metoprolol tartrate (LOPRESSOR) 25 MG tablet Take 25 mg by mouth daily.    Yes Historical Provider, MD  Multiple Vitamins-Minerals (PRESERVISION AREDS 2) CAPS Take 2 capsules  by mouth 2 (two) times daily.   Yes Historical Provider, MD  naproxen sodium (ANAPROX) 220 MG tablet Take 220-440 mg by mouth daily as needed (for pain).    Yes Historical Provider, MD  oxyCODONE-acetaminophen (PERCOCET/ROXICET) 5-325 MG tablet Take 1 tablet by mouth every 6 (six) hours as needed for moderate pain. 04/02/15  Yes Samuella Cota, MD  pantoprazole (PROTONIX) 40 MG tablet Take 1 tablet (40 mg total) by mouth daily before breakfast. 04/02/15  Yes Samuella Cota, MD  tetrahydrozoline-zinc (VISINE-AC) 0.05-0.25 % ophthalmic solution Place 2 drops into both eyes daily  as needed (dry eyes).   Yes Historical Provider, MD  cefUROXime (CEFTIN) 500 MG tablet Take 1 tablet (500 mg total) by mouth 2 (two) times daily with a meal. Patient not taking: Reported on 04/30/2015 04/02/15   Samuella Cota, MD  polyethylene glycol-electrolytes (NULYTELY/GOLYTELY) 420 g solution Take 4,000 mLs by mouth once. 04/22/15   Butch Penny, NP    Current Facility-Administered Medications  Medication Dose Route Frequency Provider Last Rate Last Dose  . 0.9 %  sodium chloride infusion   Intravenous Continuous Phillips Grout, MD 100 mL/hr at 05/01/15 0157    . ciprofloxacin (CIPRO) IVPB 400 mg  400 mg Intravenous Q12H Phillips Grout, MD      . feeding supplement (ENSURE ENLIVE) (ENSURE ENLIVE) liquid 237 mL  237 mL Oral BID BM Rachal Rayvon Char, MD      . insulin aspart (novoLOG) injection 0-9 Units  0-9 Units Subcutaneous 6 times per day Phillips Grout, MD   1 Units at 05/01/15 [phone]  . metroNIDAZOLE (FLAGYL) IVPB 500 mg  500 mg Intravenous Q8H Phillips Grout, MD   500 mg at 05/01/15 7062  . ondansetron (ZOFRAN) tablet 4 mg  4 mg Oral Q6H PRN Phillips Grout, MD       Or  . ondansetron [facility]) injection 4 mg  4 mg Intravenous Q6H PRN Phillips Grout, MD   4 mg at 05/01/15 0159  . sodium chloride flush (NS) 0.9 % injection 3 mL  3 mL Intravenous Q12H Phillips Grout, MD   3 mL at 05/01/15 0158   Facility-Administered Medications Ordered in Other Encounters  Medication Dose Route Frequency Provider Last Rate Last Dose  . 0.9 %  sodium chloride infusion   Intravenous Continuous Hurley Cisco, MD 20 mL/hr at 10/04/10 1339      Allergies as of 04/30/2015 - Review Complete 04/30/2015  Allergen Reaction Noted  . Bupropion Nausea Only 02/20/2015  . Lopid  [gemfibrozil] Nausea Only 02/20/2015    Family History  Problem Relation Age of Onset  .   NO COLON CA OR POLYP   Social History   Social History  . Marital Status: Widowed    Spouse Name: N/A  . Number of Children: N/A  .  Years of Education: N/A   Occupational History  . Not on file.   Social History Main Topics  . Smoking status: Current Every Day Smoker -- 1.50 packs/day for 40 years    Types: Cigarettes  . Smokeless tobacco: Not on file     Comment: 2 packs a day x 30 yrs  . Alcohol Use: No  . Drug Use: No  . 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   Review of Systems: PER HPI OTHERWISE ALL SYSTEMS ARE NEGATIVE.   Vitals: Blood pressure 88/56, pulse 99, temperature 98.2 F (36.8  C), temperature source Oral, resp. rate 22, height '5\' 5"'$  (1.651 m), weight 154 lb 15.7 oz (70.3 kg), SpO2 97 %.  Physical Exam: General:   Alert,  Well-developed, well-nourished, pleasant and cooperative in NAD Head:  Normocephalic and atraumatic. Eyes:  Sclera clear, no icterus.   Conjunctiva pink. Mouth:  No lesions Neck:  Supple; no masses. Lungs:  Clear throughout to auscultation.   No wheezes. No acute distress. Heart:  Regular rate and IRREGULAR rhythm; murmur PRESENT. Abdomen:  Soft, MILDLY tender IN RLQ and nondistended. No masses noted. Normal bowel sounds, without guarding, and without rebound.   Msk:  Symmetrical without gross deformities. Normal posture. Extremities:  Without edema. Neurologic:  Alert and  oriented x4;  NO  NEW FOCAL DEFICITS Cervical Nodes:  No significant cervical adenopathy. Psych:  Alert and cooperative. Normal mood and affect.  Lab Results:  Recent Labs  04/30/15 2030 04/30/15 2045 05/01/15 0331  WBC 40.8*  --  23.7*  HGB 14.0 17.0* 10.7*  HCT 44.9 50.0* 33.9*  PLT 101*  --  66*   BMET  Recent Labs  04/30/15 2030 04/30/15 2045 05/01/15 0331  NA 136 141 139  K 3.9 3.9 3.9  CL 101 103 111  CO2 22  --  20*  GLUCOSE 271* 269* 186*  BUN '18 18 15  '$ CREATININE 1.41* 1.30* 1.18*  CALCIUM 8.7*  --  7.2*   LFT  Recent Labs  04/30/15 2030  PROT 8.2*  ALBUMIN 3.8  AST 28  ALT 14  ALKPHOS 120  BILITOT 0.9   C  DIFF PCR: NEGATIVE  Studies/Results: CT ABD/PELVIS APR 13-PLAQUE/STENOSIS AT SMA/CELIAC, DILATED SMALL BOWEL/COLON   LOS: 1 day   Everard Interrante  05/01/2015, 8:52 AM

## 2015-05-01 NOTE — Progress Notes (Signed)
Initial Nutrition Assessment  DOCUMENTATION CODES:  Not applicable  INTERVENTION:  Glucerna Shake po BID, each supplement provides 220 kcal and 10 grams of protein  NUTRITION DIAGNOSIS:  Increased nutrient needs related to acute illness as evidenced by estimated nutritional requirements for the condition  GOAL:  Patient will meet greater than or equal to 90% of their needs  MONITOR:  PO intake, Supplement acceptance, Labs  REASON FOR ASSESSMENT:  Malnutrition Screening Tool    ASSESSMENT:  75 y/o female PMHx Dm, Lupus, Lung cancer, RA presents with >1 day bloody diarrhea w/ abdominal cramps. Admitted and worked up for sepsis r/t acute colitis.   Pt says she has not had any appetite since Thursday at which time she begun vomiting. Prior to that, her eating habits were as follows. She ate twice daily, did took preservision mvi, did not drink any nutritional supplements and she followed a very loose DM diet. She has not been checking her sugars recently. She states she used to be on hyperglycemic medications, but was taken off of them due to A1c being WDL.   Pt admitted at 150 lbs. Pt states her UBW is ~153-155 lbs. She stated her weight at an outpatient appointment was 143 lbs which is why she reported wt loss.   Despite eating 75% of breakfast, her lunch tray is seen untouched. Pt still says she has very poor appetite. She did not like the Ensure Enlive. Was agreeable to the [place], though after assessing BG, would best be suited for glucerna.  NFPE: WDL  Labs reviewed: WBC: 23.7, RBC: 3.56, h/h: 10.7/33.9, lactic acid:2.8   Recent Labs Lab 04/30/15 2030 04/30/15 2045 05/01/15 0331  NA 136 141 139  K 3.9 3.9 3.9  CL 101 103 111  CO2 22  --  20*  BUN '18 18 15  '$ CREATININE 1.41* 1.30* 1.18*  CALCIUM 8.7*  --  7.2*  GLUCOSE 271* 269* 186*   Diet Order:  Diet Heart Room service appropriate?: Yes; Fluid consistency:: Thin  Skin: Dry, intact  Last BM:   4/14-diarrhea  Height:  Ht Readings from Last 1 Encounters:  05/01/15 '5\' 5"'$  (1.651 m)   Weight:  Wt Readings from Last 1 Encounters:  05/01/15 154 lb 15.7 oz (70.3 kg)   Wt Readings from Last 10 Encounters:  05/01/15 154 lb 15.7 oz (70.3 kg)  04/24/15 145 lb (65.772 kg)  04/21/15 145 lb 4.8 oz (65.908 kg)  04/02/15 156 lb 4.9 oz (70.9 kg)  03/06/15 153 lb 4.8 oz (69.536 kg)  09/06/13 149 lb (67.586 kg)  12/17/12 160 lb (72.576 kg)  10/19/12 165 lb (74.844 kg)  08/21/12 160 lb 2 oz (72.632 kg)  06/26/12 156 lb 8.4 oz (71 kg)  admit weight: 150 lbs (68.18 kg)  Ideal Body Weight:  56.82 kg  BMI:  Body mass index is 25.79 kg/(m^2).  Estimated Nutritional Needs:  Kcal:  1650-1850 (24-27 kcal/kg bw) Protein:  68-80 g (1.2-1.4 g/kg bw) Fluid:  1.7-1.9 liters + enough to replace stool losses  EDUCATION NEEDS:  No education needs identified at this time  Burtis Junes RD, LDN Clinical Nutrition Pager: [phone] 05/01/2015 1:33 PM

## 2015-05-01 NOTE — Progress Notes (Signed)
PROGRESS NOTE  REDELL BHANDARI KDT:[id number] DOB: [birth year] DOA: 04/30/2015 PCP: [name]., MD  Summary: 75 year old woman admitted 03/2015 for diarrhea, acute kidney injury, thought to be viral (C. difficile negative, GI pathogen panel negative at that time) with arrangements made for outpatient follow-up who presented to the emergency department 4/13 with recurrent bloody diarrhea; met sepsis criteria on admission, started on empiric antibiotics and admitted for acute colitis.  Assessment/Plan: 1. Sepsis secondary to infectious colitis. Lactic acid trending down, WBC significantly improved, renal function returning to normal. C. difficile again negative. CT with fluid distention of small and large bowel without evidence of obstruction, ischemia, mass or focal bowel lesion. 2. Acute colitis, presumed infectious. Blood work is improving. Continue empiric antibiotics. 3. Acute kidney injury. Much improved with fluids. 4. Diabetes mellitus type 2. Anion gap within normal limits. Blood sugar stable. 5. Anemia of chronic disease at baseline. 6. COPD, stable.   7. Chronic thrombocytopenia of unclear etiology. Stable. 8. Lupus 9. PMH Lung cancer s/p radiation. Was not a candidate for chemotherapy. 10. Abdominal aortic atherosclerosis with heavy calcification. Incidental finding on abdominal pelvic CT. Suspected significant stenoses common iliac arteries bilaterally. Will arrange outpatient follow-up with vascular surgery.   Continue empiric antibiotics, repeat BMP and CBC in the a.m.  GI consultation for recurrent diarrhea  Code Status: DNR DVT prophylaxis: SCDs Family Communication: Discussed with patient Disposition Plan: Discharge once improved  Murray Hodgkins, MD  [facility] Direct contact:  --Via amion app OR  --[email or web address]; password [email or web address] and click  [phone] contact night coverage as above 05/01/2015, 3:05 PM  LOS: 1 day    Consultants:  none  Procedures:  none  Antibiotics:  Cipro 4/13 >>  HPI/Subjective: Ongoing diarrhea. Right sided abd pain. Has had nausea since ingesting Ensure.  Breathing okay.    Objective: Filed Vitals:   04/30/15 2100 04/30/15 2230 05/01/15 0024 05/01/15 0500  BP: 109/49 1'06/48 99/48 88/56 '$  Pulse: 120 107 100 99  Temp:   98 F (36.7 C) 98.2 F (36.8 C)  TempSrc:   Oral Oral  Resp: '21 16 23 22  '$ Height:   '5\' 5"'$  (1.651 m)   Weight:   70.3 kg (154 lb 15.7 oz)   SpO2: 95% 99% 98% 97%    Intake/Output Summary (Last 24 hours) at 05/01/15 1505 Last data filed at 05/01/15 1321  Gross per 24 hour  Intake    240 ml  Output     15 ml  Net    225 ml     Filed Weights   04/30/15 2001 05/01/15 0024  Weight: 68.04 kg (150 lb) 70.3 kg (154 lb 15.7 oz)    Exam: General:  Appears comfortable, calm. Cardiovascular: Regular rate and rhythm, no murmur, rub or gallop. No lower extremity edema.  Telemetry: Sinus rhythm, no arrhythmias  Respiratory: Clear to auscultation bilaterally, no wheezes, rales or rhonchi. Normal respiratory effort. Abdomen: soft, ntnd Musculoskeletal: grossly normal tone bilateral upper and lower extremities. Right foot is in a cast. Psychiatric: grossly normal mood and affect, speech fluent and appropriate  New data reviewed:  EKG on admission reviewed, sinus rhythm with PAC. Not atrial fibrillation.  Lactic acid down to 2.8  WBC down to 23.7  Cr 1.18, normal  Blood sugars stable 186   Hgb 10.7 at baseline, Htc 33.9, plat 66 stable  C Diff negative  Scheduled Meds: . ciprofloxacin  400 mg Intravenous Q12H  . feeding supplement (GLUCERNA SHAKE)  237 mL Oral BID BM  .  insulin aspart  0-9 Units Subcutaneous 6 times per day  . metronidazole  500 mg Intravenous Q8H  . pantoprazole  40 mg Oral QAC breakfast  . sodium chloride flush  3 mL Intravenous Q12H   Continuous Infusions: . sodium chloride 100 mL/hr at 05/01/15 0157     Principal Problem:   Sepsis ([place]) Active Problems:   Diabetes mellitus, type 2 ([place])   Essential hypertension   Thrombocytopenia, unspecified ([facility])   AKI (acute kidney injury) ([place])   Colitis, acute   Time spent 25 minutes  By signing my name below, I, Delene Ruffini, attest that this documentation has been prepared under the direction and in the presence of [place]. Sarajane Jews, MD. Electronically Signed: Delene Ruffini, Scribe.  05/01/2015 11:55am  I personally performed the services described in this documentation. All medical record entries made by the scribe were at my direction. I have reviewed the chart and agree that the record reflects my personal performance and is accurate and complete. Murray Hodgkins, MD

## 2015-05-02 DIAGNOSIS — D696 Thrombocytopenia, unspecified: Secondary | ICD-10-CM

## 2015-05-02 LAB — CBC
HCT: 30.6 % — ABNORMAL LOW (ref 36.0–46.0)
HEMOGLOBIN: 9.3 g/dL — AB (ref 12.0–15.0)
MCH: 29.6 pg (ref 26.0–34.0)
MCHC: 30.4 g/dL (ref 30.0–36.0)
MCV: 97.5 fL (ref 78.0–100.0)
Platelets: 73 10*3/uL — ABNORMAL LOW (ref 150–400)
RBC: 3.14 MIL/uL — AB (ref 3.87–5.11)
RDW: 17.3 % — ABNORMAL HIGH (ref 11.5–15.5)
WBC: 8.2 10*3/uL (ref 4.0–10.5)

## 2015-05-02 LAB — BASIC METABOLIC PANEL
ANION GAP: 6 (ref 5–15)
BUN: 10 mg/dL (ref 6–20)
CHLORIDE: 115 mmol/L — AB (ref 101–111)
CO2: 18 mmol/L — ABNORMAL LOW (ref 22–32)
Calcium: 7.5 mg/dL — ABNORMAL LOW (ref 8.9–10.3)
Creatinine, Ser: 0.8 mg/dL (ref 0.44–1.00)
GFR calc Af Amer: >60 mL/min (ref >60)
GFR calc non Af Amer: >60 mL/min (ref >60)
Glucose, Bld: 137 mg/dL — ABNORMAL HIGH (ref 65–99)
POTASSIUM: 3.9 mmol/L (ref 3.5–5.1)
SODIUM: 139 mmol/L (ref 135–145)

## 2015-05-02 LAB — GLUCOSE, CAPILLARY
GLUCOSE-CAPILLARY: 127 mg/dL — AB (ref 65–99)
GLUCOSE-CAPILLARY: 127 mg/dL — AB (ref 65–99)
GLUCOSE-CAPILLARY: 137 mg/dL — AB (ref 65–99)
Glucose-Capillary: 126 mg/dL — ABNORMAL HIGH (ref 65–99)
Glucose-Capillary: 142 mg/dL — ABNORMAL HIGH (ref 65–99)
Glucose-Capillary: 148 mg/dL — ABNORMAL HIGH (ref 65–99)

## 2015-05-02 MED ORDER — ACETAMINOPHEN 325 MG PO TABS
650.0000 mg | ORAL_TABLET | Freq: Four times a day (QID) | ORAL | Status: DC | PRN
  Administered 2015-05-03 (×2): 650 mg via ORAL
  Filled 2015-05-02 (×2): qty 2

## 2015-05-02 MED ORDER — ZOLPIDEM TARTRATE 5 MG PO TABS
5.0000 mg | ORAL_TABLET | Freq: Every evening | ORAL | Status: DC | PRN
  Administered 2015-05-03 – 2015-05-04 (×3): 5 mg via ORAL
  Filled 2015-05-02 (×3): qty 1

## 2015-05-02 NOTE — Progress Notes (Signed)
PROGRESS NOTE  [name] BZJ:[id number] DOB: Jul 18, 1940 DOA: 04/30/2015 PCP: [name]., MD  Summary: 75 year old woman admitted 03/2015 for diarrhea, acute kidney injury, thought to be viral (C. difficile negative, GI pathogen panel negative at that time) with arrangements made for outpatient follow-up who presented to the emergency department 4/13 with recurrent bloody diarrhea; met sepsis criteria on admission, started on empiric antibiotics and admitted for acute colitis. Gradually improving with IV antibiotics with resolution of sepsis and acute kidney injury. Followed by gastroenterology with plans for colonoscopy 4/18.  Assessment/Plan: 1. Sepsis secondary to infectious colitis. Sepsis, acute kidney injury and leukocytosis resolved. Clinically improving. C. difficile in GI pathogen panel negative. CT abdomen and pelvis on admission showed small large bowel fluid distention without evidence of complicating features. 2. Acute colitis, presumed infectious. Blood cultures no growth to date. Continue empiric antibiotics. 3. Acute kidney injury, resolved. 4. Diabetes mellitus type 2, stable 5. Anemia of chronic disease appears to be a baseline. Was hemoconcentrated on admission. 6. Chronic thrombocytopenia of unclear etiology. Gastroenterology has requested hematology consultation for/17. 7. PMH lupus, lung cancer s/p radiation 8. Abdominal aortic atherosclerosis with heavy calcification. Incidental finding on abdominal pelvic CT. Suspected significant stenoses common iliac arteries bilaterally. Will arrange outpatient follow-up with vascular surgery.   Continue empiric antibiotics, repeat CBC in the a.m.  Will consult hematology on Monday  Plans for colonoscopy on Tuesday.  Code Status: DNR DVT prophylaxis: SCDs Family Communication: Discussed with patient Disposition Plan: Discharge once improved  Murray Hodgkins, MD  [facility] Direct contact:  --Via amion app  OR  --[email or web address]; password [email or web address] and click  [phone] contact night coverage as above 05/02/2015, 7:40 AM  LOS: 2 days   Consultants:  none  Procedures:  none  Antibiotics:  Cipro 4/13 >>  Flagyl 4/13 >>  HPI/Subjective: Feels a little better today. Some nausea. Denies any pain. No bleeding noted.  Objective: Filed Vitals:   05/01/15 0500 05/01/15 1531 05/01/15 2121 05/02/15 0456  BP: 88/56 123/37 123/46 126/44  Pulse: 99 100 102 92  Temp: 98.2 F (36.8 C) 98 F (36.7 C) 98 F (36.7 C) 98.1 F (36.7 C)  TempSrc: Oral Oral Oral Oral  Resp: '22 20 20 20  '$ Height:      Weight:      SpO2: 97% 98% 99% 98%    Intake/Output Summary (Last 24 hours) at 05/02/15 0740 Last data filed at 05/02/15 2585  Gross per 24 hour  Intake 3898.33 ml  Output    470 ml  Net 3428.33 ml     Filed Weights   04/30/15 2001 05/01/15 0024  Weight: 68.04 kg (150 lb) 70.3 kg (154 lb 15.7 oz)    Exam: General:  Appears calm and comfortable Cardiovascular: RRR, no m/r/g. No LLE edema. Respiratory: CTA bilaterally, no w/r/r. Normal respiratory effort. Abdomen: positive bowels sounds, soft, ntnd  New data reviewed:  WBC now wnl  BMP unremarkable  Blood sugars stable   Hgb down to 9.3, plat, 73 stable  GI panel negative  Scheduled Meds: . ciprofloxacin  400 mg Intravenous Q12H  . feeding supplement (GLUCERNA SHAKE)  237 mL Oral BID BM  . insulin aspart  0-9 Units Subcutaneous 6 times per day  . metronidazole  500 mg Intravenous Q8H  . pantoprazole  40 mg Oral QAC breakfast  . sodium chloride flush  3 mL Intravenous Q12H   Continuous Infusions: . sodium chloride 100 mL/hr at 05/01/15 2137    Principal Problem:  Sepsis ([place]) Active Problems:   Diabetes mellitus, type 2 ([place])   Essential hypertension   Thrombocytopenia, unspecified ([facility])   AKI (acute kidney injury) ([place])   Colitis, acute   Time spent 20 minutes  By signing my name below, I, Delene Ruffini, attest  that this documentation has been prepared under the direction and in the presence of Shail[place] Thielen P. Sarajane Jews, MD. Electronically Signed: Delene Ruffini, Scribe.  05/02/2015 12:55pm  I personally performed the services described in this documentation. All medical record entries made by the scribe were at my direction. I have reviewed the chart and agree that the record reflects my personal performance and is accurate and complete. Murray Hodgkins, MD

## 2015-05-02 NOTE — Progress Notes (Addendum)
Patient ID: [name], female   DOB: 1940/04/10, 75 y.o.   MRN: [id number]   Assessment/Plan: ADMITTED WITH BLOODY DIARRHEA. CLINICALLY IMPROVED. PT HAS IDIOPATHIC THROMBOCYTOPENIA AND HAS NOT BEEN EVALUATED BY HEMATOLOGY. SHE REPORTS THAT SHE WAS TOLD IT WAS DUE TO LUPUS. IMAGING SHOWS NL LIVER AND SPLEEN.  PLAN: 1. DISCUSSED MANAGEMENT OPTIONS WITH PT: 1. TCS TOMORROW AND LEAVE LARGE POLYPS WITH THE PLAN OF REMOVING THEM AT A FUTURE DATE, OR 2. HEMATOLOGY EVAL/REC'S MON AND TCS TUES TO MINIMIZE RISK OF BLEEDING AND MAY INCLUDE PLT TRANSFUION PRIOR TO TCS. PT ELECTED TO HAVE HEMATOLOGY EVAL PRIOR TO TCS. 2. CONTINUE CIP/FLAG 3. SUPPORTIVE CARE. 4. PROTONIX DAILY. 5. PT/INR IN AM   Subjective: Since I last evaluated the patient HER ABDOMINAL PAIN IS BETTER. SHE HAS LESS RECTAL BLEEDING. TOLERATING POs.  Objective: Vital signs in last 24 hours: Filed Vitals:   05/01/15 2121 05/02/15 0456  BP: 123/46 126/44  Pulse: 102 92  Temp: 98 F (36.7 C) 98.1 F (36.7 C)  Resp: 20 20   General appearance: alert, cooperative and no distress Resp: clear to auscultation bilaterally Cardio: regular rate and rhythm GI: soft, MILDLY tender IN RUQ; bowel sounds normal; NO REBOUND OR GUARDING   Lab Results:  Hb 9.3 CO2 18, STOOL PANEL NEG K 3.9 Cr 0.80  Studies/Results: No results found.  Medications: I have reviewed the patient's current medications.   LOS: 5 days   Barney Drain 06/27/2013, 2:23 PM

## 2015-05-03 LAB — CBC
HCT: 29.8 % — ABNORMAL LOW (ref 36.0–46.0)
Hemoglobin: 9.2 g/dL — ABNORMAL LOW (ref 12.0–15.0)
MCH: 29.6 pg (ref 26.0–34.0)
MCHC: 30.9 g/dL (ref 30.0–36.0)
MCV: 95.8 fL (ref 78.0–100.0)
Platelets: 58 K/uL — ABNORMAL LOW (ref 150–400)
RBC: 3.11 MIL/uL — ABNORMAL LOW (ref 3.87–5.11)
RDW: 16.6 % — ABNORMAL HIGH (ref 11.5–15.5)
WBC: 3.6 K/uL — ABNORMAL LOW (ref 4.0–10.5)

## 2015-05-03 LAB — GLUCOSE, CAPILLARY
Glucose-Capillary: 118 mg/dL — ABNORMAL HIGH (ref 65–99)
Glucose-Capillary: 124 mg/dL — ABNORMAL HIGH (ref 65–99)
Glucose-Capillary: 131 mg/dL — ABNORMAL HIGH (ref 65–99)
Glucose-Capillary: 132 mg/dL — ABNORMAL HIGH (ref 65–99)
Glucose-Capillary: 202 mg/dL — ABNORMAL HIGH (ref 65–99)
Glucose-Capillary: 218 mg/dL — ABNORMAL HIGH (ref 65–99)

## 2015-05-03 LAB — OSMOLALITY, STOOL: Osmolality,Stl: 328 mOsmol/kg

## 2015-05-03 NOTE — Progress Notes (Addendum)
Patient ID: [name], female   DOB: [birth year], 75 y.o.   MRN: [id number]  Assessment/Plan: ADMITTED WITH BLOODY DIARRHEA due to ischemic v. Infectious colitis. CLINICALLY IMPROVED. PT HAS IDIOPATHIC THROMBOCYTOPENIA AND HAS NOT BEEN EVALUATED BY HEMATOLOGY. SHE REPORTS THAT SHE WAS TOLD IT WAS DUE TO LUPUS. IMAGING SHOWS NL LIVER AND SPLEEN.  PLAN: 1. HEMATOLOGY EVAL APR 17 AND PLAN TCS FOR APR 18-FULL LIQUID DIET/MIRALAX PREP/DULCOLAX 10 MG 2 HRS AFTER STARTING MLX PREP AND REPEAT AT 4 HRS. PT DESIRES TO SWITCH FROM DR. REHMAN TO DR. Sia Gabrielsen. 2. CONTINUE CIP/FLAG 3. SUPPORTIVE CARE. 4. PROTONIX DAILY. 5. PT/INR IN AM 6. D/C RECTAL TUBE  Subjective: Since I last evaluated the patient HER ABDOMINAL PAIN IS BETTER. SHE DENIES RECTAL BLEEDING. NO NAUSEA OR VOMITING. REPORTS SHE HAD POSTOP BLEEDING AFTER HR SHOULDER SURGERY THAT REQUIRED A PLATELET TRANSFUSION.  Objective: Vital signs in last 24 hours: Filed Vitals:   05/02/15 2006 05/03/15 0512  BP: 136/60 131/60  Pulse: 85 95  Temp: 98 F (36.7 C) 97.8 F (36.6 C)  Resp: 20 20   General appearance: alert, cooperative and no distress Resp: clear to auscultation bilaterally Cardio: regular rate and rhythm GI: soft, MILDLY tender RUQ; NO REBOUND OR GUARDING, bowel sounds normal;   Lab Results:   4/14 4/16 WBC 23 3.6 Hb  10.7 9.2 PLT CT  58   Studies/Results: No results found.  Medications: I have reviewed the patient's current medications.   LOS: 5 days   Barney Drain 06/27/2013, 2:23 PM

## 2015-05-03 NOTE — Progress Notes (Signed)
Removed Flexiseal per MD order.  Pt tolerated well.

## 2015-05-03 NOTE — Progress Notes (Signed)
[facility] PROGRESS NOTE  SUANN KLIER WNI:[id number] DOB: 04/26/1940 DOA: 04/30/2015 PCP: [name]., MD   Brief narrative 75 year old female with history of type 2 diabetes mellitus, anemia of chronic disease and chronic thrombocytopenia of unclear etiology admitted with sepsis due to infectious colitis associated with recurrent bloody diarrhea (patient was admitted one month back for diarrhea presumed to be viral with AKI). Patient was septic on admission with hypotension, elevated lactic acid of 2.6 and severe leukocytosis (40 K). CT abdomen and pelvis showed mild to moderate fluid distention of small and large bowel without obstruction or focal inflammation. Symptoms suggestive of infectious enteropathy. Sepsis pathway initiated with aggressive IV fluids and empiric antibiotics. GI following.    Assessment/Plan: Sepsis secondary to infectious colitis Now resolved . Clinically improved (has minimal right lower quadrant abdominal pain and no bloody stools). -Stool for C. difficile and GI pathogen panel negative. CT abdomen and pelvis on admission suggestive of infectious enteropathy without any complications. Blood cultures remain negative. Continue empiric antibiotics. -Appreciate GI evaluation. Plan on colonoscopy after evaluated by hematology for chronic thrombocytopenia on 4/17.  Acute kidney injury Prerenal secondary to GI bleed and dehydration. Now resolved.  Anemia of chronic disease Stable.  Chronic thrombocytopenia No clear etiology. Patient informed that it is due to lupus. Imaging shows normal liver and spleen. Hematology consulted.  History of lupus/lung cancer status post radiation  Diabetes mellitus type 2 Stable. Monitor on sliding scale coverage.  Abdominal aortic atherosclerosis, with heavy calcification Incidental finding on abdominal CT. Suspected significant stenosis of, and I get arteries bilaterally. Needs outpatient vascular surgery  evaluation.   Diet: Heart healthy/diabetic DVT prophylaxis: SCDs  Code Status: DO NOT RESUSCITATE Family Communication: None at bedside Disposition Plan: Pending hematology evaluation followed by colonoscopy   Consultants:  GI  Hematology consulted    Procedures:  CT abdomen and pelvis  Antibiotics:  Cipro and Flagyl since 4/13  HPI/Subjective: Seen and examined. Reports mild pain in her right lower quadrant, significantly improved. No further rectal bleed.  Objective: Filed Vitals:   05/02/15 2006 05/03/15 0512  BP: 136/60 131/60  Pulse: 85 95  Temp: 98 F (36.7 C) 97.8 F (36.6 C)  Resp: 20 20    Intake/Output Summary (Last 24 hours) at 05/03/15 1048 Last data filed at 05/02/15 1310  Gross per 24 hour  Intake    240 ml  Output      0 ml  Net    240 ml   Filed Weights   05/01/15 0024 05/02/15 0830 05/03/15 0512  Weight: 70.3 kg (154 lb 15.7 oz) 68.9 kg (151 lb 14.4 oz) 69.6 kg (153 lb 7 oz)    Exam:   General: Elderly female appears fatigued, not in distress  HEENT: Pallor present, moist mucosa, supple neck  Chest: Clear bilaterally  CVS: Normal S1 and S2, no murmurs rub or gallop  GI: Soft, nondistended, mild right lower quadrant tenderness, bowel sounds present  Musculoskeletal: Warm, no edema  CNS: Alert and oriented  Data Reviewed: Basic Metabolic Panel:  Recent Labs Lab 04/30/15 2030 04/30/15 2045 05/01/15 0331 05/02/15 0445  NA 136 141 139 139  K 3.9 3.9 3.9 3.9  CL 101 103 111 115*  CO2 22  --  20* 18*  GLUCOSE 271* 269* 186* 137*  BUN '18 18 15 10  '$ CREATININE 1.41* 1.30* 1.18* 0.80  CALCIUM 8.7*  --  7.2* 7.5*   Liver Function Tests:  Recent Labs Lab 04/30/15 2030  AST 28  ALT 14  ALKPHOS 120  BILITOT 0.9  PROT 8.2*  ALBUMIN 3.8   No results for input(s): LIPASE, AMYLASE in the last 168 hours. No results for input(s): AMMONIA in the last 168 hours. CBC:  Recent Labs Lab 04/30/15 2030 04/30/15 2045  05/01/15 0331 05/02/15 0445 05/03/15 0627  WBC 40.8*  --  23.7* 8.2 3.6*  NEUTROABS 32.2*  --   --   --   --   HGB 14.0 17.0* 10.7* 9.3* 9.2*  HCT 44.9 50.0* 33.9* 30.6* 29.8*  MCV 95.3  --  95.2 97.5 95.8  PLT 101*  --  66* 73* 58*   Cardiac Enzymes: No results for input(s): CKTOTAL, CKMB, CKMBINDEX, TROPONINI in the last 168 hours. BNP (last 3 results) No results for input(s): BNP in the last 8760 hours.  ProBNP (last 3 results) No results for input(s): PROBNP in the last 8760 hours.  CBG:  Recent Labs Lab 05/02/15 1631 05/02/15 2008 05/03/15 0012 05/03/15 0509 05/03/15 0726  GLUCAP 126* 148* 118* 131* 124*    Recent Results (from the past 240 hour(s))  C difficile quick scan w PCR reflex     Status: None   Collection Time: 04/30/15 10:40 PM  Result Value Ref Range Status   C Diff antigen NEGATIVE NEGATIVE Final   C Diff toxin NEGATIVE NEGATIVE Final   C Diff interpretation Negative for toxigenic C. difficile  Final  Blood culture (routine x 2)     Status: None (Preliminary result)   Collection Time: 04/30/15 10:55 PM  Result Value Ref Range Status   Specimen Description BLOOD RIGHT HAND  Final   Special Requests BOTTLES DRAWN AEROBIC AND ANAEROBIC 6CC EACH  Final   Culture NO GROWTH 3 DAYS  Final   Report Status PENDING  Incomplete  Blood culture (routine x 2)     Status: None (Preliminary result)   Collection Time: 04/30/15 10:55 PM  Result Value Ref Range Status   Specimen Description BLOOD LEFT ANTECUBITAL  Final   Special Requests BOTTLES DRAWN AEROBIC AND ANAEROBIC 12 CC EACH  Final   Culture PENDING  Incomplete   Report Status PENDING  Incomplete  Gastrointestinal Panel by PCR , Stool     Status: None   Collection Time: 05/01/15  3:39 AM  Result Value Ref Range Status   Campylobacter species NOT DETECTED NOT DETECTED Final   Plesimonas shigelloides NOT DETECTED NOT DETECTED Final   Salmonella species NOT DETECTED NOT DETECTED Final   Yersinia  enterocolitica NOT DETECTED NOT DETECTED Final   Vibrio species NOT DETECTED NOT DETECTED Final   Vibrio cholerae NOT DETECTED NOT DETECTED Final   Enteroaggregative E coli (EAEC) NOT DETECTED NOT DETECTED Final   Enteropathogenic E coli (EPEC) NOT DETECTED NOT DETECTED Final   Enterotoxigenic E coli (ETEC) NOT DETECTED NOT DETECTED Final   Shiga like toxin producing E coli (STEC) NOT DETECTED NOT DETECTED Final   E. coli O157 NOT DETECTED NOT DETECTED Final   Shigella/Enteroinvasive E coli (EIEC) NOT DETECTED NOT DETECTED Final   Cryptosporidium NOT DETECTED NOT DETECTED Final   Cyclospora cayetanensis NOT DETECTED NOT DETECTED Final   Entamoeba histolytica NOT DETECTED NOT DETECTED Final   Giardia lamblia NOT DETECTED NOT DETECTED Final   Adenovirus F40/41 NOT DETECTED NOT DETECTED Final   Astrovirus NOT DETECTED NOT DETECTED Final   Norovirus GI/GII NOT DETECTED NOT DETECTED Final   Rotavirus A NOT DETECTED NOT DETECTED Final   Sapovirus (I, II, IV, and V) NOT  DETECTED NOT DETECTED Final     Studies: No results found.  Scheduled Meds: . ciprofloxacin  400 mg Intravenous Q12H  . feeding supplement (GLUCERNA SHAKE)  237 mL Oral BID BM  . insulin aspart  0-9 Units Subcutaneous 6 times per day  . metronidazole  500 mg Intravenous Q8H  . pantoprazole  40 mg Oral QAC breakfast  . sodium chloride flush  3 mL Intravenous Q12H   Continuous Infusions: . sodium chloride 50 mL/hr at 05/03/15 0912     Time spent: 25 minutes    Ilham Roughton, [facility] Pager [phone] If 7PM-7AM, please contact night-coverage at [email or web address], password [email or web address] 05/03/2015, 10:48 AM  LOS: 3 days

## 2015-05-04 DIAGNOSIS — K625 Hemorrhage of anus and rectum: Secondary | ICD-10-CM

## 2015-05-04 LAB — GLUCOSE, CAPILLARY
GLUCOSE-CAPILLARY: 155 mg/dL — AB (ref 65–99)
GLUCOSE-CAPILLARY: 156 mg/dL — AB (ref 65–99)
GLUCOSE-CAPILLARY: 237 mg/dL — AB (ref 65–99)
Glucose-Capillary: 95 mg/dL (ref 65–99)
Glucose-Capillary: 118 mg/dL — ABNORMAL HIGH (ref 65–99)
Glucose-Capillary: 147 mg/dL — ABNORMAL HIGH (ref 65–99)

## 2015-05-04 LAB — RETICULOCYTES
RBC.: 3.37 MIL/uL — ABNORMAL LOW (ref 3.87–5.11)
RETIC CT PCT: 3.5 % — AB (ref 0.4–3.1)
Retic Count, Absolute: 118 10*3/uL (ref 19.0–186.0)

## 2015-05-04 LAB — IRON AND TIBC
Iron: 27 ug/dL — ABNORMAL LOW (ref 28–170)
Saturation Ratios: 10 % — ABNORMAL LOW (ref 10.4–31.8)
TIBC: 258 ug/dL (ref 250–450)
UIBC: 231 ug/dL

## 2015-05-04 LAB — C-REACTIVE PROTEIN: CRP: 3.1 mg/dL — ABNORMAL HIGH (ref <1.0)

## 2015-05-04 LAB — PROTIME-INR
INR: 1.12 (ref 0.00–1.49)
Prothrombin Time: 14.6 seconds (ref 11.6–15.2)

## 2015-05-04 LAB — CBC
HCT: 30.8 % — ABNORMAL LOW (ref 36.0–46.0)
HEMATOCRIT: 30.5 % — AB (ref 36.0–46.0)
Hemoglobin: 9.6 g/dL — ABNORMAL LOW (ref 12.0–15.0)
Hemoglobin: 9.6 g/dL — ABNORMAL LOW (ref 12.0–15.0)
MCH: 29.5 pg (ref 26.0–34.0)
MCH: 29.4 pg (ref 26.0–34.0)
MCHC: 31.5 g/dL (ref 30.0–36.0)
MCHC: 31.2 g/dL (ref 30.0–36.0)
MCV: 93.8 fL (ref 78.0–100.0)
MCV: 94.5 fL (ref 78.0–100.0)
PLATELETS: 64 10*3/uL — AB (ref 150–400)
PLATELETS: 62 10*3/uL — AB (ref 150–400)
RBC: 3.25 MIL/uL — AB (ref 3.87–5.11)
RBC: 3.26 MIL/uL — ABNORMAL LOW (ref 3.87–5.11)
RDW: 16.4 % — ABNORMAL HIGH (ref 11.5–15.5)
RDW: 16.5 % — AB (ref 11.5–15.5)
WBC: 4.1 10*3/uL (ref 4.0–10.5)
WBC: 4.4 10*3/uL (ref 4.0–10.5)

## 2015-05-04 LAB — FOLATE: Folate: 9.1 ng/mL (ref >5.9)

## 2015-05-04 LAB — TYPE AND SCREEN
ABO/RH(D): O POS
ANTIBODY SCREEN: NEGATIVE

## 2015-05-04 LAB — LACTATE DEHYDROGENASE: LDH: 146 U/L (ref 98–192)

## 2015-05-04 LAB — FERRITIN: FERRITIN: 89 ng/mL (ref 11–307)

## 2015-05-04 LAB — SEDIMENTATION RATE: Sed Rate: 63 mm/hr — ABNORMAL HIGH (ref 0–22)

## 2015-05-04 LAB — VITAMIN B12: Vitamin B-12: 436 pg/mL (ref 180–914)

## 2015-05-04 MED ORDER — SODIUM CHLORIDE 0.9 % IV SOLN: Freq: Once | INTRAVENOUS | Status: DC

## 2015-05-04 MED ORDER — POLYETHYLENE GLYCOL 3350 17 G PO PACK
17.0000 g | PACK | ORAL | Status: AC
  Administered 2015-05-04 (×2): 17 g via ORAL
  Filled 2015-05-04: qty 1

## 2015-05-04 MED ORDER — POLYETHYLENE GLYCOL 3350 17 G PO PACK
17.0000 g | PACK | Freq: Once | ORAL | Status: AC
  Administered 2015-05-04: 17 g via ORAL
  Filled 2015-05-04: qty 1

## 2015-05-04 MED ORDER — BISACODYL 5 MG PO TBEC: 10.0000 mg | DELAYED_RELEASE_TABLET | ORAL | Status: DC

## 2015-05-04 MED ORDER — POLYETHYLENE GLYCOL 3350 17 GM/SCOOP PO POWD: 1.0000 | Freq: Once | ORAL | Status: DC

## 2015-05-04 MED ORDER — DIPHENHYDRAMINE HCL 50 MG/ML IJ SOLN
25.0000 mg | Freq: Once | INTRAMUSCULAR | Status: AC
  Administered 2015-05-04: 25 mg via INTRAVENOUS
  Filled 2015-05-04: qty 1

## 2015-05-04 MED ORDER — POLYETHYLENE GLYCOL 3350 17 G PO PACK
17.0000 g | PACK | ORAL | Status: AC
  Administered 2015-05-04 (×3): 17 g via ORAL
  Filled 2015-05-04 (×2): qty 1

## 2015-05-04 MED ORDER — POLYETHYLENE GLYCOL 3350 17 G PO PACK: 17.0000 g | PACK | ORAL | Status: DC

## 2015-05-04 MED ORDER — ACETAMINOPHEN 325 MG PO TABS
650.0000 mg | ORAL_TABLET | Freq: Once | ORAL | Status: AC
  Administered 2015-05-04: 650 mg via ORAL
  Filled 2015-05-04: qty 2

## 2015-05-04 MED ORDER — BISACODYL 5 MG PO TBEC
10.0000 mg | DELAYED_RELEASE_TABLET | ORAL | Status: AC
  Administered 2015-05-04 (×2): 10 mg via ORAL
  Filled 2015-05-04 (×2): qty 2

## 2015-05-04 NOTE — Progress Notes (Signed)
Patient's chart is reviewed in detail.  In summary, patient is admitted secondary to GI bleed and LOC.  She has been evaluated by GI and is scheduled for inpatient colonoscopy for further evaluation.  Hematology is consulted for thrombocytopenia.  The patient is scheduled for colonoscopy tomorrow.  Thrombocytopenia dates back to at least 2013 and prior to that, [name]'s platelet count was low-normal dating back to at least 2009.  Of late, her platelet count has ranged from 60- 100,000.  There is no mention of her CBC for platelet clumping.  Unfortunately, thrombocytopenia work-up will not be completed in time for colonoscopy.  I will order some lab work tonight to work this up peripherally.  On Chart review, the patient has multiple possibilities for thrombocytopenia.    As a result of a lack of available time, hematology would recommend a repeat CBC tomorrow, and platelet transfusion of regular blood products.  The following orders are placed:  1. CBC today in citrated tube  2. Anemia panel, retic count, haptoglobin, EPO level, pathology smear review, LDH, ESR, CRP, SPEP + IFE, immunoglobulins, Hepatitis panel, HIV antibody.  3. CBC diff tomorrow AM  4. 1 unit of pheresed platelets tomorrow prior to colonoscopy with premedications: Tylenol/Benadryl  Thrombocytopenia work-up can take place following GI work-up.  Hematology work-up can either occur as an inpatient or an outpatient based upon hospital course.   Patient and plan discussed with Dr. Ancil Linsey and she is in agreement with the aforementioned.   Kathy Pane, PA-C 05/04/2015 5:19 PM

## 2015-05-04 NOTE — Care Management Important Message (Signed)
Important Message  Patient Details  Name: [name] MRN: [id number] Date of Birth: 28-Jul-1940   Medicare Important Message Given:  Yes    Alvie Heidelberg, RN 05/04/2015, 2:00 PM

## 2015-05-04 NOTE — Progress Notes (Signed)
PROGRESS NOTE  DHRUTI GHUMAN SHF:[id number] DOB: 1941/01/05 DOA: 04/30/2015 PCP: [name]., MD  Summary: 75 year old woman admitted 03/2015 for diarrhea, acute kidney injury, thought to be viral (C. difficile negative, GI pathogen panel negative at that time) with arrangements made for outpatient follow-up who presented to the emergency department 4/13 with recurrent bloody diarrhea; met sepsis criteria on admission, started on empiric antibiotics and admitted for acute colitis. Gradually improving with IV antibiotics with resolution of sepsis and acute kidney injury. Followed by gastroenterology with plans for colonoscopy 4/18.  Assessment/Plan: 1. Sepsis secondary to infectious colitis. Resolved. C difficile and GI pathogen panel negative. CT abdomen and pelvis on admission suggested infectious enteropathy.  2. Acute colitis, presumed infectious. Improving. Blood cultures no growth to date. Continue empiric antibiotics. 3. Acute kidney injury, resolved. 4. Diabetes mellitus type 2, remained stable 5. Anemia of chronic disease appears to be a baseline. Was hemoconcentrated on admission. Remains stable. 6. Chronic thrombocytopenia, etiology unclear. Gastroenterology has requested hematology consultation. 7. PMH lupus, lung cancer s/p radiation 8. Abdominal aortic atherosclerosis with heavy calcification. Incidental finding on abdominal pelvic CT. Suspected significant stenoses common iliac arteries bilaterally. Will arrange outpatient follow-up with vascular surgery.   Slowly improving. Continue empiric antibiotics, Colonoscopy planned tomorrow pending hematology recommendations in regard to thrombocytopenia.   Code Status: DNR DVT prophylaxis: SCDs Family Communication: Discussed with patient Disposition Plan: Discharge once improved  Murray Hodgkins, MD  [facility] Direct contact:  --Via amion app OR  --[email or web address]; password [email or web address] and click  [id number] contact night  coverage as above 05/04/2015, 8:07 AM  LOS: 4 days   Consultants:  none  Procedures:  none  Antibiotics:  Cipro 4/13 >>  Flagyl 4/13 >>  HPI/Subjective: Feels improved today. Denies any vomiting but did have an episode of nausea last night. She reports that her stool has improved.  Objective: Filed Vitals:   05/03/15 0512 05/03/15 1352 05/03/15 2141 05/04/15 0458  BP: 131/60 137/46 149/47 145/53  Pulse: 95 89 92 93  Temp: 97.8 F (36.6 C) 98.4 F (36.9 C) 98.4 F (36.9 C) 98.2 F (36.8 C)  TempSrc: Oral Oral Oral Oral  Resp: _0 Height:      Weight: 69.6 kg (153 lb 7 oz)   68.4 kg (150 lb 12.7 oz)  SpO2: 97% 98% 98% 96%    Intake/Output Summary (Last 24 hours) at 05/04/15 0807 Last data filed at 05/04/15 0459  Gross per 24 hour  Intake 4158.33 ml  Output    357 ml  Net 3801.33 ml     Filed Weights   05/02/15 0830 05/03/15 0512 05/04/15 0458  Weight: 68.9 kg (151 lb 14.4 oz) 69.6 kg (153 lb 7 oz) 68.4 kg (150 lb 12.7 oz)    Exam:  General:  Appears calm and comfortable Cardiovascular: RRR, no m/r/g.  Respiratory: CTA bilaterally, no w/r/r. Normal respiratory effort. Abdomen: soft, ntnd Psychiatric: grossly normal mood and affect, speech fluent and appropriate Neurologic: grossly non-focal.  New data reviewed:  WBC wnl  CBG stable  Hgb 9.6 stable, plat 64 without change  Prothrombin time 14.6, INR 1.12  Scheduled Meds: . bisacodyl  10 mg Oral Q2H  . ciprofloxacin  400 mg Intravenous Q12H  . feeding supplement (GLUCERNA SHAKE)  237 mL Oral BID BM  . insulin aspart  0-9 Units Subcutaneous 6 times per day  . metronidazole  500 mg Intravenous Q8H  . pantoprazole  40 mg Oral QAC breakfast  . sodium chloride  flush  3 mL Intravenous Q12H   Continuous Infusions: . sodium chloride 50 mL/hr at 05/03/15 1238    Principal Problem:   Sepsis ([place]) Active Problems:   Diabetes mellitus, type 2 ([place])   Essential hypertension    Thrombocytopenia, unspecified (HCC)   AKI (acute kidney injury) ([place])   Colitis, acute   Time spent 20 minutes  By signing my name below, I, Delene Ruffini, attest that this documentation has been prepared under the direction and in the presence of Daniel P. Sarajane Jews, MD. Electronically Signed: Delene Ruffini, Scribe.  05/04/2015 12:00pm  I personally performed the services described in this documentation. All medical record entries made by the scribe were at my direction. I have reviewed the chart and agree that the record reflects my personal performance and is accurate and complete. Murray Hodgkins, MD

## 2015-05-04 NOTE — Care Management Note (Signed)
Case Management Note  Patient Details  Name: [name] MRN: [id number] Date of Birth: 1940/09/05  Subjective/Objective:   Patient is alert and orient from home and answers questions appropriately.      Stated that she has a walker and a cane at home, has macular degeneration and does not drive. Lives at homew with her adult son who helps her. House is on one level. Denies issues with medications. No CM need identified at this time will continue to follow.   Action/Plan: Home with self care.   Expected Discharge Date:                  Expected Discharge Plan:  Home/Self Care  In-House Referral:     Discharge planning Services  CM Consult  Post Acute Care Choice:    Choice offered to:     DME Arranged:    DME Agency:     HH Arranged:    HH Agency:     Status of Service:  In process, will continue to follow  Medicare Important Message Given:  Yes Date Medicare IM Given:    Medicare IM give by:    Date Additional Medicare IM Given:    Additional Medicare Important Message give by:     If discussed at [place] of Stay Meetings, dates discussed:    Additional Comments:  Alvie Heidelberg, RN 05/04/2015, 1:57 PM

## 2015-05-04 NOTE — Progress Notes (Signed)
    Subjective: No rectal bleeding. Stool with less diarrhea and more consistency. Will start prep today for colonoscopy tomorrow. No N/V.   Objective: Vital signs in last 24 hours: Temp:  [98.2 F (36.8 C)-98.4 F (36.9 C)] 98.2 F (36.8 C) (04/17 0458) Pulse Rate:  [89-93] 93 (04/17 0458) Resp:  [20] 20 (04/17 0458) BP: (137-149)/(46-53) 145/53 mmHg (04/17 0458) SpO2:  [96 %-98 %] 96 % (04/17 0458) Weight:  [150 lb 12.7 oz (68.4 kg)] 150 lb 12.7 oz (68.4 kg) (04/17 0458) Last BM Date: 05/03/15 General:   Alert and oriented, pleasant Head:  Normocephalic and atraumatic. Abdomen:  Bowel sounds present, soft, non-tender, non-distended. Sitting on side of bed, limited exam Extremities:  Right lower extremity boot  Neurologic:  Alert and  oriented x4 Psych:  Alert and cooperative. Normal mood and affect.  Intake/Output from previous day: 04/16 0701 - 04/17 0700 In: 4278.3 [P.O.:600; I.V.:2678.3; IV Piggyback:1000] Out: 357 [Urine:157; Stool:200] Intake/Output this shift:    Lab Results:  Recent Labs  05/02/15 0445 05/03/15 0627 05/04/15 0530  WBC 8.2 3.6* 4.1  HGB 9.3* 9.2* 9.6*  HCT 30.6* 29.8* 30.5*  PLT 73* 58* 64*   BMET  Recent Labs  05/02/15 0445  NA 139  K 3.9  CL 115*  CO2 18*  GLUCOSE 137*  BUN 10  CREATININE 0.80  CALCIUM 7.5*   PT/INR  Recent Labs  05/04/15 0530  LABPROT 14.6  INR 1.12    Assessment: 75 year old female admitted with bloody diarrhea, negative GI pathogen panel, and colonoscopy planned for 4/18 with Dr. Oneida Alar. Hematology consultation requested due to thrombocytopenia and evaluate if  platelets prior to colonoscopy would be appropriate. Imaging with normal liver and spleen. No further rectal bleeding. Stool consistency improved.   Plan: Full liquids today Colonoscopy with Dr. Oneida Alar on 4/18 Miralax prep today starting at noon for 3 doses but may need additional dosing. Will evaluate after 3rd dose. Tap water enema X 2 on  4/18  Hematology consult today for recommendations regarding possible platelets needed prior to procedure  Orvil Feil, ANP-BC [facility]     LOS: 4 days    05/04/2015, 8:06 AM   Attending note:  Patient seen and examined. No further recommendations.

## 2015-05-05 ENCOUNTER — Other Ambulatory Visit: Payer: Self-pay

## 2015-05-05 ENCOUNTER — Telehealth: Payer: Self-pay | Admitting: Gastroenterology

## 2015-05-05 ENCOUNTER — Encounter (HOSPITAL_COMMUNITY)
Admission: EM | Disposition: A | Discharge status: Home / Self Care | Payer: Self-pay | Source: Home / Self Care | Attending: Family Medicine

## 2015-05-05 ENCOUNTER — Encounter (HOSPITAL_COMMUNITY): Payer: Self-pay | Admitting: *Deleted

## 2015-05-05 DIAGNOSIS — R197 Diarrhea, unspecified: Secondary | ICD-10-CM

## 2015-05-05 DIAGNOSIS — K5730 Diverticulosis of large intestine without perforation or abscess without bleeding: Secondary | ICD-10-CM

## 2015-05-05 DIAGNOSIS — R1084 Generalized abdominal pain: Secondary | ICD-10-CM

## 2015-05-05 DIAGNOSIS — K921 Melena: Secondary | ICD-10-CM

## 2015-05-05 HISTORY — PX: COLONOSCOPY: SHX5424

## 2015-05-05 LAB — PROTEIN ELECTROPHORESIS, SERUM
A/G RATIO SPE: 0.9 (ref 0.7–1.7)
ALBUMIN ELP: 3.1 g/dL (ref 2.9–4.4)
ALPHA-1-GLOBULIN: 0.3 g/dL (ref 0.0–0.4)
Alpha-2-Globulin: 0.7 g/dL (ref 0.4–1.0)
Beta Globulin: 0.9 g/dL (ref 0.7–1.3)
GLOBULIN, TOTAL: 3.4 g/dL (ref 2.2–3.9)
Gamma Globulin: 1.6 g/dL (ref 0.4–1.8)
TOTAL PROTEIN ELP: 6.5 g/dL (ref 6.0–8.5)

## 2015-05-05 LAB — CBC WITH DIFFERENTIAL/PLATELET
BASOS ABS: 0 10*3/uL (ref 0.0–0.1)
BASOS PCT: 0 %
EOS PCT: 1 %
Eosinophils Absolute: 0.1 10*3/uL (ref 0.0–0.7)
HCT: 30.7 % — ABNORMAL LOW (ref 36.0–46.0)
Hemoglobin: 9.6 g/dL — ABNORMAL LOW (ref 12.0–15.0)
LYMPHS PCT: 18 %
Lymphs Abs: 0.7 10*3/uL (ref 0.7–4.0)
MCH: 29.5 pg (ref 26.0–34.0)
MCHC: 31.3 g/dL (ref 30.0–36.0)
MCV: 94.5 fL (ref 78.0–100.0)
Monocytes Absolute: 1.2 10*3/uL — ABNORMAL HIGH (ref 0.1–1.0)
Monocytes Relative: 31 %
NEUTROS ABS: 1.9 10*3/uL (ref 1.7–7.7)
Neutrophils Relative %: 50 %
PLATELETS: 88 10*3/uL — AB (ref 150–400)
RBC: 3.25 MIL/uL — AB (ref 3.87–5.11)
RDW: 16.7 % — AB (ref 11.5–15.5)
WBC: 3.8 10*3/uL — AB (ref 4.0–10.5)

## 2015-05-05 LAB — CULTURE, BLOOD (ROUTINE X 2): CULTURE: NO GROWTH

## 2015-05-05 LAB — PREPARE PLATELET PHERESIS: UNIT DIVISION: 0

## 2015-05-05 LAB — IGG, IGA, IGM
IGA: 319 mg/dL (ref 64–422)
IGG (IMMUNOGLOBIN G), SERUM: 1439 mg/dL (ref 700–1600)
IgM, Serum: 145 mg/dL (ref 26–217)

## 2015-05-05 LAB — GLUCOSE, CAPILLARY
GLUCOSE-CAPILLARY: 108 mg/dL — AB (ref 65–99)
GLUCOSE-CAPILLARY: 146 mg/dL — AB (ref 65–99)
Glucose-Capillary: 132 mg/dL — ABNORMAL HIGH (ref 65–99)
Glucose-Capillary: 163 mg/dL — ABNORMAL HIGH (ref 65–99)
Glucose-Capillary: 80 mg/dL (ref 65–99)

## 2015-05-05 LAB — HIV ANTIBODY (ROUTINE TESTING W REFLEX): HIV SCREEN 4TH GENERATION: NONREACTIVE

## 2015-05-05 LAB — HEPATITIS PANEL, ACUTE
HCV AB: 0.2 {s_co_ratio} (ref 0.0–0.9)
HEP B S AG: NEGATIVE
Hep A IgM: NEGATIVE
Hep B C IgM: NEGATIVE

## 2015-05-05 LAB — HAPTOGLOBIN: Haptoglobin: 191 mg/dL (ref 34–200)

## 2015-05-05 LAB — ERYTHROPOIETIN: Erythropoietin: 62.7 m[IU]/mL — ABNORMAL HIGH (ref 2.6–18.5)

## 2015-05-05 SURGERY — COLONOSCOPY
Anesthesia: Moderate Sedation

## 2015-05-05 MED ORDER — SODIUM CHLORIDE 0.9 % IV SOLN
INTRAVENOUS | Status: DC
  Administered 2015-05-05: 12:00:00 via INTRAVENOUS

## 2015-05-05 MED ORDER — CALCIUM POLYCARBOPHIL 625 MG PO TABS
1250.0000 mg | ORAL_TABLET | Freq: Every day | ORAL | Status: DC
  Administered 2015-05-05: 1250 mg via ORAL
  Filled 2015-05-05: qty 2

## 2015-05-05 MED ORDER — MIDAZOLAM HCL 5 MG/5ML IJ SOLN
INTRAMUSCULAR | Status: DC | PRN
  Administered 2015-05-05 (×3): 2 mg via INTRAVENOUS
  Administered 2015-05-05: 1 mg via INTRAVENOUS

## 2015-05-05 MED ORDER — MEPERIDINE HCL 100 MG/ML IJ SOLN
INTRAMUSCULAR | Status: DC | PRN
  Administered 2015-05-05 (×3): 25 mg via INTRAVENOUS

## 2015-05-05 MED ORDER — ASPIRIN EC 81 MG PO TBEC: 81.0000 mg | DELAYED_RELEASE_TABLET | Freq: Every day | ORAL | Status: DC

## 2015-05-05 MED ORDER — CALCIUM POLYCARBOPHIL 625 MG PO TABS: 1250.0000 mg | ORAL_TABLET | Freq: Every day | ORAL | Status: DC

## 2015-05-05 MED ORDER — STERILE WATER FOR IRRIGATION IR SOLN
Status: DC | PRN
  Administered 2015-05-05: 13:00:00

## 2015-05-05 MED ORDER — MIDAZOLAM HCL 5 MG/5ML IJ SOLN
INTRAMUSCULAR | Status: AC
  Filled 2015-05-05: qty 10

## 2015-05-05 MED ORDER — MEPERIDINE HCL 100 MG/ML IJ SOLN
INTRAMUSCULAR | Status: AC
  Filled 2015-05-05: qty 2

## 2015-05-05 NOTE — Progress Notes (Signed)
PROGRESS NOTE  [name] FXO:[id number] DOB: 30-Dec-1940 DOA: 04/30/2015 PCP: [name]., MD  Summary: 75 year old woman admitted 03/2015 for diarrhea, acute kidney injury, thought to be viral (C. difficile negative, GI pathogen panel negative at that time) with arrangements made for outpatient follow-up who presented to the emergency department 4/13 with recurrent bloody diarrhea; met sepsis criteria on admission, started on empiric antibiotics and admitted for acute colitis. Gradually improving with IV antibiotics with resolution of sepsis and acute kidney injury. Followed by gastroenterology. Status post colonoscopy 4/18.  Assessment/Plan: 1. Sepsis secondary to infectious colitis. Resolved. C difficile and GI pathogen panel negative. CT abdomen and pelvis on admission suggested infectious etiology. 2. Acute infectious colitis. Resolving. Blood cultures no growth to date. Continue abx. Colonoscopy was unrevealing in regard to bloody diarrhea. 3. Acute kidney injury, resolved. 4. Diabetes mellitus type 2, remains stable. 5. Anemia of chronic disease likely at baseline. Was hemoconcentrated on admission.  6. Chronic thrombocytopenia, etiology unclear hematology will follow-up as an outpatient. 7. PMH lupus, lung cancer s/p radiation 8. Abdominal aortic atherosclerosis with heavy calcification. Incidental finding on abdominal pelvic CT. Suspected significant stenoses common iliac arteries bilaterally. Gastroenterology plans outpatient CT angiogram of the abdomen and pelvis. Consider outpatient vascular referral after these results available.   Continues to improve slowly. Colonoscopy unrevealing.   Plan home today, completing antibiotics  Outpatient follow-up with GI, CT imaging plan.   Code Status: DNR DVT prophylaxis: SCDs Family Communication: Discussed with patient Disposition Plan: Discharge within 24-48 hours.   Murray Hodgkins, MD  [facility] Direct contact:   --Via amion app OR  --[email or web address]; password [email or web address] and click  [id number] contact night coverage as above 05/05/2015, 7:36 AM  LOS: 5 days   Consultants:  GI  Procedures:  Colonoscopy 4/18  Impression: - Non-thrombosed external hemorrhoids found on   digital rectal exam.  - The examined portion of the ileum was normal.  - Diverticulosis in the sigmoid colon.  - Non-bleeding internal hemorrhoids.  NO OBVIOUS SOURCE FOR BLOODY DIARRHEA IDENTIFIED.  Transfusion platelets 4/17  Antibiotics:  Cipro 4/13 >> 4/18  Flagyl 4/13 >> 4/18  HPI/Subjective: Still feels generally weak. No reports of chest pain, shortness of breath, n/v/d, or abd pain.  Objective: Filed Vitals:   05/04/15 2323 05/04/15 2345 05/05/15 0105 05/05/15 0441  BP: 113/95 141/42 120/83 124/89  Pulse: 93 60 71 81  Temp: 98.6 F (37 C) 98.6 F (37 C) 98.6 F (37 C) 98.8 F (37.1 C)  TempSrc: Oral Oral Oral Oral  Resp: _0 Height:      Weight:    68.4 kg (150 lb 12.7 oz)  SpO2: 99% 98% 99% 98%    Intake/Output Summary (Last 24 hours) at 05/05/15 0736 Last data filed at 05/05/15 0219  Gross per 24 hour  Intake    561 ml  Output     17 ml  Net    544 ml     Filed Weights   05/03/15 0512 05/04/15 0458 05/05/15 0441  Weight: 69.6 kg (153 lb 7 oz) 68.4 kg (150 lb 12.7 oz) 68.4 kg (150 lb 12.7 oz)    Exam: Afebrile, VSS, not hypoxic  Constitutional:  . Appears calm and comfortable Respiratory:  . CTA bilaterally, no w/r/r.  . Respiratory effort normal. No retractions or accessory muscle use Cardiovascular:  . RRR, no m/r/g Abdomen:  . Abdomen appears normal . Mild lower mid abdominal pain with palpation   New data reviewed:  WBC 3.8  Hgb 9.6, stable  Platelets 88, improved  Scheduled Meds: . sodium chloride   Intravenous Once  .  ciprofloxacin  400 mg Intravenous Q12H  . feeding supplement (GLUCERNA SHAKE)  237 mL Oral BID BM  . insulin aspart  0-9 Units Subcutaneous 6 times per day  . metronidazole  500 mg Intravenous Q8H  . pantoprazole  40 mg Oral QAC breakfast  . sodium chloride flush  3 mL Intravenous Q12H   Continuous Infusions: . sodium chloride 50 mL/hr at 05/04/15 1329    Principal Problem:   Sepsis ([place]) Active Problems:   Diabetes mellitus, type 2 (HCC)   Essential hypertension   Thrombocytopenia, unspecified (HCC)   AKI (acute kidney injury) ([place])   Colitis, acute   By signing my name below, I, Rennis Harding attest that this documentation has been prepared under the direction and in the presence of Murray Hodgkins, MD Electronically signed: Rennis Harding 05/05/2015 10:18am  I personally performed the services described in this documentation. All medical record entries made by the scribe were at my direction. I have reviewed the chart and agree that the record reflects my personal performance and is accurate and complete. Murray Hodgkins, MD

## 2015-05-05 NOTE — Progress Notes (Signed)
Patient discharged to home. IV removed and site is clean, dry and intact. Discharge instructions reviewed with patient and son Richardson Landry). Both verbalized understanding of physician follow up instructions, medications and discharge instructions. No additional questions and patient stable at discharge. Patient taken to main entrance via wheelchair by NT.

## 2015-05-05 NOTE — Progress Notes (Signed)
Patient completed prep for her colonoscopy, output clear. Platelets received overnight. Platelets improved to 88. For colonoscopy today. Appreciate Hematology input.  Orvil Feil, ANP-BC [facility]

## 2015-05-05 NOTE — Telephone Encounter (Signed)
Called pt and LMOM with CTA appt.   Pt is set for CTA on 05/19/2015 @ 0930am

## 2015-05-05 NOTE — Telephone Encounter (Signed)
PT NEEDS CT ANGIOGRAM OF ABDOMEN/PLEVIS IN 2 WEEKS, Rx: POSTPRANDIAL ABDOMINAL PAIN, BLOODY DIARRHEA. EVALUATE FOR STENOSIS AT CELIAC, SMA, AND IMA AXIS.

## 2015-05-05 NOTE — Op Note (Addendum)
[facility] Patient Name: [name] Procedure Date: 05/05/2015 1:05 PM MRN: [id number] Date of Birth: 02-23-1940 Attending MD: Barney Drain , MD CSN: [id number] Age: 75 Admit Type: Inpatient Procedure:                Colonoscopy Indications:              Hematochezia, Clinically significant diarrhea of                            unexplained origin Providers:                Barney Drain, MD, Lurline Del, RN, Georgeann Oppenheim,                            Technician Referring MD:             Kerin Perna M.D. , MD Medicines:                Meperidine 75 mg IV, Midazolam 7 mg IV Complications:            No immediate complications. Estimated Blood Loss:     Estimated blood loss was minimal. Estimated blood                            loss was minimal. Procedure:                Pre-Anesthesia Assessment:                           - Prior to the procedure, a History and Physical                            was performed, and patient medications and                            allergies were reviewed. The patient's tolerance of                            previous anesthesia was also reviewed. The risks                            and benefits of the procedure and the sedation                            options and risks were discussed with the patient.                            All questions were answered, and informed consent                            was obtained. Prior Anticoagulants: The patient has                            taken no previous anticoagulant or antiplatelet  agents. ASA Grade Assessment: III - A patient with                            severe systemic disease. After reviewing the risks                            and benefits, the patient was deemed in                            satisfactory condition to undergo the procedure.                           After obtaining informed consent, the colonoscope                            was passed under  direct vision. Throughout the                            procedure, the patient's blood pressure, pulse, and                            oxygen saturations were monitored continuously. The                            EC-[id number] ([id number]) scope was introduced through                            the anus and advanced to the the terminal ileum.                            The terminal ileum, ileocecal valve, appendiceal                            orifice, and rectum were photographed. The                            colonoscopy was somewhat difficult due to a                            tortuous colon. Successful completion of the                            procedure was aided by changing the patient to a                            supine position and using manual pressure. The                            quality of the bowel preparation was excellent. The                            terminal ileum, ileocecal valve, appendiceal  orifice, and rectum were photographed. Scope In: 1:24:34 PM Scope Out: 1:54:24 PM Scope Withdrawal Time: 0 hours 20 minutes 55 seconds  Total Procedure Duration: 0 hours 29 minutes 50 seconds  Findings:      The digital rectal exam findings include non-thrombosed external       hemorrhoids.      The terminal ileum appeared normal.      A few diverticula were found in the sigmoid colon.      Non-bleeding internal hemorrhoids were found.      Biopsies for histology were taken with a cold forceps from the cecum,       ascending colon and right transverse colon for evaluation of microscopic       colitis.      A 3 mm polyp was found in the sigmoid colon. The polyp was sessile. The       polyp was removed with a cold biopsy forceps. Resection and retrieval       were complete. Impression:               - Non-thrombosed external hemorrhoids found on                            digital rectal exam.                           - The examined portion of the  ileum was normal.                           - Diverticulosis in the sigmoid colon.                           - Non-bleeding internal hemorrhoids.                           NO OBVIOUS SOURCE FOR BLOODY DIARRHEA IDENTIFIED. Moderate Sedation:      Moderate (conscious) sedation was administered by the endoscopy nurse       and supervised by the endoscopist. The following parameters were       monitored: oxygen saturation, heart rate, blood pressure, and response       to care. Total physician intraservice time was 45 minutes. Recommendation:           - Patient has a contact number available for                            emergencies. The signs and symptoms of potential                            delayed complications were discussed with the                            patient. Return to normal activities tomorrow.                            Written discharge instructions were provided to the                            patient.                           -  High fiber diet.                           - Continue present medications.                           - Await pathology results.                           - Repeat colonoscopy for surveillance based on                            pathology results.                           - Return to GI office in 2 months.                           NEEDS CTA IN 3 WEEKS.                           STOP CIPRO/FLAGYL Procedure Code(s):        --- Professional ---                           [phone], Colonoscopy, flexible; with biopsy, single                            or multiple                           99153, Moderate sedation services; each additional                            15 minutes intraservice time                           99153, Moderate sedation services; each additional                            15 minutes intraservice time                           G0500, Moderate sedation services provided by the                            same physician or  other qualified health care                            professional performing a gastrointestinal                            endoscopic service that sedation supports,                            requiring the presence of an independent trained  observer to assist in the monitoring of the                            patient's level of consciousness and physiological                            status; initial 15 minutes of intra-service time;                            patient age 6 years or older (additional time may                            be reported with [phone], as appropriate) Diagnosis Code(s):        --- Professional ---                           K64.4, Residual hemorrhoidal skin tags                           K64.8, Other hemorrhoids                           K92.1, Melena (includes Hematochezia)                           R19.7, Diarrhea, unspecified                           K57.30, Diverticulosis of large intestine without                            perforation or abscess without bleeding CPT copyright 2016 American Medical Association. All rights reserved. The codes documented in this report are preliminary and upon coder review may  be revised to meet current compliance requirements. Barney Drain, MD Barney Drain, MD 05/05/2015 3:12:55 PM This report has been signed electronically. Number of Addenda: 0

## 2015-05-05 NOTE — Consult Note (Addendum)
Patient's chart was reviewed this afternoon in detail in preparation for formal consultation for thrombocytopenia.  She is noted to have rheumatoid arthritis and is currently under treatment with methotrexate according to available records to me.  Additionally, she recently completed SBRT by Dr. Lisbeth Renshaw to a right and left pulmonary nodule suspicious for primary bronchogenic carcinoma with hypermetabolic activity on PET imaging.  She received 54 Gy in 3 fractions to the right lung nodule and 50 Gy in 5 fractions to the left lung nodule.  She is now followed by Dr. Audree Camel (medical oncology) at [facility].    She presented to [facility] ED for GI bleed and LOC.  She underwent colonoscopy by Dr. Oneida Alar today, 05/05/2015 that was unimpressive without obvious source for bleeding.    Case discussed with Dr. Sarajane Jews, attending hospitalist.  Given that she is stable for discharge today,and that she is an established patient of Dr. Jacquiline Doe, she should follow-up with him in the outpatient setting.  Her thrombocytopenia dates back to 2013 and has been relatively stable over the years.  She does have a normocytic, normochromic anemia dating back to 2013 with preservation of her WBC (excluding current hospital stay with only minimal and infrequent leukocytosis).    As mentioned above, she does have RA and is on treatment with methotrexate.  This could certainly contribute to thrombocytopenia and/or anemia.  No obvious chronic renal disease on my review of labs and therefore anemia of chronic renal disease is unlikely.  A source of bleeding was not identified on colonoscopy and therefore thrombocytopenia secondary to consumption is unlikely.  Given her stability of lab abnormality over the last 3-4 years, outpatient work-up for anemia and thrombocytopenia would be most cost-effective and appropriate for the patient.  Dr. Reynaldo Minium last noted from April 2017 is reviewed in [facility] and  it is documented that her WBC is normal, minimal anemia at 11.2 g/dL, and platelet count of 80,000.  She is scheduled for follow-up according to his note for repeat imaging and follow-up appointment in July 2017.  Again, her counts are stable and July 2017 follow-up is reasonable, but being seen sooner may be beneficial to expedite work-up of anemia and thrombocytopenia.  Prior to knowing that she had a medical oncologist, I performed a peripheral work-up for lab abnormalities.  They are as follows:    CBC    Component Value Date/Time   WBC 3.8* 05/05/2015 0623   RBC 3.25* 05/05/2015 0623   RBC 3.37* 05/04/2015 1758   HGB 9.6* 05/05/2015 0623   HCT 30.7* 05/05/2015 0623   PLT 88* 05/05/2015 0623   MCV 94.5 05/05/2015 0623   MCH 29.5 05/05/2015 0623   MCHC 31.3 05/05/2015 0623   RDW 16.7* 05/05/2015 0623   LYMPHSABS 0.7 05/05/2015 0623   MONOABS 1.2* 05/05/2015 0623   EOSABS 0.1 05/05/2015 0623   BASOSABS 0.0 05/05/2015 0623    .lastchem Lab Results  Component Value Date   IRON 27* 05/04/2015   TIBC 258 05/04/2015   FERRITIN 89 05/04/2015   Lab Results  Component Value Date   VITAMINB12 436 05/04/2015   Lab Results  Component Value Date   FOLATE 9.1 05/04/2015   Lab Results  Component Value Date   RETICCTPCT 3.5* 05/04/2015   Results for KASSI, ESTEVE (MRN [id number]) as of 05/05/2015 16:44  Ref. Range 05/04/2015 17:58  Erythropoietin Latest Ref Range: 2.6-18.5 mIU/mL 62.7 (H)   Results for CHASSITY, LUDKE (MRN [id number]) as of  05/05/2015 16:44  Ref. Range 05/04/2015 17:58  Haptoglobin Latest Ref Range: 34-200 mg/dL 191    Lab Results  Component Value Date   ESRSEDRATE 63* 05/04/2015   Lab Results  Component Value Date   CRP 3.1* 05/04/2015   Lab Results  Component Value Date   PROT 8.2* 04/30/2015   ALBUMINELP 3.1 05/04/2015   A1GS 0.3 05/04/2015   A2GS 0.7 05/04/2015   BETS 0.9 05/04/2015   GAMS 1.6 05/04/2015   MSPIKE Not Observed 05/04/2015    SPEI Comment 05/04/2015   SPECOM Comment 05/04/2015   IGGSERUM 1439 05/04/2015   IGA 319 05/04/2015   IGMSERUM 145 05/04/2015    Results for SHAKENYA, STONEBERG (MRN [id number]) as of 05/05/2015 16:44  Ref. Range 05/04/2015 17:58  IgG (Immunoglobin G), Serum Latest Ref Range: [phone] mg/dL 1439  IgA Latest Ref Range: 64-422 mg/dL 319  IgM, Serum Latest Ref Range: 26-217 mg/dL 145     Results for IKIA, CINCOTTA (MRN [id number]) as of 05/05/2015 16:44  Ref. Range 05/04/2015 17:58  Hep A Ab, IgM Latest Ref Range: Negative  Negative  Hepatitis B Surface Ag Latest Ref Range: Negative  Negative  Hep B Core Ab, IgM Latest Ref Range: Negative  Negative  HCV Ab Latest Ref Range: 0.0-0.9 s/co ratio 0.2   Results for JAKEIRA, SEEMAN (MRN [id number]) as of 05/05/2015 16:44  Ref. Range 05/04/2015 17:58  LDH Latest Ref Range: 98-192 U/L 146    Of note, immunofixation is still pending.  Given elevated ESR and particularly elevated CRP without any other frank abnormalities except for unimpressive retic count and EPO level, I wonder if her lab abnormalities are associated with chronic disease, namely RA (+/- its treatment).  I will defer further work-up and management for Dr. Jacquiline Doe.  Patient not seen today.  KEFALAS,THOMAS, PA-C 05/05/2015 5:00 PM

## 2015-05-05 NOTE — Interval H&P Note (Signed)
History and Physical Interval Note:  05/05/2015 12:49 PM  [name]  has presented today for surgery, with the diagnosis of bloody diarrhea  The various methods of treatment have been discussed with the patient and family. After consideration of risks, benefits and other options for treatment, the patient has consented to  Procedure(s): COLONOSCOPY (N/A) as a surgical intervention .  The patient's history has been reviewed, patient examined, no change in status, stable for surgery.  I have reviewed the patient's chart and labs.  Questions were answered to the patient's satisfaction.     [employer]

## 2015-05-05 NOTE — H&P (View-Only) (Signed)
Referring Provider: No ref. provider found Primary Care Physician:  [name]., MD Primary Gastroenterologist:  DR. Laural Golden  Reason for Consultation:  RECTAL BLEEDING   Impression: ADMITTED WITH SUDDEN ONSET OF ABDOMINAL PAIN/VOMITING/DIARRHEA DUE TO INFECTIOUS V. ISCHEMIC COLITIS, DOUBT ACUTE MESENTERIC ISCHEMIA. PT CLINICALLY IMPROVED.   Plan: 1. CONTINUE TO MONITOR SYMPTOMS: ACUTE ABDOMEN DUE TO MESENTERIC OCCLUSION/INFARCT 2. AGGRESSIVE HYDRATION 3. MONITOR Hb AND CO2. 4. Plan for TCS PRIOR TO DISCHARGE. 5. CONTINUE CIP/FLAGYL 6. PROTONIX DAILY 7. AWAIT STOOL PANEL       HPI:  Was in hospital 4 weeks and had brbpr. HAD CONSTIPATION DUE TO OXYCODONE. Was d/c and didn't have diarrhea. SAW TERRI SETZER IN [facility] AND WAS SCHEDULED FOR TCS IN JUL 2017. WAS IN HER USUAL STATE OF HEALTH. HAD PT. ATE FOOD FROM [employer] AND 1 HR LATER THREW UP/ABDOMINAL PAIN/SEVERAL WATERY STOOLS WITH BLOOD. SUBJECTIVE FEVER/CHILLS. C DIFF NEGATIVE. BLOODY DIARRHEA STARTED YESTERDAY. MILD NAUSEA BUT NO MORE VOMITING. SHARP RLQ ABDOMINAL PAIN. LAST TCS 2008. LAST EGD   PT DENIES FEVER, CHILLS, HEMATOCHEZIA, HEMATEMESIS, nausea, vomiting, melena, diarrhea, CHEST PAIN, SHORTNESS OF BREATH,  CHANGE IN BOWEL IN HABITS,  problems swallowing, problems with sedation, OR heartburn or indigestion.    Past Medical History  Diagnosis Date  . Diabetes mellitus     x 20 yrs  . Lupus ([place])     sle  . Fibromyalgia   . Thrombocytopenia, unspecified ([place]) 09/04/2013  . Lung cancer ([place]) 01/28/2015    left upper lobe lung /right upper lobe lung  . Fibromyalgia   . RA (rheumatoid arthritis) ([place])     ra  . Tobacco use   . Peripheral neuropathy [facility])     Past Surgical History  Procedure Laterality Date  . Appendectomy    . Cesarean section  x3  . Oophorectomy    . Cataract extraction w/ intraocular lens implant  2010    right eye  . Abdominal hysterectomy    . Left arm surgery      multiple  surgeries on  . Trigger finger release  yrs ago    right hand  . Knee arthroscopy  yrs ago    left knee  . Shoulder hemi-arthroplasty  01/27/2011    Procedure: SHOULDER HEMI-ARTHROPLASTY;  Surgeon: Nita Sells, MD;  Location: [facility] ORS;  Service: Orthopedics;  Laterality: Right;  interscaline right shoulder    Prior to Admission medications   Medication Sig Start Date End Date Taking? Authorizing Provider  albuterol (PROVENTIL HFA;VENTOLIN HFA) 108 (90 BASE) MCG/ACT inhaler Inhale 2 puffs into the lungs every 6 (six) hours as needed for wheezing or shortness of breath. Please instruct in usage. 09/06/13  Yes Samuella Cota, MD  alendronate (FOSAMAX) 70 MG tablet Take 1 tablet by mouth Once a week. On Sunday 04/15/11  Yes Historical Provider, MD  aspirin EC 81 MG tablet Take 81 mg by mouth 2 (two) times daily.    Yes Historical Provider, MD  cholecalciferol (VITAMIN D) 1000 UNITS tablet Take 1,000 Units by mouth daily.    Yes Historical Provider, MD  folic acid (FOLVITE) 1 MG tablet Take 1 mg by mouth daily.   Yes Historical Provider, MD  methotrexate 2.5 MG tablet Take 7.5 mg by mouth 2 (two) times a week.   Yes Historical Provider, MD  metoprolol tartrate (LOPRESSOR) 25 MG tablet Take 25 mg by mouth daily.    Yes Historical Provider, MD  Multiple Vitamins-Minerals (PRESERVISION AREDS 2) CAPS Take 2 capsules  by mouth 2 (two) times daily.   Yes Historical Provider, MD  naproxen sodium (ANAPROX) 220 MG tablet Take 220-440 mg by mouth daily as needed (for pain).    Yes Historical Provider, MD  oxyCODONE-acetaminophen (PERCOCET/ROXICET) 5-325 MG tablet Take 1 tablet by mouth every 6 (six) hours as needed for moderate pain. 04/02/15  Yes Samuella Cota, MD  pantoprazole (PROTONIX) 40 MG tablet Take 1 tablet (40 mg total) by mouth daily before breakfast. 04/02/15  Yes Samuella Cota, MD  tetrahydrozoline-zinc (VISINE-AC) 0.05-0.25 % ophthalmic solution Place 2 drops into both eyes daily  as needed (dry eyes).   Yes Historical Provider, MD  cefUROXime (CEFTIN) 500 MG tablet Take 1 tablet (500 mg total) by mouth 2 (two) times daily with a meal. Patient not taking: Reported on 04/30/2015 04/02/15   Samuella Cota, MD  polyethylene glycol-electrolytes (NULYTELY/GOLYTELY) 420 g solution Take 4,000 mLs by mouth once. 04/22/15   Butch Penny, NP    Current Facility-Administered Medications  Medication Dose Route Frequency Provider Last Rate Last Dose  . 0.9 %  sodium chloride infusion   Intravenous Continuous Phillips Grout, MD 100 mL/hr at 05/01/15 0157    . ciprofloxacin (CIPRO) IVPB 400 mg  400 mg Intravenous Q12H Phillips Grout, MD      . feeding supplement (ENSURE ENLIVE) (ENSURE ENLIVE) liquid 237 mL  237 mL Oral BID BM Rachal Rayvon Char, MD      . insulin aspart (novoLOG) injection 0-9 Units  0-9 Units Subcutaneous 6 times per day Phillips Grout, MD   1 Units at 05/01/15 [phone]  . metroNIDAZOLE (FLAGYL) IVPB 500 mg  500 mg Intravenous Q8H Phillips Grout, MD   500 mg at 05/01/15 3875  . ondansetron (ZOFRAN) tablet 4 mg  4 mg Oral Q6H PRN Phillips Grout, MD       Or  . ondansetron [facility]) injection 4 mg  4 mg Intravenous Q6H PRN Phillips Grout, MD   4 mg at 05/01/15 0159  . sodium chloride flush (NS) 0.9 % injection 3 mL  3 mL Intravenous Q12H Phillips Grout, MD   3 mL at 05/01/15 0158   Facility-Administered Medications Ordered in Other Encounters  Medication Dose Route Frequency Provider Last Rate Last Dose  . 0.9 %  sodium chloride infusion   Intravenous Continuous Hurley Cisco, MD 20 mL/hr at 10/04/10 1339      Allergies as of 04/30/2015 - Review Complete 04/30/2015  Allergen Reaction Noted  . Bupropion Nausea Only 02/20/2015  . Lopid  [gemfibrozil] Nausea Only 02/20/2015    Family History  Problem Relation Age of Onset  .   NO COLON CA OR POLYP   Social History   Social History  . Marital Status: Widowed    Spouse Name: N/A  . Number of Children: N/A  .  Years of Education: N/A   Occupational History  . Not on file.   Social History Main Topics  . Smoking status: Current Every Day Smoker -- 1.50 packs/day for 40 years    Types: Cigarettes  . Smokeless tobacco: Not on file     Comment: 2 packs a day x 30 yrs  . Alcohol Use: No  . Drug Use: No  . 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   Review of Systems: PER HPI OTHERWISE ALL SYSTEMS ARE NEGATIVE.   Vitals: Blood pressure 88/56, pulse 99, temperature 98.2 F (36.8  C), temperature source Oral, resp. rate 22, height '5\' 5"'$  (1.651 m), weight 154 lb 15.7 oz (70.3 kg), SpO2 97 %.  Physical Exam: General:   Alert,  Well-developed, well-nourished, pleasant and cooperative in NAD Head:  Normocephalic and atraumatic. Eyes:  Sclera clear, no icterus.   Conjunctiva pink. Mouth:  No lesions Neck:  Supple; no masses. Lungs:  Clear throughout to auscultation.   No wheezes. No acute distress. Heart:  Regular rate and IRREGULAR rhythm; murmur PRESENT. Abdomen:  Soft, MILDLY tender IN RLQ and nondistended. No masses noted. Normal bowel sounds, without guarding, and without rebound.   Msk:  Symmetrical without gross deformities. Normal posture. Extremities:  Without edema. Neurologic:  Alert and  oriented x4;  NO  NEW FOCAL DEFICITS Cervical Nodes:  No significant cervical adenopathy. Psych:  Alert and cooperative. Normal mood and affect.  Lab Results:  Recent Labs  04/30/15 2030 04/30/15 2045 05/01/15 0331  WBC 40.8*  --  23.7*  HGB 14.0 17.0* 10.7*  HCT 44.9 50.0* 33.9*  PLT 101*  --  66*   BMET  Recent Labs  04/30/15 2030 04/30/15 2045 05/01/15 0331  NA 136 141 139  K 3.9 3.9 3.9  CL 101 103 111  CO2 22  --  20*  GLUCOSE 271* 269* 186*  BUN '18 18 15  '$ CREATININE 1.41* 1.30* 1.18*  CALCIUM 8.7*  --  7.2*   LFT  Recent Labs  04/30/15 2030  PROT 8.2*  ALBUMIN 3.8  AST 28  ALT 14  ALKPHOS 120  BILITOT 0.9   C  DIFF PCR: NEGATIVE  Studies/Results: CT ABD/PELVIS APR 13-PLAQUE/STENOSIS AT SMA/CELIAC, DILATED SMALL BOWEL/COLON   LOS: 1 day   Trinh Sanjose  05/01/2015, 8:52 AM

## 2015-05-05 NOTE — Care Management Note (Signed)
Case Management Note  Patient Details  Name: [name] MRN: [id number] Date of Birth: [birth year]  Expected Discharge Date:    05/05/2015               Expected Discharge Plan:  Home/Self Care  In-House Referral:     Discharge planning Services  CM Consult  Post Acute Care Choice:    Choice offered to:     DME Arranged:    DME Agency:     HH Arranged:    [place] Agency:     Status of Service:  Completed, signed off  Medicare Important Message Given:  Yes Date Medicare IM Given:    Medicare IM give by:    Date Additional Medicare IM Given:    Additional Medicare Important Message give by:     If discussed at [place] of Stay Meetings, dates discussed:  05/05/2015  Additional Comments: Potential DC home today after TCS. No CM needs.  Sherald Barge, RN 05/05/2015, 1:25 PM

## 2015-05-05 NOTE — Discharge Summary (Signed)
Physician Discharge Summary  [name] BJS:[id number] DOB: [birth year] DOA: 04/30/2015  PCP: [name]., MD  Admit date: 04/30/2015 Discharge date: 05/05/2015  Recommendations for Outpatient Follow-up:  1. Follow up with gastroenterology as an outpatient for further evaluation of recurrent colitis, outpatient plans for CT angiogram to further assess vasculature    Follow-up Information    Follow up with [name]., MD. Schedule an appointment as soon as possible for a visit in 1 week.   Specialty:  Internal Medicine   Contact information:   [address] [place] [address] [phone]       Follow up with Watson,BORIS, MD.   Specialty:  Internal Medicine   Why:  as scheduled   Contact information:   [place] [address] [phone]      Discharge Diagnoses:   Sepsis secondary to infectious colitis.   AcuteInfectious colitis  Acute kidney injury.  Diabetes mellitus type 2.  Anemia of chronic disease.  Chronic thrombocytopenia.  PMH lupus, lung cancer s/p radiation  Abdominal aortic atherosclerosis with heavy calcification.  Discharge Condition: Improved  Disposition: Discharge home   Diet recommendation: Heart healthy   Filed Weights   05/04/15 0458 05/05/15 0441 05/05/15 1211  Weight: 68.4 kg (150 lb 12.7 oz) 68.4 kg (150 lb 12.7 oz) 68.04 kg (150 lb)    History of present illness:  75 year old woman admitted 03/2015 for diarrhea, acute kidney injury, thought to be viral (C. difficile negative, GI pathogen panel negative at that time) with arrangements made for outpatient follow-up who presented to the emergency department 4/13 with recurrent bloody diarrhea; met sepsis criteria on admission, started on empiric antibiotics and admitted for acute colitis.  Hospital Course:  [name] was admitted and started abx For infectious colitis as suggested by leukocytosis, history and CT findings. She gradually improved  with resolution of diarrhea and leukocytosis. Remained afebrile. Seen by gastroenterology and underwent uneventful colonoscopy which did not suggest ongoing colitis. Recommendations were for high-fiber diet, outpatient follow-up, CT angiogram as an outpatient and discontinue antibiotics. She has received 5 days of antibiotics and is asymptomatic. She was noted to have thrombocytopenia which is long-standing and on the recommendation of oncology she did receive pheresis platelets. Acute kidney injury resolved with fluids. Individual issues as below.  1. Sepsis secondary to infectious colitis. Resolved. C difficile and GI pathogen panel negative. CT abdomen and pelvis on admission suggested infectious etiology. 2. Acute infectious colitis. Resolving. Blood cultures no growth to date. Continue abx. Colonoscopy was unrevealing in regard to bloody diarrhea. 3. Acute kidney injury, resolved. 4. Diabetes mellitus type 2, remains stable. 5. Anemia of chronic disease likely at baseline. Was hemoconcentrated on admission.  6. Chronic thrombocytopenia, etiology unclear hematology will follow-up as an outpatient. 7. PMH lupus, lung cancer s/p radiation 8. Abdominal aortic atherosclerosis with heavy calcification. Incidental finding on abdominal pelvic CT. Suspected significant stenoses common iliac arteries bilaterally. Gastroenterology plans outpatient CT angiogram of the abdomen and pelvis. Consider outpatient vascular referral after these results available.  Consultants:  GI  Procedures:  Colonoscopy 4/18 Impression: - Non-thrombosed external hemorrhoids found on   digital rectal exam.  - The examined portion of the ileum was normal.  - Diverticulosis in the sigmoid colon.  - Non-bleeding internal hemorrhoids.  NO OBVIOUS SOURCE FOR BLOODY DIARRHEA  IDENTIFIED.  Transfusion platelets 4/17  Discharge Instructions  Discharge Instructions    Diet - low sodium heart healthy    Complete by:  As directed  Discharge instructions    Complete by:  As directed   Call your physician or seek immediate medical attention for diarrhea, pain, fever, poor appetite or worsening of condition.     Increase activity slowly    Complete by:  As directed           Current Discharge Medication List    START taking these medications   Details  polycarbophil (FIBERCON) 625 MG tablet Take 2 tablets (1,250 mg total) by mouth daily with supper.      CONTINUE these medications which have CHANGED   Details  aspirin EC 81 MG tablet Take 1 tablet (81 mg total) by mouth daily. Restart 4/21      CONTINUE these medications which have NOT CHANGED   Details  albuterol (PROVENTIL HFA;VENTOLIN HFA) 108 (90 BASE) MCG/ACT inhaler Inhale 2 puffs into the lungs every 6 (six) hours as needed for wheezing or shortness of breath. Please instruct in usage. Qty: 1 Inhaler, Refills: 0    alendronate (FOSAMAX) 70 MG tablet Take 1 tablet by mouth Once a week. On Sunday    cholecalciferol (VITAMIN D) 1000 UNITS tablet Take 1,000 Units by mouth daily.     folic acid (FOLVITE) 1 MG tablet Take 1 mg by mouth daily.    methotrexate 2.5 MG tablet Take 7.5 mg by mouth 2 (two) times a week.    metoprolol tartrate (LOPRESSOR) 25 MG tablet Take 25 mg by mouth daily.     Multiple Vitamins-Minerals (PRESERVISION AREDS 2) CAPS Take 2 capsules by mouth 2 (two) times daily.    oxyCODONE-acetaminophen (PERCOCET/ROXICET) 5-325 MG tablet Take 1 tablet by mouth every 6 (six) hours as needed for moderate pain. Qty: 30 tablet, Refills: 0    pantoprazole (PROTONIX) 40 MG tablet Take 1 tablet (40 mg total) by mouth daily before breakfast. Qty: 30 tablet, Refills: 0    tetrahydrozoline-zinc (VISINE-AC) 0.05-0.25 % ophthalmic solution Place 2 drops into both eyes daily as needed  (dry eyes).      STOP taking these medications     naproxen sodium (ANAPROX) 220 MG tablet      cefUROXime (CEFTIN) 500 MG tablet      polyethylene glycol-electrolytes (NULYTELY/GOLYTELY) 420 g solution        Allergies  Allergen Reactions  . Bupropion Nausea Only  . Lopid  [Gemfibrozil] Nausea Only    The results of significant diagnostics from this hospitalization (including imaging, microbiology, ancillary and laboratory) are listed below for reference.    Significant Diagnostic Studies: Ct Abdomen Pelvis W Contrast  04/30/2015  CLINICAL DATA:  Vomiting and pain since this morning.  Hematochezia. EXAM: CT ABDOMEN AND PELVIS WITH CONTRAST TECHNIQUE: Multidetector CT imaging of the abdomen and pelvis was performed using the standard protocol following bolus administration of intravenous contrast. CONTRAST:  177m ISOVUE-300 IOPAMIDOL (ISOVUE-300) INJECTION 61% COMPARISON:  03/30/2015 FINDINGS: Lower chest:  No significant abnormality Hepatobiliary: There are normal appearances of the liver, gallbladder and bile ducts. Pancreas: Normal Spleen: Normal Adrenals/Urinary Tract: Both adrenals are normal. There is a 2.7 cm cyst at the lateral aspect of the left kidney. Kidneys and ureters are otherwise unremarkable. Urinary bladder is nearly empty but grossly unremarkable. Stomach/Bowel: Moderate gastric distention. There is mild fluid distention of small bowel. There is moderate fluid distention of the colon, consistent with diarrhea. No evidence of a bowel obstruction. No focal inflammatory changes are evident. No significant mesenteric edema or fluid. No pneumatosis. Mild uncomplicated colonic diverticulosis. Vascular/Lymphatic: The abdominal aorta is heavily  calcified and a rather small vessel. There is fusiform dilatation in the infrarenal aorta, but the maximum AP diameter is only 2 cm. There is heavily calcified common iliac artery plaque bilaterally, probably with significant stenosis.  Celiac and SMA are patent but there is significant calcified plaque at their origins. Reproductive: Hysterectomy.  No adnexal abnormalities. Other: No ascites.  No extraluminal air. Musculoskeletal: No significant skeletal lesions. Moderately severe degenerative lumbar disc disease. IMPRESSION: 1. Mild to moderate fluid distention of small and large bowel without obstruction and without focal inflammatory change. This may represent an infectious enteropathy. No evidence of obstruction, ischemia, mass or focal bowel lesion. 2. Mild uncomplicated colonic diverticulosis. 3. The abdominal aorta and its major branches are heavily calcified and rather small in caliber. There probably are significant stenoses of the common iliac arteries bilaterally. The origins of the celiac and superior mesenteric arteries are heavily calcified. Electronically Signed   By: Andreas Newport M.D.   On: 04/30/2015 22:16    Microbiology: Recent Results (from the past 240 hour(s))  C difficile quick scan w PCR reflex     Status: None   Collection Time: 04/30/15 10:40 PM  Result Value Ref Range Status   C Diff antigen NEGATIVE NEGATIVE Final   C Diff toxin NEGATIVE NEGATIVE Final   C Diff interpretation Negative for toxigenic C. difficile  Final  Blood culture (routine x 2)     Status: None   Collection Time: 04/30/15 10:55 PM  Result Value Ref Range Status   Specimen Description BLOOD RIGHT HAND  Final   Special Requests BOTTLES DRAWN AEROBIC AND ANAEROBIC 6CC EACH  Final   Culture NO GROWTH 5 DAYS  Final   Report Status 05/05/2015 FINAL  Final  Blood culture (routine x 2)     Status: None (Preliminary result)   Collection Time: 04/30/15 10:55 PM  Result Value Ref Range Status   Specimen Description BLOOD LEFT ANTECUBITAL  Final   Special Requests BOTTLES DRAWN AEROBIC AND ANAEROBIC 12 CC EACH  Final   Culture PENDING  Incomplete   Report Status PENDING  Incomplete  Gastrointestinal Panel by PCR , Stool     Status:  None   Collection Time: 05/01/15  3:39 AM  Result Value Ref Range Status   Campylobacter species NOT DETECTED NOT DETECTED Final   Plesimonas shigelloides NOT DETECTED NOT DETECTED Final   Salmonella species NOT DETECTED NOT DETECTED Final   Yersinia enterocolitica NOT DETECTED NOT DETECTED Final   Vibrio species NOT DETECTED NOT DETECTED Final   Vibrio cholerae NOT DETECTED NOT DETECTED Final   Enteroaggregative E coli (EAEC) NOT DETECTED NOT DETECTED Final   Enteropathogenic E coli (EPEC) NOT DETECTED NOT DETECTED Final   Enterotoxigenic E coli (ETEC) NOT DETECTED NOT DETECTED Final   Shiga like toxin producing E coli (STEC) NOT DETECTED NOT DETECTED Final   E. coli O157 NOT DETECTED NOT DETECTED Final   Shigella/Enteroinvasive E coli (EIEC) NOT DETECTED NOT DETECTED Final   Cryptosporidium NOT DETECTED NOT DETECTED Final   Cyclospora cayetanensis NOT DETECTED NOT DETECTED Final   Entamoeba histolytica NOT DETECTED NOT DETECTED Final   Giardia lamblia NOT DETECTED NOT DETECTED Final   Adenovirus F40/41 NOT DETECTED NOT DETECTED Final   Astrovirus NOT DETECTED NOT DETECTED Final   Norovirus GI/GII NOT DETECTED NOT DETECTED Final   Rotavirus A NOT DETECTED NOT DETECTED Final   Sapovirus (I, II, IV, and V) NOT DETECTED NOT DETECTED Final     Labs: Basic  Metabolic Panel:  Recent Labs Lab 04/30/15 2030 04/30/15 2045 05/01/15 0331 05/02/15 0445  NA 136 141 139 139  K 3.9 3.9 3.9 3.9  CL 101 103 111 115*  CO2 22  --  20* 18*  GLUCOSE 271* 269* 186* 137*  BUN _0 CREATININE 1.41* 1.30* 1.18* 0.80  CALCIUM 8.7*  --  7.2* 7.5*   Liver Function Tests:  Recent Labs Lab 04/30/15 2030  AST 28  ALT 14  ALKPHOS 120  BILITOT 0.9  PROT 8.2*  ALBUMIN 3.8   CBC:  Recent Labs Lab 04/30/15 2030  05/02/15 0445 05/03/15 0627 05/04/15 0530 05/04/15 1721 05/05/15 0623  WBC 40.8*  < > 8.2 3.6* 4.1 4.4 3.8*  NEUTROABS 32.2*  --   --   --   --   --  1.9  HGB 14.0  <  > 9.3* 9.2* 9.6* 9.6* 9.6*  HCT 44.9  < > 30.6* 29.8* 30.5* 30.8* 30.7*  MCV 95.3  < > 97.5 95.8 93.8 94.5 94.5  PLT 101*  < > 73* 58* 64* 62* 88*  < > = values in this interval not displayed.  CBG:  Recent Labs Lab 05/05/15 0009 05/05/15 0440 05/05/15 0723 05/05/15 1118 05/05/15 1626  GLUCAP 80 108* 146* 163* 132*    Principal Problem:   Sepsis (HCC) Active Problems:   Diabetes mellitus, type 2 (HCC)   Essential hypertension   Thrombocytopenia, unspecified (HCC)   AKI (acute kidney injury) ([place])   Colitis, acute   Time coordinating discharge: 35 minutes  Signed:  Murray Hodgkins, MD [facility] 05/05/2015, 5:12 PM    By signing my name below, I, Rennis Harding attest that this documentation has been prepared under the direction and in the presence of Murray Hodgkins, MD Electronically signed: Rennis Harding  05/05/2015   I personally performed the services described in this documentation. All medical record entries made by the scribe were at my direction. I have reviewed the chart and agree that the record reflects my personal performance and is accurate and complete. Murray Hodgkins, MD

## 2015-05-06 LAB — SODIUM, STOOL: Sodium, Stl: 60 mmol/L

## 2015-05-06 LAB — IMMUNOFIXATION ELECTROPHORESIS
IGA: 313 mg/dL (ref 64–422)
IGG (IMMUNOGLOBIN G), SERUM: 1413 mg/dL (ref 700–1600)
IgM, Serum: 142 mg/dL (ref 26–217)
Total Protein ELP: 6.2 g/dL (ref 6.0–8.5)

## 2015-05-07 ENCOUNTER — Encounter (HOSPITAL_COMMUNITY): Payer: Self-pay | Admitting: Gastroenterology

## 2015-05-12 ENCOUNTER — Ambulatory Visit: Payer: Medicare Other | Admitting: Orthopedic Surgery

## 2015-05-12 LAB — CULTURE, BLOOD (ROUTINE X 2): CULTURE: NO GROWTH

## 2015-05-15 DIAGNOSIS — M0689 Other specified rheumatoid arthritis, multiple sites: Secondary | ICD-10-CM | POA: Diagnosis not present

## 2015-05-15 DIAGNOSIS — Z681 Body mass index (BMI) 19 or less, adult: Secondary | ICD-10-CM | POA: Diagnosis not present

## 2015-05-15 DIAGNOSIS — I10 Essential (primary) hypertension: Secondary | ICD-10-CM | POA: Diagnosis not present

## 2015-05-15 DIAGNOSIS — R6520 Severe sepsis without septic shock: Secondary | ICD-10-CM | POA: Diagnosis not present

## 2015-05-15 DIAGNOSIS — E782 Mixed hyperlipidemia: Secondary | ICD-10-CM | POA: Diagnosis not present

## 2015-05-15 DIAGNOSIS — A09 Infectious gastroenteritis and colitis, unspecified: Secondary | ICD-10-CM | POA: Diagnosis not present

## 2015-05-15 DIAGNOSIS — Z1389 Encounter for screening for other disorder: Secondary | ICD-10-CM | POA: Diagnosis not present

## 2015-05-15 DIAGNOSIS — E119 Type 2 diabetes mellitus without complications: Secondary | ICD-10-CM | POA: Diagnosis not present

## 2015-05-15 DIAGNOSIS — J449 Chronic obstructive pulmonary disease, unspecified: Secondary | ICD-10-CM | POA: Diagnosis not present

## 2015-05-15 DIAGNOSIS — E1140 Type 2 diabetes mellitus with diabetic neuropathy, unspecified: Secondary | ICD-10-CM | POA: Diagnosis not present

## 2015-05-15 DIAGNOSIS — D649 Anemia, unspecified: Secondary | ICD-10-CM | POA: Diagnosis not present

## 2015-05-19 ENCOUNTER — Ambulatory Visit (HOSPITAL_COMMUNITY)
Admission: RE | Admit: 2015-05-19 | Discharge: 2015-05-19 | Disposition: A | Discharge status: Home / Self Care | Payer: Medicare Other | Source: Ambulatory Visit | Attending: Gastroenterology | Admitting: Gastroenterology

## 2015-05-19 DIAGNOSIS — I771 Stricture of artery: Secondary | ICD-10-CM | POA: Insufficient documentation

## 2015-05-19 DIAGNOSIS — R1084 Generalized abdominal pain: Secondary | ICD-10-CM | POA: Diagnosis not present

## 2015-05-19 DIAGNOSIS — I708 Atherosclerosis of other arteries: Secondary | ICD-10-CM | POA: Diagnosis not present

## 2015-05-19 DIAGNOSIS — A09 Infectious gastroenteritis and colitis, unspecified: Secondary | ICD-10-CM | POA: Diagnosis not present

## 2015-05-19 DIAGNOSIS — Z1389 Encounter for screening for other disorder: Secondary | ICD-10-CM | POA: Diagnosis not present

## 2015-05-19 DIAGNOSIS — I701 Atherosclerosis of renal artery: Secondary | ICD-10-CM | POA: Insufficient documentation

## 2015-05-19 DIAGNOSIS — D649 Anemia, unspecified: Secondary | ICD-10-CM | POA: Diagnosis not present

## 2015-05-19 DIAGNOSIS — R197 Diarrhea, unspecified: Secondary | ICD-10-CM

## 2015-05-19 DIAGNOSIS — K5790 Diverticulosis of intestine, part unspecified, without perforation or abscess without bleeding: Secondary | ICD-10-CM | POA: Insufficient documentation

## 2015-05-19 DIAGNOSIS — I77811 Abdominal aortic ectasia: Secondary | ICD-10-CM | POA: Insufficient documentation

## 2015-05-19 DIAGNOSIS — Z8719 Personal history of other diseases of the digestive system: Secondary | ICD-10-CM | POA: Diagnosis not present

## 2015-05-19 DIAGNOSIS — I700 Atherosclerosis of aorta: Secondary | ICD-10-CM | POA: Diagnosis not present

## 2015-05-19 DIAGNOSIS — Z681 Body mass index (BMI) 19 or less, adult: Secondary | ICD-10-CM | POA: Diagnosis not present

## 2015-05-19 MED ORDER — IOPAMIDOL (ISOVUE-370) INJECTION 76%
100.0000 mL | Freq: Once | INTRAVENOUS | Status: AC | PRN
  Administered 2015-05-19: 100 mL via INTRAVENOUS

## 2015-05-20 ENCOUNTER — Telehealth: Payer: Self-pay | Admitting: Gastroenterology

## 2015-05-22 ENCOUNTER — Encounter (HOSPITAL_COMMUNITY): Admission: RE | Payer: Self-pay | Source: Ambulatory Visit

## 2015-05-22 ENCOUNTER — Ambulatory Visit (HOSPITAL_COMMUNITY): Admission: RE | Admit: 2015-05-22 | Payer: Medicare Other | Source: Ambulatory Visit | Admitting: Internal Medicine

## 2015-05-22 SURGERY — COLONOSCOPY
Anesthesia: Moderate Sedation

## 2015-05-29 ENCOUNTER — Ambulatory Visit: Payer: Medicare Other

## 2015-05-29 ENCOUNTER — Ambulatory Visit (HOSPITAL_COMMUNITY)
Admission: RE | Admit: 2015-05-29 | Discharge: 2015-05-29 | Disposition: A | Discharge status: Home / Self Care | Payer: Medicare Other | Source: Ambulatory Visit | Attending: Orthopedic Surgery | Admitting: Orthopedic Surgery

## 2015-05-29 ENCOUNTER — Ambulatory Visit (INDEPENDENT_AMBULATORY_CARE_PROVIDER_SITE_OTHER): Payer: Medicare Other | Admitting: Orthopedic Surgery

## 2015-05-29 ENCOUNTER — Encounter: Payer: Self-pay | Admitting: Orthopedic Surgery

## 2015-05-29 VITALS — BP 128/61 | HR 102 | Ht 63.5 in | Wt 145.0 lb

## 2015-05-29 DIAGNOSIS — S92321D Displaced fracture of second metatarsal bone, right foot, subsequent encounter for fracture with routine healing: Secondary | ICD-10-CM | POA: Diagnosis not present

## 2015-05-29 DIAGNOSIS — S92901D Unspecified fracture of right foot, subsequent encounter for fracture with routine healing: Secondary | ICD-10-CM | POA: Diagnosis not present

## 2015-05-29 DIAGNOSIS — S92351D Displaced fracture of fifth metatarsal bone, right foot, subsequent encounter for fracture with routine healing: Secondary | ICD-10-CM | POA: Diagnosis not present

## 2015-05-29 NOTE — Progress Notes (Signed)
Consult follow-up fracture multiple metatarsal fractures right foot patient complains of foot pain and that the Cam [place] Rile is bothering her she had previous casting and another injury and did not tolerate the casting well because of pain and pressure over the distal tibia  Chief Complaint  Patient presents with  . Follow-up    Right foot fracture     Approximate injury date was March 12   X-rays done at the hospital today x-rays in the cast. Fractures metatarsal #234 and 5 are nondisplaced and showing excellent callus formation and healing  Foot alignment is normal  Released after cast removal

## 2015-05-29 NOTE — Patient Instructions (Signed)
Walk in regular shoes

## 2015-05-31 NOTE — Telephone Encounter (Signed)
Called patient TO DISCUSS RESULTS. SWALLOWING IMPROVED. She had HYPERPLASTIC POLYPS removed.  Her colon Bx are normal. THE CTA SHOWS ASCVD BUT NO SIGNIFICANT STENOSIS. OPV IN JUN 2017 E30 ABDOMINAL PAIN/DYSPHAGIA/RECTAL BLEEDING.

## 2015-06-01 ENCOUNTER — Encounter: Payer: Self-pay | Admitting: Gastroenterology

## 2015-06-01 NOTE — Telephone Encounter (Signed)
APPT MADE AND LETTER SENT  °

## 2015-06-01 NOTE — Telephone Encounter (Signed)
Noted  

## 2015-06-22 DIAGNOSIS — R05 Cough: Secondary | ICD-10-CM | POA: Diagnosis not present

## 2015-06-22 DIAGNOSIS — Z6824 Body mass index (BMI) 24.0-24.9, adult: Secondary | ICD-10-CM | POA: Diagnosis not present

## 2015-06-22 DIAGNOSIS — B370 Candidal stomatitis: Secondary | ICD-10-CM | POA: Diagnosis not present

## 2015-06-22 DIAGNOSIS — Z1389 Encounter for screening for other disorder: Secondary | ICD-10-CM | POA: Diagnosis not present

## 2015-06-22 DIAGNOSIS — J069 Acute upper respiratory infection, unspecified: Secondary | ICD-10-CM | POA: Diagnosis not present

## 2015-06-22 DIAGNOSIS — J209 Acute bronchitis, unspecified: Secondary | ICD-10-CM | POA: Diagnosis not present

## 2015-07-01 ENCOUNTER — Encounter: Payer: Self-pay | Admitting: Gastroenterology

## 2015-07-01 ENCOUNTER — Ambulatory Visit (INDEPENDENT_AMBULATORY_CARE_PROVIDER_SITE_OTHER): Payer: Medicare Other | Admitting: Gastroenterology

## 2015-07-01 VITALS — BP 160/80 | HR 79 | Temp 98.2°F | Ht 63.0 in | Wt 138.4 lb

## 2015-07-01 DIAGNOSIS — K625 Hemorrhage of anus and rectum: Secondary | ICD-10-CM

## 2015-07-01 DIAGNOSIS — B3781 Candidal esophagitis: Secondary | ICD-10-CM | POA: Insufficient documentation

## 2015-07-01 DIAGNOSIS — K529 Noninfective gastroenteritis and colitis, unspecified: Secondary | ICD-10-CM | POA: Diagnosis not present

## 2015-07-01 NOTE — Progress Notes (Signed)
ON RECALL  °

## 2015-07-01 NOTE — Patient Instructions (Signed)
PLEASE CALL WITH QUESTIONS OR CONCERNS REGARDING ABMDOMINAL PAIN, CHANGE IN BOWEL HABITS, RECTAL BLEEDING, OR PROBLEMS SWALLOWING.  CONTINUE FIBERCON.  CONTINUE PROTONIX. TAKE 30 MINUTES PRIOR TO BREAKFAST TO PREVENT HEARTBURN AND ULCERS.  FOLLOW UP IN 6-12 MOS.

## 2015-07-01 NOTE — Assessment & Plan Note (Signed)
SYMPTOMS CONTROLLED/RESOLVED.  CONTINUE TO MONITOR SYMPTOMS. CALL WITH QUESTIONS OR CONCERNS.

## 2015-07-01 NOTE — Assessment & Plan Note (Signed)
MOST LIKELY CAUSE FOR DYSPHAGIA. SYMPTOMS CONTROLLED/RESOLVED WITH DIFLUCAN.  CONTINUE TO MONITOR SYMPTOMS. CALL WITH QUESTIONS OR CONCERNS. MAY NEED EGD/DIL IF SYMPTOMS OCCUR AND NO EVIDENCE FOR THRUSH.   GREATER THAN 50% WAS SPENT IN COUNSELING & COORDINATION OF CARE WITH THE PATIENT: DISCUSSED DIFFERENTIAL DIAGNOSIS, PROCEDURE, BENEFITS, RISKS, AND MANAGEMENT OF DYSPHAGIA, RECTAL BLEEDING, AND ABDOMINAL PAIN. TOTAL ENCOUNTER TIME: 15 MINS.

## 2015-07-01 NOTE — Progress Notes (Signed)
CC'ED TO PCP 

## 2015-07-01 NOTE — Progress Notes (Signed)
Subjective:    Patient ID: [name], female    DOB: 1940/01/28, 75 y.o.   MRN: [id number]  FUSCO,LAWRENCE J., MD  HPI HEARTBURN GOOD AS LONG AS SHE DOESN'T EAT AND GO TO BED. BOWELS IRREGULAR. MAY BE DAILY AND THEN NOT FOR 3-4 DAYS. NOT EATING A WHOLE LOT. CONCERNED ABOUT HER PLT GOING BACK UP. OCCASIONAL FEVER/CHILLS BTU THINKS IT'S DUE TO SORE THROAT. HAD THRUSH BAD AND JUST NOW GETTING NORMAL. THROAT FEELS DRY ON EITHER SIDE. RARE NAUSEA. PAIN IN LEFT CHEST-SIMILAR TO CHEST WALL INFECTION. HASN'T SEEN DR. Gerarda Fraction. STARTED 4-5 DAYS AGO. [place]. NURSE TALKED TO HER & IT GOT BETTER.  PT DENIES FEVER, CHILLS, HEMATOCHEZIA, vomiting, melena, diarrhea, SHORTNESS OF BREATH,  abdominal pain, problems swallowing, problems with sedation, OR heartburn or indigestion.  Past Medical History  Diagnosis Date  . Diabetes mellitus     x 20 yrs  . Lupus ([place])     sle  . Fibromyalgia   . Thrombocytopenia, unspecified ([place]) 09/04/2013  . Lung cancer ([place]) 01/28/2015    left upper lobe lung /right upper lobe lung  . Fibromyalgia   . RA (rheumatoid arthritis) ([place])     ra  . Tobacco use   . Peripheral neuropathy [facility])     Past Surgical History  Procedure Laterality Date  . Appendectomy    . Cesarean section  x3  . Oophorectomy    . Cataract extraction w/ intraocular lens implant  2010    right eye  . Abdominal hysterectomy    . Left arm surgery      multiple surgeries on  . Trigger finger release  yrs ago    right hand  . Knee arthroscopy  yrs ago    left knee  . Shoulder hemi-arthroplasty  01/27/2011    Procedure: SHOULDER HEMI-ARTHROPLASTY;  Surgeon: Nita Sells, MD;  Location: [facility];  Service: Orthopedics;  Laterality: Right;  interscaline right shoulder  . Colonoscopy N/A 05/05/2015    Procedure: COLONOSCOPY;  Surgeon: Danie Binder, MD;  Location: [facility];  Service: Endoscopy;  Laterality: N/A;   Allergies  Allergen  Reactions  . Bupropion Nausea Only  . Lopid  [Gemfibrozil] Nausea Only   Current Outpatient Prescriptions  Medication Sig Dispense Refill  . albuterol (PROVENTIL HFA;VENTOLIN HFA) 108 (90 BASE) MCG/ACT inhaler Inhale 2 puffs into the lungs every 6 (six) hours as needed for wheezing or shortness of breath. Please instruct in usage. 1 Inhaler 0  . alendronate (FOSAMAX) 70 MG tablet Take 1 tablet by mouth Once a week. On Sunday    . aspirin EC 81 MG tablet Take 1 tablet (81 mg total) by mouth daily. Restart 4/21    . cholecalciferol (VITAMIN D) 1000 UNITS tablet Take 1,000 Units by mouth daily.     . folic acid (FOLVITE) 1 MG tablet Take 1 mg by mouth daily.    . methotrexate 2.5 MG tablet Take 7.5 mg by mouth 2 (two) times a week.    . metoprolol tartrate (LOPRESSOR) 25 MG tablet Take 25 mg by mouth daily.     . Multiple Vitamins-Minerals (PRESERVISION AREDS 2) CAPS Take 2 capsules by mouth 2 (two) times daily.    . pantoprazole (PROTONIX) 40 MG tablet Take 1 tablet (40 mg total) by mouth daily before breakfast. 30 tablet 0  . polycarbophil (FIBERCON) 625 MG tablet Take 2 tablets (1,250 mg total) by mouth daily with supper.    Marland Kitchen  tetrahydrozoline-zinc (VISINE-AC) 0.05-0.25 % ophthalmic solution Place 2 drops into both eyes daily as needed (dry eyes).    Marland Kitchen azithromycin (ZITHROMAX) 250 MG tablet Reported on 07/01/2015    . fluconazole (DIFLUCAN) 150 MG tablet Reported on 07/01/2015    . oxyCODONE-acetaminophen (PERCOCET/ROXICET) 5-325 MG tablet Take 1 tablet by mouth every 6 (six) hours as needed for moderate pain. (Patient not taking: Reported on 07/01/2015) 30 tablet 0   Review of Systems PER HPI OTHERWISE ALL SYSTEMS ARE NEGATIVE.    Objective:   Physical Exam  Constitutional: She is oriented to person, place, and time. She appears well-developed and well-nourished. No distress.  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  Mouth/Throat: Oropharynx is clear and moist.  No oropharyngeal exudate.  Eyes: Pupils are equal, round, and reactive to light. No scleral icterus.  Neck: Normal range of motion. Neck supple.  Cardiovascular: Normal rate, regular rhythm and normal heart sounds.   Pulmonary/Chest: Effort normal and breath sounds normal. No respiratory distress.  Abdominal: Soft. Bowel sounds are normal. She exhibits no distension. There is no tenderness.  Musculoskeletal: She exhibits no edema.  WIDE BASED GAIT  Lymphadenopathy:    She has no cervical adenopathy.  Neurological: She is alert and oriented to person, place, and time.  NO  NEW FOCAL DEFICITS, HARD OF HEARING  Psychiatric: She has a normal mood and affect.  Vitals reviewed.         Assessment & Plan:

## 2015-07-27 ENCOUNTER — Telehealth: Payer: Self-pay | Admitting: *Deleted

## 2015-07-27 NOTE — Telephone Encounter (Signed)
CALLED PATIENT TO OF FU TOMORROW (07/28/15) @ 10 AM, SPOKE WITH PATIENT AND SHE IS AWARE OF THIS APPT.

## 2015-07-27 NOTE — Telephone Encounter (Signed)
Called the patient per in basket note, informed patient we will have Romie Jumper call her today or in am to make a follow up appt with Shona Simpson, PA,   As soon as possible,  She requested to make the appt time either 100am or 1100am  Her son has to drive her and needs to go to work by 2pm,  Patient said thank you and waiting call back 4:15 PM

## 2015-07-27 NOTE — Telephone Encounter (Signed)
FYI "I had a laser treatment by Dr. Lisbeth Renshaw and need an appointment with him to see if it worked."  Call transferred to RT ext [phone].

## 2015-07-28 ENCOUNTER — Ambulatory Visit (HOSPITAL_COMMUNITY): Payer: Medicare Other

## 2015-07-28 ENCOUNTER — Ambulatory Visit
Admission: RE | Admit: 2015-07-28 | Discharge: 2015-07-28 | Disposition: A | Discharge status: Home / Self Care | Payer: Medicare Other | Source: Ambulatory Visit | Attending: Radiation Oncology | Admitting: Radiation Oncology

## 2015-07-28 ENCOUNTER — Encounter: Payer: Self-pay | Admitting: Radiation Oncology

## 2015-07-28 ENCOUNTER — Other Ambulatory Visit: Payer: Self-pay | Admitting: Radiation Oncology

## 2015-07-28 ENCOUNTER — Telehealth: Payer: Self-pay | Admitting: *Deleted

## 2015-07-28 VITALS — BP 136/40 | HR 98 | Temp 97.8°F | Ht 63.0 in | Wt 136.9 lb

## 2015-07-28 DIAGNOSIS — Z1239 Encounter for other screening for malignant neoplasm of breast: Secondary | ICD-10-CM

## 2015-07-28 DIAGNOSIS — C3410 Malignant neoplasm of upper lobe, unspecified bronchus or lung: Secondary | ICD-10-CM

## 2015-07-28 DIAGNOSIS — R079 Chest pain, unspecified: Secondary | ICD-10-CM | POA: Diagnosis not present

## 2015-07-28 DIAGNOSIS — G629 Polyneuropathy, unspecified: Secondary | ICD-10-CM | POA: Diagnosis not present

## 2015-07-28 DIAGNOSIS — N644 Mastodynia: Secondary | ICD-10-CM | POA: Diagnosis not present

## 2015-07-28 DIAGNOSIS — M329 Systemic lupus erythematosus, unspecified: Secondary | ICD-10-CM | POA: Diagnosis not present

## 2015-07-28 DIAGNOSIS — C3412 Malignant neoplasm of upper lobe, left bronchus or lung: Secondary | ICD-10-CM | POA: Insufficient documentation

## 2015-07-28 DIAGNOSIS — R0789 Other chest pain: Secondary | ICD-10-CM | POA: Insufficient documentation

## 2015-07-28 DIAGNOSIS — M069 Rheumatoid arthritis, unspecified: Secondary | ICD-10-CM | POA: Insufficient documentation

## 2015-07-28 DIAGNOSIS — D696 Thrombocytopenia, unspecified: Secondary | ICD-10-CM | POA: Insufficient documentation

## 2015-07-28 DIAGNOSIS — F17210 Nicotine dependence, cigarettes, uncomplicated: Secondary | ICD-10-CM | POA: Diagnosis not present

## 2015-07-28 DIAGNOSIS — C3411 Malignant neoplasm of upper lobe, right bronchus or lung: Secondary | ICD-10-CM | POA: Insufficient documentation

## 2015-07-28 DIAGNOSIS — Z79899 Other long term (current) drug therapy: Secondary | ICD-10-CM | POA: Diagnosis not present

## 2015-07-28 NOTE — Telephone Encounter (Signed)
CALLED PATIENT TO INFORM OF LAB AND CT, LVM FOR A RETURN CALL

## 2015-07-28 NOTE — Addendum Note (Signed)
Encounter addended by: Benn Moulder, RN on: 07/28/2015 11:54 AM<BR>     Documentation filed: Charges VN

## 2015-07-28 NOTE — Progress Notes (Addendum)
[name] here today at her request due to level 8 pain in her left chest wall.described as a dull ache, with episodes of shooting pain. Reports SOB with activity. Seen on 07/01/15 by Dr. Barney Drain for thrush and heartburn.  Main concern is lack of energy

## 2015-07-28 NOTE — Progress Notes (Signed)
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  Interval Since Last Radiation: 5 months  03/06/15-03/16/15: 54 Gy in 3 fractions to the right upper lobe lung nodule and 50 Gy in 5 fractions to the left upper lobe lung nodule.   Narrative:  The patient returns today for routine follow-up.  She has not been back since she completed radiotherapy and was not seen for her 1 month follow-up which she canceled. She comes today for follow-up evaluation with complaints of left sided chest pain that has been present for several  weeks and has been persistent. She describes recovering well from her fractured right foot, and her GI bleed that occurred in April. She will continue to be followed at [facility] for care regarding her thrombocytopenia as well.                        On review of systems, the patient states that she is doing okay, however she has been experiencing left-sided pain in her chest wall, she describes a lump along the chest itself. She states that it has been at least a year and a half since her last mammogram, but denies any difficulty with palpable masses in her breasts. She does describe a sensation of soreness centrally in the breasts bilaterally as though she were nursing again. She denies any shortness of breath at rest. She does experience some of this with exertion, and states that this is her baseline. She does not have a pulmonologist, or require oxygen therapy. She denies any new musculoskeletal aches or pains, changes in memory or cognition. She denies any abdominal pain, nausea or vomiting. Her bowels and bladder are working well. A complete review of systems is obtained and is otherwise  negative.  Past Medical History:  Past Medical History  Diagnosis Date  . Diabetes mellitus     x 20 yrs  . Lupus ([place])     sle  . Fibromyalgia   . Thrombocytopenia, unspecified ([place]) 09/04/2013  . Lung cancer ([place]) 01/28/2015    left upper lobe lung /right upper lobe lung  . Fibromyalgia   . RA (rheumatoid arthritis) ([place])     ra  . Tobacco use   . Peripheral neuropathy [facility])     Past Surgical History: Past Surgical History  Procedure Laterality Date  . Appendectomy    . Cesarean section  x3  . Oophorectomy    . Cataract extraction w/ intraocular lens implant  2010    right eye  . Abdominal hysterectomy    . Left arm surgery      multiple surgeries on  . Trigger finger release  yrs ago    right hand  . Knee arthroscopy  yrs ago    left knee  . Shoulder hemi-arthroplasty  01/27/2011    Procedure: SHOULDER HEMI-ARTHROPLASTY;  Surgeon: Nita Sells, MD;  Location: [facility] OR[facility];  Service: Orthopedics;  Laterality: Right;  interscaline right shoulder  . Colonoscopy N/A 05/05/2015    Procedure: COLONOSCOPY;  Surgeon: Danie Binder, MD;  Location[facility];  Service: Endoscopy;  Laterality: N/A;    Social History:  Social History   Social History  .  Marital Status: Widowed    Spouse Name: N/A  . Number of Children: N/A  . Years of Education: N/A   Occupational History  . Not on file.   Social History Main Topics  . Smoking status: Current Every Day Smoker -- 1.50 packs/day for 40 years    Types: Cigarettes  . Smokeless tobacco: Not on file     Comment: 2 packs a day x 30 yrs  . Alcohol Use: No  . Drug Use: No  . Sexual Activity: Not Currently   Other Topics Concern  . Not on file   Social History Narrative  The patient is widowed and resides [place].   Family History: Family History  Problem Relation Age of Onset  . Family history unknown: Yes    ALLERGIES:  is allergic to bupropion and lopid .  Meds: Current Outpatient  Prescriptions  Medication Sig Dispense Refill  . alendronate (FOSAMAX) 70 MG tablet Take 1 tablet by mouth Once a week. On Sunday    . aspirin EC 81 MG tablet Take 1 tablet (81 mg total) by mouth daily. Restart 4/21    . cholecalciferol (VITAMIN D) 1000 UNITS tablet Take 1,000 Units by mouth daily.     . folic acid (FOLVITE) 1 MG tablet Take 1 mg by mouth daily.    . methotrexate 2.5 MG tablet Take 7.5 mg by mouth 2 (two) times a week.    . Multiple Vitamins-Minerals (PRESERVISION AREDS 2) CAPS Take 2 capsules by mouth 2 (two) times daily.    . polycarbophil (FIBERCON) 625 MG tablet Take 2 tablets (1,250 mg total) by mouth daily with supper.    . tetrahydrozoline-zinc (VISINE-AC) 0.05-0.25 % ophthalmic solution Place 2 drops into both eyes daily as needed (dry eyes).    Marland Kitchen albuterol (PROVENTIL HFA;VENTOLIN HFA) 108 (90 BASE) MCG/ACT inhaler Inhale 2 puffs into the lungs every 6 (six) hours as needed for wheezing or shortness of breath. Please instruct in usage. (Patient not taking: Reported on 07/28/2015) 1 Inhaler 0  . metoprolol tartrate (LOPRESSOR) 25 MG tablet Take 25 mg by mouth daily. Reported on 07/28/2015     No current facility-administered medications for this encounter.   Facility-Administered Medications Ordered in Other Encounters  Medication Dose Route Frequency Provider Last Rate Last Dose  . 0.9 %  sodium chloride infusion   Intravenous Continuous Hurley Cisco, MD 20 mL/hr at 10/04/10 1339      Physical Findings:  height is '5\' 3"'$  (1.6 m) and weight is 136 lb 14.4 oz (62.097 kg). Her temperature is 97.8 F (36.6 C). Her blood pressure is 136/40 and her pulse is 98. Her oxygen saturation is 99%. .   In general this is a well appearing Caucasian female in no acute distress. She is alert and oriented x4 and appropriate throughout the examination. HEENT reveals that the patient is normocephalic, atraumatic. EOMs are intact. PERRLA. Skin is intact without any evidence of gross  lesions. Cardiovascular exam reveals a regular rate and rhythm, no clicks rubs or murmurs are auscultated. Chest is clear to auscultation bilaterally, with only rhonchi noted at the right lung field. Bilateral breast exam is performed, there are no palpable abnormalities in the breast tissue bilaterally. No evidence of asymmetry, nipple bleeding or discharge or skin change is present. Along the contour of her chest wall about 6 breath beneath the clavicle to the midline, there is elevation of the chest wall without palpable mass, ulceration, or specific focal finding. She identifies the  site is the originating location of her pain. Lymphatic assessment is performed and does not reveal any adenopathy in the cervical, supraclavicular, axillary, or inguinal chains. Abdomen has active bowel sounds in all quadrants and is intact. The abdomen is soft, non tender, non distended. Lower extremities are negative for pretibial pitting edema, deep calf tenderness, cyanosis or clubbing.   Lab Findings: Lab Results  Component Value Date   WBC 3.8* 05/05/2015   HGB 9.6* 05/05/2015   HCT 30.7* 05/05/2015   MCV 94.5 05/05/2015   PLT 88* 05/05/2015     Radiographic Findings: No results found.  Impression/Plan: 1. Synchronous stage I A, T1, N0, M0 of Bilateral upper lungs s/p SBRT. Patient did well and tolerating her radiotherapy, she has not been for repeat imaging to establish her baseline scan following treatment. We will proceed with a CT scan of the chest with contrast.We will follow these results by phone when available. Patient states agreement and understanding and is encouraged to call should any questions or concerns. 2. Left-sided chest wall pain that radiates into the right chest wall/breast. The patient has not had a mammogram in about a year and a half, we have discussed the rationale for moving forward with a diagnostic bilateral mammogram. We will follow-up with these results as well. Based on her  examination there is a slight contour abnormality of the left chest wall, although this may be her baseline, as I have not examined her previously, it is difficult to say whether or not this may have something to do with rib fracture from her previous SBRT treatment. We discussed that this was a possibility, and would formally evaluated on her CT of the chest. She does understand the importance of being seen in urgent setting of her symptoms became more acute in nature. She states agreement and understanding of this plan. 3. Tobacco cessation. The patient is interested in some tobacco cessation and states that she has been successful when she's been hospitalized. We discussed briefly the rationale of quitting and that this would improve her heart health and decrease the risks of MRI. She already has risks of this due to her high blood pressure and diabetes. She states understanding of this, and will consider cutting back 1 cigarette at a time. We will continue to follow this at subsequent visits. 4. Thrombocytopenia. The patient does not recall her workup with medical oncology for this in April, I will copy Kirby Crigler, [facility] on this note for follow up as well.  In a visit lasting 30 minutes, greater than 50% of that time was spent discussing the plan for her workup and coordination of care.    Carola Rhine, PAC

## 2015-08-05 ENCOUNTER — Other Ambulatory Visit (HOSPITAL_COMMUNITY)
Admission: RE | Admit: 2015-08-05 | Discharge: 2015-08-05 | Disposition: A | Discharge status: Home / Self Care | Payer: Medicare Other | Source: Ambulatory Visit | Attending: Radiation Oncology | Admitting: Radiation Oncology

## 2015-08-05 ENCOUNTER — Ambulatory Visit (HOSPITAL_COMMUNITY)
Admission: RE | Admit: 2015-08-05 | Discharge: 2015-08-05 | Disposition: A | Discharge status: Home / Self Care | Payer: Medicare Other | Source: Ambulatory Visit | Attending: Radiation Oncology | Admitting: Radiation Oncology

## 2015-08-05 DIAGNOSIS — I700 Atherosclerosis of aorta: Secondary | ICD-10-CM | POA: Insufficient documentation

## 2015-08-05 DIAGNOSIS — R918 Other nonspecific abnormal finding of lung field: Secondary | ICD-10-CM | POA: Insufficient documentation

## 2015-08-05 DIAGNOSIS — C3411 Malignant neoplasm of upper lobe, right bronchus or lung: Secondary | ICD-10-CM | POA: Diagnosis not present

## 2015-08-05 DIAGNOSIS — I2510 Atherosclerotic heart disease of native coronary artery without angina pectoris: Secondary | ICD-10-CM | POA: Insufficient documentation

## 2015-08-05 DIAGNOSIS — R0602 Shortness of breath: Secondary | ICD-10-CM | POA: Diagnosis not present

## 2015-08-05 DIAGNOSIS — C3410 Malignant neoplasm of upper lobe, unspecified bronchus or lung: Secondary | ICD-10-CM

## 2015-08-05 LAB — POCT I-STAT CREATININE: CREATININE: 0.8 mg/dL (ref 0.44–1.00)

## 2015-08-05 MED ORDER — IOPAMIDOL (ISOVUE-300) INJECTION 61%
75.0000 mL | Freq: Once | INTRAVENOUS | Status: AC | PRN
  Administered 2015-08-05: 75 mL via INTRAVENOUS

## 2015-08-06 DIAGNOSIS — Z6823 Body mass index (BMI) 23.0-23.9, adult: Secondary | ICD-10-CM | POA: Diagnosis not present

## 2015-08-06 DIAGNOSIS — Z1389 Encounter for screening for other disorder: Secondary | ICD-10-CM | POA: Diagnosis not present

## 2015-08-06 DIAGNOSIS — R079 Chest pain, unspecified: Secondary | ICD-10-CM | POA: Diagnosis not present

## 2015-08-06 DIAGNOSIS — N39498 Other specified urinary incontinence: Secondary | ICD-10-CM | POA: Diagnosis not present

## 2015-08-06 DIAGNOSIS — E119 Type 2 diabetes mellitus without complications: Secondary | ICD-10-CM | POA: Diagnosis not present

## 2015-08-06 DIAGNOSIS — M3210 Systemic lupus erythematosus, organ or system involvement unspecified: Secondary | ICD-10-CM | POA: Diagnosis not present

## 2015-08-11 ENCOUNTER — Telehealth: Payer: Self-pay | Admitting: Radiation Oncology

## 2015-08-11 DIAGNOSIS — C3401 Malignant neoplasm of right main bronchus: Secondary | ICD-10-CM

## 2015-08-11 DIAGNOSIS — C3412 Malignant neoplasm of upper lobe, left bronchus or lung: Secondary | ICD-10-CM | POA: Insufficient documentation

## 2015-08-11 NOTE — Telephone Encounter (Addendum)
The patient call me back a few minutes later and we discussed her findings and recommendations as below. Scheduling will be in touch with her to coordinate this.   I called the patient to review her CT of the chest and recommendations for repeat CT in 6 months time with follow up thereafter. She was unavailable so instead I left a message asking her to call me back.

## 2015-11-23 ENCOUNTER — Encounter: Payer: Self-pay | Admitting: Gastroenterology

## 2015-12-07 DIAGNOSIS — M0689 Other specified rheumatoid arthritis, multiple sites: Secondary | ICD-10-CM | POA: Diagnosis not present

## 2015-12-07 DIAGNOSIS — G894 Chronic pain syndrome: Secondary | ICD-10-CM | POA: Diagnosis not present

## 2015-12-07 DIAGNOSIS — Z6823 Body mass index (BMI) 23.0-23.9, adult: Secondary | ICD-10-CM | POA: Diagnosis not present

## 2015-12-07 DIAGNOSIS — I471 Supraventricular tachycardia: Secondary | ICD-10-CM | POA: Diagnosis not present

## 2015-12-07 DIAGNOSIS — Z23 Encounter for immunization: Secondary | ICD-10-CM | POA: Diagnosis not present

## 2015-12-07 DIAGNOSIS — E119 Type 2 diabetes mellitus without complications: Secondary | ICD-10-CM | POA: Diagnosis not present

## 2015-12-07 DIAGNOSIS — Z1389 Encounter for screening for other disorder: Secondary | ICD-10-CM | POA: Diagnosis not present

## 2015-12-07 DIAGNOSIS — M1991 Primary osteoarthritis, unspecified site: Secondary | ICD-10-CM | POA: Diagnosis not present

## 2016-01-19 ENCOUNTER — Telehealth: Payer: Self-pay | Admitting: *Deleted

## 2016-01-19 ENCOUNTER — Encounter: Payer: Self-pay | Admitting: Gastroenterology

## 2016-01-19 NOTE — Telephone Encounter (Signed)
CALLED PATIENT'S SON- STEVE BROWN TO INFORM OF STAT LABS AND CT ON 02-08-16 AND HER FU APPT. WITH ALISON PERKINS ON 02-09-16, LVM FOR A RETURN CALL

## 2016-01-28 ENCOUNTER — Telehealth: Payer: Self-pay | Admitting: *Deleted

## 2016-01-28 NOTE — Telephone Encounter (Signed)
CALLED PATIENT TO INFORM THAT CT HAS BEEN MOVED TO [facility] ON 02-08-16 @ 3 PM, LVM FOR A RETURN CALL

## 2016-02-08 ENCOUNTER — Ambulatory Visit (HOSPITAL_COMMUNITY): Payer: Medicare Other

## 2016-02-08 ENCOUNTER — Ambulatory Visit (HOSPITAL_COMMUNITY)
Admission: RE | Admit: 2016-02-08 | Discharge: 2016-02-08 | Disposition: A | Discharge status: Home / Self Care | Payer: Medicare Other | Source: Ambulatory Visit | Attending: Radiation Oncology | Admitting: Radiation Oncology

## 2016-02-08 ENCOUNTER — Ambulatory Visit: Payer: Medicare Other

## 2016-02-08 DIAGNOSIS — C3401 Malignant neoplasm of right main bronchus: Secondary | ICD-10-CM | POA: Diagnosis not present

## 2016-02-08 DIAGNOSIS — C3492 Malignant neoplasm of unspecified part of left bronchus or lung: Secondary | ICD-10-CM | POA: Diagnosis not present

## 2016-02-08 DIAGNOSIS — C3491 Malignant neoplasm of unspecified part of right bronchus or lung: Secondary | ICD-10-CM | POA: Diagnosis not present

## 2016-02-08 LAB — POCT I-STAT CREATININE: CREATININE: 0.8 mg/dL (ref 0.44–1.00)

## 2016-02-08 MED ORDER — IOPAMIDOL (ISOVUE-300) INJECTION 61%
75.0000 mL | Freq: Once | INTRAVENOUS | Status: AC | PRN
  Administered 2016-02-08: 75 mL via INTRAVENOUS

## 2016-02-08 NOTE — Progress Notes (Signed)
[name] 76 y.o. woman with  completed radiation SBRT 2-17-17Synchronous probable Stage IA NSCLC of the right upper lobe and left upper lobe without tissue confirmation.  Radiation completed SBRT 03-06-15 left lung completed  and review 02-08-16 CT chest w contrast.   Weight changes, if any: Wt Readings from Last 3 Encounters:  02/17/16 134 lb 9.6 oz (61.1 kg)  07/28/15 136 lb 14.4 oz (62.1 kg)  07/01/15 138 lb 6.4 oz (62.8 kg)   Respiratory complaints, if any: Coughing at night grayish to clear,denies SOB and wheezing Hemoptysis, if any:  No Swallowing Problems/Pain/Difficulty swallowing:Liquids feel like they go down the wrong way, no problems when she eats. Appetite :Good eating two meals per day. Pain: 5/10 hurts all over has   arthritis Fatigue:Having fatigue in the afternoon takes a nap. When is next chemo scheduled?: No Lab work from of chart: None Imaging: 02-08-16 Ct chest w contrast BP (!) 141/57   Pulse 90   Temp 97.9 F (36.6 C) (Oral)   Resp 18   Ht '5\' 3"'$  (1.6 m)   Wt 134 lb 9.6 oz (61.1 kg)   SpO2 100%   BMI 23.84 kg/m

## 2016-02-09 ENCOUNTER — Ambulatory Visit
Admission: RE | Admit: 2016-02-09 | Discharge: 2016-02-09 | Disposition: A | Discharge status: Home / Self Care | Payer: Medicare Other | Source: Ambulatory Visit | Attending: Radiation Oncology | Admitting: Radiation Oncology

## 2016-02-17 ENCOUNTER — Ambulatory Visit
Admission: RE | Admit: 2016-02-17 | Discharge: 2016-02-17 | Disposition: A | Discharge status: Home / Self Care | Payer: Medicare Other | Source: Ambulatory Visit | Attending: Radiation Oncology | Admitting: Radiation Oncology

## 2016-02-17 ENCOUNTER — Telehealth: Payer: Self-pay | Admitting: *Deleted

## 2016-02-17 ENCOUNTER — Encounter: Payer: Self-pay | Admitting: Radiation Oncology

## 2016-02-17 VITALS — BP 141/57 | HR 90 | Temp 97.9°F | Resp 18 | Ht 63.0 in | Wt 134.6 lb

## 2016-02-17 DIAGNOSIS — D696 Thrombocytopenia, unspecified: Secondary | ICD-10-CM | POA: Insufficient documentation

## 2016-02-17 DIAGNOSIS — C3411 Malignant neoplasm of upper lobe, right bronchus or lung: Secondary | ICD-10-CM | POA: Insufficient documentation

## 2016-02-17 DIAGNOSIS — E1140 Type 2 diabetes mellitus with diabetic neuropathy, unspecified: Secondary | ICD-10-CM | POA: Insufficient documentation

## 2016-02-17 DIAGNOSIS — I700 Atherosclerosis of aorta: Secondary | ICD-10-CM | POA: Insufficient documentation

## 2016-02-17 DIAGNOSIS — Z96611 Presence of right artificial shoulder joint: Secondary | ICD-10-CM | POA: Insufficient documentation

## 2016-02-17 DIAGNOSIS — R590 Localized enlarged lymph nodes: Secondary | ICD-10-CM | POA: Insufficient documentation

## 2016-02-17 DIAGNOSIS — F17210 Nicotine dependence, cigarettes, uncomplicated: Secondary | ICD-10-CM | POA: Insufficient documentation

## 2016-02-17 DIAGNOSIS — Z7982 Long term (current) use of aspirin: Secondary | ICD-10-CM | POA: Insufficient documentation

## 2016-02-17 DIAGNOSIS — J439 Emphysema, unspecified: Secondary | ICD-10-CM | POA: Diagnosis not present

## 2016-02-17 DIAGNOSIS — M069 Rheumatoid arthritis, unspecified: Secondary | ICD-10-CM | POA: Diagnosis not present

## 2016-02-17 DIAGNOSIS — Z08 Encounter for follow-up examination after completed treatment for malignant neoplasm: Secondary | ICD-10-CM | POA: Diagnosis not present

## 2016-02-17 DIAGNOSIS — C3412 Malignant neoplasm of upper lobe, left bronchus or lung: Secondary | ICD-10-CM | POA: Diagnosis not present

## 2016-02-17 DIAGNOSIS — M329 Systemic lupus erythematosus, unspecified: Secondary | ICD-10-CM | POA: Diagnosis not present

## 2016-02-17 DIAGNOSIS — M797 Fibromyalgia: Secondary | ICD-10-CM | POA: Insufficient documentation

## 2016-02-17 DIAGNOSIS — C3401 Malignant neoplasm of right main bronchus: Secondary | ICD-10-CM

## 2016-02-17 NOTE — Progress Notes (Signed)
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  Interval Since Last Radiation: 11 months  03/06/15-03/16/15: 54 Gy in 3 fractions to the right upper lobe lung nodule and 50 Gy in 5 fractions to the left upper lobe lung nodule.   Narrative:   She comes today for follow-up evaluation. In brief this is a pleasant 76 y.o. patient with a history of synchronous putative Stage IA, NSCLC, of the right upper, and left upper lobe of the lungs. She was not a candidate for biopsy/surgery, and went on to receive definitive radiotherapy after weighing the risks and benefits. She was last seen in July 2017 at which time her CT was stable with complaints, but she had complaints of left sided chest pain, and had recently recovered from a GI bleed that occurred in April. She is followed at [facility] for care regarding her thrombocytopenia as well. She was counseled to have a diagnostic mammogram bilaterally in July 2017, but never went for this. She had a CT Chest on  02/08/16 which revealed concerns for prominent bilateral axillary adenopathy. Her treated pulmonary lesions are stable, and right upper lobe there is some groundglass opacity that this was felt to be possibly treatment related. She comes to review these results as well.                     On review of systems, the patient reports that she is doing well overall. She reports no left sided chest pain today, but intermittently continues to have this which starts above her left shoulder, and radiates down into her left inframammary fold. 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 She denies any  shortness of breath, fevers, chills, night sweats, unintended weight changes. She has occasional cough, and clear to gray sputum production. She denies any bowel or bladder disturbances, and denies abdominal pain, nausea or vomiting. She denies any new musculoskeletal or joint aches or pains, new skin lesions or concerns. A complete review of systems is obtained and is otherwise negative.   Past Medical History:  Past Medical History:  Diagnosis Date  . Diabetes mellitus    x 20 yrs  . Fibromyalgia   . Fibromyalgia   . Lung cancer ([place]) 01/28/2015   left upper lobe lung /right upper lobe lung  . Lupus    sle  . Peripheral neuropathy ([place])   . RA (rheumatoid arthritis) ([place])    ra  . Thrombocytopenia, unspecified 09/04/2013  . Tobacco use     Past Surgical History: Past Surgical History:  Procedure Laterality Date  . ABDOMINAL HYSTERECTOMY    . APPENDECTOMY    . CATARACT EXTRACTION W/ INTRAOCULAR LENS IMPLANT  2010   right eye  . CESAREAN SECTION  x3  . COLONOSCOPY N/A 05/05/2015   Procedure: COLONOSCOPY;  Surgeon: Danie Binder, MD;  Location[facility];  Service: Endoscopy;  Laterality: N/A;  . KNEE ARTHROSCOPY  yrs ago   left knee  . left arm surgery     multiple surgeries on  . OOPHORECTOMY    . SHOULDER HEMI-ARTHROPLASTY  01/27/2011   Procedure:  SHOULDER HEMI-ARTHROPLASTY;  Surgeon: Nita Sells, MD;  Location: [facility];  Service: Orthopedics;  Laterality: Right;  interscaline right shoulder  . TRIGGER FINGER RELEASE  yrs ago   right hand    Social History:  Social History   Social History  . Marital status: Widowed    Spouse name: N/A  . Number of children: N/A  . Years of education: N/A   Occupational History  . Not on file.   Social History Main Topics  . Smoking status: Current Every Day Smoker    Packs/day: 1.50    Years: 40.00    Types: Cigarettes  . Smokeless tobacco: Current User     Comment: 2 packs a day x 30 yrs  . Alcohol use No    . Drug use: No  . Sexual activity: Not Currently   Other Topics Concern  . Not on file   Social History Narrative  . No narrative on file  The patient is widowed and resides in  [place], [place].   Family History: Family History  Problem Relation Age of Onset  . Family history unknown: Yes    ALLERGIES:  is allergic to bupropion and lopid  [gemfibrozil].  Meds: Current Outpatient Prescriptions  Medication Sig Dispense Refill  . aspirin EC 81 MG tablet Take 1 tablet (81 mg total) by mouth daily. Restart 4/21    . Multiple Vitamins-Minerals (PRESERVISION AREDS 2) CAPS Take 2 capsules by mouth 2 (two) times daily.    . polycarbophil (FIBERCON) 625 MG tablet Take 2 tablets (1,250 mg total) by mouth daily with supper.    . tetrahydrozoline-zinc (VISINE-AC) 0.05-0.25 % ophthalmic solution Place 2 drops into both eyes daily as needed (dry eyes).    Marland Kitchen albuterol (PROVENTIL HFA;VENTOLIN HFA) 108 (90 BASE) MCG/ACT inhaler Inhale 2 puffs into the lungs every 6 (six) hours as needed for wheezing or shortness of breath. Please instruct in usage. (Patient not taking: Reported on 07/28/2015) 1 Inhaler 0  . alendronate (FOSAMAX) 70 MG tablet Take 1 tablet by mouth Once a week. On Sunday    . cholecalciferol (VITAMIN D) 1000 UNITS tablet Take 1,000 Units by mouth daily.     . folic acid (FOLVITE) 1 MG tablet Take 1 mg by mouth daily.    . methotrexate 2.5 MG tablet Take 7.5 mg by mouth 2 (two) times a week.    . metoprolol tartrate (LOPRESSOR) 25 MG tablet Take 25 mg by mouth daily. Reported on 07/28/2015     No current facility-administered medications for this encounter.    Facility-Administered Medications Ordered in Other Encounters  Medication Dose Route Frequency Provider Last Rate Last Dose  . 0.9 %  sodium chloride infusion   Intravenous Continuous Hurley Cisco, MD 20 mL/hr at 10/04/10 1339      Physical Findings:  height is '5\' 3"'$  (1.6 m) and weight is 134 lb 9.6 oz (61.1  kg). Her oral temperature is 97.9 F (36.6 C). Her blood pressure is 141/57 (abnormal) and her pulse is 90. Her respiration is 18 and oxygen saturation is 100%. .   In general this is a well appearing Caucasian female in no acute distress. She is alert and oriented x4 and appropriate throughout the examination. HEENT reveals that the patient is normocephalic, atraumatic. EOMs are intact. PERRLA. Skin is intact without any evidence of gross lesions. Cardiovascular exam reveals a regular rate and rhythm, no clicks rubs or murmurs are auscultated. Chest is clear to auscultation bilaterally, with only rhonchi  noted at the right lung apex..  Lymphatic assessment is performed and does not reveal any adenopathy in the cervical, supraclavicular,  or inguinal chains. She has some shotty adenopathy in the right axilla, without palpable adenopathy in the left. Abdomen has active bowel sounds in all quadrants and is intact. The abdomen is soft, non tender, non distended. Lower extremities are negative for pretibial pitting edema, deep calf tenderness, cyanosis or clubbing.   Lab Findings: Lab Results  Component Value Date   WBC 3.8 (L) 05/05/2015   HGB 9.6 (L) 05/05/2015   HCT 30.7 (L) 05/05/2015   MCV 94.5 05/05/2015   PLT 88 (L) 05/05/2015     Radiographic Findings: Ct Chest W Contrast  Result Date: 02/08/2016 CLINICAL DATA:  Bilateral lung cancer status post SBRT. No current complaints. History of diabetes, rheumatoid arthritis and systemic lupus erythematosus. EXAM: CT CHEST WITH CONTRAST TECHNIQUE: Multidetector CT imaging of the chest was performed during intravenous contrast administration. CONTRAST:  64m ISOVUE-300 IOPAMIDOL (ISOVUE-300) INJECTION 61% COMPARISON:  Chest CT 08/05/2015 and 01/28/2015. FINDINGS: Cardiovascular: Diffuse atherosclerosis of the aorta, great vessels and coronary arteries. No acute vascular findings are seen. The heart size is normal. There is no pericardial effusion.  Mediastinum/Nodes: There are numerous prominent to mildly enlarged axillary lymph nodes bilaterally. These are chronic, although slightly larger than on the most recent study. The largest node measures 11 mm short axis on the right (image 48. Small mediastinal and hilar lymph nodes are stable. There is stable mild thyroid nodularity. Lungs/Pleura: There is no pleural effusion. Moderate diffuse emphysema and central airway thickening noted. There are new ground-glass opacities in the right upper lobe surrounding the small spiculated nodule, attributed to interval radiation therapy. The underlying nodule appears about the same, measuring 8 x 5 mm on image 47. The spiculated left apical nodule also appears unchanged, measuring 12 x 9 mm on image 20. No new or enlarging nodules are seen. Upper abdomen: The visualized upper abdomen appears stable. The spleen is prominent. Musculoskeletal/Chest wall: There is no chest wall mass or suspicious osseous finding. Chronic inferior endplate compression deformity at T11 is unchanged. Right shoulder arthroplasty noted. No obvious breast lesion. IMPRESSION: 1. Increased ground-glass opacity in the right upper lobe surrounding the spiculated nodule, attributed to interval radiation therapy. The underlying nodule is unchanged. 2. Stable left apical nodule. 3. Progressive enlargement of multiple axillary lymph nodes bilaterally. This would be an atypical manifestation of metastatic lung cancer and could be inflammatory or secondary to a lymphoproliferative process. Electronically Signed   By: WRichardean SaleM.D.   On: 02/08/2016 12:45    Impression/Plan: 1. Putative Synchronous Stage IA, T1, N0, M0 of Bilateral upper lungs s/p SBRT. The patient appears to be doing well in the treated sites of her previously noted disease. We will continue to follow her at 6 month intervals, but will base our plans for this once we've reviewed her Mammogram, to determine if there is a role for  PET. She states agreement and understanding of this.  2. Left-sided chest wall pain that radiates into the right chest wall/breast with axillary adenopathy bilaterally. Again, the patient has not had a mammogram in about two years. We have discussed the rationale for moving forward with a diagnostic bilateral mammogram. We will follow-up with these results as well. If she has normal breast imaging, we also discussed possible PET scan. She does understand the importance of being seen in urgent setting of her symptoms became more acute in nature. She  states agreement and understanding of this plan. 3. Tobacco cessation. Although the patient was open to cessation at her last visit, she reports she's not made any of the changes we last spoke of. I encouraged her to reconsider this, and we will follow up with her regarding this at her next visit. 4. Thrombocytopenia. The patient will follow up with her providers at [facility] as well for this.  In a visit lasting 30 minutes, greater than 50% of that time was spent discussing the plan for her workup and coordination of care.    Carola Rhine, PAC

## 2016-02-17 NOTE — Telephone Encounter (Signed)
Called patient to inform of mammogram on 03-01-16- arrival time - 1:10 pm for 1:20 pm  Appt. @ [facility], spoke with patient's son, Miguel Dibble and he is aware of this test

## 2016-02-29 ENCOUNTER — Other Ambulatory Visit: Payer: Self-pay | Admitting: Radiation Oncology

## 2016-02-29 DIAGNOSIS — N644 Mastodynia: Secondary | ICD-10-CM

## 2016-03-01 ENCOUNTER — Encounter: Payer: Self-pay | Admitting: Diagnostic Radiology

## 2016-03-01 ENCOUNTER — Ambulatory Visit (HOSPITAL_COMMUNITY)
Admission: RE | Admit: 2016-03-01 | Discharge: 2016-03-01 | Disposition: A | Discharge status: Home / Self Care | Payer: Medicare Other | Source: Ambulatory Visit | Attending: Radiation Oncology | Admitting: Radiation Oncology

## 2016-03-01 ENCOUNTER — Encounter (HOSPITAL_COMMUNITY): Payer: Medicare Other

## 2016-03-01 ENCOUNTER — Other Ambulatory Visit: Payer: Self-pay | Admitting: Radiation Oncology

## 2016-03-01 DIAGNOSIS — N644 Mastodynia: Secondary | ICD-10-CM | POA: Insufficient documentation

## 2016-03-01 DIAGNOSIS — R590 Localized enlarged lymph nodes: Secondary | ICD-10-CM | POA: Diagnosis not present

## 2016-03-01 DIAGNOSIS — N6489 Other specified disorders of breast: Secondary | ICD-10-CM | POA: Diagnosis not present

## 2016-03-01 DIAGNOSIS — R922 Inconclusive mammogram: Secondary | ICD-10-CM | POA: Diagnosis not present

## 2016-03-01 DIAGNOSIS — C3412 Malignant neoplasm of upper lobe, left bronchus or lung: Secondary | ICD-10-CM

## 2016-03-01 DIAGNOSIS — R591 Generalized enlarged lymph nodes: Secondary | ICD-10-CM

## 2016-03-01 DIAGNOSIS — R928 Other abnormal and inconclusive findings on diagnostic imaging of breast: Secondary | ICD-10-CM | POA: Diagnosis not present

## 2016-03-08 DIAGNOSIS — M25442 Effusion, left hand: Secondary | ICD-10-CM | POA: Diagnosis not present

## 2016-03-08 DIAGNOSIS — M0570 Rheumatoid arthritis with rheumatoid factor of unspecified site without organ or systems involvement: Secondary | ICD-10-CM | POA: Diagnosis not present

## 2016-03-08 DIAGNOSIS — M25441 Effusion, right hand: Secondary | ICD-10-CM | POA: Diagnosis not present

## 2016-03-08 DIAGNOSIS — M25461 Effusion, right knee: Secondary | ICD-10-CM | POA: Diagnosis not present

## 2016-03-08 DIAGNOSIS — Z79899 Other long term (current) drug therapy: Secondary | ICD-10-CM | POA: Diagnosis not present

## 2016-03-08 DIAGNOSIS — M25431 Effusion, right wrist: Secondary | ICD-10-CM | POA: Diagnosis not present

## 2016-03-08 DIAGNOSIS — M25432 Effusion, left wrist: Secondary | ICD-10-CM | POA: Diagnosis not present

## 2016-03-08 DIAGNOSIS — D696 Thrombocytopenia, unspecified: Secondary | ICD-10-CM | POA: Diagnosis not present

## 2016-03-09 ENCOUNTER — Other Ambulatory Visit: Payer: Self-pay | Admitting: Radiation Oncology

## 2016-03-09 DIAGNOSIS — R928 Other abnormal and inconclusive findings on diagnostic imaging of breast: Secondary | ICD-10-CM

## 2016-03-22 ENCOUNTER — Ambulatory Visit (HOSPITAL_COMMUNITY)
Admission: RE | Admit: 2016-03-22 | Discharge: 2016-03-22 | Disposition: A | Discharge status: Home / Self Care | Payer: Medicare Other | Source: Ambulatory Visit | Attending: Radiation Oncology | Admitting: Radiation Oncology

## 2016-03-22 ENCOUNTER — Other Ambulatory Visit: Payer: Self-pay | Admitting: Radiation Oncology

## 2016-03-22 ENCOUNTER — Other Ambulatory Visit (HOSPITAL_COMMUNITY): Payer: Self-pay | Admitting: Internal Medicine

## 2016-03-22 ENCOUNTER — Encounter (HOSPITAL_COMMUNITY): Payer: Self-pay

## 2016-03-22 DIAGNOSIS — R928 Other abnormal and inconclusive findings on diagnostic imaging of breast: Secondary | ICD-10-CM

## 2016-03-22 DIAGNOSIS — R590 Localized enlarged lymph nodes: Secondary | ICD-10-CM | POA: Diagnosis not present

## 2016-03-22 DIAGNOSIS — N6310 Unspecified lump in the right breast, unspecified quadrant: Secondary | ICD-10-CM

## 2016-03-22 MED ORDER — LIDOCAINE HCL (PF) 1 % IJ SOLN
INTRAMUSCULAR | Status: AC
  Filled 2016-03-22: qty 5

## 2016-03-22 MED ORDER — LIDOCAINE-EPINEPHRINE (PF) 1 %-1:200000 IJ SOLN
INTRAMUSCULAR | Status: AC
  Filled 2016-03-22: qty 30

## 2016-03-28 ENCOUNTER — Telehealth: Payer: Self-pay | Admitting: Radiation Oncology

## 2016-03-28 DIAGNOSIS — C3401 Malignant neoplasm of right main bronchus: Secondary | ICD-10-CM

## 2016-03-28 NOTE — Telephone Encounter (Signed)
I called and spoke with the patient's husband and she is aware of her biopsy results. We will coordinate next follow up and scans in 6 months.

## 2016-03-31 ENCOUNTER — Telehealth: Payer: Self-pay | Admitting: *Deleted

## 2016-03-31 NOTE — Telephone Encounter (Signed)
Called patient to inform of CT on 09-28-16 - arrival time - 10:45 am @ [facility], pt. to be NPO - 4 hrs. Prior to test and she has a fu visit with Shona Simpson on 09-29-16 @ 2:30 pm for results, spoke with patient and she is aware of these appts.

## 2016-04-20 DIAGNOSIS — M25441 Effusion, right hand: Secondary | ICD-10-CM | POA: Diagnosis not present

## 2016-04-20 DIAGNOSIS — M25442 Effusion, left hand: Secondary | ICD-10-CM | POA: Diagnosis not present

## 2016-04-20 DIAGNOSIS — M25561 Pain in right knee: Secondary | ICD-10-CM | POA: Diagnosis not present

## 2016-04-20 DIAGNOSIS — M0570 Rheumatoid arthritis with rheumatoid factor of unspecified site without organ or systems involvement: Secondary | ICD-10-CM | POA: Diagnosis not present

## 2016-04-20 DIAGNOSIS — Z79899 Other long term (current) drug therapy: Secondary | ICD-10-CM | POA: Diagnosis not present

## 2016-04-22 DIAGNOSIS — M0570 Rheumatoid arthritis with rheumatoid factor of unspecified site without organ or systems involvement: Secondary | ICD-10-CM | POA: Diagnosis not present

## 2016-04-22 DIAGNOSIS — Z79899 Other long term (current) drug therapy: Secondary | ICD-10-CM | POA: Diagnosis not present

## 2016-04-22 DIAGNOSIS — D696 Thrombocytopenia, unspecified: Secondary | ICD-10-CM | POA: Diagnosis not present

## 2016-05-03 ENCOUNTER — Telehealth: Payer: Self-pay | Admitting: Diagnostic Radiology

## 2016-05-12 DIAGNOSIS — Z1389 Encounter for screening for other disorder: Secondary | ICD-10-CM | POA: Diagnosis not present

## 2016-05-12 DIAGNOSIS — J449 Chronic obstructive pulmonary disease, unspecified: Secondary | ICD-10-CM | POA: Diagnosis not present

## 2016-05-12 DIAGNOSIS — I10 Essential (primary) hypertension: Secondary | ICD-10-CM | POA: Diagnosis not present

## 2016-05-12 DIAGNOSIS — Z6824 Body mass index (BMI) 24.0-24.9, adult: Secondary | ICD-10-CM | POA: Diagnosis not present

## 2016-05-12 DIAGNOSIS — M1991 Primary osteoarthritis, unspecified site: Secondary | ICD-10-CM | POA: Diagnosis not present

## 2016-05-12 DIAGNOSIS — E1140 Type 2 diabetes mellitus with diabetic neuropathy, unspecified: Secondary | ICD-10-CM | POA: Diagnosis not present

## 2016-05-12 DIAGNOSIS — C3410 Malignant neoplasm of upper lobe, unspecified bronchus or lung: Secondary | ICD-10-CM | POA: Diagnosis not present

## 2016-05-12 DIAGNOSIS — E782 Mixed hyperlipidemia: Secondary | ICD-10-CM | POA: Diagnosis not present

## 2016-05-18 NOTE — Progress Notes (Signed)
error 

## 2016-08-02 DIAGNOSIS — Z6825 Body mass index (BMI) 25.0-25.9, adult: Secondary | ICD-10-CM | POA: Diagnosis not present

## 2016-08-02 DIAGNOSIS — E782 Mixed hyperlipidemia: Secondary | ICD-10-CM | POA: Diagnosis not present

## 2016-08-02 DIAGNOSIS — Z1389 Encounter for screening for other disorder: Secondary | ICD-10-CM | POA: Diagnosis not present

## 2016-08-02 DIAGNOSIS — E119 Type 2 diabetes mellitus without complications: Secondary | ICD-10-CM | POA: Diagnosis not present

## 2016-08-02 DIAGNOSIS — M1991 Primary osteoarthritis, unspecified site: Secondary | ICD-10-CM | POA: Diagnosis not present

## 2016-08-02 DIAGNOSIS — M329 Systemic lupus erythematosus, unspecified: Secondary | ICD-10-CM | POA: Diagnosis not present

## 2016-08-02 DIAGNOSIS — D696 Thrombocytopenia, unspecified: Secondary | ICD-10-CM | POA: Diagnosis not present

## 2016-08-02 DIAGNOSIS — Z0000 Encounter for general adult medical examination without abnormal findings: Secondary | ICD-10-CM | POA: Diagnosis not present

## 2016-08-02 DIAGNOSIS — I10 Essential (primary) hypertension: Secondary | ICD-10-CM | POA: Diagnosis not present

## 2016-08-02 DIAGNOSIS — C3412 Malignant neoplasm of upper lobe, left bronchus or lung: Secondary | ICD-10-CM | POA: Diagnosis not present

## 2016-08-02 DIAGNOSIS — C3410 Malignant neoplasm of upper lobe, unspecified bronchus or lung: Secondary | ICD-10-CM | POA: Diagnosis not present

## 2016-08-02 DIAGNOSIS — E663 Overweight: Secondary | ICD-10-CM | POA: Diagnosis not present

## 2016-09-28 ENCOUNTER — Ambulatory Visit (HOSPITAL_COMMUNITY)
Admission: RE | Admit: 2016-09-28 | Discharge: 2016-09-28 | Disposition: A | Discharge status: Home / Self Care | Payer: Medicare Other | Source: Ambulatory Visit | Attending: Radiation Oncology | Admitting: Radiation Oncology

## 2016-09-28 ENCOUNTER — Ambulatory Visit: Payer: Medicare Other

## 2016-09-28 ENCOUNTER — Telehealth: Payer: Self-pay | Admitting: Radiation Oncology

## 2016-09-28 ENCOUNTER — Ambulatory Visit (HOSPITAL_COMMUNITY): Payer: Medicare Other

## 2016-09-28 DIAGNOSIS — C3401 Malignant neoplasm of right main bronchus: Secondary | ICD-10-CM | POA: Diagnosis not present

## 2016-09-28 DIAGNOSIS — I700 Atherosclerosis of aorta: Secondary | ICD-10-CM | POA: Diagnosis not present

## 2016-09-28 DIAGNOSIS — R590 Localized enlarged lymph nodes: Secondary | ICD-10-CM | POA: Insufficient documentation

## 2016-09-28 DIAGNOSIS — J439 Emphysema, unspecified: Secondary | ICD-10-CM | POA: Diagnosis not present

## 2016-09-28 DIAGNOSIS — I2510 Atherosclerotic heart disease of native coronary artery without angina pectoris: Secondary | ICD-10-CM | POA: Insufficient documentation

## 2016-09-28 DIAGNOSIS — R918 Other nonspecific abnormal finding of lung field: Secondary | ICD-10-CM | POA: Insufficient documentation

## 2016-09-28 DIAGNOSIS — C3412 Malignant neoplasm of upper lobe, left bronchus or lung: Secondary | ICD-10-CM

## 2016-09-28 DIAGNOSIS — C3411 Malignant neoplasm of upper lobe, right bronchus or lung: Secondary | ICD-10-CM | POA: Diagnosis not present

## 2016-09-28 LAB — POCT I-STAT CREATININE: Creatinine, Ser: 0.8 mg/dL (ref 0.44–1.00)

## 2016-09-28 MED ORDER — IOPAMIDOL (ISOVUE-300) INJECTION 61%
75.0000 mL | Freq: Once | INTRAVENOUS | Status: AC | PRN
Start: 2016-09-28 — End: 2016-09-28
  Administered 2016-09-28: 75 mL via INTRAVENOUS

## 2016-09-28 NOTE — Telephone Encounter (Signed)
I spoke with the patient by phone today about her CT results and she will return in 6 months for her next.

## 2016-09-29 ENCOUNTER — Inpatient Hospital Stay
Admission: RE | Admit: 2016-09-29 | Discharge: 2016-09-29 | Disposition: A | Discharge status: Home / Self Care | Payer: Self-pay | Source: Ambulatory Visit | Attending: Radiation Oncology | Admitting: Radiation Oncology

## 2016-11-07 DIAGNOSIS — Z6824 Body mass index (BMI) 24.0-24.9, adult: Secondary | ICD-10-CM | POA: Diagnosis not present

## 2016-11-07 DIAGNOSIS — L409 Psoriasis, unspecified: Secondary | ICD-10-CM | POA: Diagnosis not present

## 2016-11-07 DIAGNOSIS — Z1389 Encounter for screening for other disorder: Secondary | ICD-10-CM | POA: Diagnosis not present

## 2016-11-07 DIAGNOSIS — E1129 Type 2 diabetes mellitus with other diabetic kidney complication: Secondary | ICD-10-CM | POA: Diagnosis not present

## 2016-11-07 DIAGNOSIS — Z23 Encounter for immunization: Secondary | ICD-10-CM | POA: Diagnosis not present

## 2017-01-02 DIAGNOSIS — H6693 Otitis media, unspecified, bilateral: Secondary | ICD-10-CM | POA: Diagnosis not present

## 2017-01-02 DIAGNOSIS — J0100 Acute maxillary sinusitis, unspecified: Secondary | ICD-10-CM | POA: Diagnosis not present

## 2017-01-02 DIAGNOSIS — M069 Rheumatoid arthritis, unspecified: Secondary | ICD-10-CM | POA: Diagnosis not present

## 2017-01-02 DIAGNOSIS — E119 Type 2 diabetes mellitus without complications: Secondary | ICD-10-CM | POA: Diagnosis not present

## 2017-01-02 DIAGNOSIS — Z6824 Body mass index (BMI) 24.0-24.9, adult: Secondary | ICD-10-CM | POA: Diagnosis not present

## 2017-03-17 ENCOUNTER — Telehealth: Payer: Self-pay | Admitting: *Deleted

## 2017-03-17 NOTE — Telephone Encounter (Signed)
CALLED PATIENT TO INFORM OF CT FOR 03-27-17- ARRIVAL TIME - 9:15 AM FOR STAT LABS AND HER CT @ 9:30 AM , PT. TO BE NPO- 4 HRS. PRIOR TO TEST, TEST TO BE @ [place], SPOKE WITH PATIENT AND SHE IS AWARE OF THESE APPTS.

## 2017-03-27 ENCOUNTER — Ambulatory Visit (HOSPITAL_COMMUNITY)
Admission: RE | Admit: 2017-03-27 | Discharge: 2017-03-27 | Disposition: A | Discharge status: Home / Self Care | Payer: Medicare Other | Source: Ambulatory Visit | Attending: Radiation Oncology | Admitting: Radiation Oncology

## 2017-03-27 DIAGNOSIS — I700 Atherosclerosis of aorta: Secondary | ICD-10-CM | POA: Insufficient documentation

## 2017-03-27 DIAGNOSIS — C3490 Malignant neoplasm of unspecified part of unspecified bronchus or lung: Secondary | ICD-10-CM | POA: Diagnosis not present

## 2017-03-27 DIAGNOSIS — J439 Emphysema, unspecified: Secondary | ICD-10-CM | POA: Diagnosis not present

## 2017-03-27 DIAGNOSIS — C3401 Malignant neoplasm of right main bronchus: Secondary | ICD-10-CM | POA: Diagnosis not present

## 2017-03-27 DIAGNOSIS — C3412 Malignant neoplasm of upper lobe, left bronchus or lung: Secondary | ICD-10-CM

## 2017-03-27 LAB — POCT I-STAT CREATININE: CREATININE: 0.9 mg/dL (ref 0.44–1.00)

## 2017-03-27 MED ORDER — IOPAMIDOL (ISOVUE-300) INJECTION 61%
75.0000 mL | Freq: Once | INTRAVENOUS | Status: AC | PRN
  Administered 2017-03-27: 75 mL via INTRAVENOUS

## 2017-03-28 ENCOUNTER — Telehealth: Payer: Self-pay | Admitting: Radiation Oncology

## 2017-03-28 DIAGNOSIS — C3412 Malignant neoplasm of upper lobe, left bronchus or lung: Secondary | ICD-10-CM

## 2017-03-28 DIAGNOSIS — C3401 Malignant neoplasm of right main bronchus: Secondary | ICD-10-CM

## 2017-03-28 NOTE — Telephone Encounter (Signed)
I spoke with the patient today to discuss the findings from her CT scan, we discussed the rationale to repeat this in 6 months time according to NCCN guidelines.  She does state that she has had a little bit of an upper respiratory virus, but her CT does not show any evidence of pneumonia.  She was encouraged to proceed with evaluation by her primary provider if her symptoms do not improve in the next week.  She states agreement and understanding.  I will place orders for her next scan to be performed at [facility], and see her following that to review the results.

## 2017-04-20 ENCOUNTER — Ambulatory Visit (HOSPITAL_COMMUNITY)
Admission: RE | Admit: 2017-04-20 | Discharge: 2017-04-20 | Disposition: A | Discharge status: Home / Self Care | Payer: Medicare Other | Source: Ambulatory Visit | Attending: Physician Assistant | Admitting: Physician Assistant

## 2017-04-20 ENCOUNTER — Other Ambulatory Visit (HOSPITAL_COMMUNITY): Payer: Self-pay | Admitting: Physician Assistant

## 2017-04-20 DIAGNOSIS — H6120 Impacted cerumen, unspecified ear: Secondary | ICD-10-CM | POA: Diagnosis not present

## 2017-04-20 DIAGNOSIS — N644 Mastodynia: Secondary | ICD-10-CM | POA: Diagnosis not present

## 2017-04-20 DIAGNOSIS — R918 Other nonspecific abnormal finding of lung field: Secondary | ICD-10-CM | POA: Insufficient documentation

## 2017-04-20 DIAGNOSIS — Z6824 Body mass index (BMI) 24.0-24.9, adult: Secondary | ICD-10-CM | POA: Diagnosis not present

## 2017-04-20 DIAGNOSIS — R072 Precordial pain: Secondary | ICD-10-CM | POA: Diagnosis not present

## 2017-04-20 DIAGNOSIS — I998 Other disorder of circulatory system: Secondary | ICD-10-CM | POA: Insufficient documentation

## 2017-04-20 DIAGNOSIS — R079 Chest pain, unspecified: Secondary | ICD-10-CM | POA: Diagnosis not present

## 2017-05-24 DIAGNOSIS — J449 Chronic obstructive pulmonary disease, unspecified: Secondary | ICD-10-CM | POA: Diagnosis not present

## 2017-05-24 DIAGNOSIS — D649 Anemia, unspecified: Secondary | ICD-10-CM | POA: Diagnosis not present

## 2017-05-24 DIAGNOSIS — Z6825 Body mass index (BMI) 25.0-25.9, adult: Secondary | ICD-10-CM | POA: Diagnosis not present

## 2017-05-24 DIAGNOSIS — E663 Overweight: Secondary | ICD-10-CM | POA: Diagnosis not present

## 2017-05-24 DIAGNOSIS — J418 Mixed simple and mucopurulent chronic bronchitis: Secondary | ICD-10-CM | POA: Diagnosis not present

## 2017-05-24 DIAGNOSIS — I10 Essential (primary) hypertension: Secondary | ICD-10-CM | POA: Diagnosis not present

## 2017-05-24 DIAGNOSIS — E1142 Type 2 diabetes mellitus with diabetic polyneuropathy: Secondary | ICD-10-CM | POA: Diagnosis not present

## 2017-05-24 DIAGNOSIS — Z1389 Encounter for screening for other disorder: Secondary | ICD-10-CM | POA: Diagnosis not present

## 2017-05-30 DIAGNOSIS — D696 Thrombocytopenia, unspecified: Secondary | ICD-10-CM | POA: Diagnosis not present

## 2017-06-09 ENCOUNTER — Encounter (HOSPITAL_COMMUNITY): Payer: Self-pay | Admitting: Internal Medicine

## 2017-06-09 ENCOUNTER — Inpatient Hospital Stay (HOSPITAL_COMMUNITY): Payer: Medicare Other

## 2017-06-09 ENCOUNTER — Inpatient Hospital Stay (HOSPITAL_COMMUNITY): Payer: Medicare Other | Attending: Hematology | Admitting: Internal Medicine

## 2017-06-09 ENCOUNTER — Other Ambulatory Visit: Payer: Self-pay

## 2017-06-09 VITALS — BP 146/44 | HR 98 | Temp 97.9°F | Resp 16

## 2017-06-09 VITALS — BP 155/53 | HR 93 | Temp 98.4°F | Resp 18 | Wt 146.0 lb

## 2017-06-09 DIAGNOSIS — D696 Thrombocytopenia, unspecified: Secondary | ICD-10-CM

## 2017-06-09 DIAGNOSIS — Z7982 Long term (current) use of aspirin: Secondary | ICD-10-CM | POA: Diagnosis not present

## 2017-06-09 DIAGNOSIS — D649 Anemia, unspecified: Secondary | ICD-10-CM | POA: Diagnosis not present

## 2017-06-09 DIAGNOSIS — Z7984 Long term (current) use of oral hypoglycemic drugs: Secondary | ICD-10-CM | POA: Diagnosis not present

## 2017-06-09 DIAGNOSIS — F17210 Nicotine dependence, cigarettes, uncomplicated: Secondary | ICD-10-CM | POA: Diagnosis not present

## 2017-06-09 DIAGNOSIS — Z85118 Personal history of other malignant neoplasm of bronchus and lung: Secondary | ICD-10-CM | POA: Diagnosis not present

## 2017-06-09 DIAGNOSIS — Z79899 Other long term (current) drug therapy: Secondary | ICD-10-CM | POA: Diagnosis not present

## 2017-06-09 DIAGNOSIS — R911 Solitary pulmonary nodule: Secondary | ICD-10-CM | POA: Diagnosis not present

## 2017-06-09 DIAGNOSIS — E119 Type 2 diabetes mellitus without complications: Secondary | ICD-10-CM

## 2017-06-09 LAB — COMPREHENSIVE METABOLIC PANEL
ALBUMIN: 3.7 g/dL (ref 3.5–5.0)
ALT: 11 U/L — ABNORMAL LOW (ref 14–54)
ANION GAP: 8 (ref 5–15)
AST: 17 U/L (ref 15–41)
Alkaline Phosphatase: 73 U/L (ref 38–126)
BILIRUBIN TOTAL: 0.6 mg/dL (ref 0.3–1.2)
BUN: 28 mg/dL — AB (ref 6–20)
CHLORIDE: 101 mmol/L (ref 101–111)
CO2: 25 mmol/L (ref 22–32)
Calcium: 9.3 mg/dL (ref 8.9–10.3)
Creatinine, Ser: 0.86 mg/dL (ref 0.44–1.00)
GFR calc Af Amer: >60 mL/min (ref >60)
GFR calc non Af Amer: >60 mL/min (ref >60)
GLUCOSE: 131 mg/dL — AB (ref 65–99)
POTASSIUM: 4.4 mmol/L (ref 3.5–5.1)
SODIUM: 134 mmol/L — AB (ref 135–145)
TOTAL PROTEIN: 7.8 g/dL (ref 6.5–8.1)

## 2017-06-09 LAB — CBC WITH DIFFERENTIAL/PLATELET
BASOS ABS: 0 10*3/uL (ref 0.0–0.1)
Basophils Relative: 0 %
EOS PCT: 2 %
Eosinophils Absolute: 0.1 10*3/uL (ref 0.0–0.7)
HEMATOCRIT: 29 % — AB (ref 36.0–46.0)
Hemoglobin: 8.8 g/dL — ABNORMAL LOW (ref 12.0–15.0)
LYMPHS ABS: 0.9 10*3/uL (ref 0.7–4.0)
LYMPHS PCT: 23 %
MCH: 30.8 pg (ref 26.0–34.0)
MCHC: 30.3 g/dL (ref 30.0–36.0)
MCV: 101.4 fL — AB (ref 78.0–100.0)
MONO ABS: 0.7 10*3/uL (ref 0.1–1.0)
Monocytes Relative: 17 %
NEUTROS ABS: 2.4 10*3/uL (ref 1.7–7.7)
Neutrophils Relative %: 58 %
Platelets: 27 10*3/uL — CL (ref 150–400)
RBC: 2.86 MIL/uL — AB (ref 3.87–5.11)
RDW: 19.9 % — AB (ref 11.5–15.5)
WBC: 4.1 10*3/uL (ref 4.0–10.5)

## 2017-06-09 LAB — C-REACTIVE PROTEIN

## 2017-06-09 LAB — FOLATE: Folate: 11.5 ng/mL (ref >5.9)

## 2017-06-09 LAB — FERRITIN: FERRITIN: 35 ng/mL (ref 11–307)

## 2017-06-09 LAB — PATHOLOGIST SMEAR REVIEW

## 2017-06-09 LAB — VITAMIN B12: VITAMIN B 12: 306 pg/mL (ref 180–914)

## 2017-06-09 LAB — SEDIMENTATION RATE: Sed Rate: 55 mm/hr — ABNORMAL HIGH (ref 0–22)

## 2017-06-09 LAB — PROTIME-INR
INR: 1.08
PROTHROMBIN TIME: 13.9 s (ref 11.4–15.2)

## 2017-06-09 LAB — LACTATE DEHYDROGENASE: LDH: 165 U/L (ref 98–192)

## 2017-06-09 LAB — APTT: aPTT: 28 seconds (ref 24–36)

## 2017-06-09 MED ORDER — ACETAMINOPHEN 325 MG PO TABS
ORAL_TABLET | ORAL | Status: AC
  Filled 2017-06-09: qty 2

## 2017-06-09 MED ORDER — DIPHENHYDRAMINE HCL 25 MG PO CAPS
ORAL_CAPSULE | ORAL | Status: AC
  Filled 2017-06-09: qty 1

## 2017-06-09 MED ORDER — DIPHENHYDRAMINE HCL 25 MG PO CAPS
25.0000 mg | ORAL_CAPSULE | Freq: Once | ORAL | Status: AC
  Administered 2017-06-09: 25 mg via ORAL

## 2017-06-09 MED ORDER — SODIUM CHLORIDE 0.9 % IV SOLN
250.0000 mL | Freq: Once | INTRAVENOUS | Status: AC
  Administered 2017-06-09: 250 mL via INTRAVENOUS

## 2017-06-09 MED ORDER — ACETAMINOPHEN 325 MG PO TABS
650.0000 mg | ORAL_TABLET | Freq: Once | ORAL | Status: AC
  Administered 2017-06-09: 650 mg via ORAL
  Filled 2017-06-09: qty 2

## 2017-06-09 NOTE — Progress Notes (Signed)
Tolerated platelet transfusion w/o adverse reaction.  Alert, in no distress.  VSS.  Discharged via wheelchair in c/o son.

## 2017-06-09 NOTE — Progress Notes (Signed)
CRITICAL VALUE ALERT Critical value received:  Platelets-27 Date of notification:  06/09/17 Time of notification: [id number] Critical value read back:  Yes.   Nurse who received alert:  M.Maryjo Ragon, LPN MD notified (1st page):  S.Katragadda, MD

## 2017-06-09 NOTE — Progress Notes (Signed)
Referring physician:  Dr. Gerarda Fraction Cc:  Dr. Gerarda Fraction  Diagnosis Thrombocytopenia [facility]) - Plan: CBC with Differential/Platelet, Comprehensive metabolic panel, Lactate dehydrogenase, Protein electrophoresis, serum, Ferritin, Vitamin B12, Folate, Hepatitis B surface antibody, Hepatitis B surface antigen, Hepatitis B core antibody, total, Hepatitis panel, acute, HIV antibody (with reflex), Pathologist smear review, Protime-INR, APTT, Sedimentation rate, C-reactive protein, Rheumatoid factor, Rheumatoid factor, C-reactive protein, Sedimentation rate, APTT, Protime-INR, Pathologist smear review, HIV antibody (with reflex), Hepatitis panel, acute, Hepatitis B core antibody, total, Hepatitis B surface antigen, Hepatitis B surface antibody, Folate, Vitamin B12, Ferritin, Protein electrophoresis, serum, Lactate dehydrogenase, Comprehensive metabolic panel, CBC with Differential/Platelet, CBC with Differential/Platelet, CBC with Differential/Platelet, Comprehensive metabolic panel, Lactate dehydrogenase  Normocytic anemia - Plan: CBC with Differential/Platelet, Comprehensive metabolic panel, Lactate dehydrogenase, Protein electrophoresis, serum, Ferritin, Vitamin B12, Folate, Hepatitis B surface antibody, Hepatitis B surface antigen, Hepatitis B core antibody, total, Hepatitis panel, acute, HIV antibody (with reflex), Pathologist smear review, Protime-INR, APTT, Sedimentation rate, C-reactive protein, Rheumatoid factor, Rheumatoid factor, C-reactive protein, Sedimentation rate, APTT, Protime-INR, Pathologist smear review, HIV antibody (with reflex), Hepatitis panel, acute, Hepatitis B core antibody, total, Hepatitis B surface antigen, Hepatitis B surface antibody, Folate, Vitamin B12, Ferritin, Protein electrophoresis, serum, Lactate dehydrogenase, Comprehensive metabolic panel, CBC with Differential/Platelet, CBC with Differential/Platelet, CBC with Differential/Platelet, Comprehensive metabolic panel, Lactate  dehydrogenase  Solitary pulmonary nodule - Plan: NM PET Image Initial (PI) Skull Base To Thigh, CANCELED: NM PET Image Restag (PS) Skull Base To Thigh  Staging Cancer Staging No matching staging information was found for the patient.  Assessment and Plan:  1.  Thrombocytopenia.  77 yr old female referred for consultation due to anemia and thrombocytopenia.  She was previously seen by PA Kefalas 05/05/2015 for thrombocytopenia while hospitalized.  She has rheumatoid arthritis and is being treated with methotrexate.  Pt is also on baby ASA.   She has undergone SBRT by Dr. Lisbeth Renshaw to a right and left pulmonary nodule suspicious for primary bronchogenic carcinoma with hypermetabolic activity on PET imaging.  She received 54 Gy in 3 fractions to the right lung nodule and 50 Gy in 5 fractions to the left lung nodule.  She was followed by Dr. Audree Camel (medical oncology) at [facility].  Unclear when she was last seen by Dr. Jacquiline Doe.    She underwent colonoscopy by Dr. Oneida Alar 05/05/2015 with no obvious source for bleeding.    Pt has thrombocytopenia dating back to at least 2017.  She had a CT chest done 03/27/2017 that showed  IMPRESSION: 1. Stable bilateral upper lobe nodules with surrounding post treatment changes. 2.  Aortic atherosclerosis (ICD10-170.0). 3.  Emphysema (ICD10-J43.9).  Bilateral diagnostic mammogram done 03/01/2016 showed  IMPRESSION: 1. No mammographic evidence for primary breast malignancy. 2. Multiple enlarged axillary lymph nodes bilaterally, representing a change compared with earlier CT exams. 3. Considerations include lymphoproliferative disease, atypical metastatic disease from lung cancer, occult breast malignancy, or reactive lymph nodes. Psoriasis would be an unusual cause for lymphadenopathy without associated superinfection.  RECOMMENDATION: Consider ultrasound-guided core biopsy of one of the larger lymph nodes in the right axilla.  Pt  underwent USN core biopsy on 03/22/2016 that showed  Diagnosis Lymph node, needle/core biopsy, right axilla - BENIGN REACTIVE LYMPH NODE, SEE COMMENT. - NO EVIDENCE OF MALIGNANCY.   Outside labs done 05/30/2017 showed WBC 4.9, HB 8.7 and plts 43,000.  She has a normal differential Labs done 05/24/2017 showed WBC 5, HB 8.2 and plts 33,000 with normal differential.  HCV negative.  Ferritin 76.    Pt denies any fevers, chills, night sweats and has noted no adenopathy.  She reports occasional bruising.  She denies any bleeding.  Pt is seen today for consultation due to anemia and thrombocytopenia.    Labs reviewed with pt today shows WBC 4.1, HB 8.8, plts 27,000. Smear review shows normal differential with no fragmentation.  PT 13.9, PTT 28.  Cr 0.86.  LDH normal at 165.   Will ask for formal pathology review of peripheral smear.    In light of plts being 27,000, she will be transfused with 1 u pheresis plts.  She will have repeat labs next week.  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  Will discuss with PCP if no improvement in plt count after transfusion.  May also have to consider BM biopsy.  Pt instructed to hold MTX until labs repeated next week.    She will also be set up for PET scan in 1 week due to lung cancer history and adenopathy that was noted on mammogram.  She has undergone LN biopsy in 03/2016 that showed Reactive changes.  If additional abnormalities noted on PET scan She will be set up for biopsy for definitive diagnosis.  She should notify the office or report to ER if bleeding.    2.  Lung cancer.  She has undergone SBRT by Dr. Lisbeth Renshaw to a right and left pulmonary nodule suspicious for primary bronchogenic carcinoma with hypermetabolic activity on PET imaging.  She received 54 Gy in 3 fractions to the right lung nodule and 50 Gy in 5 fractions to the left lung nodule.  She was followed by Dr. Audree Camel  (medical oncology) at [facility].  Unclear when she was last seen by Dr. Jacquiline Doe.    She had a CT chest done 03/27/2017 that showed  IMPRESSION: 1. Stable bilateral upper lobe nodules with surrounding post treatment changes. 2.  Aortic atherosclerosis (ICD10-170.0). 3.  Emphysema (ICD10-J43.9).  She will also be set up for PET scan in 1 week due to lung cancer history and adenopathy that was noted on mammogram.  She has undergone LN biopsy in 03/2016 that showed Reactive changes.  If additional abnormalities noted on PET scan She will be set up for biopsy for definitive diagnosis.  3.  Adenopathy.  This was noted on Bilateral diagnostic mammogram done 03/01/2016 that showed  IMPRESSION: 1. No mammographic evidence for primary breast malignancy. 2. Multiple enlarged axillary lymph nodes bilaterally, representing a change compared with earlier CT exams. 3. Considerations include lymphoproliferative disease, atypical metastatic disease from lung cancer, occult breast malignancy, or reactive lymph nodes. Psoriasis would be an unusual cause for lymphadenopathy without associated superinfection.  RECOMMENDATION: Consider ultrasound-guided core biopsy of one of the larger lymph nodes in the right axilla.  Pt underwent USN core biopsy on 03/22/2016 that showed  Diagnosis Lymph node, needle/core biopsy, right axilla - BENIGN REACTIVE LYMPH NODE, SEE COMMENT. - NO EVIDENCE OF MALIGNANCY.  She will be set up for PET scan due to lung cancer and adenopathy for evaluation especially in light of thrombocytopenia.    4.  RA.  She will have labs for RF, CRP Sed rate.  Pt on MTX and will discuss with PCP due to worsening thrombocytopenia.  Pt instructed to hold MTX until repeat labs next week.    5.  Smoking.  Pt continues to smoke.  Cessation recommended especially  in light of lung cancer history.    6.  Anemia.  HB 8.8.  MCV 101.  Previous labs done in 2017  SPEP negative, B12  and folate normal.  Will repeat anemia workup today.  Pt has Cr 0.86.  Suspect anemia of chronic disease due to RA and medication effects.  She also has history of GI bleeding.  Recent Ferritin done 05/2017 was 76.    7.  HTN.  BP is 155/53.  Follow-up with PCP.   8. DM.  Follow-up with PCP.    9.  Health maintenance.  Pt has undergone colonoscopy in 2017.  Follow-up with GI as recommended.    HPI:  77 yr old female referred for consultation due to anemia and thrombocytopenia.  She was previously seen by PA Kefalas 05/05/2015 for thrombocytopenia while hospitalized.  She has rheumatoid arthritis and is being treated with methotrexate.  Pt is also on baby ASA.   She has undergone SBRT by Dr. Lisbeth Renshaw to a right and left pulmonary nodule suspicious for primary bronchogenic carcinoma with hypermetabolic activity on PET imaging.  She received 54 Gy in 3 fractions to the right lung nodule and 50 Gy in 5 fractions to the left lung nodule.  She was followed by Dr. Audree Camel (medical oncology) at [facility].  Unclear when she was last seen by Dr. Jacquiline Doe.    She underwent colonoscopy by Dr. Oneida Alar 05/05/2015 with no obvious source for bleeding.    Pt has thrombocytopenia dating back to at least 2017.  She had a CT chest done 03/27/2017 that showed  IMPRESSION: 1. Stable bilateral upper lobe nodules with surrounding post treatment changes. 2.  Aortic atherosclerosis (ICD10-170.0). 3.  Emphysema (ICD10-J43.9).  Bilateral diagnostic mammogram done 03/01/2016 showed  IMPRESSION: 1. No mammographic evidence for primary breast malignancy. 2. Multiple enlarged axillary lymph nodes bilaterally, representing a change compared with earlier CT exams. 3. Considerations include lymphoproliferative disease, atypical metastatic disease from lung cancer, occult breast malignancy, or reactive lymph nodes. Psoriasis would be an unusual cause for lymphadenopathy without associated  superinfection.  RECOMMENDATION: Consider ultrasound-guided core biopsy of one of the larger lymph nodes in the right axilla.  Pt underwent USN core biopsy on 03/22/2016 that showed  Diagnosis Lymph node, needle/core biopsy, right axilla - BENIGN REACTIVE LYMPH NODE, SEE COMMENT. - NO EVIDENCE OF MALIGNANCY.    Outside labs done 05/30/2017 showed WBC 4.9, HB 8.7 and plts 43,000.  She has a normal differential Labs done 05/24/2017 showed WBC 5, HB 8.2 and plts 33,000 with normal differential.  HCV negative.  Ferritin 76.     Pt denies any fevers, chills, night sweats and has noted no adenopathy.  She reports occasional bruising.  She denies any bleeding.  Pt is seen today for consultation due to anemia and thrombocytopenia.    Problem List Patient Active Problem List   Diagnosis Date Noted  . Primary malignant neoplasm of left upper lobe of lung ([place]) [C34.12] 08/11/2015  . Candida esophagitis ([place]) [B37.81] 07/01/2015  . Rectal bleeding [K62.5] 04/30/2015  . Colitis, acute [K52.9] 04/30/2015  . Viral gastroenteritis [A08.4] 04/01/2015  . Fracture of 5th metatarsal [S92.353A]   . Fracture of 4th metatarsal [S92.343A]   . Fracture of 3rd metatarsal [S92.333A]   . Fracture of 2nd metatarsal [S92.323A]   . Sepsis ([place]) [A41.9] 03/31/2015  . AKI (acute kidney injury) ([place]) [N17.9] 03/31/2015  . Leukocytosis [D72.829]   . Malignant neoplasm of right main bronchus ([place]) [C34.01] 02/12/2015  .  Thrombocytopenia, unspecified ([place]) [D69.6] 09/04/2013  . Normocytic anemia [D64.9] 09/04/2013  . Diabetes mellitus, type 2 ([place]) [E11.9] 09/03/2013  . Essential hypertension [I10] 09/03/2013  . Tobacco dependence [F17.200] 09/03/2013  . Shoulder pain [M25.519] 03/16/2011  . Stiffness of joint, not elsewhere classified,  shoulder region [M25.619] 03/16/2011  . Muscle weakness (generalized) [M62.81] 03/16/2011  . Rheumatoid arthritis ([place]) [M06.9] 08/16/2010    Past Medical History Past  Medical History:  Diagnosis Date  . Diabetes mellitus    x 20 yrs  . Fibromyalgia   . Fibromyalgia   . Lung cancer ([place]) 01/28/2015   left upper lobe lung /right upper lobe lung  . Lupus ([place])    sle  . Peripheral neuropathy   . RA (rheumatoid arthritis) ([place])    ra  . Thrombocytopenia, unspecified ([place]) 09/04/2013  . Tobacco use     Past Surgical History Past Surgical History:  Procedure Laterality Date  . ABDOMINAL HYSTERECTOMY    . APPENDECTOMY    . CATARACT EXTRACTION W/ INTRAOCULAR LENS IMPLANT  2010   right eye  . CESAREAN SECTION  x3  . COLONOSCOPY N/A 05/05/2015   Procedure: COLONOSCOPY;  Surgeon: Danie Binder, MD;  Location: [facility];  Service: Endoscopy;  Laterality: N/A;  . KNEE ARTHROSCOPY  yrs ago   left knee  . left arm surgery     multiple surgeries on  . OOPHORECTOMY    . SHOULDER HEMI-ARTHROPLASTY  01/27/2011   Procedure: SHOULDER HEMI-ARTHROPLASTY;  Surgeon: Nita Sells, MD;  Location: [facility];  Service: Orthopedics;  Laterality: Right;  interscaline right shoulder  . TRIGGER FINGER RELEASE  yrs ago   right hand    Family History Family History  Family history unknown: Yes     Social History  reports that she has been smoking cigarettes.  She has a 60.00 pack-year smoking history. She has never used smokeless tobacco. She reports that she does not drink alcohol or use drugs.  Medications  Current Outpatient Medications:  .  albuterol (PROVENTIL HFA;VENTOLIN HFA) 108 (90 BASE) MCG/ACT inhaler, Inhale 2 puffs into the lungs every 6 (six) hours as needed for wheezing or shortness of breath. Please instruct in usage., Disp: 1 Inhaler, Rfl: 0 .  alendronate (FOSAMAX) 70 MG tablet, Take 1 tablet by mouth Once a week. On Sunday, Disp: , Rfl:  .  aspirin EC 81 MG tablet, Take 1 tablet (81 mg total) by mouth daily. Restart 4/21, Disp: , Rfl:  .  folic acid (FOLVITE) 1 MG tablet, Take 1 mg by mouth daily., Disp: , Rfl:  .  glimepiride  (AMARYL) 4 MG tablet, , Disp: , Rfl:  .  lisinopril (PRINIVIL,ZESTRIL) 2.5 MG tablet, , Disp: , Rfl: 3 .  methotrexate 2.5 MG tablet, Take 7.5 mg by mouth 2 (two) times a week., Disp: , Rfl:  .  Multiple Vitamins-Minerals (PRESERVISION AREDS 2) CAPS, Take 2 capsules by mouth 2 (two) times daily., Disp: , Rfl:  .  naproxen sodium (ALEVE) 220 MG tablet, Take by mouth., Disp: , Rfl:  .  polycarbophil (FIBERCON) 625 MG tablet, Take 2 tablets (1,250 mg total) by mouth daily with supper., Disp: , Rfl:  .  tetrahydrozoline-zinc (VISINE-AC) 0.05-0.25 % ophthalmic solution, Place 2 drops into both eyes daily as needed (dry eyes)., Disp: , Rfl:  .  glipiZIDE-metformin (METAGLIP) 5-500 MG tablet, , Disp: , Rfl:  .  metoprolol tartrate (LOPRESSOR) 25 MG tablet, Take 25 mg by mouth daily. Reported on 07/28/2015, Disp: ,  Rfl:  No current facility-administered medications for this visit.   Facility-Administered Medications Ordered in Other Visits:  .  0.9 %  sodium chloride infusion, , Intravenous, Continuous, Hurley Cisco, MD, Last Rate: 20 mL/hr at 10/04/10 1339 .  0.9 %  sodium chloride infusion, 250 mL, Intravenous, Once, Chantrice Hagg, MD  Allergies Bupropion and Lopid  [gemfibrozil]  Review of Systems Review of Systems - Oncology ROS as per HPI otherwise 12 point ROS is negative.   Physical Exam  Vitals Wt Readings from Last 3 Encounters:  06/09/17 146 lb (66.2 kg)  02/17/16 134 lb 9.6 oz (61.1 kg)  07/28/15 136 lb 14.4 oz (62.1 kg)   Temp Readings from Last 3 Encounters:  06/09/17 98.4 F (36.9 C) (Oral)  03/22/16 98 F (36.7 C) (Oral)  02/17/16 97.9 F (36.6 C) (Oral)   BP Readings from Last 3 Encounters:  06/09/17 (!) 155/53  03/22/16 (!) 152/65  02/17/16 (!) 141/57   Pulse Readings from Last 3 Encounters:  06/09/17 93  03/22/16 80  02/17/16 90   Constitutional: Well-developed, well-nourished, and in no distress.   HENT: Head: Normocephalic and atraumatic.   Mouth/Throat: No oropharyngeal exudate. Mucosa moist. Eyes: Pupils are equal, round, and reactive to light. Conjunctivae are normal. No scleral icterus.  Neck: Normal range of motion. Neck supple. No JVD present.  Cardiovascular: Normal rate, regular rhythm and normal heart sounds.  Exam reveals no gallop and no friction rub.   No murmur heard. Pulmonary/Chest: Effort normal and breath sounds normal. No respiratory distress. No wheezes.No rales.  Abdominal: Soft. Bowel sounds are normal. No distension. There is no tenderness. There is no guarding.  Musculoskeletal: No edema or tenderness.  Lymphadenopathy: No cervical, axillary or supraclavicular adenopathy. Pt reports some tenderness in right axilla from previous biopsy.   Neurological: Alert and oriented to person, place, and time. No cranial nerve deficit.  Skin: Skin is warm and dry. No rash noted. No erythema. No pallor.  Psychiatric: Affect and judgment normal.   Labs Infusion on 06/09/2017  Component Date Value Ref Range Status  . Unit Number 06/09/2017 [id number]   Final  . Blood Component Type 06/09/2017 [facility] LRI1   Final  . Unit division 06/09/2017 00   Final  . Status of Unit 06/09/2017 ALLOCATED   Final  . Transfusion Status 06/09/2017    Final                   Value:OK TO TRANSFUSE Performed at [facility], [address] [place], [place] [address]   . Blood Product Unit Number 06/09/2017 [id number]   Final  . Unit Type and Rh 06/09/2017 5100   Final  . Blood Product Expiration Date 06/09/2017 440102725366   Final  Office Visit on 06/09/2017  Component Date Value Ref Range Status  . Sed Rate 06/09/2017 55* 0 - 22 mm/hr Final   Performed at [facility], [address], [place], [place] [address]  . aPTT 06/09/2017 28  24 - 36 seconds Final   Performed at [facility], [address], [place], [place] [address]  . Prothrombin Time 06/09/2017 13.9  11.4 - 15.2 seconds Final  . INR 06/09/2017 1.08   Final    Performed at [facility], [address] [place], [place] [address]  . LDH 06/09/2017 165  98 - 192 U/L Final   Performed at [facility] [address], [place], [place] [address]  . Sodium 06/09/2017 134* 135 - 145 mmol/L Final  .  Potassium 06/09/2017 4.4  3.5 - 5.1 mmol/L Final  . Chloride 06/09/2017 101  101 - 111 mmol/L Final  . CO2 06/09/2017 25  22 - 32 mmol/L Final  . Glucose, Bld 06/09/2017 131* 65 - 99 mg/dL Final  . BUN 06/09/2017 28* 6 - 20 mg/dL Final  . Creatinine, Ser 06/09/2017 0.86  0.44 - 1.00 mg/dL Final  . Calcium 06/09/2017 9.3  8.9 - 10.3 mg/dL Final  . Total Protein 06/09/2017 7.8  6.5 - 8.1 g/dL Final  . Albumin 06/09/2017 3.7  3.5 - 5.0 g/dL Final  . AST 06/09/2017 17  15 - 41 U/L Final  . ALT 06/09/2017 11* 14 - 54 U/L Final  . Alkaline Phosphatase 06/09/2017 73  38 - 126 U/L Final  . Total Bilirubin 06/09/2017 0.6  0.3 - 1.2 mg/dL Final  . GFR calc non Af Amer 06/09/2017 >60  >60 mL/min Final  . GFR calc Af Amer 06/09/2017 >60  >60 mL/min Final   Comment: (NOTE) The eGFR has been calculated using the CKD EPI equation. This calculation has not been validated in all clinical situations. eGFR's persistently <60 mL/min signify possible Chronic Kidney Disease.   Georgiann Hahn gap 06/09/2017 8  5 - 15 Final   Performed at [facility], [address] [place], [place] [address]  . WBC 06/09/2017 4.1  4.0 - 10.5 K/uL Final  . RBC 06/09/2017 2.86* 3.87 - 5.11 MIL/uL Final  . Hemoglobin 06/09/2017 8.8* 12.0 - 15.0 g/dL Final  . HCT 06/09/2017 29.0* 36.0 - 46.0 % Final  . MCV 06/09/2017 101.4* 78.0 - 100.0 fL Final  . MCH 06/09/2017 30.8  26.0 - 34.0 pg Final  . MCHC 06/09/2017 30.3  30.0 - 36.0 g/dL Final  . RDW 06/09/2017 19.9* 11.5 - 15.5 % Final  . Platelets 06/09/2017 27* 150 - 400 K/uL Final   Comment: REPEATED TO VERIFY CRITICAL RESULT CALLED TO, READ BACK BY AND VERIFIED WITH: MBARNES AT 1027 BY HFLYNT 06/09/17 PLATELET COUNT CONFIRMED BY SMEAR SPECIMEN  CHECKED FOR CLOTS   . Neutrophils Relative % 06/09/2017 58  % Final  . Neutro Abs 06/09/2017 2.4  1.7 - 7.7 K/uL Final  . Lymphocytes Relative 06/09/2017 23  % Final  . Lymphs Abs 06/09/2017 0.9  0.7 - 4.0 K/uL Final  . Monocytes Relative 06/09/2017 17  % Final  . Monocytes Absolute 06/09/2017 0.7  0.1 - 1.0 K/uL Final  . Eosinophils Relative 06/09/2017 2  % Final  . Eosinophils Absolute 06/09/2017 0.1  0.0 - 0.7 K/uL Final  . Basophils Relative 06/09/2017 0  % Final  . Basophils Absolute 06/09/2017 0.0  0.0 - 0.1 K/uL Final   Performed at [facility], [address] [place], [place] [address]     Pathology Orders Placed This Encounter  Procedures  . NM PET Image Initial (PI) Skull Base To Thigh    Standing Status:   Future    Standing Expiration Date:   06/09/2018    Order Specific Question:   If indicated for the ordered procedure, I authorize the administration of a radiopharmaceutical per Radiology protocol    Answer:   Yes    Order Specific Question:   Preferred imaging location?    Answer:   [facility]    Order Specific Question:   Radiology Contrast Protocol - do NOT remove file path    Answer:   [email or web address]  . CBC with Differential/Platelet    Standing Status:   Future  Number of Occurrences:   1    Standing Expiration Date:   06/10/2018  . Comprehensive metabolic panel    Standing Status:   Future    Number of Occurrences:   1    Standing Expiration Date:   06/10/2018  . Lactate dehydrogenase    Standing Status:   Future    Number of Occurrences:   1    Standing Expiration Date:   06/10/2018  . Protein electrophoresis, serum    Standing Status:   Future    Number of Occurrences:   1    Standing Expiration Date:   06/10/2018  . Ferritin    Standing Status:   Future    Number of Occurrences:   1    Standing Expiration Date:   06/10/2018  . Vitamin B12    Standing Status:   Future    Number of Occurrences:   1    Standing  Expiration Date:   06/10/2018  . Folate    Standing Status:   Future    Number of Occurrences:   1    Standing Expiration Date:   06/10/2018  . Hepatitis B surface antibody    Standing Status:   Future    Number of Occurrences:   1    Standing Expiration Date:   06/10/2018  . Hepatitis B surface antigen    Standing Status:   Future    Number of Occurrences:   1    Standing Expiration Date:   06/10/2018  . Hepatitis B core antibody, total    Standing Status:   Future    Number of Occurrences:   1    Standing Expiration Date:   06/10/2018  . Hepatitis panel, acute    Standing Status:   Future    Number of Occurrences:   1    Standing Expiration Date:   06/10/2018  . HIV antibody (with reflex)    Standing Status:   Future    Number of Occurrences:   1    Standing Expiration Date:   06/10/2018  . Pathologist smear review    Standing Status:   Future    Number of Occurrences:   1    Standing Expiration Date:   06/10/2018  . Protime-INR    Standing Status:   Future    Number of Occurrences:   1    Standing Expiration Date:   06/10/2018  . APTT    Standing Status:   Future    Number of Occurrences:   1    Standing Expiration Date:   06/10/2018  . Sedimentation rate    Standing Status:   Future    Number of Occurrences:   1    Standing Expiration Date:   06/10/2018  . C-reactive protein    Standing Status:   Future    Number of Occurrences:   1    Standing Expiration Date:   06/10/2018  . Rheumatoid factor    Standing Status:   Future    Number of Occurrences:   1    Standing Expiration Date:   06/10/2018  . CBC with Differential/Platelet    Standing Status:   Future    Standing Expiration Date:   06/10/2018  . CBC with Differential/Platelet    Standing Status:   Future    Standing Expiration Date:   06/10/2018  . Comprehensive metabolic panel    Standing Status:   Future    Standing Expiration Date:  06/10/2018  . Lactate dehydrogenase    Standing Status:   Future    Standing  Expiration Date:   06/10/2018       Zoila Shutter MD

## 2017-06-10 LAB — BPAM PLATELET PHERESIS
BLOOD PRODUCT EXPIRATION DATE: 201905252359
ISSUE DATE / TIME: 201905241140
UNIT TYPE AND RH: 5100

## 2017-06-10 LAB — HEPATITIS PANEL, ACUTE
HCV Ab: <0.1 s/co ratio (ref 0.0–0.9)
HEP B C IGM: NEGATIVE
Hep A IgM: NEGATIVE
Hepatitis B Surface Ag: NEGATIVE

## 2017-06-10 LAB — HEPATITIS B SURFACE ANTIGEN: HEP B S AG: NEGATIVE

## 2017-06-10 LAB — PREPARE PLATELET PHERESIS: UNIT DIVISION: 0

## 2017-06-10 LAB — RHEUMATOID FACTOR: Rhuematoid fact SerPl-aCnc: 60.2 IU/mL — ABNORMAL HIGH (ref 0.0–13.9)

## 2017-06-10 LAB — HEPATITIS B SURFACE ANTIBODY,QUALITATIVE: HEP B S AB: NONREACTIVE

## 2017-06-10 LAB — HEPATITIS B CORE ANTIBODY, TOTAL: HEP B C TOTAL AB: NEGATIVE

## 2017-06-10 LAB — HIV ANTIBODY (ROUTINE TESTING W REFLEX): HIV Screen 4th Generation wRfx: NONREACTIVE

## 2017-06-13 LAB — PROTEIN ELECTROPHORESIS, SERUM
A/G RATIO SPE: 1.1 (ref 0.7–1.7)
Albumin ELP: 3.8 g/dL (ref 2.9–4.4)
Alpha-1-Globulin: 0.2 g/dL (ref 0.0–0.4)
Alpha-2-Globulin: 0.7 g/dL (ref 0.4–1.0)
Beta Globulin: 1 g/dL (ref 0.7–1.3)
GLOBULIN, TOTAL: 3.4 g/dL (ref 2.2–3.9)
Gamma Globulin: 1.5 g/dL (ref 0.4–1.8)
TOTAL PROTEIN ELP: 7.2 g/dL (ref 6.0–8.5)

## 2017-06-15 ENCOUNTER — Inpatient Hospital Stay (HOSPITAL_COMMUNITY): Payer: Medicare Other

## 2017-06-15 DIAGNOSIS — D696 Thrombocytopenia, unspecified: Secondary | ICD-10-CM | POA: Diagnosis not present

## 2017-06-15 DIAGNOSIS — D649 Anemia, unspecified: Secondary | ICD-10-CM

## 2017-06-15 DIAGNOSIS — F17210 Nicotine dependence, cigarettes, uncomplicated: Secondary | ICD-10-CM | POA: Diagnosis not present

## 2017-06-15 DIAGNOSIS — Z7984 Long term (current) use of oral hypoglycemic drugs: Secondary | ICD-10-CM | POA: Diagnosis not present

## 2017-06-15 DIAGNOSIS — R911 Solitary pulmonary nodule: Secondary | ICD-10-CM | POA: Diagnosis not present

## 2017-06-15 DIAGNOSIS — E119 Type 2 diabetes mellitus without complications: Secondary | ICD-10-CM | POA: Diagnosis not present

## 2017-06-15 LAB — CBC WITH DIFFERENTIAL/PLATELET
Basophils Absolute: 0 10*3/uL (ref 0.0–0.1)
Basophils Relative: 0 %
Eosinophils Absolute: 0.1 10*3/uL (ref 0.0–0.7)
Eosinophils Relative: 1 %
HEMATOCRIT: 30.8 % — AB (ref 36.0–46.0)
HEMOGLOBIN: 9.5 g/dL — AB (ref 12.0–15.0)
LYMPHS ABS: 1.3 10*3/uL (ref 0.7–4.0)
LYMPHS PCT: 27 %
MCH: 30.8 pg (ref 26.0–34.0)
MCHC: 30.8 g/dL (ref 30.0–36.0)
MCV: 100 fL (ref 78.0–100.0)
MONOS PCT: 13 %
Monocytes Absolute: 0.6 10*3/uL (ref 0.1–1.0)
NEUTROS ABS: 2.8 10*3/uL (ref 1.7–7.7)
Neutrophils Relative %: 59 %
Platelets: 38 10*3/uL — ABNORMAL LOW (ref 150–400)
RBC: 3.08 MIL/uL — ABNORMAL LOW (ref 3.87–5.11)
RDW: 19.2 % — ABNORMAL HIGH (ref 11.5–15.5)
WBC: 4.7 10*3/uL (ref 4.0–10.5)

## 2017-06-20 ENCOUNTER — Inpatient Hospital Stay (HOSPITAL_COMMUNITY): Payer: Medicare Other | Attending: Hematology

## 2017-06-20 DIAGNOSIS — E119 Type 2 diabetes mellitus without complications: Secondary | ICD-10-CM | POA: Insufficient documentation

## 2017-06-20 DIAGNOSIS — D696 Thrombocytopenia, unspecified: Secondary | ICD-10-CM | POA: Insufficient documentation

## 2017-06-20 DIAGNOSIS — Z923 Personal history of irradiation: Secondary | ICD-10-CM | POA: Diagnosis not present

## 2017-06-20 DIAGNOSIS — Z7982 Long term (current) use of aspirin: Secondary | ICD-10-CM | POA: Insufficient documentation

## 2017-06-20 DIAGNOSIS — Z79899 Other long term (current) drug therapy: Secondary | ICD-10-CM | POA: Insufficient documentation

## 2017-06-20 DIAGNOSIS — F17210 Nicotine dependence, cigarettes, uncomplicated: Secondary | ICD-10-CM | POA: Insufficient documentation

## 2017-06-20 DIAGNOSIS — Z85118 Personal history of other malignant neoplasm of bronchus and lung: Secondary | ICD-10-CM | POA: Diagnosis not present

## 2017-06-20 DIAGNOSIS — D500 Iron deficiency anemia secondary to blood loss (chronic): Secondary | ICD-10-CM | POA: Diagnosis not present

## 2017-06-20 DIAGNOSIS — Z7984 Long term (current) use of oral hypoglycemic drugs: Secondary | ICD-10-CM | POA: Diagnosis not present

## 2017-06-20 DIAGNOSIS — D649 Anemia, unspecified: Secondary | ICD-10-CM

## 2017-06-20 LAB — CBC WITH DIFFERENTIAL/PLATELET
BASOS PCT: 0 %
Basophils Absolute: 0 10*3/uL (ref 0.0–0.1)
EOS ABS: 0.1 10*3/uL (ref 0.0–0.7)
Eosinophils Relative: 2 %
HCT: 32.2 % — ABNORMAL LOW (ref 36.0–46.0)
HEMOGLOBIN: 9.8 g/dL — AB (ref 12.0–15.0)
LYMPHS ABS: 1.3 10*3/uL (ref 0.7–4.0)
Lymphocytes Relative: 24 %
MCH: 30.5 pg (ref 26.0–34.0)
MCHC: 30.4 g/dL (ref 30.0–36.0)
MCV: 100.3 fL — ABNORMAL HIGH (ref 78.0–100.0)
Monocytes Absolute: 0.7 10*3/uL (ref 0.1–1.0)
Monocytes Relative: 13 %
NEUTROS ABS: 3.3 10*3/uL (ref 1.7–7.7)
NEUTROS PCT: 61 %
Platelets: 41 10*3/uL — ABNORMAL LOW (ref 150–400)
RBC: 3.21 MIL/uL — AB (ref 3.87–5.11)
RDW: 18.5 % — ABNORMAL HIGH (ref 11.5–15.5)
WBC: 5.5 10*3/uL (ref 4.0–10.5)

## 2017-06-20 LAB — COMPREHENSIVE METABOLIC PANEL
ALBUMIN: 3.8 g/dL (ref 3.5–5.0)
ALK PHOS: 93 U/L (ref 38–126)
ALT: 12 U/L — AB (ref 14–54)
AST: 19 U/L (ref 15–41)
Anion gap: 8 (ref 5–15)
BUN: 23 mg/dL — ABNORMAL HIGH (ref 6–20)
CALCIUM: 9 mg/dL (ref 8.9–10.3)
CO2: 24 mmol/L (ref 22–32)
CREATININE: 1.01 mg/dL — AB (ref 0.44–1.00)
Chloride: 100 mmol/L — ABNORMAL LOW (ref 101–111)
GFR calc Af Amer: >60 mL/min (ref >60)
GFR calc non Af Amer: 53 mL/min — ABNORMAL LOW (ref >60)
GLUCOSE: 131 mg/dL — AB (ref 65–99)
Potassium: 4.6 mmol/L (ref 3.5–5.1)
Sodium: 132 mmol/L — ABNORMAL LOW (ref 135–145)
Total Bilirubin: 0.6 mg/dL (ref 0.3–1.2)
Total Protein: 7.7 g/dL (ref 6.5–8.1)

## 2017-06-20 LAB — LACTATE DEHYDROGENASE: LDH: 162 U/L (ref 98–192)

## 2017-06-21 ENCOUNTER — Inpatient Hospital Stay (HOSPITAL_BASED_OUTPATIENT_CLINIC_OR_DEPARTMENT_OTHER): Payer: Medicare Other | Admitting: Internal Medicine

## 2017-06-21 ENCOUNTER — Encounter (HOSPITAL_COMMUNITY): Payer: Self-pay | Admitting: Internal Medicine

## 2017-06-21 ENCOUNTER — Other Ambulatory Visit (HOSPITAL_COMMUNITY): Payer: Medicare Other

## 2017-06-21 DIAGNOSIS — D696 Thrombocytopenia, unspecified: Secondary | ICD-10-CM

## 2017-06-21 DIAGNOSIS — D500 Iron deficiency anemia secondary to blood loss (chronic): Secondary | ICD-10-CM | POA: Diagnosis not present

## 2017-06-21 DIAGNOSIS — F17210 Nicotine dependence, cigarettes, uncomplicated: Secondary | ICD-10-CM

## 2017-06-21 DIAGNOSIS — Z923 Personal history of irradiation: Secondary | ICD-10-CM

## 2017-06-21 DIAGNOSIS — Z7984 Long term (current) use of oral hypoglycemic drugs: Secondary | ICD-10-CM | POA: Diagnosis not present

## 2017-06-21 DIAGNOSIS — Z79899 Other long term (current) drug therapy: Secondary | ICD-10-CM

## 2017-06-21 DIAGNOSIS — E119 Type 2 diabetes mellitus without complications: Secondary | ICD-10-CM | POA: Diagnosis not present

## 2017-06-21 DIAGNOSIS — Z7982 Long term (current) use of aspirin: Secondary | ICD-10-CM | POA: Diagnosis not present

## 2017-06-21 DIAGNOSIS — Z85118 Personal history of other malignant neoplasm of bronchus and lung: Secondary | ICD-10-CM

## 2017-06-21 NOTE — Patient Instructions (Signed)
[place] at [facility]  Discharge Instructions:  You were seen by dr. Walden Field today,  _______________________________________________________________  Thank you for choosing [place] at [facility] to provide your oncology and hematology care.  To afford each patient quality time with our providers, please arrive at least 15 minutes before your scheduled appointment.  You need to re-schedule your appointment if you arrive 10 or more minutes late.  We strive to give you quality time with our providers, and arriving late affects you and other patients whose appointments are after yours.  Also, if you no show three or more times for appointments you may be dismissed from the clinic.  Again, thank you for choosing [place] at [place] hope is that these requests will allow you access to exceptional care and in a timely manner. _______________________________________________________________  If you have questions after your visit, please contact our office at [phone] between the hours of 8:30 a.m. and 5:00 p.m. Voicemails left after 4:30 p.m. will not be returned until the following business day. _______________________________________________________________  For prescription refill requests, have your pharmacy contact our office. _______________________________________________________________  Recommendations made by the consultant and any test results will be sent to your referring physician. _______________________________________________________________

## 2017-06-21 NOTE — Progress Notes (Signed)
Diagnosis Iron deficiency anemia due to chronic blood loss  Staging Cancer Staging No matching staging information was found for the patient.  Assessment and Plan:  1.  Thrombocytopenia.  77 yr old female referred for consultation due to anemia and thrombocytopenia.  She was previously seen by PA Kefalas 05/05/2015 for thrombocytopenia while hospitalized.  She has rheumatoid arthritis and was being treated with methotrexate.  Pt is also on baby ASA.   She has undergone SBRT by Dr. Lisbeth Renshaw to a right and left pulmonary nodule suspicious for primary bronchogenic carcinoma with hypermetabolic activity on PET imaging.  She received 54 Gy in 3 fractions to the right lung nodule and 50 Gy in 5 fractions to the left lung nodule.  She was followed by Dr. Audree Camel (medical oncology) at [facility].  Unclear when she was last seen by Dr. Jacquiline Doe.    She underwent colonoscopy by Dr. Oneida Alar 05/05/2015 with no obvious source for bleeding.    Pt has thrombocytopenia dating back to at least 2017.  She had a CT chest done 03/27/2017 that showed  IMPRESSION: 1. Stable bilateral upper lobe nodules with surrounding post treatment changes. 2.  Aortic atherosclerosis (ICD10-170.0). 3.  Emphysema (ICD10-J43.9).  Bilateral diagnostic mammogram done 03/01/2016 showed  IMPRESSION: 1. No mammographic evidence for primary breast malignancy. 2. Multiple enlarged axillary lymph nodes bilaterally, representing a change compared with earlier CT exams. 3. Considerations include lymphoproliferative disease, atypical metastatic disease from lung cancer, occult breast malignancy, or reactive lymph nodes. Psoriasis would be an unusual cause for lymphadenopathy without associated superinfection.  RECOMMENDATION: Consider ultrasound-guided core biopsy of one of the larger lymph nodes in the right axilla.  Pt underwent USN core biopsy on 03/22/2016 that showed  Diagnosis Lymph node, needle/core  biopsy, right axilla - BENIGN REACTIVE LYMPH NODE, SEE COMMENT. - NO EVIDENCE OF MALIGNANCY.   Outside labs done 05/30/2017 showed WBC 4.9, HB 8.7 and plts 43,000.  She has a normal differential Labs done 05/24/2017 showed WBC 5, HB 8.2 and plts 33,000 with normal differential.  HCV negative.  Ferritin 76.    Pt denies any fevers, chills, night sweats and has noted no adenopathy.  She reports occasional bruising.  She denies any bleeding.  Pt was transfused with I u pheresis plts on 06/09/2017 due to thrombocytopenia.    Labs done 06/20/2017 reviewed with pt and show WBC 5.5 HB 9.8 and plts 41,000.  LDH 162 which is improved from labs done 06/15/2017 which showed WBC 4.1, HB 8.8, plts 27,000. Smear review shows normal differential with no fragmentation.  PT 13.9, PTT 28.  Cr 0.86.  LDH normal at 165.  Formal pathology review of peripheral smear showed macrocytic anemia and thrombocytopenia.    In light of plts being 27,000, she was transfused with 1 u pheresis plts on 06/09/2017.  Plt count has improved and is 41,000 today.    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  She was instructed to continue to hold MTX and plt count is slowly improving.  Will notify Dr. Gerarda Fraction of this and repeat labs in 2-3 weeks for ongoing follow-up.    If counts worsen or fail to improve, will consider BM biopsy.    2.  Adenopathy. PET scan scheduled for 06/26/2017 due to lung cancer history and adenopathy that was noted on mammogram.  This was noted on Bilateral diagnostic mammogram done 03/01/2016 that showed  IMPRESSION:  1. No mammographic evidence for primary breast malignancy. 2. Multiple enlarged axillary lymph nodes bilaterally, representing a change compared with earlier CT exams. 3. Considerations include lymphoproliferative disease, atypical metastatic disease from lung cancer, occult breast malignancy, or reactive lymph nodes. Psoriasis  would be an unusual cause for lymphadenopathy without associated superinfection.  RECOMMENDATION: Consider ultrasound-guided core biopsy of one of the larger lymph nodes in the right axilla.  Pt underwent USN core biopsy on 03/22/2016 that showed  Diagnosis Lymph node, needle/core biopsy, right axilla - BENIGN REACTIVE LYMPH NODE, SEE COMMENT. - NO EVIDENCE OF MALIGNANCY.  If additional abnormalities noted on PET scan she will be set up for biopsy for definitive diagnosis.   3.  Lung cancer.  She has undergone SBRT by Dr. Lisbeth Renshaw to a right and left pulmonary nodule suspicious for primary bronchogenic carcinoma with hypermetabolic activity on PET imaging.  She received 54 Gy in 3 fractions to the right lung nodule and 50 Gy in 5 fractions to the left lung nodule.  She was followed by Dr. Audree Camel (medical oncology) at [facility].  Unclear when she was last seen by Dr. Jacquiline Doe.    She had a CT chest done 03/27/2017 that showed  IMPRESSION: 1. Stable bilateral upper lobe nodules with surrounding post treatment changes. 2.  Aortic atherosclerosis (ICD10-170.0). 3.  Emphysema (ICD10-J43.9).  PET scan scheduled for 06/26/2017 due to lung cancer history and adenopathy that was noted on mammogram.  She has undergone LN biopsy in 03/2016 that showed Reactive changes.  If additional abnormalities noted on PET scan She will be set up for biopsy for definitive diagnosis.   4.  RA.  She will have labs for RF which was elevated at 60 , CRP normal,  Sed rate 55.  Pt on MTX and  Pt instructed to hold MTX until further notice.  Will notify Dr. Gerarda Fraction of this.    5.  Smoking.  Pt continues to smoke.  Cessation recommended especially in light of lung cancer history.  Pt set up for PET scan.    6.  IDA.   Labs done 06/20/2017 show HB 9.8.  Ferritin was decreased at 35.  Pt will be treated with IV iron with Injectafer 750 mg IV D1 and D8.  Previously, HB 8.8.  MCV 101. SPEP negative.    Previous labs done in 2017  SPEP negative, B12 and folate normal.  Pt has Cr 0.86.  Suspect anemia of chronic disease due to RA and medication effects.  She also has history of GI bleeding.  Will repeat labs after IV iron completed.    7.  HTN.  BP is 149/112.  Follow-up with PCP.   8. DM.  Follow-up with PCP.    9.  Health maintenance.  Pt has undergone colonoscopy in 2017.  Follow-up with GI as recommended.    Interval history:  77 yr old female referred for consultation due to anemia and thrombocytopenia.  She was previously seen by PA Kefalas 05/05/2015 for thrombocytopenia while hospitalized.  She has rheumatoid arthritis and is being treated with methotrexate.  Pt is also on baby ASA.   She has undergone SBRT by Dr. Lisbeth Renshaw to a right and left pulmonary nodule suspicious for primary bronchogenic carcinoma with hypermetabolic activity on PET imaging.  She received 54 Gy in 3 fractions to the right lung nodule and 50 Gy in 5 fractions to the left lung nodule.  She was followed by Dr. Audree Camel (medical oncology) at  [facility].  Unclear when she was last seen by Dr. Jacquiline Doe.    She underwent colonoscopy by Dr. Oneida Alar 05/05/2015 with no obvious source for bleeding.    Pt has thrombocytopenia dating back to at least 2017.  She had a CT chest done 03/27/2017 that showed  IMPRESSION: 1. Stable bilateral upper lobe nodules with surrounding post treatment changes. 2.  Aortic atherosclerosis (ICD10-170.0). 3.  Emphysema (ICD10-J43.9).  Bilateral diagnostic mammogram done 03/01/2016 showed  IMPRESSION: 1. No mammographic evidence for primary breast malignancy. 2. Multiple enlarged axillary lymph nodes bilaterally, representing a change compared with earlier CT exams. 3. Considerations include lymphoproliferative disease, atypical metastatic disease from lung cancer, occult breast malignancy, or reactive lymph nodes. Psoriasis would be an unusual cause for lymphadenopathy  without associated superinfection.  RECOMMENDATION: Consider ultrasound-guided core biopsy of one of the larger lymph nodes in the right axilla.  Pt underwent USN core biopsy on 03/22/2016 that showed  Diagnosis Lymph node, needle/core biopsy, right axilla - BENIGN REACTIVE LYMPH NODE, SEE COMMENT. - NO EVIDENCE OF MALIGNANCY.    Outside labs done 05/30/2017 showed WBC 4.9, HB 8.7 and plts 43,000.  She has a normal differential Labs done 05/24/2017 showed WBC 5, HB 8.2 and plts 33,000 with normal differential.  HCV negative.  Ferritin 76.    Due to thrombocytopenia she as transfused with 1 u pheresis plts on 06/09/2017.  Current status:  Pt is seen today for follow-up.  She is here to go over labs.  She remains off MTX.    Problem List Patient Active Problem List   Diagnosis Date Noted  . Iron deficiency anemia due to chronic blood loss [D50.0] 06/21/2017  . Primary malignant neoplasm of left upper lobe of lung ([place]) [C34.12] 08/11/2015  . Candida esophagitis ([place]) [B37.81] 07/01/2015  . Rectal bleeding [K62.5] 04/30/2015  . Colitis, acute [K52.9] 04/30/2015  . Viral gastroenteritis [A08.4] 04/01/2015  . Fracture of 5th metatarsal [S92.353A]   . Fracture of 4th metatarsal [S92.343A]   . Fracture of 3rd metatarsal [S92.333A]   . Fracture of 2nd metatarsal [S92.323A]   . Sepsis ([place]) [A41.9] 03/31/2015  . AKI (acute kidney injury) ([place]) [N17.9] 03/31/2015  . Leukocytosis [D72.829]   . Malignant neoplasm of right main bronchus ([place]) [C34.01] 02/12/2015  . Thrombocytopenia, unspecified ([place]) [D69.6] 09/04/2013  . Normocytic anemia [D64.9] 09/04/2013  . Diabetes mellitus, type 2 ([place]) [E11.9] 09/03/2013  . Essential hypertension [I10] 09/03/2013  . Tobacco dependence [F17.200] 09/03/2013  . Shoulder pain [M25.519] 03/16/2011  . Stiffness of joint, not elsewhere classified,  shoulder region [M25.619] 03/16/2011  . Muscle weakness (generalized) [M62.81] 03/16/2011  . Rheumatoid  arthritis ([place]) [M06.9] 08/16/2010    Past Medical History Past Medical History:  Diagnosis Date  . Diabetes mellitus    x 20 yrs  . Fibromyalgia   . Fibromyalgia   . Lung cancer ([place]) 01/28/2015   left upper lobe lung /right upper lobe lung  . Lupus ([place])    sle  . Peripheral neuropathy   . RA (rheumatoid arthritis) ([place])    ra  . Thrombocytopenia, unspecified ([place]) 09/04/2013  . Tobacco use     Past Surgical History Past Surgical History:  Procedure Laterality Date  . ABDOMINAL HYSTERECTOMY    . APPENDECTOMY    . CATARACT EXTRACTION W/ INTRAOCULAR LENS IMPLANT  2010   right eye  . CESAREAN SECTION  x3  . COLONOSCOPY N/A 05/05/2015   Procedure: COLONOSCOPY;  Surgeon: Danie Binder, MD;  Location:  [facility];  Service: Endoscopy;  Laterality: N/A;  . KNEE ARTHROSCOPY  yrs ago   left knee  . left arm surgery     multiple surgeries on  . OOPHORECTOMY    . SHOULDER HEMI-ARTHROPLASTY  01/27/2011   Procedure: SHOULDER HEMI-ARTHROPLASTY;  Surgeon: Nita Sells, MD;  Location: [facility];  Service: Orthopedics;  Laterality: Right;  interscaline right shoulder  . TRIGGER FINGER RELEASE  yrs ago   right hand    Family History Family History  Family history unknown: Yes     Social History  reports that she has been smoking cigarettes.  She has a 60.00 pack-year smoking history. She has never used smokeless tobacco. She reports that she does not drink alcohol or use drugs.  Medications  Current Outpatient Medications:  .  albuterol (PROVENTIL HFA;VENTOLIN HFA) 108 (90 BASE) MCG/ACT inhaler, Inhale 2 puffs into the lungs every 6 (six) hours as needed for wheezing or shortness of breath. Please instruct in usage., Disp: 1 Inhaler, Rfl: 0 .  alendronate (FOSAMAX) 70 MG tablet, Take 1 tablet by mouth Once a week. On Sunday, Disp: , Rfl:  .  aspirin EC 81 MG tablet, Take 1 tablet (81 mg total) by mouth daily. Restart 4/21, Disp: , Rfl:  .  folic acid (FOLVITE) 1 MG  tablet, Take 1 mg by mouth daily., Disp: , Rfl:  .  glimepiride (AMARYL) 4 MG tablet, , Disp: , Rfl:  .  glipiZIDE-metformin (METAGLIP) 5-500 MG tablet, , Disp: , Rfl:  .  lisinopril (PRINIVIL,ZESTRIL) 2.5 MG tablet, , Disp: , Rfl: 3 .  methotrexate 2.5 MG tablet, Take 7.5 mg by mouth 2 (two) times a week., Disp: , Rfl:  .  metoprolol tartrate (LOPRESSOR) 25 MG tablet, Take 25 mg by mouth daily. Reported on 07/28/2015, Disp: , Rfl:  .  Multiple Vitamins-Minerals (PRESERVISION AREDS 2) CAPS, Take 2 capsules by mouth 2 (two) times daily., Disp: , Rfl:  .  naproxen sodium (ALEVE) 220 MG tablet, Take by mouth., Disp: , Rfl:  .  polycarbophil (FIBERCON) 625 MG tablet, Take 2 tablets (1,250 mg total) by mouth daily with supper., Disp: , Rfl:  .  tetrahydrozoline-zinc (VISINE-AC) 0.05-0.25 % ophthalmic solution, Place 2 drops into both eyes daily as needed (dry eyes)., Disp: , Rfl:  No current facility-administered medications for this visit.   Facility-Administered Medications Ordered in Other Visits:  .  0.9 %  sodium chloride infusion, , Intravenous, Continuous, Hurley Cisco, MD, Last Rate: 20 mL/hr at 10/04/10 1339  Allergies Bupropion and Lopid  [gemfibrozil]  Review of Systems Review of Systems - Oncology ROS as per HPI otherwise 12 point ROS is negative.   Physical Exam  Vitals Wt Readings from Last 3 Encounters:  06/21/17 140 lb (63.5 kg)  06/09/17 146 lb (66.2 kg)  02/17/16 134 lb 9.6 oz (61.1 kg)   Temp Readings from Last 3 Encounters:  06/21/17 97.7 F (36.5 C) (Oral)  06/09/17 97.9 F (36.6 C) (Oral)  06/09/17 98.4 F (36.9 C) (Oral)   BP Readings from Last 3 Encounters:  06/21/17 (!) 149/112  06/09/17 (!) 146/44  06/09/17 (!) 155/53   Pulse Readings from Last 3 Encounters:  06/21/17 98  06/09/17 98  06/09/17 93   Constitutional: Well-developed, well-nourished, and in no distress.  Seated in wheel chair.   HENT: Head: Normocephalic and atraumatic.   Mouth/Throat: No oropharyngeal exudate. Mucosa moist. Eyes: Pupils are equal, round, and reactive to light. Conjunctivae are normal. No scleral  icterus.  Neck: Normal range of motion. Neck supple. No JVD present.  Cardiovascular: Normal rate, regular rhythm and normal heart sounds.  Exam reveals no gallop and no friction rub.   No murmur heard. Pulmonary/Chest: Effort normal and breath sounds normal. No respiratory distress. No wheezes.No rales.  Abdominal: Soft. Bowel sounds are normal. No distension. There is no tenderness. There is no guarding.  Musculoskeletal: No edema or tenderness.  Lymphadenopathy: No cervical, axillary or supraclavicular adenopathy.  Neurological: Alert and oriented to person, place, and time. No cranial nerve deficit.  Skin: Skin is warm and dry. No rash noted. No erythema. No pallor.  Psychiatric: Affect and judgment normal.   Labs Appointment on 06/20/2017  Component Date Value Ref Range Status  . WBC 06/20/2017 5.5  4.0 - 10.5 K/uL Final  . RBC 06/20/2017 3.21* 3.87 - 5.11 MIL/uL Final  . Hemoglobin 06/20/2017 9.8* 12.0 - 15.0 g/dL Final  . HCT 06/20/2017 32.2* 36.0 - 46.0 % Final  . MCV 06/20/2017 100.3* 78.0 - 100.0 fL Final  . MCH 06/20/2017 30.5  26.0 - 34.0 pg Final  . MCHC 06/20/2017 30.4  30.0 - 36.0 g/dL Final  . RDW 06/20/2017 18.5* 11.5 - 15.5 % Final  . Platelets 06/20/2017 41* 150 - 400 K/uL Final   Comment: PLATELET COUNT CONFIRMED BY SMEAR SPECIMEN CHECKED FOR CLOTS   . Neutrophils Relative % 06/20/2017 61  % Final  . Neutro Abs 06/20/2017 3.3  1.7 - 7.7 K/uL Final  . Lymphocytes Relative 06/20/2017 24  % Final  . Lymphs Abs 06/20/2017 1.3  0.7 - 4.0 K/uL Final  . Monocytes Relative 06/20/2017 13  % Final  . Monocytes Absolute 06/20/2017 0.7  0.1 - 1.0 K/uL Final  . Eosinophils Relative 06/20/2017 2  % Final  . Eosinophils Absolute 06/20/2017 0.1  0.0 - 0.7 K/uL Final  . Basophils Relative 06/20/2017 0  % Final  . Basophils Absolute  06/20/2017 0.0  0.0 - 0.1 K/uL Final   Performed at [facility], [address], [place], [place] [address]  . Sodium 06/20/2017 132* 135 - 145 mmol/L Final  . Potassium 06/20/2017 4.6  3.5 - 5.1 mmol/L Final  . Chloride 06/20/2017 100* 101 - 111 mmol/L Final  . CO2 06/20/2017 24  22 - 32 mmol/L Final  . Glucose, Bld 06/20/2017 131* 65 - 99 mg/dL Final  . BUN 06/20/2017 23* 6 - 20 mg/dL Final  . Creatinine, Ser 06/20/2017 1.01* 0.44 - 1.00 mg/dL Final  . Calcium 06/20/2017 9.0  8.9 - 10.3 mg/dL Final  . Total Protein 06/20/2017 7.7  6.5 - 8.1 g/dL Final  . Albumin 06/20/2017 3.8  3.5 - 5.0 g/dL Final  . AST 06/20/2017 19  15 - 41 U/L Final  . ALT 06/20/2017 12* 14 - 54 U/L Final  . Alkaline Phosphatase 06/20/2017 93  38 - 126 U/L Final  . Total Bilirubin 06/20/2017 0.6  0.3 - 1.2 mg/dL Final  . GFR calc non Af Amer 06/20/2017 53* >60 mL/min Final  . GFR calc Af Amer 06/20/2017 >60  >60 mL/min Final   Comment: (NOTE) The eGFR has been calculated using the CKD EPI equation. This calculation has not been validated in all clinical situations. eGFR's persistently <60 mL/min signify possible Chronic Kidney Disease.   Georgiann Hahn gap 06/20/2017 8  5 - 15 Final   Performed at [facility], [address] [place], [place] [address]  . LDH 06/20/2017 162  98 - 192 U/L Final  Performed at [facility], [address], [place], [place] [address]     Pathology No orders of the defined types were placed in this encounter.      Zoila Shutter MD

## 2017-06-23 ENCOUNTER — Telehealth (HOSPITAL_COMMUNITY): Payer: Self-pay

## 2017-06-23 NOTE — Telephone Encounter (Signed)
Per Dr. Walden Field request, I notified Tammy at Dr. Nolon Rod office (patients PCP), that Dr. Walden Field told patient to hold her methotrexate until her platelets recover. Tammy states Dr. Gerarda Fraction is out until Monday. She wil tell him then.

## 2017-06-26 ENCOUNTER — Encounter (HOSPITAL_COMMUNITY)
Admission: RE | Admit: 2017-06-26 | Discharge: 2017-06-26 | Disposition: A | Discharge status: Home / Self Care | Payer: Medicare Other | Source: Ambulatory Visit | Attending: Internal Medicine | Admitting: Internal Medicine

## 2017-06-26 DIAGNOSIS — R911 Solitary pulmonary nodule: Secondary | ICD-10-CM | POA: Insufficient documentation

## 2017-06-26 DIAGNOSIS — C3411 Malignant neoplasm of upper lobe, right bronchus or lung: Secondary | ICD-10-CM | POA: Diagnosis not present

## 2017-06-26 DIAGNOSIS — C3412 Malignant neoplasm of upper lobe, left bronchus or lung: Secondary | ICD-10-CM | POA: Diagnosis not present

## 2017-06-26 MED ORDER — FLUDEOXYGLUCOSE F - 18 (FDG) INJECTION
10.0000 | Freq: Once | INTRAVENOUS | Status: AC | PRN
  Administered 2017-06-26: 11.86 via INTRAVENOUS

## 2017-06-28 ENCOUNTER — Encounter (HOSPITAL_COMMUNITY): Payer: Self-pay | Admitting: Internal Medicine

## 2017-06-28 ENCOUNTER — Inpatient Hospital Stay (HOSPITAL_BASED_OUTPATIENT_CLINIC_OR_DEPARTMENT_OTHER): Payer: Medicare Other | Admitting: Internal Medicine

## 2017-06-28 ENCOUNTER — Inpatient Hospital Stay (HOSPITAL_COMMUNITY): Payer: Medicare Other

## 2017-06-28 ENCOUNTER — Other Ambulatory Visit (HOSPITAL_COMMUNITY): Payer: Medicare Other

## 2017-06-28 VITALS — BP 161/55 | Temp 98.0°F | Resp 20

## 2017-06-28 VITALS — BP 149/51 | HR 108 | Temp 97.8°F | Resp 20 | Wt 140.0 lb

## 2017-06-28 DIAGNOSIS — D500 Iron deficiency anemia secondary to blood loss (chronic): Secondary | ICD-10-CM

## 2017-06-28 DIAGNOSIS — Z85118 Personal history of other malignant neoplasm of bronchus and lung: Secondary | ICD-10-CM | POA: Diagnosis not present

## 2017-06-28 DIAGNOSIS — E119 Type 2 diabetes mellitus without complications: Secondary | ICD-10-CM | POA: Diagnosis not present

## 2017-06-28 DIAGNOSIS — Z79899 Other long term (current) drug therapy: Secondary | ICD-10-CM | POA: Diagnosis not present

## 2017-06-28 DIAGNOSIS — D696 Thrombocytopenia, unspecified: Secondary | ICD-10-CM

## 2017-06-28 DIAGNOSIS — Z7984 Long term (current) use of oral hypoglycemic drugs: Secondary | ICD-10-CM

## 2017-06-28 DIAGNOSIS — Z923 Personal history of irradiation: Secondary | ICD-10-CM | POA: Diagnosis not present

## 2017-06-28 DIAGNOSIS — F17210 Nicotine dependence, cigarettes, uncomplicated: Secondary | ICD-10-CM | POA: Diagnosis not present

## 2017-06-28 DIAGNOSIS — Z7982 Long term (current) use of aspirin: Secondary | ICD-10-CM | POA: Diagnosis not present

## 2017-06-28 MED ORDER — SODIUM CHLORIDE 0.9 % IV SOLN
INTRAVENOUS | Status: DC
  Administered 2017-06-28: 14:00:00 via INTRAVENOUS

## 2017-06-28 MED ORDER — SODIUM CHLORIDE 0.9% FLUSH
10.0000 mL | Freq: Once | INTRAVENOUS | Status: AC
  Administered 2017-06-28: 10 mL via INTRAVENOUS

## 2017-06-28 MED ORDER — FERRIC CARBOXYMALTOSE 750 MG/15ML IV SOLN
750.0000 mg | Freq: Once | INTRAVENOUS | Status: AC
  Administered 2017-06-28: 750 mg via INTRAVENOUS
  Filled 2017-06-28: qty 15

## 2017-06-28 NOTE — Progress Notes (Signed)
Patient to treatment area for iron infusion.  Reviewed medication with side effects and when to notify the nurse.  Verbalized understanding with no questions asked.    Patient tolerated iron infusion with no complaints voiced.  Good blood return noted before and after administration of iron.  No complaints of pain at site with no bruising or swelling noted.  Band aid applied.  VSS with discharge and left by wheelchair.  No s/s of distress noted.

## 2017-06-28 NOTE — Progress Notes (Signed)
Diagnosis Thrombocytopenia (HCC) - Plan: CBC with Differential/Platelet, Comprehensive metabolic panel, Lactate dehydrogenase, Ferritin  Iron deficiency anemia due to chronic blood loss - Plan: CBC with Differential/Platelet, Comprehensive metabolic panel, Lactate dehydrogenase, Ferritin  Staging Cancer Staging No matching staging information was found for the patient.  Assessment and Plan:  1.  Thrombocytopenia.  77 yr old female referred for consultation due to anemia and thrombocytopenia.  She was previously seen by PA Kefalas 05/05/2015 for thrombocytopenia while hospitalized.  She has rheumatoid arthritis and was being treated with methotrexate.  Pt is also on baby ASA.   She has undergone SBRT by Dr. Lisbeth Renshaw to a right and left pulmonary nodule suspicious for primary bronchogenic carcinoma with hypermetabolic activity on PET imaging.  She received 54 Gy in 3 fractions to the right lung nodule and 50 Gy in 5 fractions to the left lung nodule.  She was followed by Dr. Audree Camel (medical oncology) at [facility].  Unclear when she was last seen by Dr. Jacquiline Doe.    She underwent colonoscopy by Dr. Oneida Alar 05/05/2015 with no obvious source for bleeding.    Pt has thrombocytopenia dating back to at least 2017.  She had a CT chest done 03/27/2017 that showed  IMPRESSION: 1. Stable bilateral upper lobe nodules with surrounding post treatment changes. 2.  Aortic atherosclerosis (ICD10-170.0). 3.  Emphysema (ICD10-J43.9).  Bilateral diagnostic mammogram done 03/01/2016 showed  IMPRESSION: 1. No mammographic evidence for primary breast malignancy. 2. Multiple enlarged axillary lymph nodes bilaterally, representing a change compared with earlier CT exams. 3. Considerations include lymphoproliferative disease, atypical metastatic disease from lung cancer, occult breast malignancy, or reactive lymph nodes. Psoriasis would be an unusual cause for lymphadenopathy without  associated superinfection.  Pt underwent USN core biopsy on 03/22/2016 that showed  Diagnosis Lymph node, needle/core biopsy, right axilla - BENIGN REACTIVE LYMPH NODE, SEE COMMENT. - NO EVIDENCE OF MALIGNANCY.   Outside labs done 05/30/2017 showed WBC 4.9, HB 8.7 and plts 43,000.  She has a normal differential Labs done 05/24/2017 showed WBC 5, HB 8.2 and plts 33,000 with normal differential.  HCV negative.  Ferritin 76.    Pt denies any fevers, chills, night sweats and has noted no adenopathy.  She reports occasional bruising.  She denies any bleeding.  Pt was transfused with I u pheresis plts on 06/09/2017 due to thrombocytopenia.    Labs done 06/20/2017 previously reviewed with pt and show WBC 5.5 HB 9.8 and plts 41,000.  LDH 162 which is improved from labs done 06/15/2017 which showed WBC 4.1, HB 8.8, plts 27,000. Smear review shows normal differential with no fragmentation.  PT 13.9, PTT 28.  Cr 0.86.  LDH normal at 165.  Formal pathology review of peripheral smear showed macrocytic anemia and thrombocytopenia.    In light of plts being 27,000, she was transfused with 1 u pheresis plts on 06/09/2017.  Plt count has improved and is 41,000 on recent labs done 06/20/2017.    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  She was instructed to continue to hold MTX.  Pt will have repeat labs 07/10/2017 and will follow-up to go over results.    If counts worsen or fail to improve, will consider BM biopsy.    2.  Adenopathy. PET scan scheduled for 06/26/2017 due to lung cancer history and adenopathy that was noted on Bilateral diagnostic mammogram done 03/01/2016 that showed  IMPRESSION: 1.  No mammographic evidence for primary breast malignancy. 2. Multiple enlarged axillary lymph nodes bilaterally, representing a change compared with earlier CT exams. 3. Considerations include lymphoproliferative disease, atypical metastatic disease  from lung cancer, occult breast malignancy, or reactive lymph nodes. Psoriasis would be an unusual cause for lymphadenopathy without associated superinfection.  RECOMMENDATION: Consider ultrasound-guided core biopsy of one of the larger lymph nodes in the right axilla.  Pt underwent USN core biopsy on 03/22/2016 that showed  Diagnosis Lymph node, needle/core biopsy, right axilla - BENIGN REACTIVE LYMPH NODE, SEE COMMENT. - NO EVIDENCE OF MALIGNANCY.  Pt had Pet scan done 06/26/2017 that showed  IMPRESSION: 1. No evidence of hypermetabolic recurrent tumor at the sites of the treated pulmonary nodules in the upper lobes bilaterally. 2. No evidence of hypermetabolic metastatic disease. Top-normal size axillary lymph nodes are non hypermetabolic bilaterally. 3. Chronic findings include: Aortic Atherosclerosis (ICD10-I70.0). Stable ectatic 2.5 cm infrarenal abdominal aorta. Three-vessel coronary atherosclerosis. Mild sigmoid diverticulosis.  Will repeat lung imaging in 3-6 months.    3.  Lung cancer.  She has undergone SBRT by Dr. Lisbeth Renshaw to a right and left pulmonary nodule suspicious for primary bronchogenic carcinoma with hypermetabolic activity on PET imaging.  She received 54 Gy in 3 fractions to the right lung nodule and 50 Gy in 5 fractions to the left lung nodule.  She was followed by Dr. Audree Camel (medical oncology) at [facility].  Unclear when she was last seen by Dr. Jacquiline Doe.    She had a CT chest done 03/27/2017 that showed  IMPRESSION: 1. Stable bilateral upper lobe nodules with surrounding post treatment changes. 2.  Aortic atherosclerosis (ICD10-170.0). 3.  Emphysema (ICD10-J43.9).  PET scan was scheduled for 06/26/2017 due to lung cancer history and adenopathy that was noted on mammogram.  She had Pet scan done 06/26/2017 that showed  IMPRESSION: 1. No evidence of hypermetabolic recurrent tumor at the sites of the treated pulmonary nodules in the  upper lobes bilaterally. 2. No evidence of hypermetabolic metastatic disease. Top-normal size axillary lymph nodes are non hypermetabolic bilaterally. 3. Chronic findings include: Aortic Atherosclerosis (ICD10-I70.0). Stable ectatic 2.5 cm infrarenal abdominal aorta. Three-vessel coronary atherosclerosis. Mild sigmoid diverticulosis.  She will have repeat imaging in 3-6 months.    4.  RA.  RF was elevated at 60 , CRP normal,  Sed rate 55.  Pt on MTX and  Pt instructed to hold MTX until further notice due to thrombocytopenia.  Our office has contacted Dr. Gerarda Fraction concerning this.    5.  Smoking.  Pt continues to smoke.  Cessation recommended especially in light of lung cancer history.  She has undergone PET scan on 06/26/2017 that shows no evidence of recurrent tumor.    6.  IDA.   Labs done 06/20/2017 show HB 9.8.  Ferritin was decreased at 35.  Pt will be treated with IV iron with Injectafer 750 mg IV D1 and D8 and is here for D1.  SPEP negative.   Previous labs done in 2017  SPEP negative, B12 and folate normal.  Pt also likely has anemia of chronic disease due to RA and medication effects.  She also has history of GI bleeding.  She will have repeat labs on  07/10/2017 after IV iron completed.    7.  HTN.  BP is 149/51. Follow-up with PCP.   8. DM.  Follow-up with PCP.    9.  Health maintenance.  Pt has undergone colonoscopy in 2017.  Follow-up with GI  as recommended.    Interval history:  77 yr old female referred for consultation due to anemia and thrombocytopenia.  She was previously seen by PA Kefalas 05/05/2015 for thrombocytopenia while hospitalized.  She has rheumatoid arthritis and is being treated with methotrexate.  Pt is also on baby ASA.   She has undergone SBRT by Dr. Lisbeth Renshaw to a right and left pulmonary nodule suspicious for primary bronchogenic carcinoma with hypermetabolic activity on PET imaging.  She received 54 Gy in 3 fractions to the right lung nodule and 50 Gy in 5 fractions to  the left lung nodule.  She was followed by Dr. Audree Camel (medical oncology) at [facility].  Unclear when she was last seen by Dr. Jacquiline Doe.    She underwent colonoscopy by Dr. Oneida Alar 05/05/2015 with no obvious source for bleeding.    Pt has thrombocytopenia dating back to at least 2017.  She had a CT chest done 03/27/2017 that showed  IMPRESSION: 1. Stable bilateral upper lobe nodules with surrounding post treatment changes. 2.  Aortic atherosclerosis (ICD10-170.0). 3.  Emphysema (ICD10-J43.9).  Bilateral diagnostic mammogram done 03/01/2016 showed  IMPRESSION: 1. No mammographic evidence for primary breast malignancy. 2. Multiple enlarged axillary lymph nodes bilaterally, representing a change compared with earlier CT exams. 3. Considerations include lymphoproliferative disease, atypical metastatic disease from lung cancer, occult breast malignancy, or reactive lymph nodes. Psoriasis would be an unusual cause for lymphadenopathy without associated superinfection.  RECOMMENDATION: Consider ultrasound-guided core biopsy of one of the larger lymph nodes in the right axilla.  Pt underwent USN core biopsy on 03/22/2016 that showed  Diagnosis Lymph node, needle/core biopsy, right axilla - BENIGN REACTIVE LYMPH NODE, SEE COMMENT. - NO EVIDENCE OF MALIGNANCY.    Outside labs done 05/30/2017 showed WBC 4.9, HB 8.7 and plts 43,000.  She has a normal differential Labs done 05/24/2017 showed WBC 5, HB 8.2 and plts 33,000 with normal differential.  HCV negative.  Ferritin 76.    Due to thrombocytopenia she as transfused with 1 u pheresis plts on 06/09/2017.  Current status:  Pt is seen today for follow-up.  She is here to go over Pet scan and for evaluation prior to IV iron.    Problem List Patient Active Problem List   Diagnosis Date Noted  . Iron deficiency anemia due to chronic blood loss [D50.0] 06/21/2017  . Primary malignant neoplasm of left upper lobe of lung  ([place]) [C34.12] 08/11/2015  . Candida esophagitis ([place]) [B37.81] 07/01/2015  . Rectal bleeding [K62.5] 04/30/2015  . Colitis, acute [K52.9] 04/30/2015  . Viral gastroenteritis [A08.4] 04/01/2015  . Fracture of 5th metatarsal [S92.353A]   . Fracture of 4th metatarsal [S92.343A]   . Fracture of 3rd metatarsal [S92.333A]   . Fracture of 2nd metatarsal [S92.323A]   . Sepsis ([place]) [A41.9] 03/31/2015  . AKI (acute kidney injury) ([place]) [N17.9] 03/31/2015  . Leukocytosis [D72.829]   . Malignant neoplasm of right main bronchus ([place]) [C34.01] 02/12/2015  . Thrombocytopenia, unspecified ([place]) [D69.6] 09/04/2013  . Normocytic anemia [D64.9] 09/04/2013  . Diabetes mellitus, type 2 ([place]) [E11.9] 09/03/2013  . Essential hypertension [I10] 09/03/2013  . Tobacco dependence [F17.200] 09/03/2013  . Shoulder pain [M25.519] 03/16/2011  . Stiffness of joint, not elsewhere classified,  shoulder region [M25.619] 03/16/2011  . Muscle weakness (generalized) [M62.81] 03/16/2011  . Rheumatoid arthritis ([place]) [M06.9] 08/16/2010    Past Medical History Past Medical History:  Diagnosis Date  . Diabetes mellitus    x 20 yrs  . Fibromyalgia   .  Fibromyalgia   . Lung cancer ([place]) 01/28/2015   left upper lobe lung /right upper lobe lung  . Lupus ([place])    sle  . Peripheral neuropathy   . RA (rheumatoid arthritis) ([place])    ra  . Thrombocytopenia, unspecified ([place]) 09/04/2013  . Tobacco use     Past Surgical History Past Surgical History:  Procedure Laterality Date  . ABDOMINAL HYSTERECTOMY    . APPENDECTOMY    . CATARACT EXTRACTION W/ INTRAOCULAR LENS IMPLANT  2010   right eye  . CESAREAN SECTION  x3  . COLONOSCOPY N/A 05/05/2015   Procedure: COLONOSCOPY;  Surgeon: Danie Binder, MD;  Location: [facility];  Service: Endoscopy;  Laterality: N/A;  . KNEE ARTHROSCOPY  yrs ago   left knee  . left arm surgery     multiple surgeries on  . OOPHORECTOMY    . SHOULDER HEMI-ARTHROPLASTY  01/27/2011    Procedure: SHOULDER HEMI-ARTHROPLASTY;  Surgeon: Nita Sells, MD;  Location: [facility] ORS;  Service: Orthopedics;  Laterality: Right;  interscaline right shoulder  . TRIGGER FINGER RELEASE  yrs ago   right hand    Family History Family History  Family history unknown: Yes     Social History  reports that she has been smoking cigarettes.  She has a 60.00 pack-year smoking history. She has never used smokeless tobacco. She reports that she does not drink alcohol or use drugs.  Medications  Current Outpatient Medications:  .  albuterol (PROVENTIL HFA;VENTOLIN HFA) 108 (90 BASE) MCG/ACT inhaler, Inhale 2 puffs into the lungs every 6 (six) hours as needed for wheezing or shortness of breath. Please instruct in usage., Disp: 1 Inhaler, Rfl: 0 .  aspirin EC 81 MG tablet, Take 1 tablet (81 mg total) by mouth daily. Restart 4/21, Disp: , Rfl:  .  folic acid (FOLVITE) 1 MG tablet, Take 1 mg by mouth daily., Disp: , Rfl:  .  glimepiride (AMARYL) 4 MG tablet, , Disp: , Rfl:  .  glipiZIDE-metformin (METAGLIP) 5-500 MG tablet, , Disp: , Rfl:  .  lisinopril (PRINIVIL,ZESTRIL) 2.5 MG tablet, , Disp: , Rfl: 3 .  Multiple Vitamins-Minerals (PRESERVISION AREDS 2) CAPS, Take 2 capsules by mouth 2 (two) times daily., Disp: , Rfl:  .  naproxen sodium (ALEVE) 220 MG tablet, Take by mouth., Disp: , Rfl:  .  polycarbophil (FIBERCON) 625 MG tablet, Take 2 tablets (1,250 mg total) by mouth daily with supper., Disp: , Rfl:  .  tetrahydrozoline-zinc (VISINE-AC) 0.05-0.25 % ophthalmic solution, Place 2 drops into both eyes daily as needed (dry eyes)., Disp: , Rfl:  .  alendronate (FOSAMAX) 70 MG tablet, Take 1 tablet by mouth Once a week. On Sunday, Disp: , Rfl:  .  methotrexate 2.5 MG tablet, Take 7.5 mg by mouth 2 (two) times a week., Disp: , Rfl:  .  metoprolol tartrate (LOPRESSOR) 25 MG tablet, Take 25 mg by mouth daily. Reported on 07/28/2015, Disp: , Rfl:  No current facility-administered medications for  this visit.   Facility-Administered Medications Ordered in Other Visits:  .  0.9 %  sodium chloride infusion, , Intravenous, Continuous, Hurley Cisco, MD, Last Rate: 20 mL/hr at 10/04/10 1339 .  0.9 %  sodium chloride infusion, , Intravenous, Continuous, Jahaziel Francois, Mathis Dad, MD, Stopped at 06/28/17 1453  Allergies Bupropion and Lopid  [gemfibrozil]  Review of Systems Review of Systems - Oncology ROS as per HPI otherwise 12 point ROS is negative.   Physical Exam  Vitals Wt Readings from  Last 3 Encounters:  06/28/17 140 lb (63.5 kg)  06/21/17 140 lb (63.5 kg)  06/09/17 146 lb (66.2 kg)   Temp Readings from Last 3 Encounters:  06/28/17 98 F (36.7 C) (Oral)  06/28/17 97.8 F (36.6 C) (Oral)  06/21/17 97.7 F (36.5 C) (Oral)   BP Readings from Last 3 Encounters:  06/28/17 (!) 161/55  06/28/17 (!) 149/51  06/21/17 (!) 149/112   Pulse Readings from Last 3 Encounters:  06/28/17 (!) 108  06/21/17 98  06/09/17 98   Constitutional: Well-developed, well-nourished, and in no distress.   HENT: Head: Normocephalic and atraumatic.  Mouth/Throat: No oropharyngeal exudate. Mucosa moist. Eyes: Pupils are equal, round, and reactive to light. Conjunctivae are normal. No scleral icterus.  Neck: Normal range of motion. Neck supple. No JVD present.  Cardiovascular: Normal rate, regular rhythm and normal heart sounds.  Exam reveals no gallop and no friction rub.   No murmur heard. Pulmonary/Chest: Effort normal and breath sounds normal. No respiratory distress. No wheezes.No rales.  Abdominal: Soft. Bowel sounds are normal. No distension. There is no tenderness. There is no guarding.  Musculoskeletal: No edema or tenderness.  Lymphadenopathy: No cervical, axillary or supraclavicular adenopathy.  Neurological: Alert and oriented to person, place, and time. No cranial nerve deficit.  Skin: Skin is warm and dry. No rash noted. No erythema. No pallor.  Psychiatric: Affect and judgment normal.    Labs No visits with results within 3 Day(s) from this visit.  Latest known visit with results is:  Appointment on 06/20/2017  Component Date Value Ref Range Status  . WBC 06/20/2017 5.5  4.0 - 10.5 K/uL Final  . RBC 06/20/2017 3.21* 3.87 - 5.11 MIL/uL Final  . Hemoglobin 06/20/2017 9.8* 12.0 - 15.0 g/dL Final  . HCT 06/20/2017 32.2* 36.0 - 46.0 % Final  . MCV 06/20/2017 100.3* 78.0 - 100.0 fL Final  . MCH 06/20/2017 30.5  26.0 - 34.0 pg Final  . MCHC 06/20/2017 30.4  30.0 - 36.0 g/dL Final  . RDW 06/20/2017 18.5* 11.5 - 15.5 % Final  . Platelets 06/20/2017 41* 150 - 400 K/uL Final   Comment: PLATELET COUNT CONFIRMED BY SMEAR SPECIMEN CHECKED FOR CLOTS   . Neutrophils Relative % 06/20/2017 61  % Final  . Neutro Abs 06/20/2017 3.3  1.7 - 7.7 K/uL Final  . Lymphocytes Relative 06/20/2017 24  % Final  . Lymphs Abs 06/20/2017 1.3  0.7 - 4.0 K/uL Final  . Monocytes Relative 06/20/2017 13  % Final  . Monocytes Absolute 06/20/2017 0.7  0.1 - 1.0 K/uL Final  . Eosinophils Relative 06/20/2017 2  % Final  . Eosinophils Absolute 06/20/2017 0.1  0.0 - 0.7 K/uL Final  . Basophils Relative 06/20/2017 0  % Final  . Basophils Absolute 06/20/2017 0.0  0.0 - 0.1 K/uL Final   Performed at [facility], [address] [place], [place] [address]  . Sodium 06/20/2017 132* 135 - 145 mmol/L Final  . Potassium 06/20/2017 4.6  3.5 - 5.1 mmol/L Final  . Chloride 06/20/2017 100* 101 - 111 mmol/L Final  . CO2 06/20/2017 24  22 - 32 mmol/L Final  . Glucose, Bld 06/20/2017 131* 65 - 99 mg/dL Final  . BUN 06/20/2017 23* 6 - 20 mg/dL Final  . Creatinine, Ser 06/20/2017 1.01* 0.44 - 1.00 mg/dL Final  . Calcium 06/20/2017 9.0  8.9 - 10.3 mg/dL Final  . Total Protein 06/20/2017 7.7  6.5 - 8.1 g/dL Final  . Albumin 06/20/2017 3.8  3.5 -  5.0 g/dL Final  . AST 06/20/2017 19  15 - 41 U/L Final  . ALT 06/20/2017 12* 14 - 54 U/L Final  . Alkaline Phosphatase 06/20/2017 93  38 - 126 U/L Final  . Total Bilirubin  06/20/2017 0.6  0.3 - 1.2 mg/dL Final  . GFR calc non Af Amer 06/20/2017 53* >60 mL/min Final  . GFR calc Af Amer 06/20/2017 >60  >60 mL/min Final   Comment: (NOTE) The eGFR has been calculated using the CKD EPI equation. This calculation has not been validated in all clinical situations. eGFR's persistently <60 mL/min signify possible Chronic Kidney Disease.   Georgiann Hahn gap 06/20/2017 8  5 - 15 Final   Performed at [facility], [address] [place], [place] [address]  . LDH 06/20/2017 162  98 - 192 U/L Final   Performed at [facility], [address], [place], [place] [address]     Pathology Orders Placed This Encounter  Procedures  . CBC with Differential/Platelet    Standing Status:   Future    Standing Expiration Date:   06/29/2018  . Comprehensive metabolic panel    Standing Status:   Future    Standing Expiration Date:   06/29/2018  . Lactate dehydrogenase    Standing Status:   Future    Standing Expiration Date:   06/29/2018  . Ferritin    Standing Status:   Future    Standing Expiration Date:   06/29/2018       Zoila Shutter MD

## 2017-06-28 NOTE — Patient Instructions (Signed)
Ferumoxytol injection What is this medicine? FERUMOXYTOL is an iron complex. Iron is used to make healthy red blood cells, which carry oxygen and nutrients throughout the body. This medicine is used to treat iron deficiency anemia in people with chronic kidney disease. This medicine may be used for other purposes; ask your health care provider or pharmacist if you have questions. COMMON BRAND NAME(S): Feraheme What should I tell my health care provider before I take this medicine? They need to know if you have any of these conditions: -anemia not caused by low iron levels -high levels of iron in the blood -magnetic resonance imaging (MRI) test scheduled -an unusual or allergic reaction to iron, other medicines, foods, dyes, or preservatives -pregnant or trying to get pregnant -breast-feeding How should I use this medicine? This medicine is for injection into a vein. It is given by a health care professional in a hospital or clinic setting. Talk to your pediatrician regarding the use of this medicine in children. Special care may be needed. Overdosage: If you think you have taken too much of this medicine contact a poison control center or emergency room at once. NOTE: This medicine is only for you. Do not share this medicine with others. What if I miss a dose? It is important not to miss your dose. Call your doctor or health care professional if you are unable to keep an appointment. What may interact with this medicine? This medicine may interact with the following medications: -other iron products This list may not describe all possible interactions. Give your health care provider a list of all the medicines, herbs, non-prescription drugs, or dietary supplements you use. Also tell them if you smoke, drink alcohol, or use illegal drugs. Some items may interact with your medicine. What should I watch for while using this medicine? Visit your doctor or healthcare professional regularly. Tell  your doctor or healthcare professional if your symptoms do not start to get better or if they get worse. You may need blood work done while you are taking this medicine. You may need to follow a special diet. Talk to your doctor. Foods that contain iron include: whole grains/cereals, dried fruits, beans, or peas, leafy green vegetables, and organ meats (liver, kidney). What side effects may I notice from receiving this medicine? Side effects that you should report to your doctor or health care professional as soon as possible: -allergic reactions like skin rash, itching or hives, swelling of the face, lips, or tongue -breathing problems -changes in blood pressure -feeling faint or lightheaded, falls -fever or chills -flushing, sweating, or hot feelings -swelling of the ankles or feet Side effects that usually do not require medical attention (report to your doctor or health care professional if they continue or are bothersome): -diarrhea -headache -nausea, vomiting -stomach pain This list may not describe all possible side effects. Call your doctor for medical advice about side effects. You may report side effects to FDA at 1-800-FDA-1088. Where should I keep my medicine? This drug is given in a hospital or clinic and will not be stored at home. NOTE: This sheet is a summary. It may not cover all possible information. If you have questions about this medicine, talk to your doctor, pharmacist, or health care provider.  2018 Elsevier/Gold Standard (2015-02-05 12:41:49)  [place] at [facility]  Discharge Instructions:  You received an iron infusion today.  _______________________________________________________________  Thank you for choosing [place] at [facility] to provide  your oncology and hematology care.  To afford each patient quality time with our providers, please arrive at least 15 minutes before your scheduled appointment.  You  need to re-schedule your appointment if you arrive 10 or more minutes late.  We strive to give you quality time with our providers, and arriving late affects you and other patients whose appointments are after yours.  Also, if you no show three or more times for appointments you may be dismissed from the clinic.  Again, thank you for choosing [place] at [facility] [place] hope is that these requests will allow you access to exceptional care and in a timely manner. _______________________________________________________________  If you have questions after your visit, please contact our office at [phone] between the hours of 8:30 a.m. and 5:00 p.m. Voicemails left after 4:30 p.m. will not be returned until the following business day. _______________________________________________________________  For prescription refill requests, have your pharmacy contact our office. _______________________________________________________________  Recommendations made by the consultant and any test results will be sent to your referring physician. _______________________________________________________________

## 2017-07-05 ENCOUNTER — Inpatient Hospital Stay (HOSPITAL_COMMUNITY): Payer: Medicare Other

## 2017-07-05 ENCOUNTER — Encounter (HOSPITAL_COMMUNITY): Payer: Self-pay

## 2017-07-05 VITALS — BP 158/55 | HR 84 | Temp 97.8°F | Resp 20

## 2017-07-05 DIAGNOSIS — D696 Thrombocytopenia, unspecified: Secondary | ICD-10-CM | POA: Diagnosis not present

## 2017-07-05 DIAGNOSIS — E119 Type 2 diabetes mellitus without complications: Secondary | ICD-10-CM | POA: Diagnosis not present

## 2017-07-05 DIAGNOSIS — Z7982 Long term (current) use of aspirin: Secondary | ICD-10-CM | POA: Diagnosis not present

## 2017-07-05 DIAGNOSIS — D500 Iron deficiency anemia secondary to blood loss (chronic): Secondary | ICD-10-CM

## 2017-07-05 DIAGNOSIS — F17210 Nicotine dependence, cigarettes, uncomplicated: Secondary | ICD-10-CM | POA: Diagnosis not present

## 2017-07-05 DIAGNOSIS — Z7984 Long term (current) use of oral hypoglycemic drugs: Secondary | ICD-10-CM | POA: Diagnosis not present

## 2017-07-05 MED ORDER — SODIUM CHLORIDE 0.9 % IV SOLN
INTRAVENOUS | Status: DC
  Administered 2017-07-05: 14:00:00 via INTRAVENOUS

## 2017-07-05 MED ORDER — SODIUM CHLORIDE 0.9 % IV SOLN
750.0000 mg | Freq: Once | INTRAVENOUS | Status: AC
  Administered 2017-07-05: 750 mg via INTRAVENOUS
  Filled 2017-07-05: qty 15

## 2017-07-05 NOTE — Progress Notes (Signed)
[name] tolerated Injectafer infusion well without complaints or incident. VSS upon discharge. Pt discharged via wheelchair in satisfactory condition accompanied by her son

## 2017-07-05 NOTE — Patient Instructions (Signed)
[facility] at [facility] Discharge Instructions  Received Injectafer infusion today. Follow-up as scheduled. Call clinic for any questions or concerns   Thank you for choosing [facility] at [facility] to provide your oncology and hematology care.  To afford each patient quality time with our provider, please arrive at least 15 minutes before your scheduled appointment time.   If you have a lab appointment with the Cancer Center please come in thru the  Main Entrance and check in at the main information desk  You need to re-schedule your appointment should you arrive 10 or more minutes late.  We strive to give you quality time with our providers, and arriving late affects you and other patients whose appointments are after yours.  Also, if you no show three or more times for appointments you may be dismissed from the clinic at the providers discretion.     Again, thank you for choosing [facility].  Our hope is that these requests will decrease the amount of time that you wait before being seen by our physicians.       _____________________________________________________________  Should you have questions after your visit to [facility], please contact our office at [phone] between the hours of 8:30 a.m. and 4:30 p.m.  Voicemails left after 4:30 p.m. will not be returned until the following business day.  For prescription refill requests, have your pharmacy contact our office.       Resources For Cancer Patients and their Caregivers ? American Cancer Society: Can assist with transportation, wigs, general needs, runs Look Good Feel Better.        1-888-227-6333 ? Cancer Care: Provides financial assistance, online support groups, medication/co-pay assistance.  1-800-813-HOPE (4673) ? [facility] Assists [place] cancer patients and their families through emotional , educational and  financial support.  336-427-4357 ? [place] DSS Where to apply for food stamps, Medicaid and utility assistance. 336-342-1394 ? [facility]: Transportation to medical appointments. 336-347-2287 ? Social Security Administration: May apply for disability if have a Stage IV cancer. 336-342-7796 1-800-772-1213 ? [place] Aging, Disability and Transit Services: Assists with nutrition, care and transit needs. 336-349-2343  Cancer Center Support Programs:   > Cancer Support Group  2nd Tuesday of the month 1pm-2pm, Journey Room   > Creative Journey  3rd Tuesday of the month 1130am-1pm, Journey Room    

## 2017-07-05 NOTE — Progress Notes (Signed)
Peripheral IV site with good blood return noted prior to and after Injectafer infusion and upon discharge

## 2017-07-11 ENCOUNTER — Inpatient Hospital Stay (HOSPITAL_COMMUNITY): Payer: Medicare Other

## 2017-07-11 ENCOUNTER — Encounter (HOSPITAL_COMMUNITY): Payer: Self-pay | Admitting: Internal Medicine

## 2017-07-11 ENCOUNTER — Inpatient Hospital Stay (HOSPITAL_BASED_OUTPATIENT_CLINIC_OR_DEPARTMENT_OTHER): Payer: Medicare Other | Admitting: Internal Medicine

## 2017-07-11 VITALS — BP 148/53 | HR 89 | Temp 97.3°F | Resp 18 | Wt 138.3 lb

## 2017-07-11 DIAGNOSIS — D500 Iron deficiency anemia secondary to blood loss (chronic): Secondary | ICD-10-CM | POA: Diagnosis not present

## 2017-07-11 DIAGNOSIS — Z85118 Personal history of other malignant neoplasm of bronchus and lung: Secondary | ICD-10-CM

## 2017-07-11 DIAGNOSIS — E119 Type 2 diabetes mellitus without complications: Secondary | ICD-10-CM | POA: Diagnosis not present

## 2017-07-11 DIAGNOSIS — Z923 Personal history of irradiation: Secondary | ICD-10-CM | POA: Diagnosis not present

## 2017-07-11 DIAGNOSIS — Z79899 Other long term (current) drug therapy: Secondary | ICD-10-CM

## 2017-07-11 DIAGNOSIS — F17210 Nicotine dependence, cigarettes, uncomplicated: Secondary | ICD-10-CM

## 2017-07-11 DIAGNOSIS — D696 Thrombocytopenia, unspecified: Secondary | ICD-10-CM

## 2017-07-11 DIAGNOSIS — Z7984 Long term (current) use of oral hypoglycemic drugs: Secondary | ICD-10-CM | POA: Diagnosis not present

## 2017-07-11 DIAGNOSIS — Z7982 Long term (current) use of aspirin: Secondary | ICD-10-CM

## 2017-07-11 LAB — CBC WITH DIFFERENTIAL/PLATELET
BASOS PCT: 0 %
Basophils Absolute: 0 10*3/uL (ref 0.0–0.1)
EOS ABS: 0.1 10*3/uL (ref 0.0–0.7)
EOS PCT: 1 %
HCT: 33.1 % — ABNORMAL LOW (ref 36.0–46.0)
Hemoglobin: 10 g/dL — ABNORMAL LOW (ref 12.0–15.0)
LYMPHS ABS: 1.2 10*3/uL (ref 0.7–4.0)
Lymphocytes Relative: 22 %
MCH: 30.3 pg (ref 26.0–34.0)
MCHC: 30.2 g/dL (ref 30.0–36.0)
MCV: 100.3 fL — ABNORMAL HIGH (ref 78.0–100.0)
MONOS PCT: 14 %
Monocytes Absolute: 0.8 10*3/uL (ref 0.1–1.0)
NEUTROS PCT: 63 %
Neutro Abs: 3.3 10*3/uL (ref 1.7–7.7)
PLATELETS: 25 10*3/uL — AB (ref 150–400)
RBC: 3.3 MIL/uL — AB (ref 3.87–5.11)
RDW: 17.1 % — ABNORMAL HIGH (ref 11.5–15.5)
WBC: 5.3 10*3/uL (ref 4.0–10.5)

## 2017-07-11 LAB — COMPREHENSIVE METABOLIC PANEL
ALBUMIN: 3.6 g/dL (ref 3.5–5.0)
ALT: 12 U/L (ref 0–44)
ANION GAP: 9 (ref 5–15)
AST: 17 U/L (ref 15–41)
Alkaline Phosphatase: 101 U/L (ref 38–126)
BUN: 18 mg/dL (ref 8–23)
CHLORIDE: 102 mmol/L (ref 98–111)
CO2: 27 mmol/L (ref 22–32)
Calcium: 9 mg/dL (ref 8.9–10.3)
Creatinine, Ser: 0.95 mg/dL (ref 0.44–1.00)
GFR calc non Af Amer: 57 mL/min — ABNORMAL LOW (ref >60)
GLUCOSE: 147 mg/dL — AB (ref 70–99)
Potassium: 4.1 mmol/L (ref 3.5–5.1)
SODIUM: 138 mmol/L (ref 135–145)
Total Bilirubin: 0.6 mg/dL (ref 0.3–1.2)
Total Protein: 7.8 g/dL (ref 6.5–8.1)

## 2017-07-11 LAB — LACTATE DEHYDROGENASE: LDH: 164 U/L (ref 98–192)

## 2017-07-11 LAB — FERRITIN: FERRITIN: 943 ng/mL — AB (ref 11–307)

## 2017-07-11 NOTE — Patient Instructions (Signed)
[facility] at [facility] Discharge Instructions  You saw Dr. Higgs today.   Thank you for choosing [facility] at [facility] to provide your oncology and hematology care.  To afford each patient quality time with our provider, please arrive at least 15 minutes before your scheduled appointment time.   If you have a lab appointment with the Cancer Center please come in thru the  Main Entrance and check in at the main information desk  You need to re-schedule your appointment should you arrive 10 or more minutes late.  We strive to give you quality time with our providers, and arriving late affects you and other patients whose appointments are after yours.  Also, if you no show three or more times for appointments you may be dismissed from the clinic at the providers discretion.     Again, thank you for choosing [facility].  Our hope is that these requests will decrease the amount of time that you wait before being seen by our physicians.       _____________________________________________________________  Should you have questions after your visit to  Cancer Center, please contact our office at [phone] between the hours of 8:30 a.m. and 4:30 p.m.  Voicemails left after 4:30 p.m. will not be returned until the following business day.  For prescription refill requests, have your pharmacy contact our office.       Resources For Cancer Patients and their Caregivers ? American Cancer Society: Can assist with transportation, wigs, general needs, runs Look Good Feel Better.        1-888-227-6333 ? Cancer Care: Provides financial assistance, online support groups, medication/co-pay assistance.  1-800-813-HOPE (4673) ? [facility] Assists [place] cancer patients and their families through emotional , educational and financial support.  336-427-4357 ? [place] DSS Where to apply for food  stamps, Medicaid and utility assistance. 336-342-1394 ? RC[facility]: Transportation to medical appointments. 336-347-2287 ? Social Security Administration: May apply for disability if have a Stage IV cancer. 336-342-7796 1-800-772-1213 ? [place] Aging, Disability and Transit Services: Assists with nutrition, care and transit needs. 336-349-2343  Cancer Center Support Programs:   > Cancer Support Group  2nd Tuesday of the month 1pm-2pm, Journey Room   > Creative Journey  3rd Tuesday of the month 1130am-1pm, Journey Room     

## 2017-07-13 ENCOUNTER — Inpatient Hospital Stay (HOSPITAL_COMMUNITY): Payer: Medicare Other

## 2017-07-13 ENCOUNTER — Encounter (HOSPITAL_COMMUNITY): Payer: Self-pay

## 2017-07-13 ENCOUNTER — Other Ambulatory Visit: Payer: Self-pay

## 2017-07-13 VITALS — BP 142/49 | HR 104 | Temp 98.0°F | Resp 18

## 2017-07-13 DIAGNOSIS — D500 Iron deficiency anemia secondary to blood loss (chronic): Secondary | ICD-10-CM | POA: Diagnosis not present

## 2017-07-13 DIAGNOSIS — D696 Thrombocytopenia, unspecified: Secondary | ICD-10-CM

## 2017-07-13 DIAGNOSIS — Z7984 Long term (current) use of oral hypoglycemic drugs: Secondary | ICD-10-CM | POA: Diagnosis not present

## 2017-07-13 DIAGNOSIS — F17210 Nicotine dependence, cigarettes, uncomplicated: Secondary | ICD-10-CM | POA: Diagnosis not present

## 2017-07-13 DIAGNOSIS — E119 Type 2 diabetes mellitus without complications: Secondary | ICD-10-CM | POA: Diagnosis not present

## 2017-07-13 DIAGNOSIS — Z7982 Long term (current) use of aspirin: Secondary | ICD-10-CM | POA: Diagnosis not present

## 2017-07-13 MED ORDER — ACETAMINOPHEN 325 MG PO TABS
650.0000 mg | ORAL_TABLET | Freq: Once | ORAL | Status: AC
  Administered 2017-07-13: 650 mg via ORAL

## 2017-07-13 MED ORDER — DIPHENHYDRAMINE HCL 25 MG PO CAPS
ORAL_CAPSULE | ORAL | Status: AC
  Filled 2017-07-13: qty 1

## 2017-07-13 MED ORDER — ACETAMINOPHEN 325 MG PO TABS
ORAL_TABLET | ORAL | Status: AC
  Filled 2017-07-13: qty 2

## 2017-07-13 MED ORDER — SODIUM CHLORIDE 0.9 % IV SOLN
250.0000 mL | Freq: Once | INTRAVENOUS | Status: AC
  Administered 2017-07-13: 250 mL via INTRAVENOUS

## 2017-07-13 MED ORDER — DIPHENHYDRAMINE HCL 25 MG PO CAPS
25.0000 mg | ORAL_CAPSULE | Freq: Once | ORAL | Status: AC
  Administered 2017-07-13: 25 mg via ORAL

## 2017-07-13 NOTE — Progress Notes (Signed)
Tolerated transfusion w/o adverse reaction.  Alert, in no distress.  VSS.  Discharged via wheelchair in c/o son.

## 2017-07-14 LAB — BPAM PLATELET PHERESIS
Blood Product Expiration Date: 201906272359
ISSUE DATE / TIME: 201906271139
Unit Type and Rh: 6200

## 2017-07-14 LAB — PREPARE PLATELET PHERESIS: UNIT DIVISION: 0

## 2017-07-14 NOTE — Progress Notes (Signed)
Diagnosis Thrombocytopenia ([place]) - Plan: CT BONE MARROW BIOPSY & ASPIRATION, CT Biopsy, CBC with Differential/Platelet, Comprehensive metabolic panel, Lactate dehydrogenase  Staging Cancer Staging No matching staging information was found for the patient.  Assessment and Plan:  1.  Thrombocytopenia.  Pt has thrombocytopenia dating back to at least 2017.  She had a CT chest done 03/27/2017 that showed  IMPRESSION: 1. Stable bilateral upper lobe nodules with surrounding post treatment changes. 2.  Aortic atherosclerosis (ICD10-170.0). 3.  Emphysema (ICD10-J43.9).  Bilateral diagnostic mammogram done 03/01/2016 showed  IMPRESSION: 1. No mammographic evidence for primary breast malignancy. 2. Multiple enlarged axillary lymph nodes bilaterally, representing a change compared with earlier CT exams. 3. Considerations include lymphoproliferative disease, atypical metastatic disease from lung cancer, occult breast malignancy, or reactive lymph nodes. Psoriasis would be an unusual cause for lymphadenopathy without associated superinfection.  Pt underwent USN core biopsy on 03/22/2016 that showed  Diagnosis Lymph node, needle/core biopsy, right axilla - BENIGN REACTIVE LYMPH NODE, SEE COMMENT. - NO EVIDENCE OF MALIGNANCY.   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  She was instructed to continue to hold MTX.    Pt is here to go over labs done 07/11/2017 and were reviewed with pt and showed WBC 5.3 HB 10 plts 25,000.  She will be set up for 1 unit pheresis plt transfusion this week.  She will also be set up for bone marrow biopsy due to persistent thrombocytopenia.  She will RTC to go over results.    2.  Adenopathy. Pt had PET scan done 06/26/2017 due to lung cancer history and adenopathy that was noted on Bilateral diagnostic mammogram done 03/01/2016 that showed  IMPRESSION: 1. No mammographic evidence for primary  breast malignancy. 2. Multiple enlarged axillary lymph nodes bilaterally, representing a change compared with earlier CT exams. 3. Considerations include lymphoproliferative disease, atypical metastatic disease from lung cancer, occult breast malignancy, or reactive lymph nodes. Psoriasis would be an unusual cause for lymphadenopathy without associated superinfection.  RECOMMENDATION: Consider ultrasound-guided core biopsy of one of the larger lymph nodes in the right axilla.  Pt underwent USN core biopsy on 03/22/2016 that showed  Diagnosis Lymph node, needle/core biopsy, right axilla - BENIGN REACTIVE LYMPH NODE, SEE COMMENT. - NO EVIDENCE OF MALIGNANCY.  Pt had Pet scan done 06/26/2017 that showed  IMPRESSION: 1. No evidence of hypermetabolic recurrent tumor at the sites of the treated pulmonary nodules in the upper lobes bilaterally. 2. No evidence of hypermetabolic metastatic disease. Top-normal size axillary lymph nodes are non hypermetabolic bilaterally. 3. Chronic findings include: Aortic Atherosclerosis (ICD10-I70.0). Stable ectatic 2.5 cm infrarenal abdominal aorta. Three-vessel coronary atherosclerosis. Mild sigmoid diverticulosis.  Will repeat lung imaging in 3-6 months.    3.  Lung cancer.  She has undergone SBRT by Dr. Lisbeth Renshaw to a right and left pulmonary nodule suspicious for primary bronchogenic carcinoma with hypermetabolic activity on PET imaging.  She received 54 Gy in 3 fractions to the right lung nodule and 50 Gy in 5 fractions to the left lung nodule.  She was followed by Dr. Audree Camel (medical oncology) at [facility]  Unclear when she was last seen by Dr. Jacquiline Doe.    She had a CT chest done 03/27/2017 that showed  IMPRESSION: 1. Stable bilateral upper lobe nodules with surrounding post treatment changes. 2.  Aortic atherosclerosis (ICD10-170.0). 3.  Emphysema (ICD10-J43.9).  PET scan was scheduled for 06/26/2017 due to  lung cancer  history and adenopathy that was noted on mammogram.  She had Pet scan done 06/26/2017 that showed  IMPRESSION: 1. No evidence of hypermetabolic recurrent tumor at the sites of the treated pulmonary nodules in the upper lobes bilaterally. 2. No evidence of hypermetabolic metastatic disease. Top-normal size axillary lymph nodes are non hypermetabolic bilaterally. 3. Chronic findings include: Aortic Atherosclerosis (ICD10-I70.0). Stable ectatic 2.5 cm infrarenal abdominal aorta. Three-vessel coronary atherosclerosis. Mild sigmoid diverticulosis.  She will have repeat imaging in 3-6 months.    4.  RA.  RF was elevated at 60 , CRP normal,  Sed rate 55.  Pt will continue to hold MTX due to persistent thrombocytopenia.    5.  Smoking.  Pt continues to smoke.  Cessation recommended especially in light of lung cancer history.  She has undergone PET scan on 06/26/2017 that shows no evidence of recurrent tumor.    6.  IDA.   Labs done 06/20/2017 show HB 9.8.  Ferritin was decreased at 35.  Pt will be treated with IV iron with Injectafer 750 mg IV D1 and D8.  Labs done 07/11/2017 show hb is improved at 10 and Ferritin is 943.  Workup showed SPEP negative.   Pt is set up for bone marrow biopsy due to persistent thrombocytopenia.    7.  HTN.  BP is 142/49.   Follow-up with PCP.   Interval history:  Historical data obtained from noted dated 06/28/2017:  77 yr old female seen for consultation due to anemia and thrombocytopenia.  She was previously seen by PA Kefalas 05/05/2015 for thrombocytopenia while hospitalized.  She has rheumatoid arthritis and is being treated with methotrexate.  Pt is also on baby ASA.   She has undergone SBRT by Dr. Lisbeth Renshaw to a right and left pulmonary nodule suspicious for primary bronchogenic carcinoma with hypermetabolic activity on PET imaging.  She received 54 Gy in 3 fractions to the right lung nodule and 50 Gy in 5 fractions to the left lung nodule.  She was followed by Dr. Audree Camel (medical oncology) at [facility].  Unclear when she was last seen by Dr. Jacquiline Doe.    She underwent colonoscopy by Dr. Oneida Alar 05/05/2015 with no obvious source for bleeding.    Pt has thrombocytopenia dating back to at least 2017.  She had a CT chest done 03/27/2017 that showed  IMPRESSION: 1. Stable bilateral upper lobe nodules with surrounding post treatment changes. 2.  Aortic atherosclerosis (ICD10-170.0). 3.  Emphysema (ICD10-J43.9).  Bilateral diagnostic mammogram done 03/01/2016 showed  IMPRESSION: 1. No mammographic evidence for primary breast malignancy. 2. Multiple enlarged axillary lymph nodes bilaterally, representing a change compared with earlier CT exams. 3. Considerations include lymphoproliferative disease, atypical metastatic disease from lung cancer, occult breast malignancy, or reactive lymph nodes. Psoriasis would be an unusual cause for lymphadenopathy without associated superinfection.  RECOMMENDATION: Consider ultrasound-guided core biopsy of one of the larger lymph nodes in the right axilla.  Pt underwent USN core biopsy on 03/22/2016 that showed  Diagnosis Lymph node, needle/core biopsy, right axilla - BENIGN REACTIVE LYMPH NODE, SEE COMMENT. - NO EVIDENCE OF MALIGNANCY.    Outside labs done 05/30/2017 showed WBC 4.9, HB 8.7 and plts 43,000.  She has a normal differential Labs done 05/24/2017 showed WBC 5, HB 8.2 and plts 33,000 with normal differential.  HCV negative.  Ferritin 76.    Due to thrombocytopenia she as transfused with 1 u pheresis plts on 06/09/2017.  Current status:  Pt  is seen today for follow-up.  She is here to go over labs after IV iron.    Problem List Patient Active Problem List   Diagnosis Date Noted  . Iron deficiency anemia due to chronic blood loss [D50.0] 06/21/2017  . Primary malignant neoplasm of left upper lobe of lung ([place]) [C34.12] 08/11/2015  . Candida esophagitis ([place]) [B37.81] 07/01/2015  .  Rectal bleeding [K62.5] 04/30/2015  . Colitis, acute [K52.9] 04/30/2015  . Viral gastroenteritis [A08.4] 04/01/2015  . Fracture of 5th metatarsal [S92.353A]   . Fracture of 4th metatarsal [S92.343A]   . Fracture of 3rd metatarsal [S92.333A]   . Fracture of 2nd metatarsal [S92.323A]   . Sepsis ([place]) [A41.9] 03/31/2015  . AKI (acute kidney injury) ([place]) [N17.9] 03/31/2015  . Leukocytosis [D72.829]   . Malignant neoplasm of right main bronchus ([place]) [C34.01] 02/12/2015  . Thrombocytopenia, unspecified ([place]) [D69.6] 09/04/2013  . Normocytic anemia [D64.9] 09/04/2013  . Diabetes mellitus, type 2 ([place]) [E11.9] 09/03/2013  . Essential hypertension [I10] 09/03/2013  . Tobacco dependence [F17.200] 09/03/2013  . Shoulder pain [M25.519] 03/16/2011  . Stiffness of joint, not elsewhere classified,  shoulder region [M25.619] 03/16/2011  . Muscle weakness (generalized) [M62.81] 03/16/2011  . Rheumatoid arthritis ([place]) [M06.9] 08/16/2010    Past Medical History Past Medical History:  Diagnosis Date  . Diabetes mellitus    x 20 yrs  . Fibromyalgia   . Fibromyalgia   . Lung cancer ([place]) 01/28/2015   left upper lobe lung /right upper lobe lung  . Lupus ([place])    sle  . Peripheral neuropathy   . RA (rheumatoid arthritis) ([place])    ra  . Thrombocytopenia, unspecified ([place]) 09/04/2013  . Tobacco use     Past Surgical History Past Surgical History:  Procedure Laterality Date  . ABDOMINAL HYSTERECTOMY    . APPENDECTOMY    . CATARACT EXTRACTION W/ INTRAOCULAR LENS IMPLANT  2010   right eye  . CESAREAN SECTION  x3  . COLONOSCOPY N/A 05/05/2015   Procedure: COLONOSCOPY;  Surgeon: Danie Binder, MD;  Location: [facility];  Service: Endoscopy;  Laterality: N/A;  . KNEE ARTHROSCOPY  yrs ago   left knee  . left arm surgery     multiple surgeries on  . OOPHORECTOMY    . SHOULDER HEMI-ARTHROPLASTY  01/27/2011   Procedure: SHOULDER HEMI-ARTHROPLASTY;  Surgeon: Nita Sells, MD;   Location: [facility] ORS;  Service: Orthopedics;  Laterality: Right;  interscaline right shoulder  . TRIGGER FINGER RELEASE  yrs ago   right hand    Family History Family History  Family history unknown: Yes     Social History  reports that she has been smoking cigarettes.  She has a 60.00 pack-year smoking history. She has never used smokeless tobacco. She reports that she does not drink alcohol or use drugs.  Medications  Current Outpatient Medications:  .  albuterol (PROVENTIL HFA;VENTOLIN HFA) 108 (90 BASE) MCG/ACT inhaler, Inhale 2 puffs into the lungs every 6 (six) hours as needed for wheezing or shortness of breath. Please instruct in usage., Disp: 1 Inhaler, Rfl: 0 .  alendronate (FOSAMAX) 70 MG tablet, Take 1 tablet by mouth Once a week. On Sunday, Disp: , Rfl:  .  aspirin EC 81 MG tablet, Take 1 tablet (81 mg total) by mouth daily. Restart 4/21, Disp: , Rfl:  .  folic acid (FOLVITE) 1 MG tablet, Take 1 mg by mouth daily., Disp: , Rfl:  .  glimepiride (AMARYL) 4 MG tablet, , Disp: ,  Rfl:  .  glipiZIDE-metformin (METAGLIP) 5-500 MG tablet, , Disp: , Rfl:  .  lisinopril (PRINIVIL,ZESTRIL) 2.5 MG tablet, , Disp: , Rfl: 3 .  methotrexate 2.5 MG tablet, Take 7.5 mg by mouth 2 (two) times a week., Disp: , Rfl:  .  metoprolol tartrate (LOPRESSOR) 25 MG tablet, Take 25 mg by mouth daily. Reported on 07/28/2015, Disp: , Rfl:  .  Multiple Vitamins-Minerals (PRESERVISION AREDS 2) CAPS, Take 2 capsules by mouth 2 (two) times daily., Disp: , Rfl:  .  naproxen sodium (ALEVE) 220 MG tablet, Take by mouth., Disp: , Rfl:  .  polycarbophil (FIBERCON) 625 MG tablet, Take 2 tablets (1,250 mg total) by mouth daily with supper., Disp: , Rfl:  .  tetrahydrozoline-zinc (VISINE-AC) 0.05-0.25 % ophthalmic solution, Place 2 drops into both eyes daily as needed (dry eyes)., Disp: , Rfl:  No current facility-administered medications for this visit.   Facility-Administered Medications Ordered in Other Visits:  .   0.9 %  sodium chloride infusion, , Intravenous, Continuous, Hurley Cisco, MD, Last Rate: 20 mL/hr at 10/04/10 1339  Allergies Bupropion and Lopid  [gemfibrozil]  Review of Systems Review of Systems - Oncology ROS negative   Physical Exam  Vitals Wt Readings from Last 3 Encounters:  07/11/17 138 lb 4.8 oz (62.7 kg)  06/28/17 140 lb (63.5 kg)  06/21/17 140 lb (63.5 kg)   Temp Readings from Last 3 Encounters:  07/13/17 98 F (36.7 C) (Oral)  07/11/17 (!) 97.3 F (36.3 C) (Oral)  07/05/17 97.8 F (36.6 C) (Oral)   BP Readings from Last 3 Encounters:  07/13/17 (!) 142/49  07/11/17 (!) 148/53  07/05/17 (!) 158/55   Pulse Readings from Last 3 Encounters:  07/13/17 (!) 104  07/11/17 89  07/05/17 84   Constitutional: Well-developed, well-nourished, and in no distress.   HENT: Head: Normocephalic and atraumatic.  Mouth/Throat: No oropharyngeal exudate. Mucosa moist. Eyes: Pupils are equal, round, and reactive to light. Conjunctivae are normal. No scleral icterus.  Neck: Normal range of motion. Neck supple. No JVD present.  Cardiovascular: Normal rate, regular rhythm and normal heart sounds.  Exam reveals no gallop and no friction rub.   No murmur heard. Pulmonary/Chest: Effort normal and breath sounds normal. No respiratory distress. No wheezes.No rales.  Abdominal: Soft. Bowel sounds are normal. No distension. There is no tenderness. There is no guarding.  Musculoskeletal: No edema or tenderness.  Lymphadenopathy: No cervical, axillary or supraclavicular adenopathy.  Neurological: Alert and oriented to person, place, and time. No cranial nerve deficit.  Skin: Skin is warm and dry. No rash noted. No erythema. No pallor.  Psychiatric: Affect and judgment normal.   Labs Appointment on 07/11/2017  Component Date Value Ref Range Status  . WBC 07/11/2017 5.3  4.0 - 10.5 K/uL Final  . RBC 07/11/2017 3.30* 3.87 - 5.11 MIL/uL Final  . Hemoglobin 07/11/2017 10.0* 12.0 - 15.0  g/dL Final  . HCT 07/11/2017 33.1* 36.0 - 46.0 % Final  . MCV 07/11/2017 100.3* 78.0 - 100.0 fL Final  . MCH 07/11/2017 30.3  26.0 - 34.0 pg Final  . MCHC 07/11/2017 30.2  30.0 - 36.0 g/dL Final  . RDW 07/11/2017 17.1* 11.5 - 15.5 % Final  . Platelets 07/11/2017 25* 150 - 400 K/uL Final   Comment: RESULT REPEATED AND VERIFIED CRITICAL RESULT CALLED TO, READ BACK BY AND VERIFIED WITH: PAGE,C AT 1005 BY HUFFINES,S ON 07/11/17. SPECIMEN CHECKED FOR CLOTS PLATELET COUNT CONFIRMED BY SMEAR   . Neutrophils Relative %  07/11/2017 63  % Final  . Neutro Abs 07/11/2017 3.3  1.7 - 7.7 K/uL Final  . Lymphocytes Relative 07/11/2017 22  % Final  . Lymphs Abs 07/11/2017 1.2  0.7 - 4.0 K/uL Final  . Monocytes Relative 07/11/2017 14  % Final  . Monocytes Absolute 07/11/2017 0.8  0.1 - 1.0 K/uL Final  . Eosinophils Relative 07/11/2017 1  % Final  . Eosinophils Absolute 07/11/2017 0.1  0.0 - 0.7 K/uL Final  . Basophils Relative 07/11/2017 0  % Final  . Basophils Absolute 07/11/2017 0.0  0.0 - 0.1 K/uL Final   Performed at [facility], [address] [place], [place] [address]  . Sodium 07/11/2017 138  135 - 145 mmol/L Final  . Potassium 07/11/2017 4.1  3.5 - 5.1 mmol/L Final  . Chloride 07/11/2017 102  98 - 111 mmol/L Final   Please note change in reference range.  . CO2 07/11/2017 27  22 - 32 mmol/L Final  . Glucose, Bld 07/11/2017 147* 70 - 99 mg/dL Final   Please note change in reference range.  . BUN 07/11/2017 18  8 - 23 mg/dL Final   Please note change in reference range.  . Creatinine, Ser 07/11/2017 0.95  0.44 - 1.00 mg/dL Final  . Calcium 07/11/2017 9.0  8.9 - 10.3 mg/dL Final  . Total Protein 07/11/2017 7.8  6.5 - 8.1 g/dL Final  . Albumin 07/11/2017 3.6  3.5 - 5.0 g/dL Final  . AST 07/11/2017 17  15 - 41 U/L Final  . ALT 07/11/2017 12  0 - 44 U/L Final   Please note change in reference range.  . Alkaline Phosphatase 07/11/2017 101  38 - 126 U/L Final  . Total Bilirubin 07/11/2017 0.6   0.3 - 1.2 mg/dL Final  . GFR calc non Af Amer 07/11/2017 57* >60 mL/min Final  . GFR calc Af Amer 07/11/2017 >60  >60 mL/min Final   Comment: (NOTE) The eGFR has been calculated using the CKD EPI equation. This calculation has not been validated in all clinical situations. eGFR's persistently <60 mL/min signify possible Chronic Kidney Disease.   Georgiann Hahn gap 07/11/2017 9  5 - 15 Final   Performed at [facility], [address], [place], [place] [address]  . LDH 07/11/2017 164  98 - 192 U/L Final   Performed at [facility], [address] [place], [place] [address]  . Ferritin 07/11/2017 943* 11 - 307 ng/mL Final   Performed at [facility], [place] [address] [place], [place] [address]     Pathology Orders Placed This Encounter  Procedures  . CT BONE MARROW BIOPSY & ASPIRATION    Standing Status:   Future    Standing Expiration Date:   10/12/2018    Order Specific Question:   Reason for Exam (SYMPTOM  OR DIAGNOSIS REQUIRED)    Answer:   persistant thrombocytopenia, history of Methotrexate    Order Specific Question:   Preferred imaging location?    Answer:   [facility]    Order Specific Question:   Radiology Contrast Protocol - do NOT remove file path    Answer:   [email or web address]  . 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    Answer:   [facility] [place]    Order Specific Question:   Radiology Contrast Protocol - do NOT remove file path    Answer:   [email or web address]  . CBC with Differential/Platelet    Standing Status:   Future    Standing Expiration Date:   07/12/2018  . Comprehensive metabolic panel    Standing Status:   Future    Standing Expiration Date:   07/12/2018  . Lactate dehydrogenase    Standing Status:   Future    Standing Expiration Date:   07/12/2018       Zoila Shutter MD

## 2017-07-21 ENCOUNTER — Other Ambulatory Visit: Payer: Self-pay | Admitting: Radiology

## 2017-07-24 ENCOUNTER — Encounter (HOSPITAL_COMMUNITY): Payer: Self-pay

## 2017-07-24 ENCOUNTER — Ambulatory Visit (HOSPITAL_COMMUNITY)
Admission: RE | Admit: 2017-07-24 | Discharge: 2017-07-24 | Disposition: A | Discharge status: Home / Self Care | Payer: Medicare Other | Source: Ambulatory Visit | Attending: Internal Medicine | Admitting: Internal Medicine

## 2017-07-24 DIAGNOSIS — F17210 Nicotine dependence, cigarettes, uncomplicated: Secondary | ICD-10-CM | POA: Insufficient documentation

## 2017-07-24 DIAGNOSIS — Z90710 Acquired absence of both cervix and uterus: Secondary | ICD-10-CM | POA: Insufficient documentation

## 2017-07-24 DIAGNOSIS — D696 Thrombocytopenia, unspecified: Secondary | ICD-10-CM | POA: Diagnosis not present

## 2017-07-24 DIAGNOSIS — Z9889 Other specified postprocedural states: Secondary | ICD-10-CM | POA: Diagnosis not present

## 2017-07-24 DIAGNOSIS — M329 Systemic lupus erythematosus, unspecified: Secondary | ICD-10-CM | POA: Diagnosis not present

## 2017-07-24 DIAGNOSIS — Z888 Allergy status to other drugs, medicaments and biological substances status: Secondary | ICD-10-CM | POA: Insufficient documentation

## 2017-07-24 DIAGNOSIS — Z791 Long term (current) use of non-steroidal anti-inflammatories (NSAID): Secondary | ICD-10-CM | POA: Diagnosis not present

## 2017-07-24 DIAGNOSIS — E1140 Type 2 diabetes mellitus with diabetic neuropathy, unspecified: Secondary | ICD-10-CM | POA: Insufficient documentation

## 2017-07-24 DIAGNOSIS — Z85118 Personal history of other malignant neoplasm of bronchus and lung: Secondary | ICD-10-CM | POA: Insufficient documentation

## 2017-07-24 DIAGNOSIS — Z7984 Long term (current) use of oral hypoglycemic drugs: Secondary | ICD-10-CM | POA: Diagnosis not present

## 2017-07-24 DIAGNOSIS — Z7982 Long term (current) use of aspirin: Secondary | ICD-10-CM | POA: Insufficient documentation

## 2017-07-24 DIAGNOSIS — M069 Rheumatoid arthritis, unspecified: Secondary | ICD-10-CM | POA: Diagnosis not present

## 2017-07-24 DIAGNOSIS — D539 Nutritional anemia, unspecified: Secondary | ICD-10-CM | POA: Insufficient documentation

## 2017-07-24 DIAGNOSIS — Z79899 Other long term (current) drug therapy: Secondary | ICD-10-CM | POA: Diagnosis not present

## 2017-07-24 DIAGNOSIS — K5730 Diverticulosis of large intestine without perforation or abscess without bleeding: Secondary | ICD-10-CM | POA: Insufficient documentation

## 2017-07-24 DIAGNOSIS — M797 Fibromyalgia: Secondary | ICD-10-CM | POA: Insufficient documentation

## 2017-07-24 DIAGNOSIS — D7589 Other specified diseases of blood and blood-forming organs: Secondary | ICD-10-CM | POA: Diagnosis not present

## 2017-07-24 DIAGNOSIS — I700 Atherosclerosis of aorta: Secondary | ICD-10-CM | POA: Diagnosis not present

## 2017-07-24 LAB — PROTIME-INR
INR: 1.07
Prothrombin Time: 13.9 seconds (ref 11.4–15.2)

## 2017-07-24 LAB — CBC WITH DIFFERENTIAL/PLATELET
BASOS ABS: 0 10*3/uL (ref 0.0–0.1)
BASOS PCT: 0 %
Eosinophils Absolute: 0.1 10*3/uL (ref 0.0–0.7)
Eosinophils Relative: 1 %
HEMATOCRIT: 30.6 % — AB (ref 36.0–46.0)
HEMOGLOBIN: 9.6 g/dL — AB (ref 12.0–15.0)
Lymphocytes Relative: 19 %
Lymphs Abs: 1.1 10*3/uL (ref 0.7–4.0)
MCH: 31.4 pg (ref 26.0–34.0)
MCHC: 31.4 g/dL (ref 30.0–36.0)
MCV: 100 fL (ref 78.0–100.0)
Monocytes Absolute: 0.9 10*3/uL (ref 0.1–1.0)
Monocytes Relative: 15 %
NEUTROS ABS: 3.8 10*3/uL (ref 1.7–7.7)
NEUTROS PCT: 65 %
Platelets: 22 10*3/uL — CL (ref 150–400)
RBC: 3.06 MIL/uL — ABNORMAL LOW (ref 3.87–5.11)
RDW: 18.1 % — ABNORMAL HIGH (ref 11.5–15.5)
WBC: 5.9 10*3/uL (ref 4.0–10.5)

## 2017-07-24 LAB — GLUCOSE, CAPILLARY: GLUCOSE-CAPILLARY: 153 mg/dL — AB (ref 70–99)

## 2017-07-24 MED ORDER — FLUMAZENIL 0.5 MG/5ML IV SOLN
INTRAVENOUS | Status: AC
  Filled 2017-07-24: qty 5

## 2017-07-24 MED ORDER — MIDAZOLAM HCL 2 MG/2ML IJ SOLN
INTRAMUSCULAR | Status: AC
  Filled 2017-07-24: qty 4

## 2017-07-24 MED ORDER — MIDAZOLAM HCL 2 MG/2ML IJ SOLN
INTRAMUSCULAR | Status: AC | PRN
  Administered 2017-07-24 (×3): 1 mg via INTRAVENOUS

## 2017-07-24 MED ORDER — SODIUM CHLORIDE 0.9 % IV SOLN
INTRAVENOUS | Status: DC
  Administered 2017-07-24: 09:00:00 via INTRAVENOUS

## 2017-07-24 MED ORDER — LIDOCAINE HCL (PF) 1 % IJ SOLN
INTRAMUSCULAR | Status: AC | PRN
  Administered 2017-07-24: 10 mL

## 2017-07-24 MED ORDER — FENTANYL CITRATE (PF) 100 MCG/2ML IJ SOLN
INTRAMUSCULAR | Status: AC
  Filled 2017-07-24: qty 4

## 2017-07-24 MED ORDER — FENTANYL CITRATE (PF) 100 MCG/2ML IJ SOLN
INTRAMUSCULAR | Status: AC | PRN
  Administered 2017-07-24 (×2): 50 ug via INTRAVENOUS

## 2017-07-24 MED ORDER — NALOXONE HCL 0.4 MG/ML IJ SOLN
INTRAMUSCULAR | Status: AC
  Filled 2017-07-24: qty 1

## 2017-07-24 NOTE — Procedures (Signed)
Thrombocytopenia  S/p CT BM ASP AND CORE BX  NO COMP STABLE EBL MIN PATH PENDING FULL REPORT IN PACS

## 2017-07-24 NOTE — Discharge Instructions (Signed)

## 2017-07-24 NOTE — H&P (Signed)
Chief Complaint: Patient was seen in consultation today for bone marrow biopsy at the request of Higgs,Vetta  Referring Physician(s): Higgs,Vetta  Supervising Physician: Daryll Brod  Patient Status: [facility] - Out-pt  History of Present Illness: [name] is a 77 y.o. female being worked up for thrombocytopenia. She is referred for bone marrow biopsy. PMHx, meds, labs, imaging, allergies reviewed. Feels well, no recent fevers, chills, illness. Has been NPO today as directed. Family at bedside.   Past Medical History:  Diagnosis Date  . Diabetes mellitus    x 20 yrs  . Fibromyalgia   . Fibromyalgia   . Lung cancer ([place]) 01/28/2015   left upper lobe lung /right upper lobe lung  . Lupus ([place])    sle  . Peripheral neuropathy   . RA (rheumatoid arthritis) ([place])    ra  . Thrombocytopenia, unspecified ([place]) 09/04/2013  . Tobacco use     Past Surgical History:  Procedure Laterality Date  . ABDOMINAL HYSTERECTOMY    . APPENDECTOMY    . CATARACT EXTRACTION W/ INTRAOCULAR LENS IMPLANT  2010   right eye  . CESAREAN SECTION  x3  . COLONOSCOPY N/A 05/05/2015   Procedure: COLONOSCOPY;  Surgeon: Danie Binder, MD;  Location: [facility] SUITE;  Service: Endoscopy;  Laterality: N/A;  . KNEE ARTHROSCOPY  yrs ago   left knee  . left arm surgery     multiple surgeries on  . OOPHORECTOMY    . SHOULDER HEMI-ARTHROPLASTY  01/27/2011   Procedure: SHOULDER HEMI-ARTHROPLASTY;  Surgeon: Nita Sells, MD;  Location: [facility] ORS;  Service: Orthopedics;  Laterality: Right;  interscaline right shoulder  . TRIGGER FINGER RELEASE  yrs ago   right hand    Allergies: Bupropion and Lopid  [gemfibrozil]  Medications: Prior to Admission medications   Medication Sig Start Date End Date Taking? Authorizing Provider  albuterol (PROVENTIL HFA;VENTOLIN HFA) 108 (90 BASE) MCG/ACT inhaler Inhale 2 puffs into the lungs every 6 (six) hours as needed for wheezing or shortness of breath. Please  instruct in usage. 09/06/13  Yes Samuella Cota, MD  alendronate (FOSAMAX) 70 MG tablet Take 1 tablet by mouth Once a week. On Sunday 04/15/11  Yes [provider]  aspirin EC 81 MG tablet Take 1 tablet (81 mg total) by mouth daily. Restart 4/21 05/05/15  Yes Samuella Cota, MD  folic acid (FOLVITE) 1 MG tablet Take 1 mg by mouth daily.   Yes [provider]  lisinopril (PRINIVIL,ZESTRIL) 2.5 MG tablet  04/24/17  Yes [provider]  methotrexate 2.5 MG tablet Take 7.5 mg by mouth 2 (two) times a week.   Yes [provider]  metoprolol tartrate (LOPRESSOR) 25 MG tablet Take 25 mg by mouth daily. Reported on 07/28/2015   Yes [provider]  Multiple Vitamins-Minerals (PRESERVISION AREDS 2) CAPS Take 2 capsules by mouth 2 (two) times daily.   Yes [provider]  naproxen sodium (ALEVE) 220 MG tablet Take by mouth.   Yes [provider]  polycarbophil (FIBERCON) 625 MG tablet Take 2 tablets (1,250 mg total) by mouth daily with supper. 05/05/15  Yes Samuella Cota, MD  tetrahydrozoline-zinc (VISINE-AC) 0.05-0.25 % ophthalmic solution Place 2 drops into both eyes daily as needed (dry eyes).   Yes [provider]  glimepiride (AMARYL) 4 MG tablet  03/20/17   [provider]  glipiZIDE-metformin (METAGLIP) 5-500 MG tablet  04/18/17   [provider]     Family History  Family history unknown: Yes    Social History   Socioeconomic History  . Marital status: Widowed    Spouse name: Not on file  . Number of children: Not on file  . Years of education: Not on file  . Highest education level: Not on file  Occupational History  . Not on file  Social Needs  . Financial resource strain: Not on file  . Food insecurity:    Worry: Not on file    Inability: Not on file  . Transportation needs:    Medical: Not on file    Non-medical: Not on file  Tobacco Use  . Smoking status: Current Every Day Smoker     Packs/day: 1.50    Years: 40.00    Pack years: 60.00    Types: Cigarettes  . Smokeless tobacco: Never Used  . Tobacco comment: 2 packs a day x 30 yrs  Substance and Sexual Activity  . Alcohol use: No    Alcohol/week: 0.0 oz  . Drug use: No  . Sexual activity: Not Currently  Lifestyle  . Physical activity:    Days per week: Not on file    Minutes per session: Not on file  . Stress: Not on file  Relationships  . Social connections:    Talks on phone: Not on file    Gets together: Not on file    Attends religious service: Not on file    Active member of club or organization: Not on file    Attends meetings of clubs or organizations: Not on file    Relationship status: Not on file  Other Topics Concern  . Not on file  Social History Narrative  . Not on file    Review of Systems: A 12 point ROS discussed and pertinent positives are indicated in the HPI above.  All other systems are negative.  Review of Systems  Vital Signs: BP (!) 126/41 Comment: taken twice for validation  Pulse 87   Temp 97.8 F (36.6 C) (Oral)   Resp 18   SpO2 98%   Physical Exam  Constitutional: She is oriented to person, place, and time. She appears well-developed. No distress.  HENT:  Head: Normocephalic.  Mouth/Throat: Oropharynx is clear and moist.  Neck: Normal range of motion. No JVD present.  Cardiovascular: Normal rate, regular rhythm and normal heart sounds.  Pulmonary/Chest: Effort normal and breath sounds normal.  Abdominal: Soft.  Neurological: She is alert and oriented to person, place, and time.  Skin: Skin is warm and dry.  Psychiatric: She has a normal mood and affect. Judgment normal.    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 History of bilateral axillary adenopathy and rheumatoid arthritis. EXAM:  NUCLEAR MEDICINE PET SKULL BASE TO THIGH TECHNIQUE: 11.9 mCi F-18 FDG was injected intravenously. Full-ring PET imaging was performed from the skull base to thigh after the radiotracer. CT data was obtained and used for attenuation correction and anatomic localization. Fasting blood glucose: 137 mg/dl COMPARISON:  03/27/2017 chest CT.  06/18/2014 PET-CT. FINDINGS: Mediastinal blood pool activity: SUV max 2.6 NECK: No hypermetabolic lymph nodes in the neck. Incidental CT findings: none CHEST: No hypermetabolic axillary, mediastinal or hilar lymph nodes. Top-normal size axillary lymph nodes bilaterally, which are non hypermetabolic (representative 0.9 cm right axillary node with max SUV 1.5 on series 3/image 89). No hypermetabolic pulmonary  findings. Treated 1.0 cm solid apical left upper lobe pulmonary nodule (series 3/image 62) is non hypermetabolic with max SUV 2.0. Treated 0.6 cm right upper lobe pulmonary nodule (series 3/image 80) is non hypermetabolic with max SUV 2.0. Incidental CT findings: Three-vessel coronary atherosclerosis. Atherosclerotic nonaneurysmal thoracic aorta. Moderate centrilobular emphysema. No acute consolidative airspace disease, lung masses or new significant pulmonary nodules. ABDOMEN/PELVIS: No abnormal hypermetabolic activity within the liver, pancreas, adrenal glands, or spleen. No hypermetabolic lymph nodes in the abdomen or pelvis. Incidental CT findings: Simple partially exophytic 2.3 cm lateral interpolar left renal cyst. Hysterectomy. Mild sigmoid diverticulosis. Atherosclerotic abdominal aorta with stable ectatic 2.5 cm infrarenal abdominal aorta. SKELETON: No focal hypermetabolic activity to suggest skeletal metastasis. Incidental CT findings: Right shoulder arthroplasty. IMPRESSION: 1. No evidence of hypermetabolic recurrent tumor at the sites of the treated pulmonary nodules in the upper lobes bilaterally. 2. No evidence of hypermetabolic metastatic disease. Top-normal size  axillary lymph nodes are non hypermetabolic bilaterally. 3. Chronic findings include: Aortic Atherosclerosis (ICD10-I70.0). Stable ectatic 2.5 cm infrarenal abdominal aorta. Three-vessel coronary atherosclerosis. Mild sigmoid diverticulosis. Electronically Signed   By: Ilona Sorrel M.D.   On: 06/27/2017 14:49    Labs:  CBC: Recent Labs    06/09/17 1007 06/15/17 1018 06/20/17 1335 07/11/17 0904  WBC 4.1 4.7 5.5 5.3  HGB 8.8* 9.5* 9.8* 10.0*  HCT 29.0* 30.8* 32.2* 33.1*  PLT 27* 38* 41* 25*    COAGS: Recent Labs    06/09/17 1007  INR 1.08  APTT 28    BMP: Recent Labs    03/27/17 0923 06/09/17 1007 06/20/17 1335 07/11/17 0904  NA  --  134* 132* 138  K  --  4.4 4.6 4.1  CL  --  101 100* 102  CO2  --  _0 GLUCOSE  --  131* 131* 147*  BUN  --  28* 23* 18  CALCIUM  --  9.3 9.0 9.0  CREATININE 0.90 0.86 1.01* 0.95  GFRNONAA  --  >60 53* 57*  GFRAA  --  >60 >60 >60    LIVER FUNCTION TESTS: Recent Labs    06/09/17 1007 06/20/17 1335 07/11/17 0904  BILITOT 0.6 0.6 0.6  AST _1 ALT 11* 12* 12  ALKPHOS 73 93 101  PROT 7.8 7.7 7.8  ALBUMIN 3.7 3.8 3.6    TUMOR MARKERS: No results for input(s): AFPTM, CEA, CA199, CHROMGRNA in the last 8760 hours.  Assessment and Plan: Thrombocytopenia For bone marrow biopsy Labs ok]. Risks and benefits discussed with the patient including, but not limited to bleeding, infection, damage to adjacent structures or low yield requiring additional tests.  All of the patient's questions were answered, patient is agreeable to proceed. Consent signed and in chart.     Thank you for this interesting consult.  I greatly enjoyed meeting [name] and look forward to participating in their care.  A copy of this report was sent to the requesting provider on this date.  Electronically Signed: Ascencion Dike, PA-C 07/24/2017, 9:47 AM   I spent a total of 20 minutes in face to face in clinical consultation, greater  than 50% of which was counseling/coordinating care for bone marrow biopsy

## 2017-07-24 NOTE — Progress Notes (Signed)
CRITICAL VALUE ALERT  Critical Value:  Platelets 22  Date & Time Notied:  07/24/17 @ 1016  Provider Notified: Ascencion Dike, PA  Orders Received/Actions taken: Lennette Bihari made aware. No further orders at this time. States to proceed with procedure.

## 2017-07-28 ENCOUNTER — Other Ambulatory Visit (HOSPITAL_COMMUNITY): Payer: Self-pay | Admitting: Internal Medicine

## 2017-07-28 ENCOUNTER — Telehealth (HOSPITAL_COMMUNITY): Payer: Self-pay

## 2017-07-28 DIAGNOSIS — D696 Thrombocytopenia, unspecified: Secondary | ICD-10-CM

## 2017-07-28 NOTE — Telephone Encounter (Signed)
Dr. Walden Field received call from pathologist, Dr. Melina Copa, concerning patient. Dr. Walden Field requested patients appt be moved up to this coming Monday with labs. Appointment rescheduled and patient notified with understanding verbalized.

## 2017-07-31 ENCOUNTER — Other Ambulatory Visit: Payer: Self-pay

## 2017-07-31 ENCOUNTER — Inpatient Hospital Stay (HOSPITAL_COMMUNITY): Payer: Medicare Other

## 2017-07-31 ENCOUNTER — Inpatient Hospital Stay (HOSPITAL_COMMUNITY): Payer: Medicare Other | Attending: Internal Medicine | Admitting: Internal Medicine

## 2017-07-31 ENCOUNTER — Encounter (HOSPITAL_COMMUNITY): Payer: Self-pay | Admitting: Internal Medicine

## 2017-07-31 ENCOUNTER — Ambulatory Visit (HOSPITAL_COMMUNITY): Payer: Medicare Other | Admitting: Internal Medicine

## 2017-07-31 ENCOUNTER — Other Ambulatory Visit (HOSPITAL_COMMUNITY): Payer: Self-pay | Admitting: Pharmacist

## 2017-07-31 VITALS — BP 149/69 | HR 79 | Temp 98.2°F | Resp 18 | Wt 138.0 lb

## 2017-07-31 DIAGNOSIS — Z7982 Long term (current) use of aspirin: Secondary | ICD-10-CM | POA: Diagnosis not present

## 2017-07-31 DIAGNOSIS — D696 Thrombocytopenia, unspecified: Secondary | ICD-10-CM

## 2017-07-31 DIAGNOSIS — Z5111 Encounter for antineoplastic chemotherapy: Secondary | ICD-10-CM | POA: Insufficient documentation

## 2017-07-31 DIAGNOSIS — Z79899 Other long term (current) drug therapy: Secondary | ICD-10-CM | POA: Insufficient documentation

## 2017-07-31 DIAGNOSIS — F17210 Nicotine dependence, cigarettes, uncomplicated: Secondary | ICD-10-CM

## 2017-07-31 DIAGNOSIS — E119 Type 2 diabetes mellitus without complications: Secondary | ICD-10-CM | POA: Diagnosis not present

## 2017-07-31 DIAGNOSIS — D469 Myelodysplastic syndrome, unspecified: Secondary | ICD-10-CM | POA: Insufficient documentation

## 2017-07-31 DIAGNOSIS — D500 Iron deficiency anemia secondary to blood loss (chronic): Secondary | ICD-10-CM | POA: Diagnosis not present

## 2017-07-31 DIAGNOSIS — Z923 Personal history of irradiation: Secondary | ICD-10-CM | POA: Insufficient documentation

## 2017-07-31 DIAGNOSIS — D46Z Other myelodysplastic syndromes: Secondary | ICD-10-CM

## 2017-07-31 DIAGNOSIS — Z7984 Long term (current) use of oral hypoglycemic drugs: Secondary | ICD-10-CM | POA: Insufficient documentation

## 2017-07-31 DIAGNOSIS — Z85118 Personal history of other malignant neoplasm of bronchus and lung: Secondary | ICD-10-CM | POA: Diagnosis not present

## 2017-07-31 LAB — CBC WITH DIFFERENTIAL/PLATELET
BASOS ABS: 0 10*3/uL (ref 0.0–0.1)
BASOS PCT: 0 %
EOS ABS: 0.1 10*3/uL (ref 0.0–0.7)
EOS PCT: 1 %
HCT: 25.7 % — ABNORMAL LOW (ref 36.0–46.0)
HEMOGLOBIN: 8 g/dL — AB (ref 12.0–15.0)
Lymphocytes Relative: 25 %
Lymphs Abs: 1.4 10*3/uL (ref 0.7–4.0)
MCH: 31.5 pg (ref 26.0–34.0)
MCHC: 31.1 g/dL (ref 30.0–36.0)
MCV: 101.2 fL — ABNORMAL HIGH (ref 78.0–100.0)
MONO ABS: 0.8 10*3/uL (ref 0.1–1.0)
MONOS PCT: 15 %
Neutro Abs: 3.3 10*3/uL (ref 1.7–7.7)
Neutrophils Relative %: 59 %
Platelets: 23 10*3/uL — CL (ref 150–400)
RBC: 2.54 MIL/uL — ABNORMAL LOW (ref 3.87–5.11)
RDW: 18.9 % — ABNORMAL HIGH (ref 11.5–15.5)
WBC: 5.6 10*3/uL (ref 4.0–10.5)

## 2017-07-31 LAB — COMPREHENSIVE METABOLIC PANEL
ALBUMIN: 3.6 g/dL (ref 3.5–5.0)
ALK PHOS: 99 U/L (ref 38–126)
ALT: 12 U/L (ref 0–44)
ANION GAP: 7 (ref 5–15)
AST: 20 U/L (ref 15–41)
BILIRUBIN TOTAL: 0.7 mg/dL (ref 0.3–1.2)
BUN: 22 mg/dL (ref 8–23)
CHLORIDE: 100 mmol/L (ref 98–111)
CO2: 25 mmol/L (ref 22–32)
Calcium: 9 mg/dL (ref 8.9–10.3)
Creatinine, Ser: 1 mg/dL (ref 0.44–1.00)
GFR calc non Af Amer: 53 mL/min — ABNORMAL LOW (ref >60)
Glucose, Bld: 116 mg/dL — ABNORMAL HIGH (ref 70–99)
Potassium: 4.5 mmol/L (ref 3.5–5.1)
Sodium: 132 mmol/L — ABNORMAL LOW (ref 135–145)
Total Protein: 7.7 g/dL (ref 6.5–8.1)

## 2017-07-31 LAB — LACTATE DEHYDROGENASE: LDH: 177 U/L (ref 98–192)

## 2017-07-31 NOTE — Patient Instructions (Addendum)
[facility] Discharge Instructions  Today you saw Dr. Higgs.    Thank you for choosing [facility] at [facility] to provide your oncology and hematology care.  To afford each patient quality time with our provider, please arrive at least 15 minutes before your scheduled appointment time.   If you have a lab appointment with the Cancer Center please come in thru the  Main Entrance and check in at the main information desk  You need to re-schedule your appointment should you arrive 10 or more minutes late.  We strive to give you quality time with our providers, and arriving late affects you and other patients whose appointments are after yours.  Also, if you no show three or more times for appointments you may be dismissed from the clinic at the providers discretion.     Again, thank you for choosing [facility].  Our hope is that these requests will decrease the amount of time that you wait before being seen by our physicians.       _____________________________________________________________  Should you have questions after your visit to  Cancer Center, please contact our office at [phone] between the hours of 8:30 a.m. and 4:30 p.m.  Voicemails left after 4:30 p.m. will not be returned until the following business day.  For prescription refill requests, have your pharmacy contact our office.       Resources For Cancer Patients and their Caregivers ? American Cancer Society: Can assist with transportation, wigs, general needs, runs Look Good Feel Better.        1-888-227-6333 ? Cancer Care: Provides financial assistance, online support groups, medication/co-pay assistance.  1-800-813-HOPE (4673) ? [facility] Assists [place] cancer patients and their families through emotional , educational and financial support.  336-427-4357 ? [place] DSS Where to apply for  food stamps, Medicaid and utility assistance. 336-342-1394 ? [facility]: Transportation to medical appointments. 336-347-2287 ? Social Security Administration: May apply for disability if have a Stage IV cancer. 336-342-7796 1-800-772-1213 ? [place] Aging, Disability and Transit Services: Assists with nutrition, care and transit needs. 336-349-2343  Cancer Center Support Programs:   > Cancer Support Group  2nd Tuesday of the month 1pm-2pm, Journey Room   > Creative Journey  3rd Tuesday of the month 1130am-1pm, Journey Room    

## 2017-07-31 NOTE — Progress Notes (Signed)
Diagnosis MDS (myelodysplastic syndrome), high grade (HCC) - Plan: CBC with Differential/Platelet, Comprehensive metabolic panel, Lactate dehydrogenase, CBC with Differential/Platelet, Practitioner attestation of consent, Complete patient signature process for consent form, Care order/instruction, 0.9 %  sodium chloride infusion, Prepare Pheresed Platelets, Transfuse platelet pheresis, acetaminophen (TYLENOL) tablet 650 mg, diphenhydrAMINE (BENADRYL) capsule 25 mg, Prepare Pheresed Platelets, Practitioner attestation of consent, Complete patient signature process for consent form, Care order/instruction, Transfuse platelet pheresis, 0.9 %  sodium chloride infusion, CANCELED: Prepare Pheresed Platelets, CANCELED: Prepare Pheresed Platelets  Staging Cancer Staging No matching staging information was found for the patient.  Assessment and Plan:  1.  MDS.  Pt has thrombocytopenia dating back to at least 2017.  She had also been treated with MTX.  Pt was recommended for bone marrow biopsy that was done on 07/24/2017 that showed  Diagnosis Bone Marrow, Aspirate,Biopsy, and Clot, right iliac and core BONE MARROW: - HYPERCELLULAR MARROW (70%) WITH DYSMEGAKARYOCYTOPOIESIS, MILD ERYTHROID ATYPIA AND BORDERLINE INCREASED BLASTS (4%) - SEE COMMENT PERIPHERAL BLOOD: - MACROCYTIC ANEMIA AND THROMBOCYTOPENIA - SEE COMPLETE BLOOD COUNT Diagnosis Note Overall, the findings in this marrow are compatible with a myelodsyplastic syndrome and given the patient's history of treatment for lung cancer this process may represent a therapy related myeloid neoplasm. While it is difficult to quantify the blast percentage on the core biopsy by CD34 immunohistochemistry, the atypical manner in which the blasts are clustering is concerning for an evolving high grade dsyplasia/early acute leukemic process and therefore close clinical follow-up is recommended. Correlation with cytogenetics and FISH is recommended.  Long talk  held with pt today.  She has evidence of MDS with IPSS score of 4-4.5 placing her in an intermediate to high risk category.  She has 4% blasts noted on flow cytometry.    I have discussed with the patient standard recommendation for Vidaza 75 mg/m2 daily for 5-7 days.  Will begin therapy with 5 day course.  She has a history of lung cancer, but did not receive chemotherapy but was treated with RT.  Side effects of the medication were reviewed with the pt and she was provided written information regarding diagnosis and treatment.  I have discussed with her risk for progression to AML especially in light of the 4% blasts noted on recent bone marrow biopsy.   She will continue to have labs monitored closely as treatment proceeds.  All questions answered and she expressed understanding of the information presented.   2.  Thrombocytopenia and anemia.  Due to MDS.  Labs done 07/31/2017 showed WBC 5.6 HB 8 plts 23,000.  She will be set up for plt transfusion this week prior to therapy start.  She will have repeat labs on Thursday 08/03/2017.  Will transfuse with PRBCs if HB <= 7g/dl.  Pt was treated with Injectafer on 06/2017 due to IDA .   3.  RA.  Pt was on MTX.  This was held due to thrombocytopenia.  Pt should follow-up with PCP.    4  Smoking.   Cessation recommended especially in light of lung cancer history.    5  Lung cancer.  She has undergone SBRT by Dr. Lisbeth Renshaw to a right and left pulmonary nodule suspicious for primary bronchogenic carcinoma with hypermetabolic activity on PET imaging.  She received 54 Gy in 3 fractions to the right lung nodule and 50 Gy in 5 fractions to the left lung nodule.  She was followed by Dr. Audree Camel (medical oncology) at [facility].  Unclear  when she was last seen by Dr. Jacquiline Doe.    She had a CT chest done 03/27/2017 that showed  IMPRESSION: 1. Stable bilateral upper lobe nodules with surrounding post treatment changes. 2.  Aortic atherosclerosis  (ICD10-170.0). 3.  Emphysema (ICD10-J43.9).  PET scan was scheduled for 06/26/2017 due to lung cancer history and adenopathy that was noted on mammogram.  She had Pet scan done 06/26/2017 that showed  IMPRESSION: 1. No evidence of hypermetabolic recurrent tumor at the sites of the treated pulmonary nodules in the upper lobes bilaterally. 2. No evidence of hypermetabolic metastatic disease. Top-normal size axillary lymph nodes are non hypermetabolic bilaterally. 3. Chronic findings include: Aortic Atherosclerosis (ICD10-I70.0). Stable ectatic 2.5 cm infrarenal abdominal aorta. Three-vessel coronary atherosclerosis. Mild sigmoid diverticulosis.  She will have repeat imaging in 6 months.    6.  HTN.  BP is 149/69.  Follow-up with PCP.    7  Nausea prophylaxis.  Pt will be treated with Zofran ODT.    Interval history:  Historical data obtained from noted dated 07/11/2017:  77 yr old female seen for consultation due to anemia and thrombocytopenia.  She was previously seen by PA Kefalas 05/05/2015 for thrombocytopenia while hospitalized.  She has rheumatoid arthritis and is being treated with methotrexate.  Pt is also on baby ASA.   She has undergone SBRT by Dr. Lisbeth Renshaw to a right and left pulmonary nodule suspicious for primary bronchogenic carcinoma with hypermetabolic activity on PET imaging.  She received 54 Gy in 3 fractions to the right lung nodule and 50 Gy in 5 fractions to the left lung nodule.  She was followed by Dr. Audree Camel (medical oncology) at [facility].  Unclear when she was last seen by Dr. Jacquiline Doe.    She underwent colonoscopy by Dr. Oneida Alar 05/05/2015 with no obvious source for bleeding.    Pt has thrombocytopenia dating back to at least 2017.  She had a CT chest done 03/27/2017 that showed  IMPRESSION: 1. Stable bilateral upper lobe nodules with surrounding post treatment changes. 2.  Aortic atherosclerosis (ICD10-170.0). 3.  Emphysema  (ICD10-J43.9).  Bilateral diagnostic mammogram done 03/01/2016 showed  IMPRESSION: 1. No mammographic evidence for primary breast malignancy. 2. Multiple enlarged axillary lymph nodes bilaterally, representing a change compared with earlier CT exams. 3. Considerations include lymphoproliferative disease, atypical metastatic disease from lung cancer, occult breast malignancy, or reactive lymph nodes. Psoriasis would be an unusual cause for lymphadenopathy without associated superinfection.  RECOMMENDATION: Consider ultrasound-guided core biopsy of one of the larger lymph nodes in the right axilla.  Pt underwent USN core biopsy on 03/22/2016 that showed  Diagnosis Lymph node, needle/core biopsy, right axilla - BENIGN REACTIVE LYMPH NODE, SEE COMMENT. - NO EVIDENCE OF MALIGNANCY.  Outside labs done 05/30/2017 showed WBC 4.9, HB 8.7 and plts 43,000.  She has a normal differential Labs done 05/24/2017 showed WBC 5, HB 8.2 and plts 33,000 with normal differential.  HCV negative.  Ferritin 76.    The pt had thrombocytopenia dating back to at least 2017.  She had a CT chest done 03/27/2017 that showed  IMPRESSION: 1. Stable bilateral upper lobe nodules with surrounding post treatment changes. 2.  Aortic atherosclerosis (ICD10-170.0). 3.  Emphysema (ICD10-J43.9).  Bilateral diagnostic mammogram done 03/01/2016 showed  IMPRESSION: 1. No mammographic evidence for primary breast malignancy. 2. Multiple enlarged axillary lymph nodes bilaterally, representing a change compared with earlier CT exams. 3. Considerations include lymphoproliferative disease, atypical metastatic disease from lung cancer, occult breast malignancy, or  reactive lymph nodes. Psoriasis would be an unusual cause for lymphadenopathy without associated superinfection.  Pt underwent USN core biopsy on 03/22/2016 that showed  Diagnosis Lymph node, needle/core biopsy, right axilla - BENIGN REACTIVE LYMPH NODE, SEE COMMENT. -  NO EVIDENCE OF MALIGNANCY.   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    Pt also has adenopathy and had  PET scan done 06/26/2017 due to lung cancer history and adenopathy that was noted on bilateral diagnostic mammogram done 03/01/2016 that showed  IMPRESSION: 1. No mammographic evidence for primary breast malignancy. 2. Multiple enlarged axillary lymph nodes bilaterally, representing a change compared with earlier CT exams. 3. Considerations include lymphoproliferative disease, atypical metastatic disease from lung cancer, occult breast malignancy, or reactive lymph nodes. Psoriasis would be an unusual cause for lymphadenopathy without associated superinfection.  RECOMMENDATION: Consider ultrasound-guided core biopsy of one of the larger lymph nodes in the right axilla.  Pt underwent USN core biopsy on 03/22/2016 that showed  Diagnosis Lymph node, needle/core biopsy, right axilla - BENIGN REACTIVE LYMPH NODE, SEE COMMENT. - NO EVIDENCE OF MALIGNANCY.  Pt had Pet scan done 06/26/2017 that showed  IMPRESSION: 1. No evidence of hypermetabolic recurrent tumor at the sites of the treated pulmonary nodules in the upper lobes bilaterally. 2. No evidence of hypermetabolic metastatic disease. Top-normal size axillary lymph nodes are non hypermetabolic bilaterally. 3. Chronic findings include: Aortic Atherosclerosis (ICD10-I70.0). Stable ectatic 2.5 cm infrarenal abdominal aorta. Three-vessel coronary atherosclerosis. Mild sigmoid diverticulosis.  Pt also has Lung cancer and underwent SBRT by Dr. Lisbeth Renshaw to a right and left pulmonary nodule suspicious for primary bronchogenic carcinoma with hypermetabolic activity on PET imaging.  She received 54 Gy in 3 fractions to the right lung nodule and 50 Gy in 5 fractions to the left lung nodule.  She was followed by Dr. Audree Camel (medical oncology) at [facility].  Unclear when she was last seen by Dr. Jacquiline Doe.    She had a CT chest done 03/27/2017 that showed  IMPRESSION: 1. Stable bilateral upper lobe nodules with surrounding post treatment changes. 2.  Aortic atherosclerosis (ICD10-170.0). 3.  Emphysema (ICD10-J43.9).  PET scan done 06/26/2017 due to lung cancer history and adenopathy that was noted on mammogram.  She had Pet scan done 06/26/2017 that showed  IMPRESSION: 1. No evidence of hypermetabolic recurrent tumor at the sites of the treated pulmonary nodules in the upper lobes bilaterally. 2. No evidence of hypermetabolic metastatic disease. Top-normal size axillary lymph nodes are non hypermetabolic bilaterally. 3. Chronic findings include: Aortic Atherosclerosis (ICD10-I70.0). Stable ectatic 2.5 cm infrarenal abdominal aorta. Three-vessel coronary atherosclerosis. Mild sigmoid diverticulosis.  Current status:  Pt is seen today for follow-up.  She is here to go over bone marrow biopsy and labs.      MDS (myelodysplastic syndrome), high grade ([place])   07/31/2017 Initial Diagnosis    MDS (myelodysplastic syndrome), high grade ([place])      08/07/2017 -  Chemotherapy    The patient had azaCITIDine (VIDAZA) 125 mg in sodium chloride 0.9 % 50 mL chemo infusion, 75 mg/m2 = 125 mg, Intravenous, Once, 0 of 4 cycles  for chemotherapy treatment.         Problem List Patient Active Problem List   Diagnosis Date Noted  . MDS (myelodysplastic syndrome), high grade ([place]) [D46.Z] 07/31/2017  . Iron deficiency anemia due to chronic blood loss [D50.0] 06/21/2017  . Primary malignant neoplasm  of left upper lobe of lung ([place]) [C34.12] 08/11/2015  . Candida esophagitis ([place]) [B37.81] 07/01/2015  . Rectal bleeding [K62.5] 04/30/2015  . Colitis, acute [K52.9] 04/30/2015  . Viral gastroenteritis [A08.4] 04/01/2015  . Fracture of 5th metatarsal [S92.353A]   . Fracture of 4th metatarsal [S92.343A]   . Fracture of 3rd metatarsal [S92.333A]   .  Fracture of 2nd metatarsal [S92.323A]   . Sepsis ([place]) [A41.9] 03/31/2015  . AKI (acute kidney injury) ([place]) [N17.9] 03/31/2015  . Leukocytosis [D72.829]   . Malignant neoplasm of right main bronchus ([place]) [C34.01] 02/12/2015  . Thrombocytopenia, unspecified ([place]) [D69.6] 09/04/2013  . Normocytic anemia [D64.9] 09/04/2013  . Diabetes mellitus, type 2 ([place]) [E11.9] 09/03/2013  . Essential hypertension [I10] 09/03/2013  . Tobacco dependence [F17.200] 09/03/2013  . Shoulder pain [M25.519] 03/16/2011  . Stiffness of joint, not elsewhere classified,  shoulder region [M25.619] 03/16/2011  . Muscle weakness (generalized) [M62.81] 03/16/2011  . Rheumatoid arthritis ([place]) [M06.9] 08/16/2010    Past Medical History Past Medical History:  Diagnosis Date  . Diabetes mellitus    x 20 yrs  . Fibromyalgia   . Fibromyalgia   . Lung cancer ([place]) 01/28/2015   left upper lobe lung /right upper lobe lung  . Lupus ([place])    sle  . Peripheral neuropathy   . RA (rheumatoid arthritis) ([place])    ra  . Thrombocytopenia, unspecified ([place]) 09/04/2013  . Tobacco use     Past Surgical History Past Surgical History:  Procedure Laterality Date  . ABDOMINAL HYSTERECTOMY    . APPENDECTOMY    . CATARACT EXTRACTION W/ INTRAOCULAR LENS IMPLANT  2010   right eye  . CESAREAN SECTION  x3  . COLONOSCOPY N/A 05/05/2015   Procedure: COLONOSCOPY;  Surgeon: Danie Binder, MD;  Location: [facility];  Service: Endoscopy;  Laterality: N/A;  . KNEE ARTHROSCOPY  yrs ago   left knee  . left arm surgery     multiple surgeries on  . OOPHORECTOMY    . SHOULDER HEMI-ARTHROPLASTY  01/27/2011   Procedure: SHOULDER HEMI-ARTHROPLASTY;  Surgeon: Nita Sells, MD;  Location: [facility]S;  Service: Orthopedics;  Laterality: Right;  interscaline right shoulder  . TRIGGER FINGER RELEASE  yrs ago   right hand    Family History Family History  Family history unknown: Yes     Social History  reports that she has been  smoking cigarettes.  She has a 60.00 pack-year smoking history. She has never used smokeless tobacco. She reports that she does not drink alcohol or use drugs.  Medications  Current Outpatient Medications:  .  albuterol (PROVENTIL HFA;VENTOLIN HFA) 108 (90 BASE) MCG/ACT inhaler, Inhale 2 puffs into the lungs every 6 (six) hours as needed for wheezing or shortness of breath. Please instruct in usage., Disp: 1 Inhaler, Rfl: 0 .  alendronate (FOSAMAX) 70 MG tablet, Take 1 tablet by mouth Once a week. On Sunday, Disp: , Rfl:  .  aspirin EC 81 MG tablet, Take 1 tablet (81 mg total) by mouth daily. Restart 4/21, Disp: , Rfl:  .  folic acid (FOLVITE) 1 MG tablet, Take 1 mg by mouth daily., Disp: , Rfl:  .  glimepiride (AMARYL) 4 MG tablet, , Disp: , Rfl:  .  glipiZIDE-metformin (METAGLIP) 5-500 MG tablet, , Disp: , Rfl:  .  lisinopril (PRINIVIL,ZESTRIL) 2.5 MG tablet, , Disp: , Rfl: 3 .  methotrexate 2.5 MG tablet, Take 7.5 mg by mouth 2 (two) times a week., Disp: , Rfl:  .  metoprolol tartrate (LOPRESSOR) 25 MG tablet, Take 25 mg by mouth daily. Reported on 07/28/2015, Disp: , Rfl:  .  Multiple Vitamins-Minerals (PRESERVISION AREDS 2) CAPS, Take 2 capsules by mouth 2 (two) times daily., Disp: , Rfl:  .  naproxen sodium (ALEVE) 220 MG tablet, Take by mouth., Disp: , Rfl:  .  polycarbophil (FIBERCON) 625 MG tablet, Take 2 tablets (1,250 mg total) by mouth daily with supper., Disp: , Rfl:  .  tetrahydrozoline-zinc (VISINE-AC) 0.05-0.25 % ophthalmic solution, Place 2 drops into both eyes daily as needed (dry eyes)., Disp: , Rfl:  No current facility-administered medications for this visit.   Facility-Administered Medications Ordered in Other Visits:  .  0.9 %  sodium chloride infusion, , Intravenous, Continuous, Hurley Cisco, MD, Last Rate: 20 mL/hr at 10/04/10 1339  Allergies Bupropion and Lopid  [gemfibrozil]  Review of Systems Review of Systems - Oncology ROS negative other than fatigue,  bruising.     Physical Exam  Vitals Wt Readings from Last 3 Encounters:  07/31/17 138 lb (62.6 kg)  07/11/17 138 lb 4.8 oz (62.7 kg)  06/28/17 140 lb (63.5 kg)   Temp Readings from Last 3 Encounters:  07/31/17 98.2 F (36.8 C) (Oral)  07/24/17 97.8 F (36.6 C) (Oral)  07/13/17 98 F (36.7 C) (Oral)   BP Readings from Last 3 Encounters:  07/31/17 (!) 149/69  07/24/17 (!) 129/45  07/13/17 (!) 142/49   Pulse Readings from Last 3 Encounters:  07/31/17 79  07/24/17 89  07/13/17 (!) 104   Constitutional: Elderly female in no distress.   HENT: Head: Normocephalic and atraumatic.  Mouth/Throat: No oropharyngeal exudate. Mucosa moist. Eyes: Pupils are equal, round, and reactive to light. Conjunctivae are normal. No scleral icterus.  Neck: Normal range of motion. Neck supple. No JVD present.  Cardiovascular: Normal rate, regular rhythm and normal heart sounds.  Exam reveals no gallop and no friction rub.   No murmur heard. Pulmonary/Chest: Effort normal and breath sounds normal. No respiratory distress. No wheezes.No rales.  Abdominal: Soft. Bowel sounds are normal. No distension. There is no tenderness. There is no guarding.  Musculoskeletal: No edema or tenderness.  Lymphadenopathy: No cervical, axillary or supraclavicular adenopathy.  Neurological: Alert and oriented to person, place, and time. No cranial nerve deficit.  Skin: Skin is warm and dry. No rash noted. No erythema. No pallor.  Psychiatric: Affect and judgment normal.   Labs Lab on 07/31/2017  Component Date Value Ref Range Status  . WBC 07/31/2017 5.6  4.0 - 10.5 K/uL Final   WHITE COUNT CONFIRMED ON SMEAR  . RBC 07/31/2017 2.54* 3.87 - 5.11 MIL/uL Final  . Hemoglobin 07/31/2017 8.0* 12.0 - 15.0 g/dL Final  . HCT 07/31/2017 25.7* 36.0 - 46.0 % Final  . MCV 07/31/2017 101.2* 78.0 - 100.0 fL Final  . MCH 07/31/2017 31.5  26.0 - 34.0 pg Final  . MCHC 07/31/2017 31.1  30.0 - 36.0 g/dL Final  . RDW 07/31/2017  18.9* 11.5 - 15.5 % Final  . Platelets 07/31/2017 23* 150 - 400 K/uL Final   Comment: REPEATED TO VERIFY CRITICAL RESULT CALLED TO, READ BACK BY AND VERIFIED WITH: TRAVIS,A '@1510'$  BY MATTHEWS, B 7.15.19   . Neutrophils Relative % 07/31/2017 59  % Final  . Neutro Abs 07/31/2017 3.3  1.7 - 7.7 K/uL Final  . Lymphocytes Relative 07/31/2017 25  % Final  . Lymphs Abs 07/31/2017 1.4  0.7 - 4.0 K/uL Final  . Monocytes Relative 07/31/2017 15  % Final  .  Monocytes Absolute 07/31/2017 0.8  0.1 - 1.0 K/uL Final  . Eosinophils Relative 07/31/2017 1  % Final  . Eosinophils Absolute 07/31/2017 0.1  0.0 - 0.7 K/uL Final  . Basophils Relative 07/31/2017 0  % Final  . Basophils Absolute 07/31/2017 0.0  0.0 - 0.1 K/uL Final  . WBC Morphology 07/31/2017 ATYPICAL LYMPHOCYTES   Final   Performed at [facility], [address] [place], [place] [address]  . Sodium 07/31/2017 132* 135 - 145 mmol/L Final  . Potassium 07/31/2017 4.5  3.5 - 5.1 mmol/L Final  . Chloride 07/31/2017 100  98 - 111 mmol/L Final   Please note change in reference range.  . CO2 07/31/2017 25  22 - 32 mmol/L Final  . Glucose, Bld 07/31/2017 116* 70 - 99 mg/dL Final   Please note change in reference range.  . BUN 07/31/2017 22  8 - 23 mg/dL Final   Please note change in reference range.  . Creatinine, Ser 07/31/2017 1.00  0.44 - 1.00 mg/dL Final  . Calcium 07/31/2017 9.0  8.9 - 10.3 mg/dL Final  . Total Protein 07/31/2017 7.7  6.5 - 8.1 g/dL Final  . Albumin 07/31/2017 3.6  3.5 - 5.0 g/dL Final  . AST 07/31/2017 20  15 - 41 U/L Final  . ALT 07/31/2017 12  0 - 44 U/L Final   Please note change in reference range.  . Alkaline Phosphatase 07/31/2017 99  38 - 126 U/L Final  . Total Bilirubin 07/31/2017 0.7  0.3 - 1.2 mg/dL Final  . GFR calc non Af Amer 07/31/2017 53* >60 mL/min Final  . GFR calc Af Amer 07/31/2017 >60  >60 mL/min Final   Comment: (NOTE) The eGFR has been calculated using the CKD EPI equation. This calculation has not  been validated in all clinical situations. eGFR's persistently <60 mL/min signify possible Chronic Kidney Disease.   Georgiann Hahn gap 07/31/2017 7  5 - 15 Final   Performed at [facility], [address], [place], [place] [address]  . LDH 07/31/2017 177  98 - 192 U/L Final   Performed at [facility], [address] [place],  [address]     Pathology Orders Placed This Encounter  Procedures  . CBC with Differential/Platelet    Standing Status:   Future    Standing Expiration Date:   08/01/2018  . Comprehensive metabolic panel    Standing Status:   Future    Standing Expiration Date:   08/01/2018  . Lactate dehydrogenase    Standing Status:   Future    Standing Expiration Date:   08/01/2018  . CBC with Differential/Platelet    Standing Status:   Future    Standing Expiration Date:   08/01/2018  . Prepare Pheresed Platelets    Standing Status:   Standing    Number of Occurrences:   1    Order Specific Question:   Number of Apheresis Units    Answer:   1 unit (6-10 packs)    Order Specific Question:   Transfusion Indications    Answer:   Other (Specify)    Order Specific Question:   Date/Time blood product needed    Answer:   08/01/17       Zoila Shutter MD

## 2017-07-31 NOTE — Patient Instructions (Addendum)
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  We are going to be treating you with Vidaza (azacitidine).  This will be given with the intent of controlling your disease.  You will receive it through your port a cath. You will get treatment days 1-5 each week every 28 days.  You will see the doctor regularly throughout treatment.  We monitor your lab work prior to every treatment. The doctor monitors your response to treatment by the way you are feeling, your blood work, and scans periodically.  There will be wait times while you are here for treatment.  It will take about 30 minutes to 1 hour for your lab work to result.  Then there will be wait times while pharmacy mixes your medications.   You will receive the following premedications prior to treatment:   Zofran - nausea medication that you will take by mouth.   Azacitidine (Vidaza)  About This Drug Azacitidine is used to treat cancer. It is given in the vein (IV) or by a shot into your skin (subcutaneous injection).  Possible Side Effects . Bone marrow suppression. This is a decrease in the number of white blood cells, red blood cells, and platelets. This may raise your risk of infection, make you tired and weak (fatigue), and raise your risk of bleeding . Fever and chills . Nausea and vomiting (throwing up) . Constipation (not able to move bowels) . Diarrhea (loose bowel movements) . Decreased potassium . Weakness . Bruising . Skin and tissue irritation may involve redness, pain, warmth, or swelling at the injection site. Marland Kitchen Petechiae. Tiny red spots on the skin, often from low platelets. Note: Each of the side effects above was reported in 30% or greater of patients treated with azacitidine. Not all possible side effects are included above.  Warnings and Precautions . Risk of changes in your liver function if you have underlying liver disease . Changes in your kidney  function which can cause kidney failure and be life-threatening . Tumor lysis syndrome: This drug may act on the cancer cells very quickly. This may affect how your kidneys work.  Important Information . This drug may be present in the saliva, tears, sweat, urine, stool, vomit, semen, and vaginal secretions. Talk to your doctor and/or your nurse about the necessary precautions to take during this time.  Treating Side Effects . Manage tiredness by pacing your activities for the day. . Be sure to include periods of rest between energy-draining activities. . To help decrease the risk of infections, wash your hands regularly. . Avoid close contact with people who have a cold, the flu, or other infections. . To help decrease the risk of bleeding, use a soft toothbrush. Check with your nurse before using dental floss. . Be very careful when using knives or tools. . Use an electric shaver instead of a razor. . Drink plenty of fluids (a minimum of eight glasses per day is recommended). . If you throw up or have loose bowel movements, you should drink more fluids so that you do not become dehydrated (lack of water in the body from losing too much fluid). . To help with nausea and vomiting, eat small, frequent meals instead of three large meals a day. Choose foods and drinks that are at room temperature. Ask your nurse or doctor about other helpful tips and medicine that is available to help stop or lessen these symptoms. . If you have diarrhea, eat low-fiber foods  that are high in protein and calories and avoid foods that can irritate your digestive tracts or lead to cramping. . Ask your nurse or doctor about medicine that can lessen or stop your diarrhea. . Ask your doctor or nurse about medicines that are available to help stop or lessen constipation. . If you are not able to move your bowels, check with your doctor or nurse before you use enemas, laxatives, or suppositories.  Food and Drug  Interactions . There are no known interactions of azacitidine with food. . This drug may interact with other medicines. Tell your doctor and pharmacist about all the medicines and dietary supplements (vitamins, minerals, herbs and others) that you are taking at this time. Also, check with your doctor or pharmacist before starting any new prescription or over-thecounter medicines, or dietary supplements to make sure that there are no interactions.  When to Call the Doctor Call your doctor or nurse if you have any of these symptoms and/or any new or unusual symptoms: . Fever of 100.4 F (38 C) or higher . Chills . Tiredness that interferes with your daily activities . Feeling dizzy or lightheaded . Easy bleeding or bruising . Nausea that stops you from eating or drinking and/or is not relieved by prescribed medicines . Throwing up more than 3 times a day . Diarrhea, 4 times in one day or diarrhea with lack of strength or a feeling of being dizzy . No bowel movement in 3 days or when you feel uncomfortable . Pain, redness, or swelling at the site of the injection . Any new tiny red spots on the skin . Decreased or dark urine . Signs of possible liver problems: dark urine, pale bowel movements, bad stomach pain, feeling very tired and weak, unusual itching, or yellowing of the eyes or skin . Signs of tumor lysis: Confusion or agitation, decreased urine, nausea/vomiting, diarrhea, muscle cramping, numbness and/or tingling, seizures. . If you think you may be pregnant or may have impregnated your partner  Reproduction Warnings . Pregnancy warning: This drug can have harmful effects on the unborn baby. Women of childbearing potential and men with female partners of child bearing potential should use effective methods of birth control during your cancer treatment. Let your doctor know right away if you think you may be pregnant or may have impregnated your partner. . Breastfeeding warning:  It is not known if this drug passes into breast milk. Women should not breastfeed during treatment because this drug could enter the breast milk and cause harm to a breastfeeding baby. . Fertility warning: In men and women both, this drug may affect your ability to have children in the future. Talk with your doctor or nurse if you plan to have children. Ask for information on sperm or egg banking.  SELF CARE ACTIVITIES WHILE ON CHEMOTHERAPY:  Hydration Increase your fluid intake 48 hours prior to treatment and drink at least 8 to 12 cups (64 ounces) of water/decaffeinated beverages per day after treatment. You can still have your cup of coffee or soda but these beverages do not count as part of your 8 to 12 cups that you need to drink daily. No alcohol intake.  Medications Continue taking your normal prescription medication as prescribed.  If you start any new herbal or new supplements please let us know first to make sure it is safe.  Mouth Care Have teeth cleaned professionally before starting treatment. Keep dentures and partial plates clean. Use soft toothbrush and do not use  mouthwashes that contain alcohol. Biotene is a good mouthwash that is available at most pharmacies or may be ordered by calling [phone]. Use warm salt water gargles (1 teaspoon salt per 1 quart warm water) before and after meals and at bedtime. Or you may rinse with 2 tablespoons of three-percent hydrogen peroxide mixed in eight ounces of water. If you are still having problems with your mouth or sores in your mouth please call the clinic. If you need dental work, please let the doctor know before you go for your appointment so that we can coordinate the best possible time for you in regards to your chemo regimen. You need to also let your dentist know that you are actively taking chemo. We may need to do labs prior to your dental appointment.  Skin Care Always use sunscreen that has not expired and with SPF (Sun  Protection Factor) of 50 or higher. Wear hats to protect your head from the sun. Remember to use sunscreen on your hands, ears, face, & feet.  Use good moisturizing lotions such as udder cream, eucerin, or even Vaseline. Some chemotherapies can cause dry skin, color changes in your skin and nails.    . Avoid long, hot showers or baths. . Use gentle, fragrance-free soaps and laundry detergent. . Use moisturizers, preferably creams or ointments rather than lotions because the thicker consistency is better at preventing skin dehydration. Apply the cream or ointment within 15 minutes of showering. Reapply moisturizer at night, and moisturize your hands every time after you wash them.  Hair Loss (if your doctor says your hair will fall out)  . If your doctor says that your hair is likely to fall out, decide before you begin chemo whether you want to wear a wig. You may want to shop before treatment to match your hair color. . Hats, turbans, and scarves can also camouflage hair loss, although some people prefer to leave their heads uncovered. If you go bare-headed outdoors, be sure to use sunscreen on your scalp. . Cut your hair short. It eases the inconvenience of shedding lots of hair, but it also can reduce the emotional impact of watching your hair fall out. . Don't perm or color your hair during chemotherapy. Those chemical treatments are already damaging to hair and can enhance hair loss. Once your chemo treatments are done and your hair has grown back, it's OK to resume dyeing or perming hair. With chemotherapy, hair loss is almost always temporary. But when it grows back, it may be a different color or texture. In older adults who still had hair color before chemotherapy, the new growth may be completely gray.  Often, new hair is very fine and soft.  Infection Prevention Please wash your hands for at least 30 seconds using warm soapy water. Handwashing is the #1 way to prevent the spread of germs.  Stay away from sick people or people who are getting over a cold. If you develop respiratory systems such as green/yellow mucus production or productive cough or persistent cough let us know and we will see if you need an antibiotic. It is a good idea to keep a pair of gloves on when going into grocery stores/[employer] to decrease your risk of coming into contact with germs on the carts, etc. Carry alcohol hand gel with you at all times and use it frequently if out in public. If your temperature reaches 100.5 or higher please call the clinic and let us know.  If it is  after hours or on the weekend please go to the ER if your temperature is over 100.5.  Please have your own personal thermometer at home to use.    Sex and bodily fluids If you are going to have sex, a condom must be used to protect the person that isn't taking chemotherapy. Chemo can decrease your libido (sex drive). For a few days after chemotherapy, chemotherapy can be excreted through your bodily fluids.  When using the toilet please close the lid and flush the toilet twice.  Do this for a few day after you have had chemotherapy.   Effects of chemotherapy on your sex life Some changes are simple and won't last long. They won't affect your sex life permanently. Sometimes you may feel: . too tired . not strong enough to be very active . sick or sore  . not in the mood . anxious or low Your anxiety might not seem related to sex. For example, you may be worried about the cancer and how your treatment is going. Or you may be worried about money, or about how you family are coping with your illness. These things can cause stress, which can affect your interest in sex. It's important to talk to your partner about how you feel. Remember - the changes to your sex life don't usually last long. There's usually no medical reason to stop having sex during chemo. The drugs won't have any long term physical effects on your performance or enjoyment of  sex. Cancer can't be passed on to your partner during sex  Contraception It's important to use reliable contraception during treatment. Avoid getting pregnant while you or your partner are having chemotherapy. This is because the drugs may harm the baby. Sometimes chemotherapy drugs can leave a man or woman infertile.  This means you would not be able to have children in the future. You might want to talk to someone about permanent infertility. It can be very difficult to learn that you may no longer be able to have children. Some people find counselling helpful. There might be ways to preserve your fertility, although this is easier for men than for women. You may want to speak to a fertility expert. You can talk about sperm banking or harvesting your eggs. You can also ask about other fertility options, such as donor eggs. If you have or have had breast cancer, your doctor might advise you not to take the contraceptive pill. This is because the hormones in it might affect the cancer.  It is not known for sure whether or not chemotherapy drugs can be passed on through semen or secretions from the vagina. Because of this some doctors advise people to use a barrier method if you have sex during treatment. This applies to vaginal, anal or oral sex. Generally, doctors advise a barrier method only for the time you are actually having the treatment and for about a week after your treatment. Advice like this can be worrying, but this does not mean that you have to avoid being intimate with your partner. You can still have close contact with your partner and continue to enjoy sex.  Animals If you have cats or birds we just ask that you not change the litter or change the cage.  Please have someone else do this for you while you are on chemotherapy.   Food Safety During and After Cancer Treatment Food safety is important for people both during and after cancer treatment. Cancer and cancer treatments,  such as  chemotherapy, radiation therapy, and stem cell/bone marrow transplantation, often weaken the immune system. This makes it harder for your body to protect itself from foodborne illness, also called food poisoning. Foodborne illness is caused by eating food that contains harmful bacteria, parasites, or viruses.  Foods to avoid Some foods have a higher risk of becoming tainted with bacteria. These include: Marland Kitchen Unwashed fresh fruit and vegetables, especially leafy vegetables that can hide dirt and other contaminants . Raw sprouts, such as alfalfa sprouts . Raw or undercooked beef, especially ground beef, or other raw or undercooked meat and poultry . Fatty, fried, or spicy foods immediately before or after treatment.  These can sit heavy on your stomach and make you feel nauseous. . Raw or undercooked shellfish, such as oysters. . Sushi and sashimi, which often contain raw fish.  . Unpasteurized beverages, such as unpasteurized fruit juices, raw milk, raw yogurt, or cider . Undercooked eggs, such as soft boiled, over easy, and poached; raw, unpasteurized eggs; or foods made with raw egg, such as homemade raw cookie dough and homemade mayonnaise Simple steps for food safety Shop smart. . Do not buy food stored or displayed in an unclean area. . Do not buy bruised or damaged fruits or vegetables. . Do not buy cans that have cracks, dents, or bulges. . Pick up foods that can spoil at the end of your shopping trip and store them in a cooler on the way home. Prepare and clean up foods carefully. . Rinse all fresh fruits and vegetables under running water, and dry them with a clean towel or paper towel. . Clean the top of cans before opening them. . After preparing food, wash your hands for 20 seconds with hot water and soap. Pay special attention to areas between fingers and under nails. . Clean your utensils and dishes with hot water and soap. Marland Kitchen Disinfect your kitchen and cutting boards using 1 teaspoon  of liquid, unscented bleach mixed into 1 quart of water.   Dispose of old food. . Eat canned and packaged food before its expiration date (the "use by" or "best before" date). . Consume refrigerated leftovers within 3 to 4 days. After that time, throw out the food. Even if the food does not smell or look spoiled, it still may be unsafe. Some bacteria, such as Listeria, can grow even on foods stored in the refrigerator if they are kept for too long. Take precautions when eating out. . At restaurants, avoid buffets and salad bars where food sits out for a long time and comes in contact with many people. Food can become contaminated when someone with a virus, often a norovirus, or another "bug" handles it. . Put any leftover food in a "to-go" container yourself, rather than having the server do it. And, refrigerate leftovers as soon as you get home. . Choose restaurants that are clean and that are willing to prepare your food as you order it cooked. MEDICATIONS:  Compazine/Prochlorperazine 10mg  tablet. Take 1 tablet every 6 hours as needed for nausea/vomiting. (This can make you sleepy)   EMLA cream. Apply a quarter size amount to port site 1 hour prior to chemo. Do not rub in. Cover with plastic wrap.   Over-the-Counter Meds:  Miralax 1 capful in 8 oz of fluid daily. May increase to two times a day if needed. This is a stool softener. If this doesn't work proceed you can add:  Senokot S-start with 1 tablet two times a day and increase to 4 tablets two times a day if needed. (Total of 8 tablets in a 24 hour period). This is a stimulant laxative.   Call us if this does not help your bowels move.   Imodium 2mg  capsule. Take 2 capsules after the 1st loose stool and then 1 capsule every 2 hours until you go a total of 12 hours without having a  loose stool. Call the [facility] if loose stools continue. If diarrhea occurs @ bedtime, take 2 capsules @ bedtime. Then take 2 capsules every 4 hours until morning. Call [facility].    Diarrhea Sheet   If you are having loose stools/diarrhea, please purchase Imodium and begin taking as outlined:  At the first sign of poorly formed or loose stools you should begin taking Imodium (loperamide) 2 mg capsules.  Take two caplets (4mg ) followed by one caplet (2mg ) every 2 hours until you have had no diarrhea for 12 hours.  During the night take two caplets (4mg ) at bedtime and continue every 4 hours during the night until the morning.  Stop taking Imodium only after there is no sign of diarrhea for 12 hours.    Always call the [facility] if you are having loose stools/diarrhea that you can't get under control.  Loose stools/diarrhea leads to dehydration (loss of water) in your body.  We have other options of trying to get the loose stools/diarrhea to stop but you must let us know!    Constipation Sheet *Miralax in 8 oz. of fluid daily.  May increase to two times a day if needed.  This is a stool softener.  If this not enough to keep your bowel regular:  You can add:  *Senokot S, start with one tablet twice a day and can increase to 4 tablets twice a day if needed.  This is a stimulant laxative.   Sometimes when you take pain medication you need BOTH a medicine to keep your stool soft and a medicine to help your bowel push it out!  Please call if the above does not work for you.   Do not go more than 2 days without a bowel movement.  It is very important that you do not become constipated.  It will make you feel sick to your stomach (nausea) and can cause abdominal pain and vomiting.    Nausea Sheet   Compazine/Prochlorperazine 10mg  tablet. Take 1 tablet every 6 hours as needed for nausea/vomiting. (This can make you sleepy)  If you are having persistent nausea (nausea that does not  stop) please call the [facility] and let us know the amount of nausea that you are experiencing.  If you begin to vomit, you need to call the [facility] and if it is the weekend and you have vomited more than one time and can't get it to stop-go to the Emergency Room.  Persistent nausea/vomiting can lead to dehydration (loss of fluid in your body) and will make you  feel terrible.   Ice chips, sips of clear liquids, foods that are @ room temperature, crackers, and toast tend to be better tolerated.     SYMPTOMS TO REPORT AS SOON AS POSSIBLE AFTER TREATMENT:  FEVER GREATER THAN 100.5 F  CHILLS WITH OR WITHOUT FEVER  NAUSEA AND VOMITING THAT IS NOT CONTROLLED WITH YOUR NAUSEA MEDICATION  UNUSUAL SHORTNESS OF BREATH  UNUSUAL BRUISING OR BLEEDING  TENDERNESS IN MOUTH AND THROAT WITH OR WITHOUT PRESENCE OF ULCERS  URINARY PROBLEMS  BOWEL PROBLEMS  UNUSUAL RASH    Wear comfortable clothing and clothing appropriate for easy access to any Portacath or PICC line. Let us know if there is anything that we can do to make your therapy better!    What to do if you need assistance after hours or on the weekends: CALL [phone].  HOLD on the line, do not hang up.  You will hear multiple messages but at the end you will be connected with a nurse triage line.  They will contact the doctor if necessary.  Most of the time they will be able to assist you.  Do not call the hospital operator.      I have been informed and understand all of the instructions given to me and have received a copy. I have been instructed to call the clinic [phone] or my family physician as soon as possible for continued medical care, if indicated. I do not have any more questions at this time but understand that I may call the [facility] or the Patient Navigator at [phone] during office hours should I have questions or need assistance in obtaining follow-up care.

## 2017-07-31 NOTE — Progress Notes (Unsigned)
CRITICAL VALUE ALERT Critical value received:  Platelet count 23 Date of notification:  07-31-17 Time of notification: 9784 Critical value read back:  Yes.   Nurse who received alert:  Venita Lick LPN  MD notified (1st page):  Dr. Walden Field

## 2017-08-01 ENCOUNTER — Inpatient Hospital Stay (HOSPITAL_COMMUNITY): Payer: Medicare Other

## 2017-08-01 ENCOUNTER — Ambulatory Visit (HOSPITAL_COMMUNITY): Payer: Medicare Other

## 2017-08-01 ENCOUNTER — Other Ambulatory Visit: Payer: Self-pay

## 2017-08-01 ENCOUNTER — Encounter (HOSPITAL_COMMUNITY): Payer: Self-pay

## 2017-08-01 DIAGNOSIS — Z5111 Encounter for antineoplastic chemotherapy: Secondary | ICD-10-CM | POA: Diagnosis not present

## 2017-08-01 DIAGNOSIS — D46Z Other myelodysplastic syndromes: Secondary | ICD-10-CM

## 2017-08-01 DIAGNOSIS — D500 Iron deficiency anemia secondary to blood loss (chronic): Secondary | ICD-10-CM | POA: Diagnosis not present

## 2017-08-01 DIAGNOSIS — F17210 Nicotine dependence, cigarettes, uncomplicated: Secondary | ICD-10-CM | POA: Diagnosis not present

## 2017-08-01 DIAGNOSIS — E119 Type 2 diabetes mellitus without complications: Secondary | ICD-10-CM | POA: Diagnosis not present

## 2017-08-01 DIAGNOSIS — D469 Myelodysplastic syndrome, unspecified: Secondary | ICD-10-CM | POA: Diagnosis not present

## 2017-08-01 DIAGNOSIS — D696 Thrombocytopenia, unspecified: Secondary | ICD-10-CM | POA: Diagnosis not present

## 2017-08-01 MED ORDER — ACETAMINOPHEN 325 MG PO TABS
ORAL_TABLET | ORAL | Status: AC
  Filled 2017-08-01: qty 2

## 2017-08-01 MED ORDER — SODIUM CHLORIDE 0.9 % IV SOLN: 250.0000 mL | Freq: Once | INTRAVENOUS | Status: DC

## 2017-08-01 MED ORDER — DIPHENHYDRAMINE HCL 25 MG PO CAPS
25.0000 mg | ORAL_CAPSULE | Freq: Once | ORAL | Status: AC
  Administered 2017-08-01: 25 mg via ORAL

## 2017-08-01 MED ORDER — ACETAMINOPHEN 325 MG PO TABS
650.0000 mg | ORAL_TABLET | Freq: Once | ORAL | Status: AC
  Administered 2017-08-01: 650 mg via ORAL

## 2017-08-01 MED ORDER — DIPHENHYDRAMINE HCL 25 MG PO CAPS
ORAL_CAPSULE | ORAL | Status: AC
  Filled 2017-08-01: qty 1

## 2017-08-01 NOTE — Progress Notes (Signed)
Tolerated transfusion w/o adverse reaction. Alert, in no distress. VSS.  Discharged via wheelchair in c/o son.

## 2017-08-02 ENCOUNTER — Ambulatory Visit (HOSPITAL_COMMUNITY): Payer: Medicare Other

## 2017-08-02 ENCOUNTER — Inpatient Hospital Stay (HOSPITAL_COMMUNITY): Payer: Medicare Other

## 2017-08-02 LAB — PREPARE PLATELET PHERESIS: UNIT DIVISION: 0

## 2017-08-02 LAB — BPAM PLATELET PHERESIS
Blood Product Expiration Date: 201907162359
ISSUE DATE / TIME: 201907161023
Unit Type and Rh: 9500

## 2017-08-03 ENCOUNTER — Other Ambulatory Visit (HOSPITAL_COMMUNITY): Payer: Medicare Other

## 2017-08-03 ENCOUNTER — Inpatient Hospital Stay (HOSPITAL_COMMUNITY): Payer: Medicare Other

## 2017-08-03 ENCOUNTER — Ambulatory Visit (HOSPITAL_COMMUNITY): Payer: Medicare Other

## 2017-08-03 DIAGNOSIS — D696 Thrombocytopenia, unspecified: Secondary | ICD-10-CM

## 2017-08-03 DIAGNOSIS — D469 Myelodysplastic syndrome, unspecified: Secondary | ICD-10-CM | POA: Diagnosis not present

## 2017-08-03 DIAGNOSIS — E119 Type 2 diabetes mellitus without complications: Secondary | ICD-10-CM | POA: Diagnosis not present

## 2017-08-03 DIAGNOSIS — D500 Iron deficiency anemia secondary to blood loss (chronic): Secondary | ICD-10-CM | POA: Diagnosis not present

## 2017-08-03 DIAGNOSIS — Z5111 Encounter for antineoplastic chemotherapy: Secondary | ICD-10-CM | POA: Diagnosis not present

## 2017-08-03 DIAGNOSIS — F17210 Nicotine dependence, cigarettes, uncomplicated: Secondary | ICD-10-CM | POA: Diagnosis not present

## 2017-08-03 LAB — COMPREHENSIVE METABOLIC PANEL
ALT: 12 U/L (ref 0–44)
ANION GAP: 8 (ref 5–15)
AST: 17 U/L (ref 15–41)
Albumin: 3.5 g/dL (ref 3.5–5.0)
Alkaline Phosphatase: 104 U/L (ref 38–126)
BUN: 18 mg/dL (ref 8–23)
CHLORIDE: 100 mmol/L (ref 98–111)
CO2: 25 mmol/L (ref 22–32)
Calcium: 8.8 mg/dL — ABNORMAL LOW (ref 8.9–10.3)
Creatinine, Ser: 1.06 mg/dL — ABNORMAL HIGH (ref 0.44–1.00)
GFR calc Af Amer: 58 mL/min — ABNORMAL LOW (ref >60)
GFR calc non Af Amer: 50 mL/min — ABNORMAL LOW (ref >60)
GLUCOSE: 174 mg/dL — AB (ref 70–99)
POTASSIUM: 4.5 mmol/L (ref 3.5–5.1)
SODIUM: 133 mmol/L — AB (ref 135–145)
Total Bilirubin: 0.8 mg/dL (ref 0.3–1.2)
Total Protein: 7.6 g/dL (ref 6.5–8.1)

## 2017-08-03 LAB — CBC WITH DIFFERENTIAL/PLATELET
BASOS ABS: 0 10*3/uL (ref 0.0–0.1)
BASOS PCT: 0 %
Eosinophils Absolute: 0.1 10*3/uL (ref 0.0–0.7)
Eosinophils Relative: 2 %
HCT: 24.5 % — ABNORMAL LOW (ref 36.0–46.0)
Hemoglobin: 7.8 g/dL — ABNORMAL LOW (ref 12.0–15.0)
LYMPHS PCT: 22 %
Lymphs Abs: 1 10*3/uL (ref 0.7–4.0)
MCH: 32.6 pg (ref 26.0–34.0)
MCHC: 31.8 g/dL (ref 30.0–36.0)
MCV: 102.5 fL — ABNORMAL HIGH (ref 78.0–100.0)
MONO ABS: 0.7 10*3/uL (ref 0.1–1.0)
MONOS PCT: 15 %
NEUTROS PCT: 61 %
Neutro Abs: 2.7 10*3/uL (ref 1.7–7.7)
Platelets: 19 10*3/uL — CL (ref 150–400)
RBC: 2.39 MIL/uL — ABNORMAL LOW (ref 3.87–5.11)
RDW: 19.3 % — AB (ref 11.5–15.5)
WBC: 4.5 10*3/uL (ref 4.0–10.5)

## 2017-08-03 LAB — LACTATE DEHYDROGENASE: LDH: 171 U/L (ref 98–192)

## 2017-08-03 NOTE — Progress Notes (Unsigned)
CRITICAL VALUE ALERT  Critical Value:  Platelets 19  Date & Time Notied:  08/03/17 at 1243  Provider Notified: Dr. Walden Field  Orders Received/Actions taken: n/a

## 2017-08-04 ENCOUNTER — Inpatient Hospital Stay (HOSPITAL_COMMUNITY): Payer: Medicare Other

## 2017-08-04 ENCOUNTER — Ambulatory Visit (HOSPITAL_COMMUNITY): Payer: Medicare Other

## 2017-08-04 DIAGNOSIS — Z6823 Body mass index (BMI) 23.0-23.9, adult: Secondary | ICD-10-CM | POA: Diagnosis not present

## 2017-08-04 DIAGNOSIS — Z1389 Encounter for screening for other disorder: Secondary | ICD-10-CM | POA: Diagnosis not present

## 2017-08-04 DIAGNOSIS — Z0001 Encounter for general adult medical examination with abnormal findings: Secondary | ICD-10-CM | POA: Diagnosis not present

## 2017-08-07 ENCOUNTER — Ambulatory Visit (HOSPITAL_COMMUNITY): Payer: Medicare Other | Admitting: Internal Medicine

## 2017-08-07 ENCOUNTER — Inpatient Hospital Stay (HOSPITAL_COMMUNITY): Payer: Medicare Other

## 2017-08-07 VITALS — BP 115/68 | HR 68 | Temp 97.8°F | Resp 18 | Ht 63.0 in | Wt 136.0 lb

## 2017-08-07 DIAGNOSIS — D469 Myelodysplastic syndrome, unspecified: Secondary | ICD-10-CM | POA: Diagnosis not present

## 2017-08-07 DIAGNOSIS — D46Z Other myelodysplastic syndromes: Secondary | ICD-10-CM

## 2017-08-07 DIAGNOSIS — Z5111 Encounter for antineoplastic chemotherapy: Secondary | ICD-10-CM | POA: Diagnosis not present

## 2017-08-07 DIAGNOSIS — R110 Nausea: Secondary | ICD-10-CM

## 2017-08-07 DIAGNOSIS — F17210 Nicotine dependence, cigarettes, uncomplicated: Secondary | ICD-10-CM | POA: Diagnosis not present

## 2017-08-07 DIAGNOSIS — D696 Thrombocytopenia, unspecified: Secondary | ICD-10-CM | POA: Diagnosis not present

## 2017-08-07 DIAGNOSIS — D500 Iron deficiency anemia secondary to blood loss (chronic): Secondary | ICD-10-CM | POA: Diagnosis not present

## 2017-08-07 DIAGNOSIS — E119 Type 2 diabetes mellitus without complications: Secondary | ICD-10-CM | POA: Diagnosis not present

## 2017-08-07 LAB — CBC WITH DIFFERENTIAL/PLATELET
BASOS ABS: 0 10*3/uL (ref 0.0–0.1)
BASOS PCT: 0 %
EOS ABS: 0.1 10*3/uL (ref 0.0–0.7)
EOS PCT: 1 %
HCT: 26.7 % — ABNORMAL LOW (ref 36.0–46.0)
Hemoglobin: 8.4 g/dL — ABNORMAL LOW (ref 12.0–15.0)
Lymphocytes Relative: 37 %
Lymphs Abs: 2.8 10*3/uL (ref 0.7–4.0)
MCH: 32.6 pg (ref 26.0–34.0)
MCHC: 31.5 g/dL (ref 30.0–36.0)
MCV: 103.5 fL — ABNORMAL HIGH (ref 78.0–100.0)
Monocytes Absolute: 1.1 10*3/uL — ABNORMAL HIGH (ref 0.1–1.0)
Monocytes Relative: 14 %
Neutro Abs: 3.6 10*3/uL (ref 1.7–7.7)
Neutrophils Relative %: 48 %
PLATELETS: 25 10*3/uL — AB (ref 150–400)
RBC: 2.58 MIL/uL — ABNORMAL LOW (ref 3.87–5.11)
RDW: 19.3 % — AB (ref 11.5–15.5)
WBC: 7.7 10*3/uL (ref 4.0–10.5)

## 2017-08-07 LAB — COMPREHENSIVE METABOLIC PANEL
ALT: 12 U/L (ref 0–44)
AST: 21 U/L (ref 15–41)
Albumin: 3.6 g/dL (ref 3.5–5.0)
Alkaline Phosphatase: 98 U/L (ref 38–126)
Anion gap: 8 (ref 5–15)
BUN: 36 mg/dL — ABNORMAL HIGH (ref 8–23)
CO2: 26 mmol/L (ref 22–32)
Calcium: 9 mg/dL (ref 8.9–10.3)
Chloride: 101 mmol/L (ref 98–111)
Creatinine, Ser: 1.44 mg/dL — ABNORMAL HIGH (ref 0.44–1.00)
GFR calc non Af Amer: 34 mL/min — ABNORMAL LOW (ref >60)
GFR, EST AFRICAN AMERICAN: 40 mL/min — AB (ref >60)
Glucose, Bld: 162 mg/dL — ABNORMAL HIGH (ref 70–99)
Potassium: 4.3 mmol/L (ref 3.5–5.1)
Sodium: 135 mmol/L (ref 135–145)
Total Bilirubin: 0.8 mg/dL (ref 0.3–1.2)
Total Protein: 7.6 g/dL (ref 6.5–8.1)

## 2017-08-07 LAB — LACTATE DEHYDROGENASE: LDH: 175 U/L (ref 98–192)

## 2017-08-07 MED ORDER — PROMETHAZINE HCL 25 MG/ML IJ SOLN: 25.0000 mg | Freq: Once | INTRAMUSCULAR | Status: DC

## 2017-08-07 MED ORDER — ONDANSETRON HCL 8 MG PO TABS: 8.0000 mg | ORAL_TABLET | Freq: Three times a day (TID) | ORAL | 0 refills | Status: AC | PRN

## 2017-08-07 MED ORDER — SODIUM CHLORIDE 0.9 % IV SOLN
Freq: Once | INTRAVENOUS | Status: AC
  Administered 2017-08-07: 500 mL via INTRAVENOUS

## 2017-08-07 MED ORDER — SODIUM CHLORIDE 0.9% FLUSH
10.0000 mL | INTRAVENOUS | Status: DC | PRN
  Administered 2017-08-07: 10 mL
  Filled 2017-08-07: qty 10

## 2017-08-07 MED ORDER — SODIUM CHLORIDE 0.9 % IV SOLN
25.0000 mg | Freq: Once | INTRAVENOUS | Status: AC
  Administered 2017-08-07: 25 mg via INTRAVENOUS
  Filled 2017-08-07: qty 1

## 2017-08-07 MED ORDER — ONDANSETRON 8 MG PO TBDP
8.0000 mg | ORAL_TABLET | Freq: Once | ORAL | Status: AC
  Administered 2017-08-07: 8 mg via ORAL
  Filled 2017-08-07: qty 1

## 2017-08-07 MED ORDER — SODIUM CHLORIDE 0.9 % IV SOLN
75.0000 mg/m2 | Freq: Once | INTRAVENOUS | Status: AC
  Administered 2017-08-07: 125 mg via INTRAVENOUS
  Filled 2017-08-07: qty 12.5

## 2017-08-07 NOTE — Progress Notes (Signed)
CRITICAL VALUE ALERT  Critical Value:  Platelets 25,000  Date & Time Notied:  08/07/17 at 28  Provider Notified: Dr. Walden Field  Orders Received/Actions taken: n/a

## 2017-08-07 NOTE — Patient Instructions (Signed)
[facility] Discharge Instructions for Patients Receiving Chemotherapy  Today you received the following chemotherapy agents vidaza.    If you develop nausea and vomiting that is not controlled by your nausea medication, call the clinic.   BELOW ARE SYMPTOMS THAT SHOULD BE REPORTED IMMEDIATELY:  *FEVER GREATER THAN 100.5 F  *CHILLS WITH OR WITHOUT FEVER  NAUSEA AND VOMITING THAT IS NOT CONTROLLED WITH YOUR NAUSEA MEDICATION  *UNUSUAL SHORTNESS OF BREATH  *UNUSUAL BRUISING OR BLEEDING  TENDERNESS IN MOUTH AND THROAT WITH OR WITHOUT PRESENCE OF ULCERS  *URINARY PROBLEMS  *BOWEL PROBLEMS  UNUSUAL RASH Items with * indicate a potential emergency and should be followed up as soon as possible.  Feel free to call the clinic should you have any questions or concerns. The clinic phone number is [phone].  Please show the [facility] at check-in to the Emergency Department and triage nurse.

## 2017-08-07 NOTE — Progress Notes (Signed)
Patient to treatment room for chemotherapy.  Stated diarrhea today and nausea with no vomiting.  Reviewed platelets 25 today and patients symptoms with verbal order to give Pnenergan 25mg  IV with zofran and ok to treat today.  Family at side with no s/s of distress noted.  Reviewed chemotherapy teaching with consent signed last week.  All questions asked and answered.  Family at bedside for teaching with no questions asked.  Left arm IV intact with no complaints of pain at site.  Patient refused tegaderm or paper tape requesting only coban to be used.  Reviewed with family how to manage if the IV gets dislodged with understanding verbalized.    Patient tolerated chemotherapy with no complaints voiced.  Stated nausea was better.  Prescription for zofran sent to [facility] verbal order Dr. Walden Field.  Good blood return noted before and after administration of chemotherapy.  Patient still insists no tape or tegaderm over peripheral IV site. Only wants coban.  Peripheral IV site above  Coban clean and dry with no new bruising or swelling noted.  Denied pain above coban.  Patient discharged by wheelchair with son with no s/s of distress noted.

## 2017-08-08 ENCOUNTER — Other Ambulatory Visit (HOSPITAL_COMMUNITY): Payer: Self-pay

## 2017-08-08 ENCOUNTER — Inpatient Hospital Stay (HOSPITAL_COMMUNITY): Payer: Medicare Other

## 2017-08-08 VITALS — BP 113/37 | HR 83 | Temp 98.5°F | Resp 18

## 2017-08-08 DIAGNOSIS — D696 Thrombocytopenia, unspecified: Secondary | ICD-10-CM

## 2017-08-08 DIAGNOSIS — D500 Iron deficiency anemia secondary to blood loss (chronic): Secondary | ICD-10-CM | POA: Diagnosis not present

## 2017-08-08 DIAGNOSIS — E119 Type 2 diabetes mellitus without complications: Secondary | ICD-10-CM | POA: Diagnosis not present

## 2017-08-08 DIAGNOSIS — F17210 Nicotine dependence, cigarettes, uncomplicated: Secondary | ICD-10-CM | POA: Diagnosis not present

## 2017-08-08 DIAGNOSIS — D46Z Other myelodysplastic syndromes: Secondary | ICD-10-CM

## 2017-08-08 DIAGNOSIS — Z5111 Encounter for antineoplastic chemotherapy: Secondary | ICD-10-CM | POA: Diagnosis not present

## 2017-08-08 DIAGNOSIS — D469 Myelodysplastic syndrome, unspecified: Secondary | ICD-10-CM | POA: Diagnosis not present

## 2017-08-08 MED ORDER — ONDANSETRON 8 MG PO TBDP
ORAL_TABLET | ORAL | Status: AC
  Filled 2017-08-08: qty 1

## 2017-08-08 MED ORDER — SODIUM CHLORIDE 0.9 % IV SOLN: Freq: Once | INTRAVENOUS | Status: DC

## 2017-08-08 MED ORDER — ONDANSETRON 8 MG PO TBDP
8.0000 mg | ORAL_TABLET | Freq: Once | ORAL | Status: AC
  Administered 2017-08-08: 8 mg via ORAL

## 2017-08-08 MED ORDER — SODIUM CHLORIDE 0.9 % IV SOLN
75.0000 mg/m2 | Freq: Once | INTRAVENOUS | Status: AC
  Administered 2017-08-08: 125 mg via INTRAVENOUS
  Filled 2017-08-08: qty 12.5

## 2017-08-09 ENCOUNTER — Inpatient Hospital Stay (HOSPITAL_COMMUNITY): Payer: Medicare Other

## 2017-08-09 ENCOUNTER — Encounter (HOSPITAL_COMMUNITY): Payer: Self-pay | Admitting: Internal Medicine

## 2017-08-09 ENCOUNTER — Encounter (HOSPITAL_COMMUNITY): Payer: Self-pay

## 2017-08-09 VITALS — BP 112/38 | HR 88 | Temp 97.8°F | Resp 18

## 2017-08-09 DIAGNOSIS — D469 Myelodysplastic syndrome, unspecified: Secondary | ICD-10-CM | POA: Diagnosis not present

## 2017-08-09 DIAGNOSIS — F17210 Nicotine dependence, cigarettes, uncomplicated: Secondary | ICD-10-CM | POA: Diagnosis not present

## 2017-08-09 DIAGNOSIS — D46Z Other myelodysplastic syndromes: Secondary | ICD-10-CM

## 2017-08-09 DIAGNOSIS — E119 Type 2 diabetes mellitus without complications: Secondary | ICD-10-CM | POA: Diagnosis not present

## 2017-08-09 DIAGNOSIS — D696 Thrombocytopenia, unspecified: Secondary | ICD-10-CM | POA: Diagnosis not present

## 2017-08-09 DIAGNOSIS — D500 Iron deficiency anemia secondary to blood loss (chronic): Secondary | ICD-10-CM | POA: Diagnosis not present

## 2017-08-09 DIAGNOSIS — Z5111 Encounter for antineoplastic chemotherapy: Secondary | ICD-10-CM | POA: Diagnosis not present

## 2017-08-09 MED ORDER — AZACITIDINE CHEMO INJECTION 100 MG
75.0000 mg/m2 | Freq: Once | INTRAMUSCULAR | Status: AC
  Administered 2017-08-09: 125 mg via INTRAVENOUS
  Filled 2017-08-09: qty 12.5

## 2017-08-09 MED ORDER — SODIUM CHLORIDE 0.9 % IV SOLN
Freq: Once | INTRAVENOUS | Status: AC
  Administered 2017-08-09: 500 mL via INTRAVENOUS

## 2017-08-09 MED ORDER — ONDANSETRON 8 MG PO TBDP
8.0000 mg | ORAL_TABLET | Freq: Once | ORAL | Status: AC
  Administered 2017-08-09: 8 mg via ORAL

## 2017-08-09 MED ORDER — SODIUM CHLORIDE 0.9% FLUSH
10.0000 mL | INTRAVENOUS | Status: DC | PRN
  Administered 2017-08-09: 10 mL
  Filled 2017-08-09: qty 10

## 2017-08-09 MED ORDER — ONDANSETRON 8 MG PO TBDP
ORAL_TABLET | ORAL | Status: AC
  Filled 2017-08-09: qty 1

## 2017-08-09 NOTE — Progress Notes (Signed)
Patient to treatment room for vidaza.  Left peripheral IV site clean and dry with no new bruising noted above coban dressing.  Good blood return noted with flush.  Patient stated her left arm was a "little" tender and sore above the coban dressing.  Denied pain.  Left hand below the coban with small amount of "puffiness" on thumb side of hand with good capillary refill.  Denied numbness or tingling in fingers.  No s/s of distress noted.  Family at side.   Patient tolerated chemotherapy with no complaints voiced.  Good blood return noted before and after administration of chemotherapy.  Peripheral IV discontinued with no bruising, redness, drainage, tenderness, or complaints of pain at insertion site.  Cotton ball and coban used for dressing.  Patient instructed when resting to rest her arm on her chest for elevation with understanding verbalized.  VSS with discharge and left by wheelchair with family.  No s/s of distress noted.

## 2017-08-09 NOTE — Patient Instructions (Signed)
[facility] Discharge Instructions for Patients Receiving Chemotherapy  Today you received the following chemotherapy agents vidaza.    If you develop nausea and vomiting that is not controlled by your nausea medication, call the clinic.   BELOW ARE SYMPTOMS THAT SHOULD BE REPORTED IMMEDIATELY:  *FEVER GREATER THAN 100.5 F  *CHILLS WITH OR WITHOUT FEVER  NAUSEA AND VOMITING THAT IS NOT CONTROLLED WITH YOUR NAUSEA MEDICATION  *UNUSUAL SHORTNESS OF BREATH  *UNUSUAL BRUISING OR BLEEDING  TENDERNESS IN MOUTH AND THROAT WITH OR WITHOUT PRESENCE OF ULCERS  *URINARY PROBLEMS  *BOWEL PROBLEMS  UNUSUAL RASH Items with * indicate a potential emergency and should be followed up as soon as possible.  Feel free to call the clinic should you have any questions or concerns. The clinic phone number is [phone].  Please show the [facility] at check-in to the Emergency Department and triage nurse.

## 2017-08-10 ENCOUNTER — Other Ambulatory Visit (HOSPITAL_COMMUNITY): Payer: Medicare Other

## 2017-08-10 ENCOUNTER — Inpatient Hospital Stay (HOSPITAL_COMMUNITY): Payer: Medicare Other

## 2017-08-10 VITALS — BP 121/37 | HR 76 | Temp 97.7°F | Resp 20

## 2017-08-10 DIAGNOSIS — E119 Type 2 diabetes mellitus without complications: Secondary | ICD-10-CM | POA: Diagnosis not present

## 2017-08-10 DIAGNOSIS — D46Z Other myelodysplastic syndromes: Secondary | ICD-10-CM

## 2017-08-10 DIAGNOSIS — Z5111 Encounter for antineoplastic chemotherapy: Secondary | ICD-10-CM | POA: Diagnosis not present

## 2017-08-10 DIAGNOSIS — D696 Thrombocytopenia, unspecified: Secondary | ICD-10-CM | POA: Diagnosis not present

## 2017-08-10 DIAGNOSIS — D469 Myelodysplastic syndrome, unspecified: Secondary | ICD-10-CM | POA: Diagnosis not present

## 2017-08-10 DIAGNOSIS — F17210 Nicotine dependence, cigarettes, uncomplicated: Secondary | ICD-10-CM | POA: Diagnosis not present

## 2017-08-10 DIAGNOSIS — D500 Iron deficiency anemia secondary to blood loss (chronic): Secondary | ICD-10-CM | POA: Diagnosis not present

## 2017-08-10 LAB — COMPREHENSIVE METABOLIC PANEL
ALBUMIN: 3.3 g/dL — AB (ref 3.5–5.0)
ALK PHOS: 104 U/L (ref 38–126)
ALT: 13 U/L (ref 0–44)
ANION GAP: 6 (ref 5–15)
AST: 17 U/L (ref 15–41)
BUN: 26 mg/dL — ABNORMAL HIGH (ref 8–23)
CO2: 27 mmol/L (ref 22–32)
Calcium: 9 mg/dL (ref 8.9–10.3)
Chloride: 103 mmol/L (ref 98–111)
Creatinine, Ser: 1.34 mg/dL — ABNORMAL HIGH (ref 0.44–1.00)
GFR calc Af Amer: 43 mL/min — ABNORMAL LOW (ref >60)
GFR calc non Af Amer: 37 mL/min — ABNORMAL LOW (ref >60)
GLUCOSE: 139 mg/dL — AB (ref 70–99)
Potassium: 5.2 mmol/L — ABNORMAL HIGH (ref 3.5–5.1)
Sodium: 136 mmol/L (ref 135–145)
Total Bilirubin: 0.7 mg/dL (ref 0.3–1.2)
Total Protein: 7.3 g/dL (ref 6.5–8.1)

## 2017-08-10 LAB — CBC WITH DIFFERENTIAL/PLATELET
Basophils Absolute: 0 10*3/uL (ref 0.0–0.1)
Basophils Relative: 0 %
Eosinophils Absolute: 0.1 10*3/uL (ref 0.0–0.7)
Eosinophils Relative: 2 %
HCT: 23.2 % — ABNORMAL LOW (ref 36.0–46.0)
HEMOGLOBIN: 7.2 g/dL — AB (ref 12.0–15.0)
LYMPHS PCT: 30 %
Lymphs Abs: 1.1 10*3/uL (ref 0.7–4.0)
MCH: 32.6 pg (ref 26.0–34.0)
MCHC: 31 g/dL (ref 30.0–36.0)
MCV: 105 fL — ABNORMAL HIGH (ref 78.0–100.0)
Monocytes Absolute: 0.6 10*3/uL (ref 0.1–1.0)
Monocytes Relative: 16 %
Neutro Abs: 2 10*3/uL (ref 1.7–7.7)
Neutrophils Relative %: 52 %
Platelets: 16 10*3/uL — CL (ref 150–400)
RBC: 2.21 MIL/uL — ABNORMAL LOW (ref 3.87–5.11)
RDW: 19 % — ABNORMAL HIGH (ref 11.5–15.5)
WBC: 3.8 10*3/uL — ABNORMAL LOW (ref 4.0–10.5)

## 2017-08-10 LAB — LACTATE DEHYDROGENASE: LDH: 155 U/L (ref 98–192)

## 2017-08-10 MED ORDER — SODIUM CHLORIDE 0.9 % IV SOLN
Freq: Once | INTRAVENOUS | Status: AC
  Administered 2017-08-10: 13:00:00 via INTRAVENOUS

## 2017-08-10 MED ORDER — ONDANSETRON 8 MG PO TBDP
8.0000 mg | ORAL_TABLET | Freq: Once | ORAL | Status: AC
  Administered 2017-08-10: 8 mg via ORAL

## 2017-08-10 MED ORDER — AZACITIDINE CHEMO INJECTION 100 MG
75.0000 mg/m2 | Freq: Once | INTRAMUSCULAR | Status: AC
  Administered 2017-08-10: 125 mg via INTRAVENOUS
  Filled 2017-08-10: qty 12.5

## 2017-08-10 MED ORDER — ONDANSETRON 8 MG PO TBDP
ORAL_TABLET | ORAL | Status: AC
  Filled 2017-08-10: qty 1

## 2017-08-10 NOTE — Progress Notes (Signed)
Tolerated infusion w/o adverse reaction.  Alert, in no distress.  Discharged via wheelchair in c/o son.

## 2017-08-10 NOTE — Progress Notes (Signed)
CRITICAL VALUE ALERT Critical value received:  Platelets-16 Date of notification:  08/10/17 Time of notification: 7680 Critical value read back:  Yes.   Nurse who received alert:  M.Zeina Akkerman,LPN MD notified (1st page):  V.Higgs, MD

## 2017-08-11 ENCOUNTER — Inpatient Hospital Stay (HOSPITAL_COMMUNITY): Payer: Medicare Other

## 2017-08-11 ENCOUNTER — Other Ambulatory Visit (HOSPITAL_COMMUNITY): Payer: Medicare Other

## 2017-08-11 VITALS — BP 98/36 | HR 73 | Temp 98.0°F | Resp 18

## 2017-08-11 DIAGNOSIS — D469 Myelodysplastic syndrome, unspecified: Secondary | ICD-10-CM | POA: Diagnosis not present

## 2017-08-11 DIAGNOSIS — Z5111 Encounter for antineoplastic chemotherapy: Secondary | ICD-10-CM | POA: Diagnosis not present

## 2017-08-11 DIAGNOSIS — D46Z Other myelodysplastic syndromes: Secondary | ICD-10-CM

## 2017-08-11 DIAGNOSIS — E119 Type 2 diabetes mellitus without complications: Secondary | ICD-10-CM | POA: Diagnosis not present

## 2017-08-11 DIAGNOSIS — D500 Iron deficiency anemia secondary to blood loss (chronic): Secondary | ICD-10-CM | POA: Diagnosis not present

## 2017-08-11 DIAGNOSIS — F17210 Nicotine dependence, cigarettes, uncomplicated: Secondary | ICD-10-CM | POA: Diagnosis not present

## 2017-08-11 DIAGNOSIS — D696 Thrombocytopenia, unspecified: Secondary | ICD-10-CM | POA: Diagnosis not present

## 2017-08-11 MED ORDER — SODIUM CHLORIDE 0.9 % IV SOLN
Freq: Once | INTRAVENOUS | Status: AC
Start: 2017-08-11 — End: 2017-08-11
  Administered 2017-08-11: 10:00:00 via INTRAVENOUS

## 2017-08-11 MED ORDER — ONDANSETRON 8 MG PO TBDP
ORAL_TABLET | ORAL | Status: AC
  Filled 2017-08-11: qty 1

## 2017-08-11 MED ORDER — ONDANSETRON 8 MG PO TBDP
8.0000 mg | ORAL_TABLET | Freq: Once | ORAL | Status: AC
  Administered 2017-08-11: 8 mg via ORAL
  Filled 2017-08-11: qty 1

## 2017-08-11 MED ORDER — OXYCODONE-ACETAMINOPHEN 5-325 MG PO TABS: 1.0000 | ORAL_TABLET | Freq: Once | ORAL | Status: DC

## 2017-08-11 MED ORDER — SODIUM CHLORIDE 0.9 % IV SOLN
75.0000 mg/m2 | Freq: Once | INTRAVENOUS | Status: AC
  Administered 2017-08-11: 125 mg via INTRAVENOUS
  Filled 2017-08-11: qty 12.5

## 2017-08-11 NOTE — Progress Notes (Signed)
Pt reports burning at IV site to left forearm.  IV site d/c'd and pressure dsg applied to site. New IV obtained to left proximal posterior forearm; good blood return present and flushes w/o difficulty. No c/o pain, no s/s infiltration at site.   Tolerated infusion w/o adverse reaction.  Alert, in no distress.  VSS.  Discharged via wheelchair in c/o son.

## 2017-08-14 ENCOUNTER — Ambulatory Visit (HOSPITAL_COMMUNITY): Payer: Medicare Other | Admitting: Internal Medicine

## 2017-08-14 ENCOUNTER — Other Ambulatory Visit (HOSPITAL_COMMUNITY): Payer: Medicare Other

## 2017-08-15 ENCOUNTER — Encounter (HOSPITAL_COMMUNITY): Payer: Self-pay | Admitting: General Practice

## 2017-08-15 NOTE — Progress Notes (Signed)
[facility] CSW Progress Note  Call to patient to follow up and assess for needs/resources after first treatment, no answer, VM not personally identified.  Left generic VM requesting call back if desired.  Edwyna Shell, LCSW Clinical Social Worker Phone:  [phone]

## 2017-08-17 DIAGNOSIS — Z66 Do not resuscitate: Secondary | ICD-10-CM

## 2017-08-17 DIAGNOSIS — Z515 Encounter for palliative care: Secondary | ICD-10-CM

## 2017-08-17 HISTORY — DX: Do not resuscitate: Z66

## 2017-08-17 HISTORY — DX: Encounter for palliative care: Z51.5

## 2017-08-18 ENCOUNTER — Other Ambulatory Visit: Payer: Self-pay

## 2017-08-18 ENCOUNTER — Encounter (HOSPITAL_COMMUNITY): Payer: Self-pay

## 2017-08-18 ENCOUNTER — Other Ambulatory Visit (HOSPITAL_COMMUNITY): Payer: Medicare Other

## 2017-08-18 ENCOUNTER — Emergency Department (HOSPITAL_COMMUNITY): Payer: Medicare Other

## 2017-08-18 ENCOUNTER — Encounter (HOSPITAL_COMMUNITY): Payer: Self-pay | Admitting: *Deleted

## 2017-08-18 ENCOUNTER — Inpatient Hospital Stay (HOSPITAL_COMMUNITY): Payer: Medicare Other

## 2017-08-18 ENCOUNTER — Inpatient Hospital Stay (HOSPITAL_COMMUNITY)
Admission: RE | Admit: 2017-08-18 | Discharge: 2017-08-29 | DRG: 377 | Disposition: A | Discharge status: Hospice | Payer: Medicare Other | Attending: Nephrology | Admitting: Nephrology

## 2017-08-18 DIAGNOSIS — R110 Nausea: Secondary | ICD-10-CM | POA: Diagnosis not present

## 2017-08-18 DIAGNOSIS — R531 Weakness: Secondary | ICD-10-CM

## 2017-08-18 DIAGNOSIS — R5381 Other malaise: Secondary | ICD-10-CM | POA: Diagnosis not present

## 2017-08-18 DIAGNOSIS — M329 Systemic lupus erythematosus, unspecified: Secondary | ICD-10-CM | POA: Diagnosis present

## 2017-08-18 DIAGNOSIS — D649 Anemia, unspecified: Secondary | ICD-10-CM | POA: Diagnosis not present

## 2017-08-18 DIAGNOSIS — Z7189 Other specified counseling: Secondary | ICD-10-CM | POA: Diagnosis not present

## 2017-08-18 DIAGNOSIS — D46Z Other myelodysplastic syndromes: Secondary | ICD-10-CM | POA: Diagnosis present

## 2017-08-18 DIAGNOSIS — Z7982 Long term (current) use of aspirin: Secondary | ICD-10-CM

## 2017-08-18 DIAGNOSIS — D6182 Myelophthisis: Secondary | ICD-10-CM

## 2017-08-18 DIAGNOSIS — K5900 Constipation, unspecified: Secondary | ICD-10-CM | POA: Diagnosis present

## 2017-08-18 DIAGNOSIS — Z66 Do not resuscitate: Secondary | ICD-10-CM | POA: Diagnosis present

## 2017-08-18 DIAGNOSIS — E861 Hypovolemia: Secondary | ICD-10-CM | POA: Diagnosis present

## 2017-08-18 DIAGNOSIS — N390 Urinary tract infection, site not specified: Secondary | ICD-10-CM | POA: Diagnosis present

## 2017-08-18 DIAGNOSIS — E871 Hypo-osmolality and hyponatremia: Secondary | ICD-10-CM | POA: Diagnosis present

## 2017-08-18 DIAGNOSIS — I959 Hypotension, unspecified: Secondary | ICD-10-CM | POA: Diagnosis present

## 2017-08-18 DIAGNOSIS — E1142 Type 2 diabetes mellitus with diabetic polyneuropathy: Secondary | ICD-10-CM | POA: Diagnosis present

## 2017-08-18 DIAGNOSIS — R0603 Acute respiratory distress: Secondary | ICD-10-CM | POA: Diagnosis not present

## 2017-08-18 DIAGNOSIS — N289 Disorder of kidney and ureter, unspecified: Secondary | ICD-10-CM | POA: Diagnosis not present

## 2017-08-18 DIAGNOSIS — K7200 Acute and subacute hepatic failure without coma: Secondary | ICD-10-CM | POA: Diagnosis present

## 2017-08-18 DIAGNOSIS — Z452 Encounter for adjustment and management of vascular access device: Secondary | ICD-10-CM | POA: Diagnosis not present

## 2017-08-18 DIAGNOSIS — K922 Gastrointestinal hemorrhage, unspecified: Secondary | ICD-10-CM

## 2017-08-18 DIAGNOSIS — I10 Essential (primary) hypertension: Secondary | ICD-10-CM | POA: Diagnosis present

## 2017-08-18 DIAGNOSIS — Z515 Encounter for palliative care: Secondary | ICD-10-CM | POA: Diagnosis not present

## 2017-08-18 DIAGNOSIS — R945 Abnormal results of liver function studies: Secondary | ICD-10-CM | POA: Diagnosis not present

## 2017-08-18 DIAGNOSIS — M069 Rheumatoid arthritis, unspecified: Secondary | ICD-10-CM | POA: Diagnosis present

## 2017-08-18 DIAGNOSIS — F17210 Nicotine dependence, cigarettes, uncomplicated: Secondary | ICD-10-CM | POA: Diagnosis present

## 2017-08-18 DIAGNOSIS — K920 Hematemesis: Principal | ICD-10-CM | POA: Diagnosis present

## 2017-08-18 DIAGNOSIS — D469 Myelodysplastic syndrome, unspecified: Secondary | ICD-10-CM

## 2017-08-18 DIAGNOSIS — R7989 Other specified abnormal findings of blood chemistry: Secondary | ICD-10-CM

## 2017-08-18 DIAGNOSIS — D61818 Other pancytopenia: Secondary | ICD-10-CM | POA: Diagnosis present

## 2017-08-18 DIAGNOSIS — R948 Abnormal results of function studies of other organs and systems: Secondary | ICD-10-CM | POA: Diagnosis not present

## 2017-08-18 DIAGNOSIS — F17200 Nicotine dependence, unspecified, uncomplicated: Secondary | ICD-10-CM | POA: Diagnosis not present

## 2017-08-18 DIAGNOSIS — D696 Thrombocytopenia, unspecified: Secondary | ICD-10-CM | POA: Diagnosis not present

## 2017-08-18 DIAGNOSIS — Z85118 Personal history of other malignant neoplasm of bronchus and lung: Secondary | ICD-10-CM | POA: Diagnosis not present

## 2017-08-18 DIAGNOSIS — D638 Anemia in other chronic diseases classified elsewhere: Secondary | ICD-10-CM | POA: Diagnosis present

## 2017-08-18 DIAGNOSIS — R918 Other nonspecific abnormal finding of lung field: Secondary | ICD-10-CM | POA: Diagnosis not present

## 2017-08-18 DIAGNOSIS — D62 Acute posthemorrhagic anemia: Secondary | ICD-10-CM | POA: Diagnosis present

## 2017-08-18 DIAGNOSIS — Z7984 Long term (current) use of oral hypoglycemic drugs: Secondary | ICD-10-CM

## 2017-08-18 DIAGNOSIS — Z888 Allergy status to other drugs, medicaments and biological substances status: Secondary | ICD-10-CM | POA: Diagnosis not present

## 2017-08-18 DIAGNOSIS — N179 Acute kidney failure, unspecified: Secondary | ICD-10-CM | POA: Diagnosis not present

## 2017-08-18 DIAGNOSIS — M797 Fibromyalgia: Secondary | ICD-10-CM | POA: Diagnosis present

## 2017-08-18 DIAGNOSIS — R1031 Right lower quadrant pain: Secondary | ICD-10-CM | POA: Diagnosis not present

## 2017-08-18 DIAGNOSIS — D500 Iron deficiency anemia secondary to blood loss (chronic): Secondary | ICD-10-CM

## 2017-08-18 DIAGNOSIS — I472 Ventricular tachycardia: Secondary | ICD-10-CM | POA: Diagnosis present

## 2017-08-18 DIAGNOSIS — I499 Cardiac arrhythmia, unspecified: Secondary | ICD-10-CM | POA: Diagnosis not present

## 2017-08-18 LAB — URINALYSIS, ROUTINE W REFLEX MICROSCOPIC
Bilirubin Urine: NEGATIVE
Glucose, UA: NEGATIVE mg/dL
Hgb urine dipstick: NEGATIVE
KETONES UR: NEGATIVE mg/dL
Nitrite: NEGATIVE
PH: 5 (ref 5.0–8.0)
PROTEIN: NEGATIVE mg/dL
Specific Gravity, Urine: 1.014 (ref 1.005–1.030)

## 2017-08-18 LAB — CBC WITH DIFFERENTIAL/PLATELET
ABS IMMATURE GRANULOCYTES: 0.1 10*3/uL (ref 0.0–0.1)
Basophils Absolute: 0 10*3/uL (ref 0.0–0.1)
Basophils Relative: 0 %
EOS PCT: 0 %
Eosinophils Absolute: 0 10*3/uL (ref 0.0–0.7)
HCT: 13.7 % — ABNORMAL LOW (ref 36.0–46.0)
HEMOGLOBIN: 4.3 g/dL — AB (ref 12.0–15.0)
Immature Granulocytes: 1 %
LYMPHS PCT: 16 %
Lymphs Abs: 0.8 10*3/uL (ref 0.7–4.0)
MCH: 33.1 pg (ref 26.0–34.0)
MCHC: 31.4 g/dL (ref 30.0–36.0)
MCV: 105.4 fL — ABNORMAL HIGH (ref 78.0–100.0)
MONO ABS: 0.5 10*3/uL (ref 0.1–1.0)
Monocytes Relative: 10 %
NEUTROS ABS: 3.6 10*3/uL (ref 1.7–7.7)
Neutrophils Relative %: 73 %
Platelets: 34 10*3/uL — ABNORMAL LOW (ref 150–400)
RBC: 1.3 MIL/uL — AB (ref 3.87–5.11)
RDW: 19.6 % — ABNORMAL HIGH (ref 11.5–15.5)
WBC: 4.9 10*3/uL (ref 4.0–10.5)

## 2017-08-18 LAB — RETICULOCYTES
RBC.: 1.3 MIL/uL — ABNORMAL LOW (ref 3.87–5.11)
RETIC COUNT ABSOLUTE: 49.4 10*3/uL (ref 19.0–186.0)
Retic Ct Pct: 3.8 % — ABNORMAL HIGH (ref 0.4–3.1)

## 2017-08-18 LAB — COMPREHENSIVE METABOLIC PANEL
ALBUMIN: 3.1 g/dL — AB (ref 3.5–5.0)
ALBUMIN: 2.7 g/dL — AB (ref 3.5–5.0)
ALK PHOS: 76 U/L (ref 38–126)
ALK PHOS: 69 U/L (ref 38–126)
ALT: 299 U/L — ABNORMAL HIGH (ref 0–44)
ALT: 599 U/L — AB (ref 0–44)
ANION GAP: 15 (ref 5–15)
ANION GAP: 14 (ref 5–15)
AST: 404 U/L — ABNORMAL HIGH (ref 15–41)
AST: 773 U/L — ABNORMAL HIGH (ref 15–41)
BILIRUBIN TOTAL: 1 mg/dL (ref 0.3–1.2)
BUN: 72 mg/dL — ABNORMAL HIGH (ref 8–23)
BUN: 70 mg/dL — AB (ref 8–23)
CALCIUM: 8.5 mg/dL — AB (ref 8.9–10.3)
CALCIUM: 8.4 mg/dL — AB (ref 8.9–10.3)
CHLORIDE: 95 mmol/L — AB (ref 98–111)
CO2: 23 mmol/L (ref 22–32)
CO2: 22 mmol/L (ref 22–32)
CREATININE: 2.41 mg/dL — AB (ref 0.44–1.00)
Chloride: 99 mmol/L (ref 98–111)
Creatinine, Ser: 2.39 mg/dL — ABNORMAL HIGH (ref 0.44–1.00)
GFR calc Af Amer: 21 mL/min — ABNORMAL LOW (ref >60)
GFR calc non Af Amer: 19 mL/min — ABNORMAL LOW (ref >60)
GFR calc non Af Amer: 18 mL/min — ABNORMAL LOW (ref >60)
GFR, EST AFRICAN AMERICAN: 22 mL/min — AB (ref >60)
GLUCOSE: 153 mg/dL — AB (ref 70–99)
GLUCOSE: 172 mg/dL — AB (ref 70–99)
POTASSIUM: 5.4 mmol/L — AB (ref 3.5–5.1)
Potassium: 5 mmol/L (ref 3.5–5.1)
SODIUM: 133 mmol/L — AB (ref 135–145)
SODIUM: 135 mmol/L (ref 135–145)
TOTAL PROTEIN: 5.5 g/dL — AB (ref 6.5–8.1)
Total Bilirubin: 1 mg/dL (ref 0.3–1.2)
Total Protein: 6.6 g/dL (ref 6.5–8.1)

## 2017-08-18 LAB — MRSA PCR SCREENING: MRSA by PCR: NEGATIVE

## 2017-08-18 LAB — GLUCOSE, CAPILLARY
GLUCOSE-CAPILLARY: 155 mg/dL — AB (ref 70–99)
Glucose-Capillary: 138 mg/dL — ABNORMAL HIGH (ref 70–99)
Glucose-Capillary: 140 mg/dL — ABNORMAL HIGH (ref 70–99)

## 2017-08-18 LAB — HEMOGLOBIN AND HEMATOCRIT, BLOOD
HEMATOCRIT: 13.7 % — AB (ref 36.0–46.0)
Hemoglobin: 4.2 g/dL — CL (ref 12.0–15.0)

## 2017-08-18 LAB — FERRITIN: Ferritin: 2030 ng/mL — ABNORMAL HIGH (ref 11–307)

## 2017-08-18 LAB — PREPARE RBC (CROSSMATCH)

## 2017-08-18 LAB — ABO/RH: ABO/RH(D): O POS

## 2017-08-18 LAB — TROPONIN I: Troponin I: 0.26 ng/mL (ref <0.03)

## 2017-08-18 LAB — CBC
HCT: 12.5 % — ABNORMAL LOW (ref 36.0–46.0)
HEMOGLOBIN: 3.9 g/dL — AB (ref 12.0–15.0)
MCH: 33.3 pg (ref 26.0–34.0)
MCHC: 31.2 g/dL (ref 30.0–36.0)
MCV: 106.8 fL — ABNORMAL HIGH (ref 78.0–100.0)
PLATELETS: 18 10*3/uL — AB (ref 150–400)
RBC: 1.17 MIL/uL — ABNORMAL LOW (ref 3.87–5.11)
RDW: 21 % — AB (ref 11.5–15.5)
WBC: 5.8 10*3/uL (ref 4.0–10.5)

## 2017-08-18 LAB — IRON AND TIBC
Iron: 232 ug/dL — ABNORMAL HIGH (ref 28–170)
SATURATION RATIOS: 86 % — AB (ref 10.4–31.8)
TIBC: 269 ug/dL (ref 250–450)
UIBC: 37 ug/dL

## 2017-08-18 LAB — LACTIC ACID, PLASMA: Lactic Acid, Venous: 2.4 mmol/L (ref 0.5–1.9)

## 2017-08-18 LAB — APTT: aPTT: 32 seconds (ref 24–36)

## 2017-08-18 LAB — SAVE SMEAR

## 2017-08-18 LAB — PROTIME-INR
INR: 1.8
Prothrombin Time: 20.7 seconds — ABNORMAL HIGH (ref 11.4–15.2)

## 2017-08-18 LAB — LACTATE DEHYDROGENASE: LDH: 1302 U/L — ABNORMAL HIGH (ref 98–192)

## 2017-08-18 MED ORDER — ONDANSETRON HCL 4 MG/2ML IJ SOLN
INTRAMUSCULAR | Status: AC
  Filled 2017-08-18: qty 2

## 2017-08-18 MED ORDER — ACETAMINOPHEN 325 MG PO TABS: 650.0000 mg | ORAL_TABLET | Freq: Four times a day (QID) | ORAL | Status: DC | PRN

## 2017-08-18 MED ORDER — ONDANSETRON HCL 4 MG/2ML IJ SOLN
4.0000 mg | Freq: Four times a day (QID) | INTRAMUSCULAR | Status: DC | PRN
  Administered 2017-08-21: 4 mg via INTRAVENOUS
  Filled 2017-08-18: qty 2

## 2017-08-18 MED ORDER — FOLIC ACID 5 MG/ML IJ SOLN
2.0000 mg | Freq: Once | INTRAMUSCULAR | Status: AC
  Administered 2017-08-18: 2 mg via INTRAVENOUS
  Filled 2017-08-18: qty 0.4

## 2017-08-18 MED ORDER — ONDANSETRON HCL 4 MG/2ML IJ SOLN
4.0000 mg | Freq: Once | INTRAMUSCULAR | Status: AC
  Administered 2017-08-18: 4 mg via INTRAVENOUS
  Filled 2017-08-18: qty 2

## 2017-08-18 MED ORDER — INSULIN ASPART 100 UNIT/ML ~~LOC~~ SOLN
0.0000 [IU] | SUBCUTANEOUS | Status: DC
  Administered 2017-08-18: 2 [IU] via SUBCUTANEOUS
  Administered 2017-08-19: 1 [IU] via SUBCUTANEOUS

## 2017-08-18 MED ORDER — PROSIGHT PO TABS
1.0000 | ORAL_TABLET | Freq: Every day | ORAL | Status: DC
  Administered 2017-08-20 – 2017-08-29 (×10): 1 via ORAL
  Filled 2017-08-18 (×11): qty 1

## 2017-08-18 MED ORDER — PANTOPRAZOLE SODIUM 40 MG IV SOLR: 40.0000 mg | Freq: Two times a day (BID) | INTRAVENOUS | Status: DC

## 2017-08-18 MED ORDER — SODIUM CHLORIDE 0.9 % IV SOLN: 250.0000 mL | INTRAVENOUS | Status: DC | PRN

## 2017-08-18 MED ORDER — FOLIC ACID 1 MG PO TABS: 1.0000 mg | ORAL_TABLET | Freq: Every day | ORAL | Status: DC

## 2017-08-18 MED ORDER — PANTOPRAZOLE SODIUM 40 MG IV SOLR
40.0000 mg | Freq: Two times a day (BID) | INTRAVENOUS | Status: DC
  Administered 2017-08-19 – 2017-08-21 (×5): 40 mg via INTRAVENOUS
  Filled 2017-08-18 (×5): qty 40

## 2017-08-18 MED ORDER — SODIUM CHLORIDE 0.9 % IV SOLN
80.0000 mg | Freq: Once | INTRAVENOUS | Status: AC
  Administered 2017-08-18: 80 mg via INTRAVENOUS
  Filled 2017-08-18: qty 80

## 2017-08-18 MED ORDER — ONDANSETRON HCL 4 MG/2ML IJ SOLN
4.0000 mg | Freq: Once | INTRAMUSCULAR | Status: AC
Start: 2017-08-18 — End: 2017-08-18
  Administered 2017-08-18: 4 mg via INTRAVENOUS

## 2017-08-18 MED ORDER — LACTATED RINGERS IV BOLUS
1000.0000 mL | Freq: Once | INTRAVENOUS | Status: AC
  Administered 2017-08-18: 1000 mL via INTRAVENOUS

## 2017-08-18 MED ORDER — FOLIC ACID 1 MG PO TABS: 2.0000 mg | ORAL_TABLET | Freq: Every day | ORAL | Status: DC

## 2017-08-18 MED ORDER — PRESERVISION AREDS 2 PO CAPS: 2.0000 | ORAL_CAPSULE | Freq: Two times a day (BID) | ORAL | Status: DC

## 2017-08-18 MED ORDER — SODIUM CHLORIDE 0.9% IV SOLUTION
Freq: Once | INTRAVENOUS | Status: AC
  Administered 2017-08-18: 17:00:00 via INTRAVENOUS

## 2017-08-18 MED ORDER — SODIUM CHLORIDE 0.9% IV SOLUTION
Freq: Once | INTRAVENOUS | Status: AC
  Administered 2017-08-18: 21:00:00 via INTRAVENOUS

## 2017-08-18 MED ORDER — SODIUM CHLORIDE 0.9 % IV SOLN
INTRAVENOUS | Status: DC
  Administered 2017-08-18: 21:00:00 via INTRAVENOUS

## 2017-08-18 MED ORDER — SODIUM CHLORIDE 0.9 % IV SOLN
1.0000 g | Freq: Once | INTRAVENOUS | Status: AC
  Administered 2017-08-18: 1 g via INTRAVENOUS
  Filled 2017-08-18: qty 10

## 2017-08-18 MED ORDER — SODIUM CHLORIDE 0.9 % IV SOLN
8.0000 mg/h | INTRAVENOUS | Status: DC
  Administered 2017-08-18: 8 mg/h via INTRAVENOUS
  Filled 2017-08-18 (×8): qty 80

## 2017-08-18 NOTE — Progress Notes (Signed)
Attempted to get report twice from [facility]. Bartholomew Crews, RN 08/18/2017 5:39 PM

## 2017-08-18 NOTE — ED Triage Notes (Signed)
Pt has just received chemo x 5 days. Pt reports that she has been weak , decreased appetite for "a while now". 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 Family spoke to oncology and instructed to come to ED

## 2017-08-18 NOTE — ED Provider Notes (Signed)
[facility] Emergency Department Provider Note MRN:  [id number]  Arrival date & time: 08/18/17     Chief Complaint   Weakness   History of Present Illness   [name] is a 77 y.o. year-old female with a history of newly diagnosed high-grade myelodysplastic syndrome presenting to the ED with chief complaint of malaise and fatigue.  Today patient has been experiencing severe generalized weakness, too weak to eat, significant nausea, and per family is not acting like her normal self.  Recently started chemo for her new diagnosis of MDS.  Patient denies fevers, no headache, no chest pain or shortness of breath, no abdominal pain.  No rashes no dysuria.  Symptoms are constant, moderate to severe in severity.  No exacerbating or alleviating factors.  Review of Systems  A complete 10 system review of systems was obtained and all systems are negative except as noted in the HPI and PMH.   Patient's Health History    Past Medical History:  Diagnosis Date  . Diabetes mellitus    x 20 yrs  . Fibromyalgia   . Fibromyalgia   . Lung cancer ([place]) 01/28/2015   left upper lobe lung /right upper lobe lung  . Lupus ([place])    sle  . Peripheral neuropathy   . RA (rheumatoid arthritis) ([place])    ra  . Thrombocytopenia, unspecified ([place]) 09/04/2013  . Tobacco use     Past Surgical History:  Procedure Laterality Date  . ABDOMINAL HYSTERECTOMY    . APPENDECTOMY    . CATARACT EXTRACTION W/ INTRAOCULAR LENS IMPLANT  2010   right eye  . CESAREAN SECTION  x3  . COLONOSCOPY N/A 05/05/2015   Procedure: COLONOSCOPY;  Surgeon: Danie Binder, MD;  Location: [facility] ENDO SUITE;  Service: Endoscopy;  Laterality: N/A;  . KNEE ARTHROSCOPY  yrs ago   left knee  . left arm surgery     multiple surgeries on  . OOPHORECTOMY    . SHOULDER HEMI-ARTHROPLASTY  01/27/2011   Procedure: SHOULDER HEMI-ARTHROPLASTY;  Surgeon: Nita Sells, MD;  Location: [facility] ORS;  Service: Orthopedics;   Laterality: Right;  interscaline right shoulder  . TRIGGER FINGER RELEASE  yrs ago   right hand    Family History  Family history unknown: Yes    Social History   Socioeconomic History  . Marital status: Widowed    Spouse name: Not on file  . Number of children: Not on file  . Years of education: Not on file  . Highest education level: Not on file  Occupational History  . Not on file  Social Needs  . Financial resource strain: Not on file  . Food insecurity:    Worry: Not on file    Inability: Not on file  . Transportation needs:    Medical: Not on file    Non-medical: Not on file  Tobacco Use  . Smoking status: Current Every Day Smoker    Packs/day: 1.50    Years: 40.00    Pack years: 60.00    Types: Cigarettes  . Smokeless tobacco: Never Used  . Tobacco comment: 2 packs a day x 30 yrs  Substance and Sexual Activity  . Alcohol use: No    Alcohol/week: 0.0 oz  . Drug use: No  . Sexual activity: Not Currently  Lifestyle  . Physical activity:    Days per week: Not on file    Minutes per session: Not on file  . Stress: Not on file  Relationships  . Social connections:    Talks on phone: Not on file    Gets together: Not on file    Attends religious service: Not on file    Active member of club or organization: Not on file    Attends meetings of clubs or organizations: Not on file    Relationship status: Not on file  . Intimate partner violence:    Fear of current or ex partner: Not on file    Emotionally abused: Not on file    Physically abused: Not on file    Forced sexual activity: Not on file  Other Topics Concern  . Not on file  Social History Narrative  . Not on file     Physical Exam  Vital Signs and Nursing Notes reviewed Vitals:   08/18/17 2100 08/18/17 2200  BP: (!) 102/55 (!) 98/40  Pulse: 93 96  Resp: (!) 21 (!) 25  Temp:    SpO2: 100% 99%    CONSTITUTIONAL: Well-appearing, NAD, appears uncomfortable NEURO:  Alert and oriented x 3, no  focal deficits EYES:  eyes equal and reactive ENT/NECK:  no LAD, no JVD CARDIO: Regular rate, well-perfused, normal S1 and S2 PULM:  CTAB no wheezing or rhonchi GI/GU:  normal bowel sounds, non-distended, non-tender MSK/SPINE:  No gross deformities, no edema SKIN:  no rash, atraumatic PSYCH:  Appropriate speech and behavior  Diagnostic and Interventional Summary    EKG Interpretation  Date/Time:  Friday August 18 2017 14:28:24 EDT Ventricular Rate:  91 PR Interval:    QRS Duration: 91 QT Interval:  411 QTC Calculation: 506 R Axis:   79 Text Interpretation:  Sinus rhythm Low voltage, extremity and precordial leads Anteroseptal infarct, old Repol abnrm suggests ischemia, lateral leads Prolonged QT interval Confirmed by Gerlene Fee [phone]) on 08/18/2017 2:37:28 PM      Labs Reviewed  CBC - Abnormal; Notable for the following components:      Result Value   RBC 1.17 (*)    Hemoglobin 3.9 (*)    HCT 12.5 (*)    MCV 106.8 (*)    RDW 21.0 (*)    Platelets 18 (*)    All other components within normal limits  COMPREHENSIVE METABOLIC PANEL - Abnormal; Notable for the following components:   Sodium 133 (*)    Potassium 5.4 (*)    Chloride 95 (*)    Glucose, Bld 153 (*)    BUN 72 (*)    Creatinine, Ser 2.39 (*)    Calcium 8.5 (*)    Albumin 3.1 (*)    AST 404 (*)    ALT 299 (*)    GFR calc non Af Amer 19 (*)    GFR calc Af Amer 22 (*)    All other components within normal limits  PROTIME-INR - Abnormal; Notable for the following components:   Prothrombin Time 20.7 (*)    All other components within normal limits  GLUCOSE, CAPILLARY - Abnormal; Notable for the following components:   Glucose-Capillary 140 (*)    All other components within normal limits  GLUCOSE, CAPILLARY - Abnormal; Notable for the following components:   Glucose-Capillary 155 (*)    All other components within normal limits  HEMOGLOBIN AND HEMATOCRIT, BLOOD - Abnormal; Notable for the following  components:   Hemoglobin 4.2 (*)    HCT 13.7 (*)    All other components within normal limits  URINALYSIS, ROUTINE W REFLEX MICROSCOPIC - Abnormal; Notable for the following components:  APPearance HAZY (*)    Leukocytes, UA MODERATE (*)    Bacteria, UA RARE (*)    All other components within normal limits  MRSA PCR SCREENING  CULTURE, BLOOD (ROUTINE X 2)  CULTURE, BLOOD (ROUTINE X 2)  APTT  URINALYSIS, ROUTINE W REFLEX MICROSCOPIC  CBC  BASIC METABOLIC PANEL  MAGNESIUM  PHOSPHORUS  ERYTHROPOIETIN  RETICULOCYTES  LACTATE DEHYDROGENASE  IRON AND TIBC  FERRITIN  SAVE SMEAR  CBC WITH DIFFERENTIAL/PLATELET  COMPREHENSIVE METABOLIC PANEL  LACTIC ACID, PLASMA  LACTIC ACID, PLASMA  TROPONIN I  TYPE AND SCREEN  PREPARE RBC (CROSSMATCH)  PREPARE PLATELET PHERESIS  TYPE AND SCREEN  PREPARE RBC (CROSSMATCH)    DG Chest 2 View  Final Result      Medications  0.9 %  sodium chloride infusion (has no administration in time range)  0.9 %  sodium chloride infusion ( Intravenous New Bag/Given 08/18/17 2119)  acetaminophen (TYLENOL) tablet 650 mg (has no administration in time range)  ondansetron (ZOFRAN) injection 4 mg (has no administration in time range)  pantoprazole (PROTONIX) injection 40 mg (has no administration in time range)  insulin aspart (novoLOG) injection 0-9 Units (2 Units Subcutaneous Given 08/18/17 2200)  multivitamin (PROSIGHT) tablet 1 tablet (has no administration in time range)  folic acid 2 mg in sodium chloride 0.9 % 50 mL IVPB (2 mg Intravenous New Bag/Given 08/18/17 2159)  lactated ringers bolus 1,000 mL (0 mLs Intravenous Stopped 08/18/17 1616)  ondansetron (ZOFRAN) injection 4 mg (4 mg Intravenous Given 08/18/17 1421)  0.9 %  sodium chloride infusion (Manually program via Guardrails IV Fluids) ( Intravenous Transfusing/Transfer 08/18/17 1745)  pantoprazole (PROTONIX) 80 mg in sodium chloride 0.9 % 100 mL IVPB (0 mg Intravenous Stopped 08/18/17 1753)  calcium gluconate  1 g in sodium chloride 0.9 % 100 mL IVPB (0 g Intravenous Stopped 08/18/17 1649)  ondansetron (ZOFRAN) injection 4 mg (4 mg Intravenous Given 08/18/17 1549)  0.9 %  sodium chloride infusion (Manually program via Guardrails IV Fluids) ( Intravenous New Bag/Given 08/18/17 2120)     Procedures Critical Care Critical Care Documentation Critical care time provided by me (excluding procedures): 55 minutes  Condition necessitating critical care: critical anemia, concern for active GIB  Components of critical care management: reviewing of prior records, laboratory and imaging interpretation, frequent re-examination and reassessment of vital signs, administration of IVF resuscitation, blood/platelet transfusion, IV calcium, IV protonix, discussion with consultants.   ED Course and Medical Decision Making  I have reviewed the triage vital signs and the nursing notes.  Pertinent labs & imaging results that were available during my care of the patient were reviewed by me and considered in my medical decision making (see below for details). Clinical Course as of Aug 18 2204  Fri Aug 18, 4128  5832 77 year old female here with generalized weakness, malaise, report of not acting like her normal self at home.  New diagnosis of MDS, recently starting chemotherapy.  Considering side effects of chemotherapy, metabolic disarray, UTI, pneumonia.  Abdomen is soft and nontender, patient has no focal neurological deficits.  Somnolent but wakes to voice, conversant, difficult to understand and unclear if confused or not, no family at bedside currently we will recheck soon.   [MB]  1500 Labs revealed critically low hemoglobin 3.9, platelets 18.  Patient had episode of vomiting with question of coffee-ground emesis.  Unclear if GI bleed versus complication of MDS or chemotherapy.  Will cover empirically with Protonix drip, transfuse 3 units RBCs, 1 unit platelets.  Will admit to ICU.   [MB]  1501 Calcium low prior to RBC  administration, will replete with 1 g calcium gluconate.   [MB]  1512 Spoke with blood bank, RBCs will be bedside within 30 minutes.  Platelets are not kept in house, will take an hour to be shipped here from [facility].   [MB]  0981 Currently with two 20-gauge peripheral IVs, will obtain third peripheral, large-bore.   [MB]    Clinical Course User Index [MB] Maudie Flakes, MD    Admitted to intensive care unit, transported to [facility].  Barth Kirks. [place]onia Small, [place] [email or web address]  Final Clinical Impressions(s) / ED Diagnoses     ICD-10-CM   1. Acute blood loss anemia D62   2. Weakness R53.1 DG Chest 2 View    DG Chest 2 View  3. Upper GI bleed K92.2   4. MDS (myelodysplastic syndrome) (HCC) D46.9   5. Hypocalcemia E83.51   6. Thrombocytopenia [facility]) D69.6     ED Discharge Orders    None         Maudie Flakes, MD 08/18/17 2207

## 2017-08-18 NOTE — ED Notes (Signed)
Platelet administration started at 1642 due to pump issues.

## 2017-08-18 NOTE — ED Notes (Signed)
Increased platelet infusion to 537ml/ hr.

## 2017-08-18 NOTE — H&P (Addendum)
PULMONARY / CRITICAL CARE MEDICINE   Name: [name] MRN: [id number] DOB: [birth year]    ADMISSION DATE:  08/18/2017 CONSULTATION DATE:  08/18/17  REFERRING MD:  Dr. Sedonia Watson / ER at [place]:  Malaise, Fatigue   HISTORY OF PRESENT ILLNESS:   77 y/o F, smoker (2ppd, started at age 33) who presented to the [facility] ER on 8/2 with reports of malaise and fatigue.  She was recently diagnosed with high-grade myelodysplastic syndrome and treated with Vidaza (azacitidine). Also carries a medical history of lupus, DM, RA on methotrexate, smoking, fibromyalgia & thrombocytopenia and lung cancer s/p radiotherapy per Dr. Lisbeth Watson to R/L hypermetabolic pulmonary nodules with follow up PET scan (06/26/17) that did not show hypermetabolic activity at the treated sites or metastatic disease.    The patient reported on admit that she had been experiencing weakness, decreased appetite / too weak to eat, nausea and family reported she was "not acting like herself".  She had at least two episodes of vomiting at home and two in the ER.  Her last chemotherapy was on 08/11/17.  Initial ER work up notable for SBP of 89, afebrile.  Labs showed profound anemia with a Hgb to 3.9 and platelets of 18.  Additionally, she had one episode of vomiting with question of coffee ground emesis. She was treated with 1 unit PRBC (typed for 3) and 1 unit of platelets.    The patient was transferred to [facility] for further evaluation.   PAST MEDICAL HISTORY :  She  has a past medical history of Diabetes mellitus, Fibromyalgia, Fibromyalgia, Lung cancer ([place]) (01/28/2015), Lupus ([place]), Peripheral neuropathy, RA (rheumatoid arthritis) ([place]), Thrombocytopenia, unspecified ([place]) (09/04/2013), and Tobacco use.  PAST SURGICAL HISTORY: She  has a past surgical history that includes Appendectomy; Cesarean section (x3); Oophorectomy; Cataract extraction w/ intraocular lens implant (2010); Abdominal hysterectomy; left arm surgery; Trigger finger  release (yrs ago); Knee arthroscopy (yrs ago); Shoulder hemi-arthroplasty (01/27/2011); and Colonoscopy (N/A, 05/05/2015).  Allergies  Allergen Reactions  . Bupropion Nausea Only  . Lopid  [Gemfibrozil] Nausea Only    Current Facility-Administered Medications on File Prior to Encounter  Medication  . 0.9 %  sodium chloride infusion   Current Outpatient Medications on File Prior to Encounter  Medication Sig  . albuterol (PROVENTIL HFA;VENTOLIN HFA) 108 (90 BASE) MCG/ACT inhaler Inhale 2 puffs into the lungs every 6 (six) hours as needed for wheezing or shortness of breath. Please instruct in usage.  Marland Kitchen aspirin EC 81 MG tablet Take 1 tablet (81 mg total) by mouth daily. Restart 4/21  . folic acid (FOLVITE) 1 MG tablet Take 1 mg by mouth daily.  Marland Kitchen glimepiride (AMARYL) 4 MG tablet Take 4 mg by mouth daily with breakfast.   . glipiZIDE-metformin (METAGLIP) 5-500 MG tablet Take 1 tablet by mouth 2 (two) times daily before a meal.   . lisinopril (PRINIVIL,ZESTRIL) 2.5 MG tablet Take 2.5 mg by mouth daily.   . methotrexate 2.5 MG tablet Take 2.5 mg by mouth See admin instructions. Take 3 tablets on Tuesdays and 2 tablets on Wednesday.  . metoprolol tartrate (LOPRESSOR) 25 MG tablet Take 25 mg by mouth 2 (two) times daily. Reported on 07/28/2015  . naproxen sodium (ALEVE) 220 MG tablet Take by mouth.  . traMADol (ULTRAM) 50 MG tablet Take 50 mg by mouth every 6 (six) hours as needed.  . Multiple Vitamins-Minerals (PRESERVISION AREDS 2) CAPS Take 2 capsules by mouth 2 (two) times daily.  . ondansetron (ZOFRAN) 8  MG tablet Take 1 tablet (8 mg total) by mouth every 8 (eight) hours as needed for nausea or vomiting.    FAMILY HISTORY:  Her Family history is unknown by patient.  SOCIAL HISTORY: She  reports that she has been smoking cigarettes.  She has a 60.00 pack-year smoking history. She has never used smokeless tobacco. She reports that she does not drink alcohol or use drugs.  REVIEW OF SYSTEMS:   POSITIVES IN BOLD Gen: Denies fever, chills, weight change, fatigue, weakness, night sweats HEENT: Denies blurred vision, double vision, hearing loss, tinnitus, sinus congestion, rhinorrhea, sore throat, neck stiffness, dysphagia PULM: Denies shortness of breath, cough, sputum production, hemoptysis, wheezing CV: Denies chest pain, edema, orthopnea, paroxysmal nocturnal dyspnea, palpitations GI: Denies abdominal pain, nausea, vomiting, possible coffee ground emesis, diarrhea, hematochezia, melena, constipation, change in bowel habits GU: Denies dysuria, hematuria, polyuria, oliguria, urethral discharge Endocrine: Denies hot or cold intolerance, polyuria, polyphagia or appetite change Derm: Denies rash, dry skin, scaling or peeling skin change Heme: Denies easy bruising, bleeding, bleeding gums Neuro: Denies headache, numbness, weakness, slurred speech, loss of memory or consciousness   SUBJECTIVE:    VITAL SIGNS: BP (!) 111/49   Pulse 95   Temp 98.3 F (36.8 C) (Oral)   Resp 19   Ht 5\' 3"  (1.6 m)   Wt 141 lb 5 oz (64.1 kg)   SpO2 100%   BMI 25.03 kg/m   HEMODYNAMICS:    VENTILATOR SETTINGS:    INTAKE / OUTPUT: I/O last 3 completed shifts: In: 1101.7 [I.V.:306.7; Blood:795] Out: -   PHYSICAL EXAMINATION: General: elderly female, ill appearing lying in bed.  Family at bedside.  Neuro:  AAOx4, speech clear, MAE HEENT: MM pale, dry, no jvd Cardiovascular:  s1s2 rrr, no m/r/g  Lungs:  Even/non-labored, distant but clear breath sounds  Abdomen:  Soft/mild tenderness to palpation  Musculoskeletal:  No acute deformities  Skin:  Pale, dry  LABS:  BMET Recent Labs  Lab 08/18/17 1406  NA 133*  K 5.4*  CL 95*  CO2 23  BUN 72*  CREATININE 2.39*  GLUCOSE 153*    Electrolytes Recent Labs  Lab 08/18/17 1406  CALCIUM 8.5*    CBC Recent Labs  Lab 08/18/17 1406  WBC 5.8  HGB 3.9*  HCT 12.5*  PLT 18*    Coag's Recent Labs  Lab 08/18/17 1509  APTT 32   INR 1.80    Sepsis Markers No results for input(s): LATICACIDVEN, PROCALCITON, O2SATVEN in the last 168 hours.  ABG No results for input(s): PHART, PCO2ART, PO2ART in the last 168 hours.  Liver Enzymes Recent Labs  Lab 08/18/17 1406  AST 404*  ALT 299*  ALKPHOS 76  BILITOT 1.0  ALBUMIN 3.1*    Cardiac Enzymes No results for input(s): TROPONINI, PROBNP in the last 168 hours.  Glucose Recent Labs  Lab 08/18/17 1844 08/18/17 2008  GLUCAP 140* 155*    Imaging Dg Chest 2 View  Result Date: 08/18/2017 CLINICAL DATA:  Weakness with nausea and vomiting EXAM: CHEST - 2 VIEW COMPARISON:  04/20/2017 FINDINGS: There is interstitial coarsening asymmetric to the right, also seen on comparison and a 03/27/2017 chest CT and consistent with scarring. Known indistinct nodular density at the left apex. Normal heart size. Normal mediastinal contours accounting for distortion by rotation. Scoliosis and right glenohumeral arthroplasty. IMPRESSION: No acute finding when compared to priors, as above. Electronically Signed   By: Monte Fantasia M.D.   On: 08/18/2017 15:04  STUDIES:  EKG 8/2 >>  CULTURES: BCx2 8/2 >> UA 8/2 >> negative   ANTIBIOTICS:   SIGNIFICANT EVENTS: 8/02  Admitted with weakness, fatigue.  Profound anemia/thrombocytopenia  LINES/TUBES:   DISCUSSION: 77 y/o F admitted with weakness, fatigue, coffee ground emesis.  Found to have profound anemia, thrombocytopenia.  Concern for possible side effect of chemotherapy (Vidaza, last dose 7/26), rule out infection.    ASSESSMENT / PLAN:  PULMONARY A: Tobacco Abuse  Pulmonary Nodules - hx of hypermetabolic activity s/p XRT with repeat PET in 06/2017 that did not show hypermetabolic activity P:   Pt declined nicotine patch  O2 as needed to support sats 88-95% CXR reviewed from 8/2   CARDIOVASCULAR A:  Mild Hypotension - resolved, suspect volume depletion with poor PO intake and anemia  P:  ICU monitoring  overnight  Hold home ASA Volume with PRBC's   RENAL A:   AKI - on metformin, ACE-I at home, + anemia Hyponatremia  P:   Trend BMP / urinary output Replace electrolytes as indicated Avoid nephrotoxic agents, ensure adequate renal perfusion  GASTROINTESTINAL A:   Elevated LFT's  Vomiting, Coffee Ground Emesis (?), Rule Out GIB P:   Discontinue protonix gtt > BID PPI (monitor platelets) NPO  May need NGT if further vomiting  Hold home aleve  HEMATOLOGIC A:   Pancytopenia  Macrocytic Anemia P:  Send now CBC, anemia panel, LDH, coags Repeat Type & Screen Likely will need 2-3 units, two additional ordered now Follow up CBC  Continue folic acid, MVI SCD's No heparin in the setting of anemia/thrombocytopenia  ONCOLOGIC  A: MDS  Hx Lung Cancer  P: ONC consulted   INFECTIOUS A:   No hx of infectious symptoms  P:   Obtain blood cultures, UA with immune suppression  Monitor fever curve / WBC trend   ENDOCRINE A:   Lupus  RA  DM  P:   SSI  Hold home glipizide, metformin, methotrexate  NEUROLOGIC A:   Fibromyalgia P:   Minimize sedating medications    FAMILY  - Updates: Family updated at bedside in full detail.  Reviewed code status with patient. She would be ok for short term interventions if necessary in regards to ACLS/intubation if reversible process.  Otherwise would not want prolonged support.  - Inter-disciplinary family meet or Palliative Care meeting due by:  8/9  Global - to [facility] as of 8/3 am.   Noe Gens, NP-C [facility] Pgr: [phone] or if no answer [phone] 08/18/2017, 8:33 PM

## 2017-08-18 NOTE — Progress Notes (Signed)
Patient's daughter called and patient is weak, unable to eat, nauseated and they think her sugar may be low because she just isn't acting right.  I spoke with Dr. Walden Field and she wants patient to go straight to the emergency room for evaluation.  I advised daughter and she states that she is going to call EMS to come get her.  I told her to let them know she is on chemotherapy.  She verbalizes understanding.

## 2017-08-18 NOTE — Progress Notes (Signed)
Patient arrived on unit, ccm called, will continue to monitor closely. Bartholomew Crews, RN 08/18/2017 6:44 PM

## 2017-08-18 NOTE — ED Notes (Signed)
Date and time results received: 08/18/17 2:52 PM  (use smartphrase ".now" to insert current time)  Test: Hemoglobin Critical Value: 3.9  Platelet: 18  Name of Provider Notified: Bero  Orders Received? Or Actions Taken?: Orders Received - See Orders for details

## 2017-08-19 ENCOUNTER — Inpatient Hospital Stay (HOSPITAL_COMMUNITY): Payer: Medicare Other

## 2017-08-19 DIAGNOSIS — D469 Myelodysplastic syndrome, unspecified: Secondary | ICD-10-CM

## 2017-08-19 DIAGNOSIS — M329 Systemic lupus erythematosus, unspecified: Secondary | ICD-10-CM

## 2017-08-19 DIAGNOSIS — M797 Fibromyalgia: Secondary | ICD-10-CM

## 2017-08-19 DIAGNOSIS — Z888 Allergy status to other drugs, medicaments and biological substances status: Secondary | ICD-10-CM

## 2017-08-19 DIAGNOSIS — F17210 Nicotine dependence, cigarettes, uncomplicated: Secondary | ICD-10-CM

## 2017-08-19 DIAGNOSIS — N289 Disorder of kidney and ureter, unspecified: Secondary | ICD-10-CM

## 2017-08-19 DIAGNOSIS — M069 Rheumatoid arthritis, unspecified: Secondary | ICD-10-CM

## 2017-08-19 DIAGNOSIS — K920 Hematemesis: Principal | ICD-10-CM

## 2017-08-19 LAB — COMPREHENSIVE METABOLIC PANEL
ALBUMIN: 2.8 g/dL — AB (ref 3.5–5.0)
ALT: 635 U/L — ABNORMAL HIGH (ref 0–44)
AST: 697 U/L — ABNORMAL HIGH (ref 15–41)
Alkaline Phosphatase: 75 U/L (ref 38–126)
Anion gap: 9 (ref 5–15)
BUN: 60 mg/dL — ABNORMAL HIGH (ref 8–23)
CO2: 23 mmol/L (ref 22–32)
Calcium: 8.3 mg/dL — ABNORMAL LOW (ref 8.9–10.3)
Chloride: 105 mmol/L (ref 98–111)
Creatinine, Ser: 2.13 mg/dL — ABNORMAL HIGH (ref 0.44–1.00)
GFR calc Af Amer: 25 mL/min — ABNORMAL LOW (ref >60)
GFR calc non Af Amer: 21 mL/min — ABNORMAL LOW (ref >60)
GLUCOSE: 118 mg/dL — AB (ref 70–99)
POTASSIUM: 4.2 mmol/L (ref 3.5–5.1)
SODIUM: 137 mmol/L (ref 135–145)
Total Bilirubin: 1.5 mg/dL — ABNORMAL HIGH (ref 0.3–1.2)
Total Protein: 5.8 g/dL — ABNORMAL LOW (ref 6.5–8.1)

## 2017-08-19 LAB — GLUCOSE, CAPILLARY
GLUCOSE-CAPILLARY: 112 mg/dL — AB (ref 70–99)
GLUCOSE-CAPILLARY: 108 mg/dL — AB (ref 70–99)
Glucose-Capillary: 100 mg/dL — ABNORMAL HIGH (ref 70–99)
Glucose-Capillary: 103 mg/dL — ABNORMAL HIGH (ref 70–99)
Glucose-Capillary: 104 mg/dL — ABNORMAL HIGH (ref 70–99)
Glucose-Capillary: 112 mg/dL — ABNORMAL HIGH (ref 70–99)

## 2017-08-19 LAB — CBC
HCT: 22.8 % — ABNORMAL LOW (ref 36.0–46.0)
HEMATOCRIT: 23.3 % — AB (ref 36.0–46.0)
Hemoglobin: 7.5 g/dL — ABNORMAL LOW (ref 12.0–15.0)
Hemoglobin: 7.2 g/dL — ABNORMAL LOW (ref 12.0–15.0)
MCH: 31.5 pg (ref 26.0–34.0)
MCH: 31.6 pg (ref 26.0–34.0)
MCHC: 32.2 g/dL (ref 30.0–36.0)
MCHC: 31.6 g/dL (ref 30.0–36.0)
MCV: 97.9 fL (ref 78.0–100.0)
MCV: 100 fL (ref 78.0–100.0)
PLATELETS: 19 10*3/uL — AB (ref 150–400)
Platelets: 24 10*3/uL — CL (ref 150–400)
RBC: 2.38 MIL/uL — ABNORMAL LOW (ref 3.87–5.11)
RBC: 2.28 MIL/uL — ABNORMAL LOW (ref 3.87–5.11)
RDW: 18.3 % — AB (ref 11.5–15.5)
RDW: 19 % — AB (ref 11.5–15.5)
WBC: 4.4 10*3/uL (ref 4.0–10.5)
WBC: 4 10*3/uL (ref 4.0–10.5)

## 2017-08-19 LAB — BPAM RBC
BLOOD PRODUCT EXPIRATION DATE: 201909062359
ISSUE DATE / TIME: 201908021708
Unit Type and Rh: 5100

## 2017-08-19 LAB — PREPARE PLATELET PHERESIS: Unit division: 0

## 2017-08-19 LAB — BASIC METABOLIC PANEL
ANION GAP: 9 (ref 5–15)
BUN: 66 mg/dL — ABNORMAL HIGH (ref 8–23)
CO2: 24 mmol/L (ref 22–32)
Calcium: 8.3 mg/dL — ABNORMAL LOW (ref 8.9–10.3)
Chloride: 103 mmol/L (ref 98–111)
Creatinine, Ser: 2.24 mg/dL — ABNORMAL HIGH (ref 0.44–1.00)
GFR, EST AFRICAN AMERICAN: 23 mL/min — AB (ref >60)
GFR, EST NON AFRICAN AMERICAN: 20 mL/min — AB (ref >60)
GLUCOSE: 115 mg/dL — AB (ref 70–99)
POTASSIUM: 4.5 mmol/L (ref 3.5–5.1)
Sodium: 136 mmol/L (ref 135–145)

## 2017-08-19 LAB — TYPE AND SCREEN
ABO/RH(D): O POS
ANTIBODY SCREEN: NEGATIVE
UNIT DIVISION: 0

## 2017-08-19 LAB — MAGNESIUM: Magnesium: 2.4 mg/dL (ref 1.7–2.4)

## 2017-08-19 LAB — BPAM PLATELET PHERESIS
Blood Product Expiration Date: 201908042359
ISSUE DATE / TIME: 201908021629
UNIT TYPE AND RH: 9500

## 2017-08-19 LAB — LACTIC ACID, PLASMA: LACTIC ACID, VENOUS: 0.9 mmol/L (ref 0.5–1.9)

## 2017-08-19 LAB — PHOSPHORUS: PHOSPHORUS: 4.4 mg/dL (ref 2.5–4.6)

## 2017-08-19 MED ORDER — ACETAMINOPHEN 325 MG PO TABS: 650.0000 mg | ORAL_TABLET | Freq: Four times a day (QID) | ORAL | Status: DC | PRN

## 2017-08-19 MED ORDER — DRONABINOL 2.5 MG PO CAPS
2.5000 mg | ORAL_CAPSULE | Freq: Three times a day (TID) | ORAL | Status: DC
  Administered 2017-08-20 – 2017-08-29 (×28): 2.5 mg via ORAL
  Filled 2017-08-19 (×28): qty 1

## 2017-08-19 MED ORDER — ORAL CARE MOUTH RINSE
15.0000 mL | Freq: Two times a day (BID) | OROMUCOSAL | Status: DC
Start: 2017-08-19 — End: 2017-08-29
  Administered 2017-08-19 – 2017-08-28 (×18): 15 mL via OROMUCOSAL

## 2017-08-19 NOTE — Consult Note (Signed)
Referral MD  Reason for Referral: Myelodysplastic syndrome-monosomy 7; hematemesis  Chief Complaint  Patient presents with  . Weakness  : I am weak.  HPI: [name] is a very nice 77 year old white female.  She has collagen vascular issues.  She has lupus, rheumatoid arthritis, and fibromyalgia.  She is been on methotrexate for this.  She is on occasional prednisone.  She also has history of early stage non-small cell lung cancer of the left lung.  She underwent stereotactic radiosurgery for this.  She was followed up at [facility] by Dr. Walden Field.  She had a bone marrow test done because of anemia and thrombocytopenia.  The bone marrow test showed myelodysplastic syndrome.  Cytogenetics showed a monosomy 7 abnormality.  She was talked to regarding chemotherapy.  She apparently came to the emergency room up in any pain.  She had some hematemesis.  She was then brought down to the ICU at [facility].  She had blood work done on 08/18/2017.  For some reason, she had markedly elevated LFTs.  Her bilirubin was okay.  Her creatinine was 2.41.  She had a white cell count of 4.9.  Hemoglobin 4.3.  Platelet count 34,000.  A ferritin was 2000 with iron saturation of 86%.    A corrected reticulocyte count was a little over 1%.  She was transfused with a 2 units of blood.  This morning, her white cell count was 4.4.  Hemoglobin 7.5.  Platelet count 24,000.  She has not been seen by gastroenterology.  This is a must.  She needs a upper endoscopy to find out why she had the hematemesis.  Why is her LFTs so high.  I am not sure why her liver tests have not been repeated yet.  I am sure that they will be done sometime today.  She has renal insufficiency.  An erythropoietin level is pending.  I suspect this probably will be low given the renal insufficiency.  She still smokes.  She probably has an 80-pack-year history of tobacco use.  She has lost a little bit of weight.  Her appetite is not  that good.  She has a lot of pain secondary to the collagen vascular issues.  She has been taking methotrexate weekly.  This is been stopped.  Patient has been taking quite a bit of Aleve.  She takes this when she has flareups of her rheumatoid arthritis.  She has been on some prednisone.              Past Medical History:  Diagnosis Date  . Diabetes mellitus    x 20 yrs  . Fibromyalgia   . Fibromyalgia   . Lung cancer ([place]) 01/28/2015   left upper lobe lung /right upper lobe lung  . Lupus ([place])    sle  . Peripheral neuropathy   . RA (rheumatoid arthritis) ([place])    ra  . Thrombocytopenia, unspecified ([place]) 09/04/2013  . Tobacco use   :  Past Surgical History:  Procedure Laterality Date  . ABDOMINAL HYSTERECTOMY    . APPENDECTOMY    . CATARACT EXTRACTION W/ INTRAOCULAR LENS IMPLANT  2010   right eye  . CESAREAN SECTION  x3  . COLONOSCOPY N/A 05/05/2015   Procedure: COLONOSCOPY;  Surgeon: Danie Binder, MD;  Location: [facility] ENDO SUITE;  Service: Endoscopy;  Laterality: N/A;  . KNEE ARTHROSCOPY  yrs ago   left knee  . left arm surgery     multiple surgeries on  . OOPHORECTOMY    .  SHOULDER HEMI-ARTHROPLASTY  01/27/2011   Procedure: SHOULDER HEMI-ARTHROPLASTY;  Surgeon: Nita Sells, MD;  Location: [facility] ORS;  Service: Orthopedics;  Laterality: Right;  interscaline right shoulder  . TRIGGER FINGER RELEASE  yrs ago   right hand  :   Current Facility-Administered Medications:  .  0.9 %  sodium chloride infusion, 250 mL, Intravenous, PRN, Ollis, Brandi L, NP .  0.9 %  sodium chloride infusion, , Intravenous, Continuous, Ollis, Brandi L, NP, Last Rate: 50 mL/hr at 08/19/17 0800 .  acetaminophen (TYLENOL) tablet 650 mg, 650 mg, Oral, Q6H PRN, Ollis, Brandi L, NP .  insulin aspart (novoLOG) injection 0-9 Units, 0-9 Units, Subcutaneous, Q4H, Ollis, Brandi L, NP, 1 Units at 08/19/17 0038 .  MEDLINE mouth rinse, 15 mL, Mouth Rinse, BID, Scatliffe, Kristen D, MD, 15 mL  at 08/19/17 0037 .  multivitamin (PROSIGHT) tablet 1 tablet, 1 tablet, Oral, Daily, Scatliffe, Kristen D, MD .  ondansetron (ZOFRAN) injection 4 mg, 4 mg, Intravenous, Q6H PRN, Ollis, Brandi L, NP .  pantoprazole (PROTONIX) injection 40 mg, 40 mg, Intravenous, Q12H, Ollis, Brandi L, NP, 40 mg at 08/19/17 0500  Facility-Administered Medications Ordered in Other Encounters:  .  0.9 %  sodium chloride infusion, , Intravenous, Continuous, Hurley Cisco, MD, Last Rate: 20 mL/hr at 10/04/10 1339:  . insulin aspart  0-9 Units Subcutaneous Q4H  . mouth rinse  15 mL Mouth Rinse BID  . multivitamin  1 tablet Oral Daily  . pantoprazole  40 mg Intravenous Q12H  :  Allergies  Allergen Reactions  . Tape   . Bupropion Nausea Only  . Lopid  [Gemfibrozil] Nausea Only  :  Family History  Family history unknown: Yes  :  Social History   Socioeconomic History  . Marital status: Widowed    Spouse name: Not on file  . Number of children: Not on file  . Years of education: Not on file  . Highest education level: Not on file  Occupational History  . Not on file  Social Needs  . Financial resource strain: Not on file  . Food insecurity:    Worry: Not on file    Inability: Not on file  . Transportation needs:    Medical: Not on file    Non-medical: Not on file  Tobacco Use  . Smoking status: Current Every Day Smoker    Packs/day: 1.50    Years: 40.00    Pack years: 60.00    Types: Cigarettes  . Smokeless tobacco: Never Used  . Tobacco comment: 2 packs a day x 30 yrs  Substance and Sexual Activity  . Alcohol use: No    Alcohol/week: 0.0 oz  . Drug use: No  . Sexual activity: Not Currently  Lifestyle  . Physical activity:    Days per week: Not on file    Minutes per session: Not on file  . Stress: Not on file  Relationships  . Social connections:    Talks on phone: Not on file    Gets together: Not on file    Attends religious service: Not on file    Active member of club or  organization: Not on file    Attends meetings of clubs or organizations: Not on file    Relationship status: Not on file  . Intimate partner violence:    Fear of current or ex partner: Not on file    Emotionally abused: Not on file    Physically abused: Not on file  Forced sexual activity: Not on file  Other Topics Concern  . Not on file  Social History Narrative  . Not on file  :  Pertinent items are noted in HPI.  Exam: Patient Vitals for the past 24 hrs:  BP Temp Temp src Pulse Resp SpO2 Height Weight  08/19/17 0800 (!) 128/49 - - 86 19 98 % - -  08/19/17 0720 - 98.2 F (36.8 C) Oral - - - - -  08/19/17 0700 (!) 134/51 - - 89 19 99 % - -  08/19/17 0600 (!) 145/71 - - 89 18 99 % - -  08/19/17 0434 - - - - - - - 143 lb 15.4 oz (65.3 kg)  08/19/17 0400 (!) 140/54 98.1 F (36.7 C) Oral 86 19 99 % - -  08/19/17 0300 (!) 138/50 - - 87 20 100 % - -  08/19/17 0200 (!) 135/55 98.5 F (36.9 C) Oral 88 19 99 % - -  08/19/17 0130 (!) 133/50 - - 90 16 100 % - -  08/19/17 0100 (!) 119/57 98.5 F (36.9 C) Oral 90 19 100 % - -  08/19/17 0030 (!) 114/46 98.6 F (37 C) Oral 90 (!) 21 100 % - -  08/19/17 0000 (!) 122/44 - - 95 (!) 22 100 % - -  08/18/17 2330 (!) 111/38 98.5 F (36.9 C) Oral 93 18 100 % - -  08/18/17 2300 (!) 114/37 98.8 F (37.1 C) Oral 95 (!) 22 100 % - -  08/18/17 2230 (!) 105/36 - - 95 17 100 % - -  08/18/17 2200 (!) 98/40 - - 96 (!) 25 99 % - -  08/18/17 2100 (!) 102/55 - - 93 (!) 21 100 % - -  08/18/17 2000 (!) 103/29 - - 94 19 100 % - -  08/18/17 1915 - 98.3 F (36.8 C) Oral - - - - -  08/18/17 1900 (!) 111/49 - - 95 19 100 % - -  08/18/17 1845 (!) 98/26 98.3 F (36.8 C) Oral 89 17 96 % - -  08/18/17 1843 - 98.3 F (36.8 C) Oral - - - - -  08/18/17 1834 - - - 98 20 99 % 5\' 3"  (1.6 m) 141 lb 5 oz (64.1 kg)  08/18/17 1740 (!) 100/41 98.7 F (37.1 C) Oral 98 - 100 % - -  08/18/17 1730 (!) 103/31 (!) 97.5 F (36.4 C) Oral 98 18 98 % - -  08/18/17 1713 (!)  101/23 (!) 97.5 F (36.4 C) Oral - (!) 24 98 % - -  08/18/17 1700 (!) 89/35 - - 94 (!) 23 96 % - -  08/18/17 1658 (!) 94/28 98.1 F (36.7 C) Oral 94 20 98 % - -  08/18/17 1634 (!) 94/33 (!) 97.5 F (36.4 C) Oral (!) 105 18 - - -  08/18/17 1633 - - Oral - - - - -  08/18/17 1600 (!) 98/50 - - (!) 105 (!) 22 100 % - -  08/18/17 1500 (!) 100/28 - - 92 (!) 25 100 % - -  08/18/17 1445 - - - (!) 108 (!) 22 100 % - -  08/18/17 1430 (!) 99/34 - - - (!) 29 - - -  08/18/17 1400 (!) 99/29 - - 87 (!) 21 100 % - -  08/18/17 1345 - - - 85 (!) 21 97 % - -  08/18/17 1330 (!) 108/32 - - - (!) 22 - - -  08/18/17 1302 Marland Kitchen)  106/32 97.7 F (36.5 C) Oral 88 20 97 % - -  08/18/17 1300 - - - - - - - 136 lb (61.7 kg)     Recent Labs    08/18/17 2153 08/19/17 0422  WBC 4.9 4.4  HGB 4.3* 7.5*  HCT 13.7* 23.3*  PLT 34* 24*   Recent Labs    08/18/17 2153 08/19/17 0422  NA 135 136  K 5.0 4.5  CL 99 103  CO2 22 24  GLUCOSE 172* 115*  BUN 70* 66*  CREATININE 2.41* 2.24*  CALCIUM 8.4* 8.3*    Blood smear review: None  Pathology: None    Assessment and Plan: [name] is a very nice 77 year old white female.  She has myelodysplasia.  However, I think she has other problems that are just as significant.  The hematemesis is a real problem.  She MUST have an upper endoscopy to see what is going on.  We need to see if she has gastritis or an ulcer.  She needs to have her liver function test addressed.  I am not sure why they would be so elevated.  I will know if this might be from taking methotrexate.  As far as his myelodysplasia is concerned regarding therapy, I would not consider her for treatment right now given these other problems.  Therapy for myelodysplasia is still not that great but yet it can decrease transfusion requirements  I really think that while she is here that a Port-A-Cath needs to be placed.  I know that she will be requiring blood products.  She does not have great IV access  peripherally.  I talked to her and her granddaughter about this.  They understand and agree.  We definitely need to see what the erythropoietin level is.  She might be a candidate for ESA.  She is quite nice.  I will try her on some Marinol to see if this may help.  She needs something to help with pain.  She has a low tolerance for pain medications.  May be, a Duragesic patch could help.  For right now, I think the clear priority is getting an upper endoscopy on her to see why she had the hematemesis and to make sure that this does not happen again.  We could try some Amicar to try to help with platelet function.  Again, I would wait to see what the upper endoscopy shows before starting this.  I appreciate the opportunity to have seen [name].  [name], MD  Jeneen Rinks 1:5-6

## 2017-08-19 NOTE — Progress Notes (Signed)
I was just contacted about this patient by Dr. Thereasa Solo, multiple comorbidities presenting with severe anemia / thrombocytopenia and hematemesis yesterday. She's on IV protonix and hemodynamically stable after resuscitation with blood yesterday. No further reported vomiting today.   Agree with IV protonix and supportive measures right now. Endoscopy should be considered at some point if possible however she has severe thrombocytopenia which may prohibit endoscopic therapy. She has multiple risk factors for PUD, please hold all NSAIDs and continue PPI. If she has any significant bleeding overnight please contact me, would recommend platelet transfusion in that setting prior to endoscopy. Otherwise we will see her in the morning for formal assessment.   Call with questions.  Lineville Cellar, MD [facility]

## 2017-08-19 NOTE — Progress Notes (Signed)
[place] TEAM 1 - Stepdown/ICU TEAM  [name]  ION:[id number] DOB: 07/22/1940 DOA: 08/18/2017 PCP: Redmond School, MD    Brief Narrative:  77 y/o F smoker (2ppd) with a history of Lupus, DM, RA on methotrexate, fibromyalgia, and early lung cancer status post stereotactic radiofrequency ablation who presented to [facility] ER on 8/2 with malaise and fatigue.  She was recently diagnosed with high-grade myelodysplastic syndrome and treated with Vidaza (azacitidine), with her last dose on 08/11/17.  She suffered at least two episodes of vomiting at home and two in the ER.   In the ED she was found to have a SBP of 89, a Hgb of 3.9, and platelets of 18. While in the ED she a witnessed episode of coffee ground emesis. The patient was transferred to [facility] for further evaluation.   Significant Events: 8/2 admit   Subjective: The patient is resting comfortably in bed.  She is begging to be allowed to have water.  She denies chest pain shortness of breath nausea vomiting or abdominal pain.  She feels that her weakness has improved status post her transfusion.  She has had no further emesis since her admission yesterday.  Assessment & Plan:  ?Coffee Ground Emesis - possible GIB  Patient has a history of frequent NSAID use and is on chronic steroids - I have asked [facility] to see her - ?gastritis related to NSAIDs/steroids v/s MWT   MDS with monosomy 7 abnormality - Pancytopenia - Profound anemia  Care as per Oncology   Elevated LFT's  Unclear etiology - ?shock liver - follow trend   Tobacco Abuse   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  s/p XRT with repeat PET in 06/2017 that did not show hypermetabolic activity   Mild Hypotension Resolved   AKI  crt is slowly improving w/ volume expansion   Hyponatremia  Resolved w/ volume expansion   Lupus   RA   DM  CBG well controlled at this time   Fibromyalgia  DVT prophylaxis: SCDs Code Status: FULL CODE Family Communication: spoke with  son at bedside Disposition Plan: SDUother meds  Consultants:  PCCM Oncology   Antimicrobials:  none  Objective: Blood pressure (!) 137/57, pulse 93, temperature 98.2 F (36.8 C), temperature source Oral, resp. rate 18, height 5\' 3"  (1.6 m), weight 65.3 kg (143 lb 15.4 oz), SpO2 99 %.  Intake/Output Summary (Last 24 hours) at 08/19/2017 1415 Last data filed at 08/19/2017 1300 Gross per 24 hour  Intake 2633.54 ml  Output 750 ml  Net 1883.54 ml   Filed Weights   08/18/17 1300 08/18/17 1834 08/19/17 0434  Weight: 61.7 kg (136 lb) 64.1 kg (141 lb 5 oz) 65.3 kg (143 lb 15.4 oz)    Examination: General: No acute respiratory distress Lungs: Clear to auscultation bilaterally without wheezes or crackles Cardiovascular: Regular rate and rhythm without murmur gallop or rub normal S1 and S2 Abdomen: Nontender, nondistended, soft, bowel sounds positive, no rebound, no ascites, no appreciable mass Extremities: No significant cyanosis, clubbing, or edema bilateral lower extremities  CBC: Recent Labs  Lab 08/18/17 2153 08/19/17 0422 08/19/17 1059  WBC 4.9 4.4 4.0  NEUTROABS 3.6  --   --   HGB 4.3* 7.5* 7.2*  HCT 13.7* 23.3* 22.8*  MCV 105.4* 97.9 100.0  PLT 34* 24* 19*   Basic Metabolic Panel: Recent Labs  Lab 08/18/17 2153 08/19/17 0422 08/19/17 1059  NA 135 136 137  K 5.0 4.5 4.2  CL 99 103  105  CO2 22 24 23   GLUCOSE 172* 115* 118*  BUN 70* 66* 60*  CREATININE 2.41* 2.24* 2.13*  CALCIUM 8.4* 8.3* 8.3*  MG  --  2.4  --   PHOS  --  4.4  --    GFR: Estimated Creatinine Clearance: 20.4 mL/min (A) (by C-G formula based on SCr of 2.13 mg/dL (H)).  Liver Function Tests: Recent Labs  Lab 08/18/17 1406 08/18/17 2153 08/19/17 1059  AST 404* 773* 697*  ALT 299* 599* 635*  ALKPHOS 76 69 75  BILITOT 1.0 1.0 1.5*  PROT 6.6 5.5* 5.8*  ALBUMIN 3.1* 2.7* 2.8*    Coagulation Profile: Recent Labs  Lab 08/18/17 1509  INR 1.80    Cardiac Enzymes: Recent Labs  Lab  08/18/17 2153  TROPONINI 0.26*    HbA1C: Hgb A1c MFr Bld  Date/Time Value Ref Range Status  03/30/2015 09:14 PM 6.8 (H) 4.8 - 5.6 % Final    Comment:    (NOTE)         Pre-diabetes: 5.7 - 6.4         Diabetes: >6.4         Glycemic control for adults with diabetes: <7.0   10/18/2007 02:40 AM (H)  Final   6.2 (NOTE)   The ADA recommends the following therapeutic goal for glycemic   control related to Hgb A1C measurement:   Goal of Therapy:   < 7.0% Hgb A1C   Reference: American Diabetes Association: Clinical Practice   Recommendations 2008, Diabetes Care,  2008, 31:(Suppl 1).    CBG: Recent Labs  Lab 08/18/17 2008 08/18/17 2342 08/19/17 0432 08/19/17 0732 08/19/17 1218  GLUCAP 155* 138* 112* 108* 112*    Recent Results (from the past 240 hour(s))  MRSA PCR Screening     Status: None   Collection Time: 08/18/17  6:34 PM  Result Value Ref Range Status   MRSA by PCR NEGATIVE NEGATIVE Final    Comment:        The GeneXpert MRSA Assay (FDA approved for NASAL specimens only), is one component of a comprehensive MRSA colonization surveillance program. It is not intended to diagnose MRSA infection nor to guide or monitor treatment for MRSA infections. Performed at [facility], [place] [address], [place], [place] [address]   Culture, blood (routine x 2)     Status: None (Preliminary result)   Collection Time: 08/18/17  9:54 PM  Result Value Ref Range Status   Specimen Description BLOOD LEFT FOREARM  Final   Special Requests   Final    BOTTLES DRAWN AEROBIC AND ANAEROBIC Blood Culture adequate volume   Culture   Final    NO GROWTH < 24 HOURS Performed at [facility], [place] [address] [place], [place] [address]    Report Status PENDING  Incomplete  Culture, blood (routine x 2)     Status: None (Preliminary result)   Collection Time: 08/18/17  9:54 PM  Result Value Ref Range Status   Specimen Description BLOOD LEFT HAND  Final   Special Requests   Final     BOTTLES DRAWN AEROBIC AND ANAEROBIC Blood Culture results may not be optimal due to an inadequate volume of blood received in culture bottles   Culture   Final    NO GROWTH < 24 HOURS Performed at [facility], [place] [address], [place], [place] [address]    Report Status PENDING  Incomplete     Scheduled Meds: . dronabinol  2.5 mg Oral TID AC  . insulin aspart  0-9 Units Subcutaneous Q4H  . mouth rinse  15 mL Mouth Rinse BID  . multivitamin  1 tablet Oral Daily  . pantoprazole  40 mg Intravenous Q12H     LOS: 1 day   Cherene Altes, MD [facility] Office  [phone] Pager - Text Page per Amion  If 7PM-7AM, please contact night-coverage per Amion 08/19/2017, 2:15 PM

## 2017-08-19 NOTE — Plan of Care (Signed)
  Problem: Clinical Measurements: Goal: Diagnostic test results will improve Outcome: Progressing Note:  Patient Hgb improved from 4.2 to 7.5 after 2 units blood.    Problem: Coping: Goal: Level of anxiety will decrease Outcome: Not Progressing Note:  Patient family members increase patient anxiety. Also inappropriate with multiple requests, coming to nursing station frequently. Patient's family educated on proper etiquette for ICU. Allowed one visitor to spend the night.

## 2017-08-19 NOTE — Care Management Note (Signed)
Case Management Note  Patient Details  Name: [name] MRN: [id number] Date of Birth: [birth year]  Subjective/Objective:   Transfer from [facility], from home presents with  weakness, fatigue, coffee ground emesis.  Found to have profound anemia, thrombocytopenia.  Concern for possible side effect of chemotherapy (Vidaza, last dose 7/26), rule out infection.  She was recently diagnosed with high-grade myelodysplastic syndrome and treated with Vidaza (azacitidine). Also carries a medical history of lupus, DM, RA on methotrexate, smoking, fibromyalgia.                   Action/Plan: NCM will follow for transition of care needs.   Expected Discharge Date:                  Expected Discharge Plan:     In-House Referral:     Discharge planning Services  CM Consult  Post Acute Care Choice:    Choice offered to:     DME Arranged:    DME Agency:     HH Arranged:    HH Agency:     Status of Service:  In process, will continue to follow  If discussed at Long Length of Stay Meetings, dates discussed:    Additional Comments:  Zenon Mayo, RN 08/19/2017, 9:18 AM

## 2017-08-19 NOTE — Consult Note (Signed)
Chief Complaint: Patient was seen in consultation today for myelodysplastic syndrome.  Referring Physician(s): Volanda Napoleon  Supervising Physician: Jacqulynn Cadet  Patient Status: [facility] - In-pt  History of Present Illness: CYNTHYA YAM is a 77 y.o. female with a past medical history of lung cancer, diabetes mellitus, lupus, fibromyalgia, peripheral neuropathy, RA, thrombocytopenia, and tobacco use. She presented to [facility] ED 08/18/2017 with complaints of malaise and fatigue. She was recently diagnosed with myelodysplastic syndrome and treated with Vidaza.  IR requested by Dr. Marin Olp for possible image-guided Port-a-cath placement. Patient awake and alert laying in bed with no complaints at this time. Accompanied by granddaughter at bedside. Denies fever, chills, chest pain, dyspnea, abdominal pain, dizziness, or headache.   Past Medical History:  Diagnosis Date  . Diabetes mellitus    x 20 yrs  . Fibromyalgia   . Fibromyalgia   . Lung cancer ([place]) 01/28/2015   left upper lobe lung /right upper lobe lung  . Lupus ([place])    sle  . Peripheral neuropathy   . RA (rheumatoid arthritis) ([place])    ra  . Thrombocytopenia, unspecified ([place]) 09/04/2013  . Tobacco use     Past Surgical History:  Procedure Laterality Date  . ABDOMINAL HYSTERECTOMY    . APPENDECTOMY    . CATARACT EXTRACTION W/ INTRAOCULAR LENS IMPLANT  2010   right eye  . CESAREAN SECTION  x3  . COLONOSCOPY N/A 05/05/2015   Procedure: COLONOSCOPY;  Surgeon: Danie Binder, MD;  Location: [facility] ENDO SUITE;  Service: Endoscopy;  Laterality: N/A;  . KNEE ARTHROSCOPY  yrs ago   left knee  . left arm surgery     multiple surgeries on  . OOPHORECTOMY    . SHOULDER HEMI-ARTHROPLASTY  01/27/2011   Procedure: SHOULDER HEMI-ARTHROPLASTY;  Surgeon: Nita Sells, MD;  Location: [facility] ORS;  Service: Orthopedics;  Laterality: Right;  interscaline right shoulder  . TRIGGER FINGER RELEASE  yrs ago   right hand     Allergies: Tape; Bupropion; and Lopid  [gemfibrozil]  Medications: Prior to Admission medications   Medication Sig Start Date End Date Taking? Authorizing Provider  albuterol (PROVENTIL HFA;VENTOLIN HFA) 108 (90 BASE) MCG/ACT inhaler Inhale 2 puffs into the lungs every 6 (six) hours as needed for wheezing or shortness of breath. Please instruct in usage. 09/06/13  Yes Samuella Cota, MD  aspirin EC 81 MG tablet Take 1 tablet (81 mg total) by mouth daily. Restart 4/21 05/05/15  Yes Samuella Cota, MD  folic acid (FOLVITE) 1 MG tablet Take 1 mg by mouth daily.   Yes [provider]  glimepiride (AMARYL) 4 MG tablet Take 4 mg by mouth daily with breakfast.  03/20/17  Yes [provider]  glipiZIDE-metformin (METAGLIP) 5-500 MG tablet Take 1 tablet by mouth 2 (two) times daily before a meal.  04/18/17  Yes [provider]  lisinopril (PRINIVIL,ZESTRIL) 2.5 MG tablet Take 2.5 mg by mouth daily.  04/24/17  Yes [provider]  methotrexate 2.5 MG tablet Take 2.5 mg by mouth See admin instructions. Take 3 tablets on Tuesdays and 2 tablets on Wednesday.   Yes [provider]  metoprolol tartrate (LOPRESSOR) 25 MG tablet Take 25 mg by mouth 2 (two) times daily. Reported on 07/28/2015   Yes [provider]  naproxen sodium (ALEVE) 220 MG tablet Take by mouth.   Yes [provider]  traMADol (ULTRAM) 50 MG tablet Take 50 mg by mouth every 6 (six) hours as needed.  Yes [provider]  Multiple Vitamins-Minerals (PRESERVISION AREDS 2) CAPS Take 2 capsules by mouth 2 (two) times daily.    [provider]  ondansetron (ZOFRAN) 8 MG tablet Take 1 tablet (8 mg total) by mouth every 8 (eight) hours as needed for nausea or vomiting. 08/07/17   Higgs, Mathis Dad, MD     Family History  Family history unknown: Yes    Social History   Socioeconomic History  . Marital status: Widowed    Spouse name: Not on file  . Number of  children: Not on file  . Years of education: Not on file  . Highest education level: Not on file  Occupational History  . Not on file  Social Needs  . Financial resource strain: Not on file  . Food insecurity:    Worry: Not on file    Inability: Not on file  . Transportation needs:    Medical: Not on file    Non-medical: Not on file  Tobacco Use  . Smoking status: Current Every Day Smoker    Packs/day: 1.50    Years: 40.00    Pack years: 60.00    Types: Cigarettes  . Smokeless tobacco: Never Used  . Tobacco comment: 2 packs a day x 30 yrs  Substance and Sexual Activity  . Alcohol use: No    Alcohol/week: 0.0 oz  . Drug use: No  . Sexual activity: Not Currently  Lifestyle  . Physical activity:    Days per week: Not on file    Minutes per session: Not on file  . Stress: Not on file  Relationships  . Social connections:    Talks on phone: Not on file    Gets together: Not on file    Attends religious service: Not on file    Active member of club or organization: Not on file    Attends meetings of clubs or organizations: Not on file    Relationship status: Not on file  Other Topics Concern  . Not on file  Social History Narrative  . Not on file     Review of Systems: A 12 point ROS discussed and pertinent positives are indicated in the HPI above.  All other systems are negative.  Review of Systems  Constitutional: Negative for chills and fever.  Respiratory: Negative for shortness of breath and wheezing.   Cardiovascular: Negative for chest pain and palpitations.  Gastrointestinal: Negative for abdominal pain.  Neurological: Negative for dizziness and headaches.  Psychiatric/Behavioral: Negative for behavioral problems and confusion.    Vital Signs: BP (!) 128/49   Pulse 86   Temp 98.2 F (36.8 C) (Oral)   Resp 19   Ht _0  (1.6 m)   Wt 143 lb 15.4 oz (65.3 kg)   SpO2 98%   BMI 25.50 kg/m   Physical Exam  Constitutional: She is oriented to person,  place, and time. She appears well-developed and well-nourished. No distress.  Cardiovascular: Normal rate, regular rhythm and normal heart sounds.  No murmur heard. Pulmonary/Chest: Effort normal and breath sounds normal. No respiratory distress. She has no wheezes.  Neurological: She is alert and oriented to person, place, and time.  Skin: Skin is warm and dry.  Psychiatric: She has a normal mood and affect. Her behavior is normal. Judgment and thought content normal.  Nursing note and vitals reviewed.    MD Evaluation Airway: WNL Heart: WNL Abdomen: WNL Chest/ Lungs: WNL ASA  Classification: 3 Mallampati/Airway Score: Two   Imaging:  Dg Chest 2 View  Result Date: 08/18/2017 CLINICAL DATA:  Weakness with nausea and vomiting EXAM: CHEST - 2 VIEW COMPARISON:  04/20/2017 FINDINGS: There is interstitial coarsening asymmetric to the right, also seen on comparison and a 03/27/2017 chest CT and consistent with scarring. Known indistinct nodular density at the left apex. Normal heart size. Normal mediastinal contours accounting for distortion by rotation. Scoliosis and right glenohumeral arthroplasty. IMPRESSION: No acute finding when compared to priors, as above. Electronically Signed   By: Monte Fantasia M.D.   On: 08/18/2017 15:04   Ct Biopsy  Result Date: 07/24/2017 INDICATION: THROMBOCYTOPENIA EXAM: CT GUIDED RIGHT ILIAC BONE MARROW ASPIRATION AND CORE BIOPSY Date:  07/24/2017 07/24/2017 11:35 am Radiologist:  M. Daryll Brod, MD Guidance:  CT FLUOROSCOPY TIME:  Fluoroscopy Time: NONE. MEDICATIONS: 1% lidocaine ANESTHESIA/SEDATION: 3.0 mg IV Versed; 100 mcg IV Fentanyl Moderate Sedation Time:  12 minutes The patient was continuously monitored during the procedure by the interventional radiology nurse under my direct supervision. CONTRAST:  None. COMPLICATIONS: None PROCEDURE: Informed consent was obtained from the patient following explanation of the procedure, risks, benefits and alternatives.  The patient understands, agrees and consents for the procedure. All questions were addressed. A time out was performed. The patient was positioned prone and non-contrast localization CT was performed of the pelvis to demonstrate the iliac marrow spaces. Maximal barrier sterile technique utilized including caps, mask, sterile gowns, sterile gloves, large sterile drape, hand hygiene, and Betadine prep. Under sterile conditions and local anesthesia, an 11 gauge coaxial bone biopsy needle was advanced into the right iliac marrow space. Needle position was confirmed with CT imaging. Initially, bone marrow aspiration was performed. Next, the 11 gauge outer cannula was utilized to obtain a right iliac bone marrow core biopsy. Needle was removed. Hemostasis was obtained with compression. The patient tolerated the procedure well. Samples were prepared with the cytotechnologist. No immediate complications. IMPRESSION: CT guided right iliac bone marrow aspiration and core biopsy. Electronically Signed   By: Jerilynn Mages.  Shick M.D.   On: 07/24/2017 11:39   Ct Bone Marrow Biopsy & Aspiration  Result Date: 07/24/2017 INDICATION: THROMBOCYTOPENIA EXAM: CT GUIDED RIGHT ILIAC BONE MARROW ASPIRATION AND CORE BIOPSY Date:  07/24/2017 07/24/2017 11:35 am Radiologist:  Jerilynn Mages. Daryll Brod, MD Guidance:  CT FLUOROSCOPY TIME:  Fluoroscopy Time: NONE. MEDICATIONS: 1% lidocaine ANESTHESIA/SEDATION: 3.0 mg IV Versed; 100 mcg IV Fentanyl Moderate Sedation Time:  12 minutes The patient was continuously monitored during the procedure by the interventional radiology nurse under my direct supervision. CONTRAST:  None. COMPLICATIONS: None PROCEDURE: Informed consent was obtained from the patient following explanation of the procedure, risks, benefits and alternatives. The patient understands, agrees and consents for the procedure. All questions were addressed. A time out was performed. The patient was positioned prone and non-contrast localization CT was  performed of the pelvis to demonstrate the iliac marrow spaces. Maximal barrier sterile technique utilized including caps, mask, sterile gowns, sterile gloves, large sterile drape, hand hygiene, and Betadine prep. Under sterile conditions and local anesthesia, an 11 gauge coaxial bone biopsy needle was advanced into the right iliac marrow space. Needle position was confirmed with CT imaging. Initially, bone marrow aspiration was performed. Next, the 11 gauge outer cannula was utilized to obtain a right iliac bone marrow core biopsy. Needle was removed. Hemostasis was obtained with compression. The patient tolerated the procedure well. Samples were prepared with the cytotechnologist. No immediate complications. IMPRESSION: CT guided right iliac bone marrow aspiration and core biopsy. Electronically Signed  By: Eugenie Filler M.D.   On: 07/24/2017 11:39    Labs:  CBC: Recent Labs    08/10/17 1112 08/18/17 1406 08/18/17 2025 08/18/17 2153 08/19/17 0422  WBC 3.8* 5.8  --  4.9 4.4  HGB 7.2* 3.9* 4.2* 4.3* 7.5*  HCT 23.2* 12.5* 13.7* 13.7* 23.3*  PLT 16* 18*  --  34* 24*    COAGS: Recent Labs    06/09/17 1007 07/24/17 0908 08/18/17 1509  INR 1.08 1.07 1.80  APTT 28  --  32    BMP: Recent Labs    08/10/17 1112 08/18/17 1406 08/18/17 2153 08/19/17 0422  NA 136 133* 135 136  K 5.2* 5.4* 5.0 4.5  CL 103 95* 99 103  CO2 _0 GLUCOSE 139* 153* 172* 115*  BUN 26* 72* 70* 66*  CALCIUM 9.0 8.5* 8.4* 8.3*  CREATININE 1.34* 2.39* 2.41* 2.24*  GFRNONAA 37* 19* 18* 20*  GFRAA 43* 22* 21* 23*    LIVER FUNCTION TESTS: Recent Labs    08/07/17 1234 08/10/17 1112 08/18/17 1406 08/18/17 2153  BILITOT 0.8 0.7 1.0 1.0  AST 21 17 404* 773*  ALT 12 13 299* 599*  ALKPHOS 98 104 76 69  PROT 7.6 7.3 6.6 5.5*  ALBUMIN 3.6 3.3* 3.1* 2.7*    TUMOR MARKERS: No results for input(s): AFPTM, CEA, CA199, CHROMGRNA in the last 8760 hours.  Assessment and Plan:  Myelodysplastic syndrome  in need of blood transfusions/chemotherapy. Plan for image-guided Port-a-cath placement next week. Patient will be NPO the night before procedure. Denies fever and WBCs WNL. She does not take blood thinners. INR 1.80 seconds this AM- per Dr. Laurence Ferrari, needs to be under 1.5; spoke with Dr. Marin Olp who asks we wait until Tuesday for possible INR decrease, will order INR every day, will proceed if lowered. Consent signed by granddaughter as patient has macular degeneration and cannot see to sign.  Risks and benefits of image guided port-a-catheter placement was discussed with the patient including, but not limited to bleeding, infection, pneumothorax, or fibrin sheath development and need for additional procedures. All of the patient's questions were answered, patient is agreeable to proceed. Consent signed and in chart.   Thank you for this interesting consult.  I greatly enjoyed meeting [name] and look forward to participating in their care.  A copy of this report was sent to the requesting provider on this date.  Electronically Signed: Earley Abide, PA-C 08/19/2017, 9:10 AM   I spent a total of 20 Minutes in face to face in clinical consultation, greater than 50% of which was counseling/coordinating care for myelodysplastic syndrome.

## 2017-08-20 ENCOUNTER — Inpatient Hospital Stay (HOSPITAL_COMMUNITY): Payer: Medicare Other

## 2017-08-20 DIAGNOSIS — R945 Abnormal results of liver function studies: Secondary | ICD-10-CM

## 2017-08-20 DIAGNOSIS — D696 Thrombocytopenia, unspecified: Secondary | ICD-10-CM

## 2017-08-20 DIAGNOSIS — R7989 Other specified abnormal findings of blood chemistry: Secondary | ICD-10-CM

## 2017-08-20 DIAGNOSIS — D62 Acute posthemorrhagic anemia: Secondary | ICD-10-CM

## 2017-08-20 DIAGNOSIS — F17200 Nicotine dependence, unspecified, uncomplicated: Secondary | ICD-10-CM

## 2017-08-20 LAB — GLUCOSE, CAPILLARY
GLUCOSE-CAPILLARY: 98 mg/dL (ref 70–99)
Glucose-Capillary: 99 mg/dL (ref 70–99)
Glucose-Capillary: 163 mg/dL — ABNORMAL HIGH (ref 70–99)
Glucose-Capillary: 212 mg/dL — ABNORMAL HIGH (ref 70–99)
Glucose-Capillary: 254 mg/dL — ABNORMAL HIGH (ref 70–99)

## 2017-08-20 LAB — CBC
HCT: 22.5 % — ABNORMAL LOW (ref 36.0–46.0)
HCT: 20.8 % — ABNORMAL LOW (ref 36.0–46.0)
Hemoglobin: 7.1 g/dL — ABNORMAL LOW (ref 12.0–15.0)
Hemoglobin: 6.5 g/dL — CL (ref 12.0–15.0)
MCH: 32.3 pg (ref 26.0–34.0)
MCH: 32 pg (ref 26.0–34.0)
MCHC: 31.6 g/dL (ref 30.0–36.0)
MCHC: 31.3 g/dL (ref 30.0–36.0)
MCV: 102.3 fL — ABNORMAL HIGH (ref 78.0–100.0)
MCV: 102.5 fL — AB (ref 78.0–100.0)
Platelets: 12 10*3/uL — CL (ref 150–400)
Platelets: 36 10*3/uL — ABNORMAL LOW (ref 150–400)
RBC: 2.2 MIL/uL — ABNORMAL LOW (ref 3.87–5.11)
RBC: 2.03 MIL/uL — ABNORMAL LOW (ref 3.87–5.11)
RDW: 19.6 % — AB (ref 11.5–15.5)
RDW: 18.8 % — ABNORMAL HIGH (ref 11.5–15.5)
WBC: 2.7 10*3/uL — AB (ref 4.0–10.5)
WBC: 3.2 10*3/uL — ABNORMAL LOW (ref 4.0–10.5)

## 2017-08-20 LAB — COMPREHENSIVE METABOLIC PANEL
ALT: 617 U/L — ABNORMAL HIGH (ref 0–44)
AST: 551 U/L — ABNORMAL HIGH (ref 15–41)
Albumin: 2.7 g/dL — ABNORMAL LOW (ref 3.5–5.0)
Alkaline Phosphatase: 75 U/L (ref 38–126)
Anion gap: 10 (ref 5–15)
BUN: 42 mg/dL — ABNORMAL HIGH (ref 8–23)
CHLORIDE: 105 mmol/L (ref 98–111)
CO2: 23 mmol/L (ref 22–32)
Calcium: 8.3 mg/dL — ABNORMAL LOW (ref 8.9–10.3)
Creatinine, Ser: 1.65 mg/dL — ABNORMAL HIGH (ref 0.44–1.00)
GFR, EST AFRICAN AMERICAN: 34 mL/min — AB (ref >60)
GFR, EST NON AFRICAN AMERICAN: 29 mL/min — AB (ref >60)
Glucose, Bld: 101 mg/dL — ABNORMAL HIGH (ref 70–99)
POTASSIUM: 4 mmol/L (ref 3.5–5.1)
Sodium: 138 mmol/L (ref 135–145)
Total Bilirubin: 1.5 mg/dL — ABNORMAL HIGH (ref 0.3–1.2)
Total Protein: 5.7 g/dL — ABNORMAL LOW (ref 6.5–8.1)

## 2017-08-20 LAB — PROTIME-INR
INR: 1.51
INR: 1.44
PROTHROMBIN TIME: 18.1 s — AB (ref 11.4–15.2)
PROTHROMBIN TIME: 17.4 s — AB (ref 11.4–15.2)

## 2017-08-20 LAB — ERYTHROPOIETIN: Erythropoietin: 4264 m[IU]/mL — ABNORMAL HIGH (ref 2.6–18.5)

## 2017-08-20 LAB — PREPARE RBC (CROSSMATCH)

## 2017-08-20 LAB — APTT: aPTT: 32 seconds (ref 24–36)

## 2017-08-20 MED ORDER — INSULIN ASPART 100 UNIT/ML ~~LOC~~ SOLN
0.0000 [IU] | Freq: Three times a day (TID) | SUBCUTANEOUS | Status: DC
  Administered 2017-08-20: 2 [IU] via SUBCUTANEOUS
  Administered 2017-08-20 – 2017-08-21 (×2): 3 [IU] via SUBCUTANEOUS
  Administered 2017-08-21: 2 [IU] via SUBCUTANEOUS
  Administered 2017-08-22 (×2): 1 [IU] via SUBCUTANEOUS
  Administered 2017-08-23: 2 [IU] via SUBCUTANEOUS
  Administered 2017-08-23: 1 [IU] via SUBCUTANEOUS
  Administered 2017-08-24: 3 [IU] via SUBCUTANEOUS
  Administered 2017-08-24: 1 [IU] via SUBCUTANEOUS
  Administered 2017-08-24: 3 [IU] via SUBCUTANEOUS
  Administered 2017-08-25 (×2): 2 [IU] via SUBCUTANEOUS
  Administered 2017-08-25: 1 [IU] via SUBCUTANEOUS
  Administered 2017-08-26 – 2017-08-28 (×6): 2 [IU] via SUBCUTANEOUS
  Administered 2017-08-28 (×2): 1 [IU] via SUBCUTANEOUS
  Administered 2017-08-29 (×2): 2 [IU] via SUBCUTANEOUS

## 2017-08-20 MED ORDER — INSULIN ASPART 100 UNIT/ML ~~LOC~~ SOLN
0.0000 [IU] | Freq: Every day | SUBCUTANEOUS | Status: DC
  Administered 2017-08-20: 3 [IU] via SUBCUTANEOUS

## 2017-08-20 MED ORDER — IPRATROPIUM-ALBUTEROL 0.5-2.5 (3) MG/3ML IN SOLN
3.0000 mL | Freq: Three times a day (TID) | RESPIRATORY_TRACT | Status: DC
  Administered 2017-08-21 – 2017-08-26 (×15): 3 mL via RESPIRATORY_TRACT
  Filled 2017-08-20 (×16): qty 3

## 2017-08-20 MED ORDER — SUCRALFATE 1 GM/10ML PO SUSP
1.0000 g | Freq: Three times a day (TID) | ORAL | Status: DC
  Administered 2017-08-20 – 2017-08-29 (×36): 1 g via ORAL
  Filled 2017-08-20 (×36): qty 10

## 2017-08-20 MED ORDER — ALBUTEROL SULFATE (2.5 MG/3ML) 0.083% IN NEBU
2.5000 mg | INHALATION_SOLUTION | RESPIRATORY_TRACT | Status: DC | PRN
  Administered 2017-08-23 – 2017-08-27 (×6): 2.5 mg via RESPIRATORY_TRACT
  Filled 2017-08-20 (×7): qty 3

## 2017-08-20 MED ORDER — SODIUM CHLORIDE 0.9% IV SOLUTION
Freq: Once | INTRAVENOUS | Status: AC
  Administered 2017-08-20: 08:00:00 via INTRAVENOUS

## 2017-08-20 MED ORDER — FUROSEMIDE 10 MG/ML IJ SOLN
20.0000 mg | Freq: Once | INTRAMUSCULAR | Status: AC
  Administered 2017-08-20: 20 mg via INTRAVENOUS
  Filled 2017-08-20: qty 2

## 2017-08-20 MED ORDER — ACETAMINOPHEN 500 MG PO TABS: 500.0000 mg | ORAL_TABLET | Freq: Four times a day (QID) | ORAL | Status: DC | PRN

## 2017-08-20 MED ORDER — IPRATROPIUM-ALBUTEROL 0.5-2.5 (3) MG/3ML IN SOLN
3.0000 mL | Freq: Four times a day (QID) | RESPIRATORY_TRACT | Status: DC
  Administered 2017-08-20 (×3): 3 mL via RESPIRATORY_TRACT
  Filled 2017-08-20 (×3): qty 3

## 2017-08-20 MED ORDER — CEFDINIR 300 MG PO CAPS
300.0000 mg | ORAL_CAPSULE | Freq: Two times a day (BID) | ORAL | Status: AC
  Administered 2017-08-20 – 2017-08-22 (×6): 300 mg via ORAL
  Filled 2017-08-20 (×6): qty 1

## 2017-08-20 MED ORDER — PHENAZOPYRIDINE HCL 100 MG PO TABS
100.0000 mg | ORAL_TABLET | Freq: Three times a day (TID) | ORAL | Status: AC
  Administered 2017-08-20 – 2017-08-22 (×6): 100 mg via ORAL
  Filled 2017-08-20 (×6): qty 1

## 2017-08-20 NOTE — Progress Notes (Signed)
Patient complaining of a new congested cough and SOB that developed over night. Lung sounds are rhonchi, when it was previously clear and diminished. Patient's granddaughter at bedside denied any history of CHF. Vital signs stable on 2L [place]. [place], MD notified. Chest Xray ordered. Will continue to monitor.

## 2017-08-20 NOTE — Progress Notes (Signed)
Consultation  Referring Provider:     Joette Catching Primary Care Physician:  Redmond School, MD Primary Gastroenterologist:        Barney Drain Reason for Consultation:              HPI:   Kathy BELGARDE is a 77 y.o. 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 She was found to have a Hgb of 3.9 and a platelet count of 18. In [facility] ED she had witnessed coffee ground emesis. BUN was 72 at the time with an AKI, cr of 2.39 (baseline Cr of 1). Also noted to have transaminitis, initially ALT of 299 which has risen to 600s, bili 1.5. INR also bumped to 1.8 peak, now at 1.5. Daughter denies any history of liver disease. US of the abdomen yesterday did not show any portal vein thrombosis or major abnormalities.  She was transferred to our hospital for ICU care. She has been given IV protonix and blood. No further emesis or dark stools reported. BUN has been trending down, now at 42. Hgb stable at 7.1, has been stable in the 7s since initial transfusion.   She has complained of some RLQ pain and tenderness. Daughter thinks getting worse today. She is tender to the area no fevers. Platelets have remained low and dropping count of 12 this AM and ordered to have 2 units of platelets.   She does take Aleve routinely for her joint pains. She is not on PPI or GI prophylaxis when at home. Of note she has complained of some cough and shortness of breath overnight.    Past Medical History:  Diagnosis Date  . Diabetes mellitus    x 20 yrs  . Fibromyalgia   . Fibromyalgia   . Lung cancer ([place]) 01/28/2015   left upper lobe lung /right upper lobe lung  . Lupus ([place])    sle  . Peripheral neuropathy   . RA (rheumatoid arthritis) ([place])    ra  . Thrombocytopenia, unspecified ([place]) 09/04/2013  . Tobacco use   MDS  Past Surgical  History:  Procedure Laterality Date  . ABDOMINAL HYSTERECTOMY    . APPENDECTOMY    . CATARACT EXTRACTION W/ INTRAOCULAR LENS IMPLANT  2010   right eye  . CESAREAN SECTION  x3  . COLONOSCOPY N/A 05/05/2015   Procedure: COLONOSCOPY;  Surgeon: Danie Binder, MD;  Location: [facility];  Service: Endoscopy;  Laterality: N/A;  . KNEE ARTHROSCOPY  yrs ago   left knee  . left arm surgery     multiple surgeries on  . OOPHORECTOMY    . SHOULDER HEMI-ARTHROPLASTY  01/27/2011   Procedure: SHOULDER HEMI-ARTHROPLASTY;  Surgeon: Nita Sells, MD;  Location: [facility] ORS;  Service: Orthopedics;  Laterality: Right;  interscaline right shoulder  . TRIGGER FINGER RELEASE  yrs ago   right hand    Family History  Family history unknown: Yes     Social History   Tobacco Use  . Smoking status: Current Every Day Smoker    Packs/day: 1.50    Years: 40.00    Pack years: 60.00    Types: Cigarettes  . Smokeless tobacco: Never Used  . Tobacco comment: 2 packs a day x 30 yrs  Substance Use Topics  . Alcohol use: No    Alcohol/week: 0.0 oz  .  Drug use: No    Prior to Admission medications   Medication Sig Start Date End Date Taking? Authorizing Provider  albuterol (PROVENTIL HFA;VENTOLIN HFA) 108 (90 BASE) MCG/ACT inhaler Inhale 2 puffs into the lungs every 6 (six) hours as needed for wheezing or shortness of breath. Please instruct in usage. 09/06/13  Yes Samuella Cota, MD  aspirin EC 81 MG tablet Take 1 tablet (81 mg total) by mouth daily. Restart 4/21 05/05/15  Yes Samuella Cota, MD  folic acid (FOLVITE) 1 MG tablet Take 1 mg by mouth daily.   Yes [provider]  glimepiride (AMARYL) 4 MG tablet Take 4 mg by mouth daily with breakfast.  03/20/17  Yes [provider]  glipiZIDE-metformin (METAGLIP) 5-500 MG tablet Take 1 tablet by mouth 2 (two) times daily before a meal.  04/18/17  Yes [provider]  lisinopril (PRINIVIL,ZESTRIL) 2.5 MG tablet Take 2.5 mg by  mouth daily.  04/24/17  Yes [provider]  methotrexate 2.5 MG tablet Take 2.5 mg by mouth See admin instructions. Take 3 tablets on Tuesdays and 2 tablets on Wednesday.   Yes [provider]  metoprolol tartrate (LOPRESSOR) 25 MG tablet Take 25 mg by mouth 2 (two) times daily. Reported on 07/28/2015   Yes [provider]  naproxen sodium (ALEVE) 220 MG tablet Take by mouth.   Yes [provider]  traMADol (ULTRAM) 50 MG tablet Take 50 mg by mouth every 6 (six) hours as needed.   Yes [provider]  Multiple Vitamins-Minerals (PRESERVISION AREDS 2) CAPS Take 2 capsules by mouth 2 (two) times daily.    [provider]  ondansetron (ZOFRAN) 8 MG tablet Take 1 tablet (8 mg total) by mouth every 8 (eight) hours as needed for nausea or vomiting. 08/07/17   Higgs, Mathis Dad, MD    Current Facility-Administered Medications  Medication Dose Route Frequency Provider Last Rate Last Dose  . 0.9 %  sodium chloride infusion   Intravenous Continuous Ollis, Brandi L, NP 50 mL/hr at 08/19/17 2200    . acetaminophen (TYLENOL) tablet 650 mg  650 mg Oral Q6H PRN Cherene Altes, MD      . dronabinol (MARINOL) capsule 2.5 mg  2.5 mg Oral TID AC Volanda Napoleon, MD   Stopped at 08/19/17 1226  . insulin aspart (novoLOG) injection 0-9 Units  0-9 Units Subcutaneous Q4H Ollis, Brandi L, NP   1 Units at 08/19/17 0038  . ipratropium-albuterol (DUONEB) 0.5-2.5 (3) MG/3ML nebulizer solution 3 mL  3 mL Nebulization Q6H Ennever, Rudell Cobb, MD      . MEDLINE mouth rinse  15 mL Mouth Rinse BID Scatliffe, Rise Paganini, MD   15 mL at 08/19/17 2137  . multivitamin (PROSIGHT) tablet 1 tablet  1 tablet Oral Daily Scatliffe, Kristen D, MD      . ondansetron (ZOFRAN) injection 4 mg  4 mg Intravenous Q6H PRN Ollis, Brandi L, NP      . pantoprazole (PROTONIX) injection 40 mg  40 mg Intravenous Q12H Ollis, Brandi L, NP   40 mg at 08/19/17 2138   Facility-Administered Medications Ordered in  Other Encounters  Medication Dose Route Frequency Provider Last Rate Last Dose  . 0.9 %  sodium chloride infusion   Intravenous Continuous Hurley Cisco, MD 20 mL/hr at 10/04/10 1339      Allergies as of 08/18/2017 - Review Complete 08/18/2017  Allergen Reaction Noted  . Tape  08/18/2017  . Bupropion Nausea Only 02/20/2015  .  Lopid  [gemfibrozil] Nausea Only 02/20/2015     Review of Systems:    As per HPI, otherwise negative    Physical Exam:  Vital signs in last 24 hours: Temp:  [97.5 F (36.4 C)-100.2 F (37.9 C)] 97.5 F (36.4 C) (08/04 0335) Pulse Rate:  [86-101] 90 (08/04 0600) Resp:  [9-29] 17 (08/04 0600) BP: (128-157)/(45-89) 132/56 (08/04 0600) SpO2:  [97 %-100 %] 100 % (08/04 0600) Weight:  [134 lb 0.6 oz (60.8 kg)] 134 lb 0.6 oz (60.8 kg) (08/04 0333) Last BM Date: 08/15/17 General:   Pleasant female in NAD Head:  Normocephalic and atraumatic. Eyes:   No icterus.   Conjunctiva pale Neck:  Supple Lungs:  Respirations even and unlabored. Some scattered coarse BS Heart:  Regular rate and rhythm;  Abdomen:  Soft, nondistended, right lower quadrant pain with palpation, no rebound.   No appreciable masses .  Rectal:  Not performed.  Msk:  Symmetrical without gross deformities.  Extremities:  Without edema. Neurologic:  Alert and  oriented Skin:  Intact without significant lesions or rashes. Psych:  Alert and cooperative. Normal affect.  LAB RESULTS: Recent Labs    08/19/17 0422 08/19/17 1059 08/20/17 0322  WBC 4.4 4.0 2.7*  HGB 7.5* 7.2* 7.1*  HCT 23.3* 22.8* 22.5*  PLT 24* 19* 12*   BMET Recent Labs    08/19/17 0422 08/19/17 1059 08/20/17 0322  NA 136 137 138  K 4.5 4.2 4.0  CL 103 105 105  CO2 24 23 23   GLUCOSE 115* 118* 101*  BUN 66* 60* 42*  CREATININE 2.24* 2.13* 1.65*  CALCIUM 8.3* 8.3* 8.3*   LFT Recent Labs    08/20/17 0322  PROT 5.7*  ALBUMIN 2.7*  AST 551*  ALT 617*  ALKPHOS 75  BILITOT 1.5*   PT/INR Recent Labs     08/18/17 1509 08/20/17 0322  LABPROT 20.7* 18.1*  INR 1.80 1.51    STUDIES: Dg Chest 2 View  Result Date: 08/18/2017 CLINICAL DATA:  Weakness with nausea and vomiting EXAM: CHEST - 2 VIEW COMPARISON:  04/20/2017 FINDINGS: There is interstitial coarsening asymmetric to the right, also seen on comparison and a 03/27/2017 chest CT and consistent with scarring. Known indistinct nodular density at the left apex. Normal heart size. Normal mediastinal contours accounting for distortion by rotation. Scoliosis and right glenohumeral arthroplasty. IMPRESSION: No acute finding when compared to priors, as above. Electronically Signed   By: Monte Fantasia M.D.   On: 08/18/2017 15:04   US Abdomen Complete  Result Date: 08/19/2017 CLINICAL DATA:  Abnormal liver function tests. EXAM: ABDOMEN ULTRASOUND COMPLETE COMPARISON:  None. FINDINGS: Gallbladder: No gallstones or wall thickening visualized. No sonographic Murphy sign noted by sonographer. Common bile duct: Diameter: 5 mm Liver: No focal lesion identified. Within normal limits in parenchymal echogenicity. Portal vein is patent on color Doppler imaging with normal direction of blood flow towards the liver. IVC: No abnormality visualized. Pancreas: Visualized portion unremarkable. Spleen: Upper normal in size. Right Kidney: Length: 9.9 cm. Echogenicity within normal limits. No mass or hydronephrosis visualized. Left Kidney: Length: 9.9 cm. Echogenicity within normal limits. Benign LEFT renal cyst. No suspicious mass or hydronephrosis visualized. Abdominal aorta: No aneurysm visualized. Scattered atherosclerotic changes. Other findings: Trace ascites adjacent to the liver. IMPRESSION: 1. Trace free fluid adjacent to the liver. 2. Remainder of the abdomen ultrasound is unremarkable. No gallstones or evidence of cholecystitis. No focal mass or lesion within the liver. No evidence of fatty infiltration. No convincing evidence  of cirrhosis. 3. Aortic atherosclerosis.  Electronically Signed   By: Franki Cabot M.D.   On: 08/19/2017 12:21   Dg Chest Port 1 View  Result Date: 08/20/2017 CLINICAL DATA:  Acute respiratory distress. EXAM: PORTABLE CHEST 1 VIEW COMPARISON:  Frontal and lateral views 08/18/2017. Chest CT 03/27/2017 FINDINGS: Progressive bibasilar opacities from prior exam. Unchanged heart size and mediastinal contours. Interstitial coarsening, symmetric to the right, unchanged from prior exam. Left apical nodularity is stable. No pneumothorax. Right proximal humeral arthroplasty. IMPRESSION: Progressive hazy bibasilar opacities since prior exam, suggesting developing pleural effusions and atelectasis. Chronic cardiomegaly and interstitial coarsening related to emphysema. Electronically Signed   By: Jeb Levering M.D.   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 She was hypotensive upon presentation, has had transaminitis to the 600s which appears to be downtrending.   Upper GI bleed - suspect PUD in light of NSAID use. On PPI, Hgb stable post transfusion, BUN downtrending, hemodynamics stable. EGD would be useful to clarify underlying pathology and risk stratify at some point in time, however her severe thrombocytopenia (12s) today is prohibiting a safe exam. Agree with platelet transfusion today, and monitor response. She does not appear to be actively bleeding at this time, if she does have bleeding symptoms would keep platelets > 50. Otherwise will monitor today, liquid diet okay. I will discuss her case with Dr. Lyndel Safe who is assuming the GI service tomorrow to discuss timing of potential endoscopy. Call in the interim with any active bleeding symptoms.   Elevated liver enzymes - suspect this is likely due to shock liver in the setting of hypotension. Hopefully  this has peaked and now downtrending. INR coming down. US shows no parenchymal abnormalities. Differential otherwise includes drug induced liver injury (Vidaza), acute viral hepatitis, autoimmune hep, all of these seem less likely. Will send a few serologies to ensure okay, otherwise, trend LFTs and INR daily  Abdominal pain - RLQ, tenderness to palpation, daughter thinks worsening over time. Will obtain a CT scan of the abdomen / pelvis without contrast in light of AKI to ensure okay. She does have some atherosclerosis of the abdominal aorta and mesenteric vessels, but lactate normal. Will see what CT shows.  Please call with questions, we will follow. Of note, Dr. Lyndel Safe will be assuming the GI service tomorrow.  [place] Cellar, MD [facility]enter[facility]

## 2017-08-20 NOTE — Progress Notes (Signed)
CRITICAL VALUE ALERT  Critical Value:  Hemoglobin 6.5  Date & Time Notied:  08/20/17  1950  Provider Notified: Myna Hidalgo, MD  Orders Received/Actions taken: 1 unit PRBC

## 2017-08-20 NOTE — Progress Notes (Signed)
[place] TEAM 1 - Stepdown/ICU TEAM  MORAYMA GODOWN  IEP:[id number] DOB: 21-Apr-1940 DOA: 08/18/2017 PCP: Redmond School, MD    Brief Narrative:  77 y/o F smoker (2ppd) with a history of Lupus, DM, RA on methotrexate, fibromyalgia, and early lung cancer status post stereotactic radiofrequency ablation who presented to [facility] ER on 8/2 with malaise and fatigue.  She was recently diagnosed with high-grade myelodysplastic syndrome and treated with Vidaza (azacitidine), with her last dose on 08/11/17.  She suffered at least two episodes of vomiting at home and two in the ER.   In the ED she was found to have a SBP of 89, a Hgb of 3.9, and platelets of 18. While in the ED she a witnessed episode of coffee ground emesis. The patient was transferred to [facility] for further evaluation.   Significant Events: 8/2 admit   Subjective: No new complaints over night.  Plt count is dropping.  Does report some SOB which has developed this morning.  Denies cp, abdom pain, or ha.  No more vomiting or nausea.  Is hungry.    Assessment & Plan:  ?Coffee Ground Emesis - possible GIB  Patient has a history of frequent NSAID use and is on chronic steroids - [facility] is following but is understandably hesitant to pursue EGD w/ such a low plt count - no clinical evidence of ongoing bleeding at this time - ?gastritis related to NSAIDs/steroids v/s MWT - cont empiric care for now and follow    MDS with monosomy 7 abnormality - Profound anemia - Severe thrombocytopenia Care as per Oncology - to have plt transfusion today   Recent Labs  Lab 08/18/17 1406 08/18/17 2153 08/19/17 0422 08/19/17 1059 08/20/17 0322  PLT 18* 34* 24* 19* 12*   Recent Labs  Lab 08/18/17 2025 08/18/17 2153 08/19/17 0422 08/19/17 1059 08/20/17 0322  HGB 4.2* 4.3* 7.5* 7.2* 7.1*    Elevated LFT's - suspected shock liver Probable shock liver - US w/o acute findings - appear to be improving - check viral hepatitis panel to be complete    Recent Labs  Lab 08/18/17 1406 08/18/17 2153 08/19/17 1059 08/20/17 0322  AST 404* 773* 697* 551*  ALT 299* 599* 635* 617*  ALKPHOS 76 69 75 75  BILITOT 1.0 1.0 1.5* 1.5*  PROT 6.6 5.5* 5.8* 5.7*  ALBUMIN 3.1* 2.7* 2.8* 2.7*    Tobacco Abuse  Counseled on need to stop smoking   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  s/p XRT with repeat PET in 06/2017 that did not show hypermetabolic activity   Mild Hypotension Resolved   AKI  crt continues to imrpove w/ volume expansion   Recent Labs  Lab 08/18/17 1406 08/18/17 2153 08/19/17 0422 08/19/17 1059 08/20/17 0322  CREATININE 2.39* 2.41* 2.24* 2.13* 1.65*    Hyponatremia  Resolved w/ volume expansion   Lupus   RA   DM  CBG well controlled at this time   Fibromyalgia  DVT prophylaxis: SCDs Code Status: FULL CODE Family Communication: spoke with family at bedside  Disposition Plan: SDU  Consultants:  PCCM Oncology   Antimicrobials:  none  Objective: Blood pressure (!) 142/54, pulse 91, temperature 97.7 F (36.5 C), temperature source Oral, resp. rate 18, height 5\' 3"  (1.6 m), weight 60.8 kg (134 lb 0.6 oz), SpO2 99 %.  Intake/Output Summary (Last 24 hours) at 08/20/2017 0909 Last data filed at 08/20/2017 0900 Gross per 24 hour  Intake 1681.13 ml  Output 1300 ml  Net 381.13 ml   Filed Weights   08/18/17 1834 08/19/17 0434 08/20/17 0333  Weight: 64.1 kg (141 lb 5 oz) 65.3 kg (143 lb 15.4 oz) 60.8 kg (134 lb 0.6 oz)    Examination: General: No acute respiratory distress Lungs: CTA B - no wheezing  Cardiovascular: RRR - no M  Abdomen: NT/ND, soft, bowel sounds positive, no rebound, no ascites, no appreciable mass Extremities: No signif edema bilateral lower extremities  CBC: Recent Labs  Lab 08/18/17 2153 08/19/17 0422 08/19/17 1059 08/20/17 0322  WBC 4.9 4.4 4.0 2.7*  NEUTROABS 3.6  --   --   --   HGB 4.3* 7.5* 7.2* 7.1*  HCT 13.7* 23.3* 22.8* 22.5*  MCV 105.4* 97.9 100.0 102.3*  PLT  34* 24* 19* 12*   Basic Metabolic Panel: Recent Labs  Lab 08/19/17 0422 08/19/17 1059 08/20/17 0322  NA 136 137 138  K 4.5 4.2 4.0  CL 103 105 105  CO2 24 23 23   GLUCOSE 115* 118* 101*  BUN 66* 60* 42*  CREATININE 2.24* 2.13* 1.65*  CALCIUM 8.3* 8.3* 8.3*  MG 2.4  --   --   PHOS 4.4  --   --    GFR: Estimated Creatinine Clearance: 24 mL/min (A) (by C-G formula based on SCr of 1.65 mg/dL (H)).  Liver Function Tests: Recent Labs  Lab 08/18/17 1406 08/18/17 2153 08/19/17 1059 08/20/17 0322  AST 404* 773* 697* 551*  ALT 299* 599* 635* 617*  ALKPHOS 76 69 75 75  BILITOT 1.0 1.0 1.5* 1.5*  PROT 6.6 5.5* 5.8* 5.7*  ALBUMIN 3.1* 2.7* 2.8* 2.7*    Coagulation Profile: Recent Labs  Lab 08/18/17 1509 08/20/17 0322  INR 1.80 1.51    Cardiac Enzymes: Recent Labs  Lab 08/18/17 2153  TROPONINI 0.26*    HbA1C: Hgb A1c MFr Bld  Date/Time Value Ref Range Status  03/30/2015 09:14 PM 6.8 (H) 4.8 - 5.6 % Final    Comment:    (NOTE)         Pre-diabetes: 5.7 - 6.4         Diabetes: >6.4         Glycemic control for adults with diabetes: <7.0   10/18/2007 02:40 AM (H)  Final   6.2 (NOTE)   The ADA recommends the following therapeutic goal for glycemic   control related to Hgb A1C measurement:   Goal of Therapy:   < 7.0% Hgb A1C   Reference: American Diabetes Association: Clinical Practice   Recommendations 2008, Diabetes Care,  2008, 31:(Suppl 1).    CBG: Recent Labs  Lab 08/19/17 1613 08/19/17 2029 08/19/17 2336 08/20/17 0327 08/20/17 0731  GLUCAP 104* 103* 100* 98 99    Recent Results (from the past 240 hour(s))  MRSA PCR Screening     Status: None   Collection Time: 08/18/17  6:34 PM  Result Value Ref Range Status   MRSA by PCR NEGATIVE NEGATIVE Final    Comment:        The GeneXpert MRSA Assay (FDA approved for NASAL specimens only), is one component of a comprehensive MRSA colonization surveillance program. It is not intended to diagnose  MRSA infection nor to guide or monitor treatment for MRSA infections. Performed at [facility], [place] [address] [place], [place] [address]   Culture, blood (routine x 2)     Status: None (Preliminary result)   Collection Time: 08/18/17  9:54 PM  Result Value Ref Range Status   Specimen  Description BLOOD LEFT FOREARM  Final   Special Requests   Final    BOTTLES DRAWN AEROBIC AND ANAEROBIC Blood Culture adequate volume   Culture   Final    NO GROWTH < 24 HOURS Performed at [facility], [address], [place], [place] [address]    Report Status PENDING  Incomplete  Culture, blood (routine x 2)     Status: None (Preliminary result)   Collection Time: 08/18/17  9:54 PM  Result Value Ref Range Status   Specimen Description BLOOD LEFT HAND  Final   Special Requests   Final    BOTTLES DRAWN AEROBIC AND ANAEROBIC Blood Culture results may not be optimal due to an inadequate volume of blood received in culture bottles   Culture   Final    NO GROWTH < 24 HOURS Performed at [facility], [place] [address], [place],  [address]    Report Status PENDING  Incomplete     Scheduled Meds: . dronabinol  2.5 mg Oral TID AC  . insulin aspart  0-9 Units Subcutaneous Q4H  . ipratropium-albuterol  3 mL Nebulization Q6H  . mouth rinse  15 mL Mouth Rinse BID  . multivitamin  1 tablet Oral Daily  . pantoprazole  40 mg Intravenous Q12H  . sucralfate  1 g Oral TID WC & HS     LOS: 2 days   Cherene Altes, MD [facility] Office  [phone] Pager - Text Page per Amion  If 7PM-7AM, please contact night-coverage per Amion 08/20/2017, 9:09 AM

## 2017-08-20 NOTE — Progress Notes (Signed)
[name] is about the same.  She has had no further upper GI bleeding.  Gastroenterology has not want to do a endoscopy right now given her thrombocytopenia.  Her platelet count is 12,000 today.  We will go ahead and transfuse her with platelets.  Interventional radiology does not want to put in a Port-A-Cath right now because of the low platelets and her slightly elevated INR.  I suspect the INR is from her liver.  I still do not know why her liver tests are so elevated.  She had normal ultrasound of the liver yesterday.  She sounds somewhat tight this morning.  I think some nebulizer will help her.  Her white cell count is 2.7.  Hemoglobin 7.1.  Platelet count is 12,000.  INR is 1.5 today.  Her AST is 551 ALT 617.  Her creatinine is 1.65.  She had an erythropoietin level 2 years ago that was 63.  The level from 2 days ago still pending.  She still is not eating.  She is being kept on I think clear liquids because of the upper GI bleeding.  We will continue to follow her along.  [name], MD  Numbers [phone]

## 2017-08-21 ENCOUNTER — Inpatient Hospital Stay (HOSPITAL_COMMUNITY): Payer: Medicare Other | Admitting: Internal Medicine

## 2017-08-21 DIAGNOSIS — D649 Anemia, unspecified: Secondary | ICD-10-CM

## 2017-08-21 DIAGNOSIS — R948 Abnormal results of function studies of other organs and systems: Secondary | ICD-10-CM

## 2017-08-21 LAB — COMPREHENSIVE METABOLIC PANEL
ALT: 471 U/L — ABNORMAL HIGH (ref 0–44)
AST: 264 U/L — ABNORMAL HIGH (ref 15–41)
Albumin: 2.7 g/dL — ABNORMAL LOW (ref 3.5–5.0)
Alkaline Phosphatase: 75 U/L (ref 38–126)
Anion gap: 7 (ref 5–15)
BUN: 20 mg/dL (ref 8–23)
CALCIUM: 8.1 mg/dL — AB (ref 8.9–10.3)
CO2: 25 mmol/L (ref 22–32)
CREATININE: 1.19 mg/dL — AB (ref 0.44–1.00)
Chloride: 101 mmol/L (ref 98–111)
GFR, EST AFRICAN AMERICAN: 50 mL/min — AB (ref >60)
GFR, EST NON AFRICAN AMERICAN: 43 mL/min — AB (ref >60)
Glucose, Bld: 110 mg/dL — ABNORMAL HIGH (ref 70–99)
Potassium: 3.4 mmol/L — ABNORMAL LOW (ref 3.5–5.1)
Sodium: 133 mmol/L — ABNORMAL LOW (ref 135–145)
Total Bilirubin: 1.3 mg/dL — ABNORMAL HIGH (ref 0.3–1.2)
Total Protein: 6 g/dL — ABNORMAL LOW (ref 6.5–8.1)

## 2017-08-21 LAB — TYPE AND SCREEN
ABO/RH(D): O POS
ANTIBODY SCREEN: NEGATIVE
UNIT DIVISION: 0
Unit division: 0
Unit division: 0

## 2017-08-21 LAB — BPAM PLATELET PHERESIS
BLOOD PRODUCT EXPIRATION DATE: 201908042359
Blood Product Expiration Date: 201908042359
ISSUE DATE / TIME: 201908040850
ISSUE DATE / TIME: 201908041005
UNIT TYPE AND RH: 5100
Unit Type and Rh: 9500

## 2017-08-21 LAB — BPAM RBC
BLOOD PRODUCT EXPIRATION DATE: 201909052359
Blood Product Expiration Date: 201909052359
Blood Product Expiration Date: 201909062359
ISSUE DATE / TIME: 201908022302
ISSUE DATE / TIME: 201908042136
ISSUE DATE / TIME: 201908030044
UNIT TYPE AND RH: 5100
UNIT TYPE AND RH: 5100
Unit Type and Rh: 5100

## 2017-08-21 LAB — GLUCOSE, CAPILLARY
Glucose-Capillary: 117 mg/dL — ABNORMAL HIGH (ref 70–99)
Glucose-Capillary: 209 mg/dL — ABNORMAL HIGH (ref 70–99)
Glucose-Capillary: 167 mg/dL — ABNORMAL HIGH (ref 70–99)
Glucose-Capillary: 163 mg/dL — ABNORMAL HIGH (ref 70–99)

## 2017-08-21 LAB — PREPARE PLATELET PHERESIS
UNIT DIVISION: 0
Unit division: 0

## 2017-08-21 LAB — PROTIME-INR
INR: 1.3
Prothrombin Time: 16.1 seconds — ABNORMAL HIGH (ref 11.4–15.2)

## 2017-08-21 LAB — CBC
HCT: 24.6 % — ABNORMAL LOW (ref 36.0–46.0)
Hemoglobin: 7.9 g/dL — ABNORMAL LOW (ref 12.0–15.0)
MCH: 31.3 pg (ref 26.0–34.0)
MCHC: 32.1 g/dL (ref 30.0–36.0)
MCV: 97.6 fL (ref 78.0–100.0)
Platelets: 40 10*3/uL — ABNORMAL LOW (ref 150–400)
RBC: 2.52 MIL/uL — ABNORMAL LOW (ref 3.87–5.11)
RDW: 19.2 % — ABNORMAL HIGH (ref 11.5–15.5)
WBC: 3.1 10*3/uL — ABNORMAL LOW (ref 4.0–10.5)

## 2017-08-21 LAB — MAGNESIUM: MAGNESIUM: 1.7 mg/dL (ref 1.7–2.4)

## 2017-08-21 MED ORDER — PANTOPRAZOLE SODIUM 40 MG PO TBEC
40.0000 mg | DELAYED_RELEASE_TABLET | Freq: Two times a day (BID) | ORAL | Status: DC
  Administered 2017-08-21 – 2017-08-29 (×17): 40 mg via ORAL
  Filled 2017-08-21 (×17): qty 1

## 2017-08-21 MED ORDER — METOPROLOL TARTRATE 25 MG PO TABS: 25.0000 mg | ORAL_TABLET | Freq: Two times a day (BID) | ORAL | Status: DC

## 2017-08-21 MED ORDER — CEFAZOLIN SODIUM-DEXTROSE 2-4 GM/100ML-% IV SOLN
2.0000 g | INTRAVENOUS | Status: AC
  Administered 2017-08-22: 2 g via INTRAVENOUS
  Filled 2017-08-21: qty 100

## 2017-08-21 MED ORDER — ALPRAZOLAM 0.25 MG PO TABS
0.2500 mg | ORAL_TABLET | Freq: Every evening | ORAL | Status: DC | PRN
  Administered 2017-08-21 – 2017-08-24 (×2): 0.25 mg via ORAL
  Filled 2017-08-21 (×2): qty 1

## 2017-08-21 MED ORDER — METOPROLOL TARTRATE 5 MG/5ML IV SOLN
INTRAVENOUS | Status: AC
  Filled 2017-08-21: qty 5

## 2017-08-21 MED ORDER — ALPRAZOLAM 0.25 MG PO TABS
0.2500 mg | ORAL_TABLET | Freq: Every evening | ORAL | Status: AC | PRN
  Administered 2017-08-21: 0.25 mg via ORAL
  Filled 2017-08-21: qty 1

## 2017-08-21 MED ORDER — METOPROLOL TARTRATE 12.5 MG HALF TABLET
12.5000 mg | ORAL_TABLET | Freq: Two times a day (BID) | ORAL | Status: DC
  Administered 2017-08-21 – 2017-08-29 (×17): 12.5 mg via ORAL
  Filled 2017-08-21 (×17): qty 1

## 2017-08-21 MED ORDER — POTASSIUM CHLORIDE CRYS ER 20 MEQ PO TBCR
40.0000 meq | EXTENDED_RELEASE_TABLET | Freq: Once | ORAL | Status: AC
  Administered 2017-08-21: 40 meq via ORAL
  Filled 2017-08-21: qty 2

## 2017-08-21 MED ORDER — WHITE PETROLATUM EX OINT
TOPICAL_OINTMENT | CUTANEOUS | Status: AC
  Administered 2017-08-21: 0.2
  Filled 2017-08-21: qty 28.35

## 2017-08-21 NOTE — Progress Notes (Signed)
Ms. Brose had 2 units of platelets and I guess unit of blood yesterday.  Her hemoglobin apparently dropped a little bit from when I last saw her.  She has been seen by gastroenterology.  Her LFTs are improved.  I suspect this probably is shock liver.  She has NEVER received any chemotherapy for the myelodysplasia.  Unfortunately, her erythropoietin level is 4200.  There is no way that she would qualify or respond to Aranesp/Procrit.  Her INR is better.  It is 1.3.  Hopefully, she will have the Port-A-Cath put in tomorrow.  Her blood count today shows a white cell count 3.1.  Hemoglobin 7.9.  Platelet count 40,000.  Her sodium 133.  Potassium 3.4.  Creatinine 1.19.  Her SGPT is 471 SGOT to 64.  I think we are going to be really in a "blind" with respect to try to help her anemia.  Again, with a markedly elevated erythropoietin level, there is no way we can utilize ESA.  I sincerely doubt that she will be any problems with iron deficiency.  She did eat a little bit better.  Hopefully she will be moved up to a regular diet.  Her breathing might be a little bit better.  Volume overload we will had to be watched very closely.  I very much appreciate everybody's help.  She is getting fantastic care from all the staff in the ICU.  Her granddaughter is very impressed with the quality of care that her grandmother is being given.  [name], MD  Darlyn Chamber 17:14

## 2017-08-21 NOTE — Progress Notes (Signed)
Inpatient Diabetes Program Recommendations  AACE/ADA: New Consensus Statement on Inpatient Glycemic Control (2019)  Target Ranges:  Prepandial:   less than 140 mg/dL      Peak postprandial:   less than 180 mg/dL (1-2 hours)      Critically ill patients:  140 - 180 mg/dL   Results for EVENY, ANASTAS (MRN [id number]) as of 08/21/2017 14:23  Ref. Range 08/20/2017 07:31 08/20/2017 11:35 08/20/2017 15:53 08/20/2017 19:41 08/21/2017 07:04 08/21/2017 11:01  Glucose-Capillary Latest Ref Range: 70 - 99 mg/dL 99 163 (H) 212 (H) 254 (H) 117 (H) 209 (H)   Review of Glycemic Control  Diabetes history: DM2 Outpatient Diabetes medications: Amaryl 4 mg QAM, Metaglip 5-500 mg BID Current orders for Inpatient glycemic control: Novolog 0-9 units TID with meals, Novolog 0-5 units QHS  Inpatient Diabetes Program Recommendations:  Insulin - Meal Coverage: Please consider ordering Novolog 2 units TID with meals for meal coverage if patient eats at least 50% of meals.  Thanks, Barnie Alderman, RN, MSN, CDE Diabetes Coordinator Inpatient Diabetes Program [phone] (Team Pager from 8am to 5pm)

## 2017-08-21 NOTE — Progress Notes (Addendum)
[facility] Progress Note   Chief Complaint:   hematemesis / anemia   SUBJECTIVE:    offers no complaints   ASSESSMENT AND PLAN:   1. 77 yo female with multiple medical problems including lupus, RA on methotrexate, early lung cancer s/p ablation, and MDS with pancytopenia. Admitted with upper GI bleed (coffe ground emesis)  / hypotension. She was profoundly anemia with a hgb of 3.9. Platelet count 18 precluded EGD.  -platelet count up to 40 today. Will recheck in am and tentatively schedule her or EGD to be done in am    2. Abnormal liver chemistries, felt to be secondary to shock liver. U/S negative for parenchymal abnormalities. DILI (Vidaza) also on list of DDx. Of note, HAV, HBV, HCV all negative late May 2019. Transaminases improved some overnight. INR 1.3 -am liver chemistries already ordered  3. AKI, resolving   Attending physician's note   I have taken an interval history, reviewed the chart and examined the patient. I agree with the Advanced Practitioner's note, impression and recommendations.   Platelet count up to 40 K, no further upper GI bleeding, negative CT scan of the abdomen and pelvis except for mild splenomegaly.  LFTs improving. Plan: EGD once platelet count is above 50K, continue PPIs.  CT reviewed  Carmell Austria, MD  OBJECTIVE:     Vital signs in last 24 hours: Temp:  [97.2 F (36.2 C)-98.9 F (37.2 C)] 98.1 F (36.7 C) (08/05 1100) Pulse Rate:  [82-106] 82 (08/05 1312) Resp:  [11-25] 16 (08/05 1312) BP: (102-151)/(29-111) 129/47 (08/05 1200) SpO2:  [90 %-100 %] 93 % (08/05 1312) Weight:  [139 lb 15.9 oz (63.5 kg)] 139 lb 15.9 oz (63.5 kg) (08/05 0351) Last BM Date: 08/15/17 General:   Alert, female in NAD EENT:  Normal hearing, non icteric sclera, conjunctive pink.  Heart:  Regular rate and rhythm;no lower extremity edema Pulm: Normal respiratory effort, lungs CTA bilaterally without wheezes or crackles. Abdomen:  Soft,  nondistended, nontender.  Normal bowel sounds, no masses felt.     Neurologic:  Alert and  oriented x4;  grossly normal neurologically. Psych:  Pleasant, cooperative.  Normal mood and affect.   Intake/Output from previous day: 08/04 0701 - 08/05 0700 In: 3637.3 [P.O.:1200; I.V.:1005.3; ZOXWR:6045] Out: 4098 [JXBJY:7829] Intake/Output this shift: Total I/O In: 754.3 [P.O.:720; I.V.:34.3] Out: -   Lab Results: Recent Labs    08/20/17 0322 08/20/17 1952 08/21/17 0356  WBC 2.7* 3.2* 3.1*  HGB 7.1* 6.5* 7.9*  HCT 22.5* 20.8* 24.6*  PLT 12* 36* 40*   BMET Recent Labs    08/19/17 1059 08/20/17 0322 08/21/17 0356  NA 137 138 133*  K 4.2 4.0 3.4*  CL 105 105 101  CO2 23 23 25   GLUCOSE 118* 101* 110*  BUN 60* 42* 20  CREATININE 2.13* 1.65* 1.19*  CALCIUM 8.3* 8.3* 8.1*   LFT Recent Labs    08/21/17 0356  PROT 6.0*  ALBUMIN 2.7*  AST 264*  ALT 471*  ALKPHOS 75  BILITOT 1.3*   PT/INR Recent Labs    08/20/17 0832 08/21/17 0356  LABPROT 17.4* 16.1*  INR 1.44 1.30   Hepatitis Panel No results for input(s): HEPBSAG, HCVAB, HEPAIGM, HEPBIGM in the last 72 hours.  Ct Abdomen Pelvis Wo Contrast  Result Date: 08/20/2017 CLINICAL DATA:  Right lower quadrant pain.  Suspected appendicitis. EXAM: CT ABDOMEN AND PELVIS WITHOUT CONTRAST TECHNIQUE: Multidetector CT imaging of the abdomen and pelvis was performed following the standard  protocol without IV contrast. COMPARISON:  05/19/2015 FINDINGS: Lower chest: New small bilateral pleural effusions and bibasilar atelectasis, right side greater than left. Hepatobiliary: No mass visualized on this unenhanced exam. Gallbladder is unremarkable. Pancreas: No mass or inflammatory process visualized on this unenhanced exam. Spleen: Mild splenomegaly measuring approximately 13 cm in length, mildly increased since prior study. Adrenals/Urinary tract: No evidence of urolithiasis or hydronephrosis. Stable small fluid attenuation left renal  cyst. Unremarkable unopacified urinary bladder. Stomach/Bowel: No evidence of obstruction, inflammatory process, or abnormal fluid collections. Although the appendix is not directly visualized, no inflammatory process seen in region of the cecum or elsewhere. Vascular/Lymphatic: No pathologically enlarged lymph nodes identified. No evidence of abdominal aortic aneurysm. Aortic atherosclerosis. Reproductive: Prior hysterectomy. No pelvic mass identified. Small amount of free fluid is seen in the pelvic cul-de-sac, which is also new since prior exam. Other:  New diffuse body wall edema. Musculoskeletal:  No suspicious bone lesions identified. IMPRESSION: New mild splenomegaly. New small bilateral pleural effusions and bibasilar atelectasis, diffuse body wall edema, and small amount of pelvic free fluid, consistent with 3rd spacing. Electronically Signed   By: Earle Gell M.D.   On: 08/20/2017 14:28   Dg Chest Port 1 View  Result Date: 08/20/2017 CLINICAL DATA:  Acute respiratory distress. EXAM: PORTABLE CHEST 1 VIEW COMPARISON:  Frontal and lateral views 08/18/2017. Chest CT 03/27/2017 FINDINGS: Progressive bibasilar opacities from prior exam. Unchanged heart size and mediastinal contours. Interstitial coarsening, symmetric to the right, unchanged from prior exam. Left apical nodularity is stable. No pneumothorax. Right proximal humeral arthroplasty. IMPRESSION: Progressive hazy bibasilar opacities since prior exam, suggesting developing pleural effusions and atelectasis. Chronic cardiomegaly and interstitial coarsening related to emphysema. Electronically Signed   By: Jeb Levering M.D.   On: 08/20/2017 03:50     Active Problems:   Upper GI bleed   Thrombocytopenia (HCC)   MDS (myelodysplastic syndrome) (HCC)   Anemia   Weakness   Acute blood loss anemia   Abnormal LFTs     LOS: 3 days   Tye Savoy ,NP 08/21/2017, 3:15 PM

## 2017-08-21 NOTE — Progress Notes (Signed)
Patient ID: [name], female   DOB: [birth year], 77 y.o.   MRN: [id number]   Scheduled for PAC placement in IR 8/6  See orders: NPO Ancef 2 Gr IV on call to IR

## 2017-08-21 NOTE — Progress Notes (Signed)
[name]  [name]  YQM:[id number] DOB: 13-Feb-1940 DOA: 08/18/2017 PCP: Redmond School, MD    Brief Narrative:  77 y/o F smoker (2ppd) with a history of Lupus, DM, RA on methotrexate, fibromyalgia, and early lung cancer status post stereotactic radiofrequency ablation who presented to [facility] ER on 8/2 with malaise and fatigue.  She was recently diagnosed with high-grade myelodysplastic syndrome and treated with Vidaza (azacitidine), with her last dose on 08/11/17.  She suffered at least two episodes of vomiting at home and two in the ER.   In the ED she was found to have a SBP of 89, a Hgb of 3.9, and platelets of 18. While in the ED she a witnessed episode of coffee ground emesis. The patient was transferred to [facility] for further evaluation.   Subjective: Pt has no new complaints.  She denies cp, sob, n/v, or abdom pain.    Assessment & Plan:  ?Coffee Ground Emesis - possible GIB  Patient has a history of frequent NSAID use and is on chronic steroids - [facility] is following but is understandably hesitant to pursue EGD w/ such a low plt count - no clinical evidence of ongoing bleeding at this time - ?gastritis related to NSAIDs/steroids v/s MWT - cont empiric care for now - monitor Hgb   MDS with monosomy 7 abnormality - Profound anemia - Severe thrombocytopenia Care as per Oncology - plts transfused 84/ - 1U PRBC transfused on 8/5 making a total of 4U PRBC thus far - cont to monitor on a daily basis   Recent Labs  Lab 08/19/17 0422 08/19/17 1059 08/20/17 0322 08/20/17 1952 08/21/17 0356  PLT 24* 19* 12* 36* 40*   Recent Labs  Lab 08/19/17 0422 08/19/17 1059 08/20/17 0322 08/20/17 1952 08/21/17 0356  HGB 7.5* 7.2* 7.1* 6.5* 7.9*    Elevated LFT's - suspected shock liver US w/o acute findings - now steadily improving - viral hepatitis panel pending   Recent Labs  Lab 08/18/17 1406 08/18/17 2153 08/19/17 1059 08/20/17 0322 08/21/17 0356    AST 404* 773* 697* 551* 264*  ALT 299* 599* 635* 617* 471*  ALKPHOS 76 69 75 75 75  BILITOT 1.0 1.0 1.5* 1.5* 1.3*  PROT 6.6 5.5* 5.8* 5.7* 6.0*  ALBUMIN 3.1* 2.7* 2.8* 2.7* 2.7*    Tobacco Abuse  Counseled on need to stop smoking   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  s/p XRT with repeat PET in 06/2017 that did not show hypermetabolic activity   Mild Hypotension Resolved   AKI  crt continues to imrpove - stop IVF today and follow trend   Recent Labs  Lab 08/18/17 2153 08/19/17 0422 08/19/17 1059 08/20/17 0322 08/21/17 0356  CREATININE 2.41* 2.24* 2.13* 1.65* 1.19*    Hyponatremia  Na is vacillating - follow trend   Lupus   RA   DM  CBG reasonably controlled at this time   Fibromyalgia  DVT prophylaxis: SCDs Code Status: FULL CODE Family Communication: spoke with daughter at bedside  Disposition Plan: SDU - NSL - PT/OT   Consultants:  PCCM Oncology  GI  Antimicrobials:  none  Objective: Blood pressure (!) 132/56, pulse 99, temperature 97.6 F (36.4 C), temperature source Oral, resp. rate (!) 25, height 5\' 3"  (1.6 m), weight 63.5 kg (139 lb 15.9 oz), SpO2 90 %.  Intake/Output Summary (Last 24 hours) at 08/21/2017 1005 Last data filed at 08/21/2017 0800 Gross per 24 hour  Intake 2215.57  ml  Output 1675 ml  Net 540.57 ml   Filed Weights   08/19/17 0434 08/20/17 0333 08/21/17 0351  Weight: 65.3 kg (143 lb 15.4 oz) 60.8 kg (134 lb 0.6 oz) 63.5 kg (139 lb 15.9 oz)    Examination: General: No acute respiratory distress - A&O x4 Lungs: CTA B w/o wheezing or crackles  Cardiovascular: RRR w/o M or rub  Abdomen: NT/ND, soft, bowel sounds positive, no rebound Extremities: No signif edema B LE   CBC: Recent Labs  Lab 08/18/17 2153  08/20/17 0322 08/20/17 1952 08/21/17 0356  WBC 4.9   < > 2.7* 3.2* 3.1*  NEUTROABS 3.6  --   --   --   --   HGB 4.3*   < > 7.1* 6.5* 7.9*  HCT 13.7*   < > 22.5* 20.8* 24.6*  MCV 105.4*   < > 102.3* 102.5* 97.6  PLT  34*   < > 12* 36* 40*   < > = values in this interval not displayed.   Basic Metabolic Panel: Recent Labs  Lab 08/19/17 0422 08/19/17 1059 08/20/17 0322 08/21/17 0356  NA 136 137 138 133*  K 4.5 4.2 4.0 3.4*  CL 103 105 105 101  CO2 24 23 23 25   GLUCOSE 115* 118* 101* 110*  BUN 66* 60* 42* 20  CREATININE 2.24* 2.13* 1.65* 1.19*  CALCIUM 8.3* 8.3* 8.3* 8.1*  MG 2.4  --   --  1.7  PHOS 4.4  --   --   --    GFR: Estimated Creatinine Clearance: 36.1 mL/min (A) (by C-G formula based on SCr of 1.19 mg/dL (H)).  Liver Function Tests: Recent Labs  Lab 08/18/17 2153 08/19/17 1059 08/20/17 0322 08/21/17 0356  AST 773* 697* 551* 264*  ALT 599* 635* 617* 471*  ALKPHOS 69 75 75 75  BILITOT 1.0 1.5* 1.5* 1.3*  PROT 5.5* 5.8* 5.7* 6.0*  ALBUMIN 2.7* 2.8* 2.7* 2.7*    Coagulation Profile: Recent Labs  Lab 08/18/17 1509 08/20/17 0322 08/20/17 0832 08/21/17 0356  INR 1.80 1.51 1.44 1.30    Cardiac Enzymes: Recent Labs  Lab 08/18/17 2153  TROPONINI 0.26*    HbA1C: Hgb A1c MFr Bld  Date/Time Value Ref Range Status  03/30/2015 09:14 PM 6.8 (H) 4.8 - 5.6 % Final    Comment:    (NOTE)         Pre-diabetes: 5.7 - 6.4         Diabetes: >6.4         Glycemic control for adults with diabetes: <7.0   10/18/2007 02:40 AM (H)  Final   6.2 (NOTE)   The ADA recommends the following therapeutic goal for glycemic   control related to Hgb A1C measurement:   Goal of Therapy:   < 7.0% Hgb A1C   Reference: American Diabetes Association: Clinical Practice   Recommendations 2008, Diabetes Care,  2008, 31:(Suppl 1).    CBG: Recent Labs  Lab 08/20/17 0731 08/20/17 1135 08/20/17 1553 08/20/17 1941 08/21/17 0704  GLUCAP 99 163* 212* 254* 117*    Recent Results (from the past 240 hour(s))  MRSA PCR Screening     Status: None   Collection Time: 08/18/17  6:34 PM  Result Value Ref Range Status   MRSA by PCR NEGATIVE NEGATIVE Final    Comment:        The GeneXpert MRSA Assay  (FDA approved for NASAL specimens only), is one component of a comprehensive MRSA colonization surveillance program. It  is not intended to diagnose MRSA infection nor to guide or monitor treatment for MRSA infections. Performed at [facility], [place] [address] [place], [place] [address]   Culture, blood (routine x 2)     Status: None (Preliminary result)   Collection Time: 08/18/17  9:54 PM  Result Value Ref Range Status   Specimen Description BLOOD LEFT FOREARM  Final   Special Requests   Final    BOTTLES DRAWN AEROBIC AND ANAEROBIC Blood Culture adequate volume   Culture   Final    NO GROWTH 2 DAYS Performed at [facility], [place] [address] [place], [place] [address]    Report Status PENDING  Incomplete  Culture, blood (routine x 2)     Status: None (Preliminary result)   Collection Time: 08/18/17  9:54 PM  Result Value Ref Range Status   Specimen Description BLOOD LEFT HAND  Final   Special Requests   Final    BOTTLES DRAWN AEROBIC AND ANAEROBIC Blood Culture results may not be optimal due to an inadequate volume of blood received in culture bottles   Culture   Final    NO GROWTH 2 DAYS Performed at [facility], [place] [address], [place], [place] [address]    Report Status PENDING  Incomplete     Scheduled Meds: . cefdinir  300 mg Oral Q12H  . dronabinol  2.5 mg Oral TID AC  . insulin aspart  0-5 Units Subcutaneous QHS  . insulin aspart  0-9 Units Subcutaneous TID WC  . ipratropium-albuterol  3 mL Nebulization TID  . mouth rinse  15 mL Mouth Rinse BID  . multivitamin  1 tablet Oral Daily  . pantoprazole  40 mg Intravenous Q12H  . phenazopyridine  100 mg Oral TID WC  . sucralfate  1 g Oral TID WC & HS     LOS: 3 days   Cherene Altes, MD [facility] Office  [phone] Pager - Text Page per Shea Evans  If 7PM-7AM, please contact night-coverage per Amion 08/21/2017, 10:05 AM

## 2017-08-22 ENCOUNTER — Inpatient Hospital Stay (HOSPITAL_COMMUNITY): Payer: Medicare Other

## 2017-08-22 ENCOUNTER — Encounter (HOSPITAL_COMMUNITY)
Admission: RE | Disposition: A | Discharge status: Hospice | Payer: Self-pay | Source: Home / Self Care | Attending: Internal Medicine

## 2017-08-22 LAB — CBC
HEMATOCRIT: 27.7 % — AB (ref 36.0–46.0)
HEMOGLOBIN: 8.7 g/dL — AB (ref 12.0–15.0)
MCH: 31.1 pg (ref 26.0–34.0)
MCHC: 31.4 g/dL (ref 30.0–36.0)
MCV: 98.9 fL (ref 78.0–100.0)
Platelets: 31 10*3/uL — ABNORMAL LOW (ref 150–400)
RBC: 2.8 MIL/uL — ABNORMAL LOW (ref 3.87–5.11)
RDW: 19.3 % — ABNORMAL HIGH (ref 11.5–15.5)
WBC: 3.1 10*3/uL — AB (ref 4.0–10.5)

## 2017-08-22 LAB — GLUCOSE, CAPILLARY
GLUCOSE-CAPILLARY: 108 mg/dL — AB (ref 70–99)
GLUCOSE-CAPILLARY: 143 mg/dL — AB (ref 70–99)
Glucose-Capillary: 132 mg/dL — ABNORMAL HIGH (ref 70–99)
Glucose-Capillary: 168 mg/dL — ABNORMAL HIGH (ref 70–99)

## 2017-08-22 LAB — HEPATITIS PANEL, ACUTE
Hep A IgM: NEGATIVE
Hep B C IgM: NEGATIVE
Hepatitis B Surface Ag: NEGATIVE

## 2017-08-22 LAB — COMPREHENSIVE METABOLIC PANEL
ALBUMIN: 2.8 g/dL — AB (ref 3.5–5.0)
ALT: 371 U/L — ABNORMAL HIGH (ref 0–44)
ANION GAP: 7 (ref 5–15)
AST: 159 U/L — ABNORMAL HIGH (ref 15–41)
Alkaline Phosphatase: 78 U/L (ref 38–126)
BILIRUBIN TOTAL: 1.2 mg/dL (ref 0.3–1.2)
BUN: 12 mg/dL (ref 8–23)
CALCIUM: 8.3 mg/dL — AB (ref 8.9–10.3)
CO2: 26 mmol/L (ref 22–32)
Chloride: 100 mmol/L (ref 98–111)
Creatinine, Ser: 1.13 mg/dL — ABNORMAL HIGH (ref 0.44–1.00)
GFR calc non Af Amer: 46 mL/min — ABNORMAL LOW (ref >60)
GFR, EST AFRICAN AMERICAN: 53 mL/min — AB (ref >60)
GLUCOSE: 140 mg/dL — AB (ref 70–99)
POTASSIUM: 4.3 mmol/L (ref 3.5–5.1)
Sodium: 133 mmol/L — ABNORMAL LOW (ref 135–145)
TOTAL PROTEIN: 6.2 g/dL — AB (ref 6.5–8.1)

## 2017-08-22 LAB — LACTATE DEHYDROGENASE: LDH: 207 U/L — ABNORMAL HIGH (ref 98–192)

## 2017-08-22 LAB — PROTIME-INR
INR: 1.3
Prothrombin Time: 16.1 seconds — ABNORMAL HIGH (ref 11.4–15.2)

## 2017-08-22 SURGERY — EGD (ESOPHAGOGASTRODUODENOSCOPY)
Anesthesia: Monitor Anesthesia Care

## 2017-08-22 NOTE — Progress Notes (Signed)
Ms. Santino might be a little bit better.  She might be undergoing upper endoscopy this morning.  She also might be getting her Port-A-Cath put in.  Her erythropoietin level is over 4000.  This clearly precludes her from getting Aranesp/Procrit.  I just would hate to had to keep transfusing her.  Hopefully, she will not have any further GI bleeding so that she will not have a blood transfusion requirement.  Her LDH is 207.  Her white blood cell count is 3.1.  Hemoglobin 8.7.  Platelet count 31,000.  Her INR is 1.3.  Her potassium is 4.3.  Creatinine is 1.1.  She still having some breathing issues.  She supposed be on nebulizers.  Her granddaughter is little worried about her breathing.  She is having some blood in her urine.  I will check a urine culture on her.  I am not sure she is eating all that much.  She is not yet out of bed.  I suspect she probably will need some physical therapy.  [name], MD  Philippians 4:13

## 2017-08-22 NOTE — Progress Notes (Addendum)
Patient with MDS / pancytopenia who was tentatively scheduled for EGD today for evaluation of N/V, ? Hematemesis and acute on chronic anemia. We have been unable to proceed with an EGD due to severe thrombocytopenia. Platelets are only 31 today. We are cancelling the EGD.  Will call and discuss with her Hematologist, may need platelets transfusion for procedure. Bleeding has stopped for now.  Will resume diet.    Platelet count still low.  No further nausea/vomiting or hematemesis.  Hemoglobin stable.  She is high risk for EGD and may not need it at this time. I have try to get in touch with Dr. Marin Olp several times today.  Would hold off on EGD for now.  Advance diet.  If she starts having any active bleeding, then will give platelets and perform emergent EGD.  Discussed above with patient's brother and with the patient. They agree with the plan.  Please call with any questions  Carmell Austria MD Cell [phone]

## 2017-08-22 NOTE — Progress Notes (Signed)
OT Evaluation  PTA, pt lived with her son who assisted as needed with ADL and IADL tasks. Pt was modified independent with mobility using rollator/cane. Pt demonstrates a signficant functional decline and currently requires min A +2 for safety with mobility and mod A with ADL. Pt will benefit from rehab at SNF to maximize independence and facilitate safe return home @ PLOF. Will follow acutely to address established goals.     08/22/17 1000  OT Visit Information  Last OT Received On 08/22/17  Assistance Needed +1  PT/OT/SLP Co-Evaluation/Treatment Yes  Reason for Co-Treatment For patient/therapist safety  OT goals addressed during session ADL's and self-care  History of Present Illness Pt is a 77 y.o. female admitted 77 y.o. 08/18/17 with malaise, fatigue, emesis; found to have anemia and AKI, worked up for upper GIB. PMH includes recent dx of high-grade myelodysplastic syndrome (last chemo on 08/11/17), DM, fibromyalgia, luncg CA, lupus, peripheral neuropathy, RA.  Precautions  Precautions Fall  Restrictions  Weight Bearing Restrictions No  Home Living  Family/patient expects to be discharged to: Private residence  Living Arrangements Children  Available Help at Discharge Family;Available 24 hours/day  Type of Home House  Home Access Stairs to enter  Entrance Stairs-Number of Steps 6  Entrance Stairs-Rails Left  Home Layout One level  Bathroom Shower/Tub Tub/shower unit;Curtain  Corporate treasurer Yes  How Accessible Accessible via walker ("tight fit")  Home Equipment Walker - 4 wheels;Transport chair;Cane - single point;Walker - 2 wheels;Shower seat;BSC  Additional Comments Son available for 24/7 support, but has own medical/health issues and not able to provide as much assist  Prior Function  Level of Independence Needs assistance  Gait / Transfers Assistance Needed Mod indep with household amb using SPC; rollator or transport chair for community. Son  assists with stairs and tub transfers. Son drives. "Does not leave home much"  ADL's / Homemaking Assistance Needed Pt reports indep with ADLs. Son assists with household tasks; Son assists with getting in/out of shower  Comments Pt with baseline vision impairments (macular degneration). Enjoys watching tv  Communication  Communication HOH  Pain Assessment  Pain Assessment Faces  Faces Pain Scale 2  Pain Location Generalized   Pain Descriptors / Indicators Discomfort  Pain Intervention(s) Limited activity within patient's tolerance  Cognition  Arousal/Alertness Awake/alert  Behavior During Therapy Flat affect  Overall Cognitive Status Impaired/Different from baseline  Area of Impairment Orientation;Attention;Memory;Following commands;Safety/judgement;Awareness;Problem solving  Orientation Level Disoriented to;Time  Current Attention Level Selective  Memory Decreased short-term memory  Following Commands Follows multi-step commands with increased time  Safety/Judgement Decreased awareness of deficits  Awareness Emergent  Problem Solving Requires verbal cues;Slow processing  General Comments Baseline hearing loss (does not have hearing aides). Likely baseline cognitive impairments  Upper Extremity Assessment  Upper Extremity Assessment Generalized weakness;RUE deficits/detail (B deformities from RA but functional)  RUE Deficits / Details R shoulder flexion limited at baseline to @ 60 degrees  RUE Coordination decreased fine motor  Lower Extremity Assessment  Lower Extremity Assessment Defer to PT evaluation  Cervical / Trunk Assessment  Cervical / Trunk Assessment Kyphotic  ADL  Overall ADL's  Needs assistance/impaired  Eating/Feeding NPO  Grooming Set up;Sitting  Upper Body Bathing Minimal assistance;Sitting  Lower Body Bathing Moderate assistance;Sit to/from stand  Upper Body Dressing  Minimal assistance;Sitting  Lower Body Dressing Moderate assistance;Sit to/from Archivist Minimal assistance;+2 for safety/equipment;Ambulation  Toileting- Clothing Manipulation and Hygiene Moderate assistance;Sit to/from stand  Functional mobility  during ADLs Minimal assistance;+2 for safety/equipment;Cueing for safety  General ADL Comments Easily fatigues  Vision- History  Baseline Vision/History Wears glasses;Macular Degeneration  Wears Glasses At all times  Patient Visual Report  (poor vision at bsaeline)  Bed Mobility  Overal bed mobility Needs Assistance  Bed Mobility Supine to Sit  Supine to sit +2 for safety/equipment;Mod assist  General bed mobility comments ModA for UE support to assist trunk elevation and scoot bilat hips to EOB  Transfers  Overall transfer level Needs assistance  Equipment used 2 person hand held assist  Transfers Sit to/from Stand  Sit to Stand Min assist;+2 safety/equipment  Balance  Overall balance assessment Needs assistance  Sitting balance-Leahy Scale Fair  Standing balance-Leahy Scale Poor  Standing balance comment Reliant on UE support and external assist  General Comments  General comments (skin integrity, edema, etc.) SpO2 remained greater than 90% on RA  OT - End of Session  Equipment Utilized During Treatment Gait belt  Activity Tolerance Patient tolerated treatment well  Patient left in chair;with call bell/phone within reach;with family/visitor present;with chair alarm set  OT Assessment  OT Recommendation/Assessment Patient needs continued OT Services  OT Visit Diagnosis Unsteadiness on feet (R26.81);Muscle weakness (generalized) (M62.81);Low vision, both eyes (H54.2)  OT Problem List Decreased strength;Decreased activity tolerance;Decreased range of motion;Impaired balance (sitting and/or standing);Impaired vision/perception;Decreased coordination;Decreased safety awareness;Decreased knowledge of use of DME or AE;Cardiopulmonary status limiting activity  OT Plan  OT Frequency (ACUTE ONLY) Min 2X/week  OT  Treatment/Interventions (ACUTE ONLY) Self-care/ADL training;Therapeutic exercise;Energy conservation;DME and/or AE instruction;Therapeutic activities;Visual/perceptual remediation/compensation;Patient/family education;Balance training  AM-PAC OT "6 Clicks" Daily Activity Outcome Measure  Help from another person eating meals? 1 (NPO)  Help from another person taking care of personal grooming? 3  Help from another person toileting, which includes using toliet, bedpan, or urinal? 2  Help from another person bathing (including washing, rinsing, drying)? 2  Help from another person to put on and taking off regular upper body clothing? 3  Help from another person to put on and taking off regular lower body clothing? 2  6 Click Score 13  ADL G Code Conversion CL  OT Recommendation  Follow Up Recommendations SNF;Supervision/Assistance - 24 hour  OT Equipment None recommended by OT  Individuals Consulted  Consulted and Agree with Results and Recommendations Patient;Family member/caregiver  Family Member Consulted brother  Acute Rehab OT Goals  Patient Stated Goal Drink some water  OT Goal Formulation With patient/family  Time For Goal Achievement 09/05/17  Potential to Achieve Goals Good  OT Time Calculation  OT Start Time (ACUTE ONLY) 0830  OT Stop Time (ACUTE ONLY) 0859  OT Time Calculation (min) 29 min  OT General Charges  $OT Visit 1 Visit  OT Evaluation  $OT Eval Moderate Complexity 1 Mod  Written Expression  Dominant Hand Right  Maurie Boettcher, OT/L  OT Clinical Specialist [phone]

## 2017-08-22 NOTE — Progress Notes (Addendum)
[facility] TEAM 1 - Stepdown/ICU TEAM  KRISTELLE CAVALLARO  VFI:[id number] DOB: 1940-03-18 DOA: 08/18/2017 PCP: [employer]mond School, MD    Brief Narrative:  77 y/o F smoker (2ppd) with a history of Lupus, DM, RA on methotrexate, fibromyalgia, and early lung cancer status post stereotactic radiofrequency ablation who presented to [facility] ER on 8/2 with malaise and fatigue.  She was recently diagnosed with high-grade myelodysplastic syndrome and treated with Vidaza (azacitidine), with her last dose on 08/11/17.  She suffered at least two episodes of vomiting at home and two in the ER.   In the ED she was found to have a SBP of 89, a Hgb of 3.9, and platelets of 18. While in the ED she a witnessed episode of coffee ground emesis. The patient was transferred to [facility] for further evaluation.   Subjective: The patient is resting comfortably in a bedside chair.  She denies chest pain shortness of breath nausea vomiting or recurrent GI bleeding.  She reports that she feels weak in general.  She does state she has a good appetite.  Assessment & Plan:  ?Coffee Ground Emesis - possible GIB  Patient has a history of frequent NSAID use and is on chronic steroids - [facility] is following but is understandably hesitant to pursue EGD w/ such a low plt count - no clinical evidence of ongoing bleeding at this time - ?gastritis related to NSAIDs/steroids v/s MWT - cont empiric care for now - monitor Hgb - to have EGD once/if PLT count 50k or >   MDS with monosomy 7 abnormality - Profound anemia - Severe thrombocytopenia Care as per Oncology - plts transfused 8/2 and again on 8/4 - 1U PRBC transfused on 8/5 making a total of 4U PRBC thus far   Recent Labs  Lab 08/19/17 1059 08/20/17 0322 08/20/17 1952 08/21/17 0356 08/22/17 0322  PLT 19* 12* 36* 40* 31*   Recent Labs  Lab 08/19/17 1059 08/20/17 0322 08/20/17 1952 08/21/17 0356 08/22/17 0322  HGB 7.2* 7.1* 6.5* 7.9* 8.7*    Elevated LFT's - suspected shock  liver US w/o acute findings - LFTs steadily improving - viral hepatitis panel negative   Recent Labs  Lab 08/18/17 2153 08/19/17 1059 08/20/17 0322 08/21/17 0356 08/22/17 0322  AST 773* 697* 551* 264* 159*  ALT 599* 635* 617* 471* 371*  ALKPHOS 69 75 75 75 78  BILITOT 1.0 1.5* 1.5* 1.3* 1.2  PROT 5.5* 5.8* 5.7* 6.0* 6.2*  ALBUMIN 2.7* 2.8* 2.7* 2.7* 2.8*    Tobacco Abuse  Counseled on need to stop smoking   Symptomatic +UA Being treated empirically w/ a short course of oral abx  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  s/p XRT with repeat PET in 06/2017 that did not show hypermetabolic activity   Mild Hypotension Resolved   AKI  crt continues to improve  Recent Labs  Lab 08/19/17 0422 08/19/17 1059 08/20/17 0322 08/21/17 0356 08/22/17 0322  CREATININE 2.24* 2.13* 1.65* 1.19* 1.13*    Hyponatremia  Mild - Na stable   Lupus  Quiescent  RA  Quiescent   DM  CBG reasonably controlled   Fibromyalgia  DVT prophylaxis: SCDs Code Status: FULL CODE Family Communication: spoke with brother  Disposition Plan: stable for tele bed - PT/OT - awaiting port-a-cath insertion and possible EGD  Consultants:  PCCM Oncology  GI IR  Antimicrobials:  Cefdinir 8/4 >   Objective: Blood pressure (!) 149/77, pulse (!) 103, temperature (!) 97.5 F (36.4 C),  temperature source Oral, resp. rate 19, height 5\' 3"  (1.6 m), weight 64.1 kg (141 lb 5 oz), SpO2 99 %.  Intake/Output Summary (Last 24 hours) at 08/22/2017 0914 Last data filed at 08/22/2017 0800 Gross per 24 hour  Intake 744.3 ml  Output 1700 ml  Net -955.7 ml   Filed Weights   08/20/17 0333 08/21/17 0351 08/22/17 0500  Weight: 60.8 kg (134 lb 0.6 oz) 63.5 kg (139 lb 15.9 oz) 64.1 kg (141 lb 5 oz)    Examination: General: NAD Lungs: CTA B - no wheezing or crackles  Cardiovascular: RRR  Abdomen: NT/ND, soft, BS+, no rebound Extremities: No edema B LE   CBC: Recent Labs  Lab 08/18/17 2153  08/20/17 1952  08/21/17 0356 08/22/17 0322  WBC 4.9   < > 3.2* 3.1* 3.1*  NEUTROABS 3.6  --   --   --   --   HGB 4.3*   < > 6.5* 7.9* 8.7*  HCT 13.7*   < > 20.8* 24.6* 27.7*  MCV 105.4*   < > 102.5* 97.6 98.9  PLT 34*   < > 36* 40* 31*   < > = values in this interval not displayed.   Basic Metabolic Panel: Recent Labs  Lab 08/19/17 0422  08/20/17 0322 08/21/17 0356 08/22/17 0322  NA 136   < > 138 133* 133*  K 4.5   < > 4.0 3.4* 4.3  CL 103   < > 105 101 100  CO2 24   < > 23 25 26   GLUCOSE 115*   < > 101* 110* 140*  BUN 66*   < > 42* 20 12  CREATININE 2.24*   < > 1.65* 1.19* 1.13*  CALCIUM 8.3*   < > 8.3* 8.1* 8.3*  MG 2.4  --   --  1.7  --   PHOS 4.4  --   --   --   --    < > = values in this interval not displayed.   GFR: Estimated Creatinine Clearance: 38.2 mL/min (A) (by C-G formula based on SCr of 1.13 mg/dL (H)).  Liver Function Tests: Recent Labs  Lab 08/19/17 1059 08/20/17 0322 08/21/17 0356 08/22/17 0322  AST 697* 551* 264* 159*  ALT 635* 617* 471* 371*  ALKPHOS 75 75 75 78  BILITOT 1.5* 1.5* 1.3* 1.2  PROT 5.8* 5.7* 6.0* 6.2*  ALBUMIN 2.8* 2.7* 2.7* 2.8*    Coagulation Profile: Recent Labs  Lab 08/18/17 1509 08/20/17 0322 08/20/17 0832 08/21/17 0356 08/22/17 0322  INR 1.80 1.51 1.44 1.30 1.30    Cardiac Enzymes: Recent Labs  Lab 08/18/17 2153  TROPONINI 0.26*    HbA1C: Hgb A1c MFr Bld  Date/Time Value Ref Range Status  03/30/2015 09:14 PM 6.8 (H) 4.8 - 5.6 % Final    Comment:    (NOTE)         Pre-diabetes: 5.7 - 6.4         Diabetes: >6.4         Glycemic control for adults with diabetes: <7.0   10/18/2007 02:40 AM (H)  Final   6.2 (NOTE)   The ADA recommends the following therapeutic goal for glycemic   control related to Hgb A1C measurement:   Goal of Therapy:   < 7.0% Hgb A1C   Reference: American Diabetes Association: Clinical Practice   Recommendations 2008, Diabetes Care,  2008, 31:(Suppl 1).    CBG: Recent Labs  Lab 08/21/17 0704  08/21/17 1101 08/21/17 1644 08/21/17  2109 08/22/17 0812  GLUCAP 117* 209* 167* 163* 132*    Recent Results (from the past 240 hour(s))  MRSA PCR Screening     Status: None   Collection Time: 08/18/17  6:34 PM  Result Value Ref Range Status   MRSA by PCR NEGATIVE NEGATIVE Final    Comment:        The GeneXpert MRSA Assay (FDA approved for NASAL specimens only), is one component of a comprehensive MRSA colonization surveillance program. It is not intended to diagnose MRSA infection nor to guide or monitor treatment for MRSA infections. Performed at [facility], [place] [address], [place], [place] [address]   Culture, blood (routine x 2)     Status: None (Preliminary result)   Collection Time: 08/18/17  9:54 PM  Result Value Ref Range Status   Specimen Description BLOOD LEFT FOREARM  Final   Special Requests   Final    BOTTLES DRAWN AEROBIC AND ANAEROBIC Blood Culture adequate volume   Culture   Final    NO GROWTH 3 DAYS Performed at [facility], [place] [address] [place], [place] [address]    Report Status PENDING  Incomplete  Culture, blood (routine x 2)     Status: None (Preliminary result)   Collection Time: 08/18/17  9:54 PM  Result Value Ref Range Status   Specimen Description BLOOD LEFT HAND  Final   Special Requests   Final    BOTTLES DRAWN AEROBIC AND ANAEROBIC Blood Culture results may not be optimal due to an inadequate volume of blood received in culture bottles   Culture   Final    NO GROWTH 3 DAYS Performed at [facility], [place] [address] [place], [place] [address]    Report Status PENDING  Incomplete     Scheduled Meds: . cefdinir  300 mg Oral Q12H  . dronabinol  2.5 mg Oral TID AC  . insulin aspart  0-5 Units Subcutaneous QHS  . insulin aspart  0-9 Units Subcutaneous TID WC  . ipratropium-albuterol  3 mL Nebulization TID  . mouth rinse  15 mL Mouth Rinse BID  . metoprolol tartrate  12.5 mg Oral BID  . multivitamin  1 tablet Oral  Daily  . pantoprazole  40 mg Oral BID  . phenazopyridine  100 mg Oral TID WC  . sucralfate  1 g Oral TID WC & HS     LOS: 4 days   Cherene Altes, MD [facility] Office  [phone] Pager - Text Page per Shea Evans  If 7PM-7AM, please contact night-coverage per Amion 08/22/2017, 9:14 AM

## 2017-08-22 NOTE — Evaluation (Signed)
Physical Therapy Evaluation Patient Details Name: MAIRYN LENAHAN MRN: [id number] DOB: 1940/07/29 Today's Date: 08/22/2017   History of Present Illness  Pt is a 77 y.o. female admitted 77 y.o. 08/18/17 with malaise, fatigue, emesis; found to have anemia and AKI, worked up for upper GIB. PMH includes recent dx of high-grade myelodysplastic syndrome (last chemo on 08/11/17), DM, fibromyalgia, luncg CA, lupus, peripheral neuropathy, RA.    Clinical Impression  Pt presents with an overall decrease in functional mobility secondary to above. PTA, pt reports amb household distances with SPC and indep with ADLs; lives with son who assists with household tasks. Today, pt able to transfer and amb with bilat HHA and min-modA (+2 safety) to maintain balance. Limited by decreased activity tolerance and fatigue; c/o dizziness upon sitting (see vitals below). Pt would benefit from continued acute PT services to maximize functional mobility and independence prior to d/c with SNF-level therapies.  SpO2 >90% on RA HR 93-100s  Supine BP 121/84 Seated BP 142/61 Post-amb BP 137/56      Follow Up Recommendations SNF;Supervision/Assistance - 24 hour    Equipment Recommendations  (TBD)    Recommendations for Other Services       Precautions / Restrictions Precautions Precautions: Fall Restrictions Weight Bearing Restrictions: No      Mobility  Bed Mobility Overal bed mobility: Needs Assistance Bed Mobility: Supine to Sit     Supine to sit: +2 for safety/equipment;Mod assist     General bed mobility comments: ModA for UE support to assist trunk elevation and scoot bilat hips to EOB  Transfers Overall transfer level: Needs assistance   Transfers: Sit to/from Stand Sit to Stand: Min assist;+2 safety/equipment            Ambulation/Gait Ambulation/Gait assistance: Min assist;Mod assist;+2 safety/equipment Gait Distance (Feet): 15 Feet Assistive device: 2 person hand held assist Gait  Pattern/deviations: Step-to pattern;Drifts right/left;Trunk flexed Gait velocity: Decreased Gait velocity interpretation: <1.8 ft/sec, indicate of risk for recurrent falls General Gait Details: Slow, unsteady amb with bilat HHA and min-modA to maintain balance. 1x seated rest break on [facility]  Stairs            Wheelchair Mobility    Modified Rankin (Stroke Patients Only)       Balance Overall balance assessment: Needs assistance   Sitting balance-Leahy Scale: Fair       Standing balance-Leahy Scale: Poor Standing balance comment: Reliant on UE support and external assist                             Pertinent Vitals/Pain Pain Assessment: Faces Faces Pain Scale: Hurts a little bit Pain Location: Generalized  Pain Descriptors / Indicators: Discomfort Pain Intervention(s): Monitored during session    Home Living Family/patient expects to be discharged to:: Private residence Living Arrangements: Children Available Help at Discharge: Family;Available 24 hours/day Type of Home: House Home Access: Stairs to enter Entrance Stairs-Rails: Left Entrance Stairs-Number of Steps: 6 Home Layout: One level Home Equipment: Walker - 4 wheels;Transport chair;Cane - single point Additional Comments: Son available for 24/7 support, but has own medical/health issues and not able to provide as much assist    Prior Function Level of Independence: Needs assistance   Gait / Transfers Assistance Needed: Mod indep with household amb using SPC; rollator or transport chair for community. Son assists with stairs and tub transfers. Son drives. "Does not leave home much"  ADL's / Homemaking Assistance Needed: Pt reports  indep with ADLs. Son assists with household tasks  Comments: Pt with baseline vision impairments (macular degneration). Enjoys watching tv     Hand Dominance        Extremity/Trunk Assessment   Upper Extremity Assessment Upper Extremity Assessment: Generalized  weakness    Lower Extremity Assessment Lower Extremity Assessment: Generalized weakness    Cervical / Trunk Assessment Cervical / Trunk Assessment: Kyphotic  Communication   Communication: HOH  Cognition Arousal/Alertness: Awake/alert Behavior During Therapy: Flat affect Overall Cognitive Status: Impaired/Different from baseline Area of Impairment: Orientation;Attention;Memory;Following commands;Safety/judgement;Awareness;Problem solving                 Orientation Level: Disoriented to;Time Current Attention Level: Selective Memory: Decreased short-term memory Following Commands: Follows multi-step commands with increased time Safety/Judgement: Decreased awareness of deficits Awareness: Emergent Problem Solving: Requires verbal cues;Slow processing General Comments: Baseline hearing loss (does not have hearing aides). Likely baseline cognitive impairments      General Comments General comments (skin integrity, edema, etc.): SpO2 >90% on RA. HR 93-100s. Supine BP 121/84, seated BP 142/61, post-amb BP 137/56    Exercises     Assessment/Plan    PT Assessment Patient needs continued PT services  PT Problem List Decreased strength;Decreased activity tolerance;Decreased balance;Decreased mobility;Decreased cognition;Decreased knowledge of use of DME;Cardiopulmonary status limiting activity       PT Treatment Interventions DME instruction;Gait training;Stair training;Functional mobility training;Therapeutic activities;Therapeutic exercise;Balance training;Patient/family education    PT Goals (Current goals can be found in the Care Plan section)  Acute Rehab PT Goals Patient Stated Goal: Drink some water PT Goal Formulation: With patient Time For Goal Achievement: 09/05/17 Potential to Achieve Goals: Good    Frequency Min 2X/week   Barriers to discharge Decreased caregiver support      Co-evaluation PT/OT/SLP Co-Evaluation/Treatment: Yes Reason for  Co-Treatment: To address functional/ADL transfers PT goals addressed during session: Mobility/safety with mobility;Balance         AM-PAC PT "6 Clicks" Daily Activity  Outcome Measure Difficulty turning over in bed (including adjusting bedclothes, sheets and blankets)?: A Little Difficulty moving from lying on back to sitting on the side of the bed? : Unable Difficulty sitting down on and standing up from a chair with arms (e.g., wheelchair, bedside commode, etc,.)?: Unable Help needed moving to and from a bed to chair (including a wheelchair)?: A Little Help needed walking in hospital room?: A Little Help needed climbing 3-5 steps with a railing? : A Lot 6 Click Score: 13    End of Session Equipment Utilized During Treatment: Gait belt Activity Tolerance: Patient tolerated treatment well Patient left: in chair;with call bell/phone within reach;with family/visitor present Nurse Communication: Mobility status PT Visit Diagnosis: Other abnormalities of gait and mobility (R26.89);Muscle weakness (generalized) (M62.81)    Time: 9562-1308 PT Time Calculation (min) (ACUTE ONLY): 29 min   Charges:   PT Evaluation $PT Eval Moderate Complexity: 1 Mod         Mabeline Caras, PT, DPT Acute Rehab Services  Pager: [place] 08/22/2017, 9:59 AM

## 2017-08-22 NOTE — Progress Notes (Signed)
Patient ID: [name], female   DOB: 1940-11-22, 77 y.o.   MRN: [id number] Pt has a urine culture pending. We can proceed with port if urine culture is negative.

## 2017-08-23 ENCOUNTER — Inpatient Hospital Stay (HOSPITAL_COMMUNITY): Payer: Medicare Other

## 2017-08-23 DIAGNOSIS — R0603 Acute respiratory distress: Secondary | ICD-10-CM

## 2017-08-23 LAB — GLUCOSE, CAPILLARY
GLUCOSE-CAPILLARY: 145 mg/dL — AB (ref 70–99)
GLUCOSE-CAPILLARY: 105 mg/dL — AB (ref 70–99)
Glucose-Capillary: 189 mg/dL — ABNORMAL HIGH (ref 70–99)
Glucose-Capillary: 188 mg/dL — ABNORMAL HIGH (ref 70–99)

## 2017-08-23 LAB — COMPREHENSIVE METABOLIC PANEL
ALBUMIN: 2.7 g/dL — AB (ref 3.5–5.0)
ALK PHOS: 78 U/L (ref 38–126)
ALT: 229 U/L — AB (ref 0–44)
AST: 75 U/L — AB (ref 15–41)
Anion gap: 7 (ref 5–15)
BUN: 10 mg/dL (ref 8–23)
CALCIUM: 8.2 mg/dL — AB (ref 8.9–10.3)
CO2: 28 mmol/L (ref 22–32)
CREATININE: 1.1 mg/dL — AB (ref 0.44–1.00)
Chloride: 99 mmol/L (ref 98–111)
GFR calc Af Amer: 55 mL/min — ABNORMAL LOW (ref >60)
GFR calc non Af Amer: 48 mL/min — ABNORMAL LOW (ref >60)
GLUCOSE: 139 mg/dL — AB (ref 70–99)
Potassium: 4.2 mmol/L (ref 3.5–5.1)
SODIUM: 134 mmol/L — AB (ref 135–145)
Total Bilirubin: 0.8 mg/dL (ref 0.3–1.2)
Total Protein: 5.9 g/dL — ABNORMAL LOW (ref 6.5–8.1)

## 2017-08-23 LAB — CBC
HCT: 28.4 % — ABNORMAL LOW (ref 36.0–46.0)
HEMATOCRIT: 28.8 % — AB (ref 36.0–46.0)
HEMOGLOBIN: 8.9 g/dL — AB (ref 12.0–15.0)
Hemoglobin: 9.1 g/dL — ABNORMAL LOW (ref 12.0–15.0)
MCH: 31.3 pg (ref 26.0–34.0)
MCH: 31.8 pg (ref 26.0–34.0)
MCHC: 31.3 g/dL (ref 30.0–36.0)
MCHC: 31.6 g/dL (ref 30.0–36.0)
MCV: 100 fL (ref 78.0–100.0)
MCV: 100.7 fL — AB (ref 78.0–100.0)
PLATELETS: 21 10*3/uL — AB (ref 150–400)
PLATELETS: 34 10*3/uL — AB (ref 150–400)
RBC: 2.84 MIL/uL — ABNORMAL LOW (ref 3.87–5.11)
RBC: 2.86 MIL/uL — AB (ref 3.87–5.11)
RDW: 19.1 % — ABNORMAL HIGH (ref 11.5–15.5)
RDW: 19.2 % — ABNORMAL HIGH (ref 11.5–15.5)
WBC: 2.7 10*3/uL — ABNORMAL LOW (ref 4.0–10.5)
WBC: 3.6 10*3/uL — ABNORMAL LOW (ref 4.0–10.5)

## 2017-08-23 LAB — TYPE AND SCREEN
ABO/RH(D): O POS
Antibody Screen: NEGATIVE

## 2017-08-23 LAB — CULTURE, BLOOD (ROUTINE X 2)
CULTURE: NO GROWTH
CULTURE: NO GROWTH
Special Requests: ADEQUATE

## 2017-08-23 LAB — LACTATE DEHYDROGENASE: LDH: 170 U/L (ref 98–192)

## 2017-08-23 LAB — URINE CULTURE: Culture: NO GROWTH

## 2017-08-23 MED ORDER — FENTANYL CITRATE (PF) 100 MCG/2ML IJ SOLN
INTRAMUSCULAR | Status: AC
  Filled 2017-08-23: qty 4

## 2017-08-23 MED ORDER — HYPROMELLOSE (GONIOSCOPIC) 2.5 % OP SOLN
1.0000 [drp] | Freq: Three times a day (TID) | OPHTHALMIC | Status: DC | PRN
  Filled 2017-08-23: qty 15

## 2017-08-23 MED ORDER — CEFAZOLIN SODIUM-DEXTROSE 2-4 GM/100ML-% IV SOLN: 2.0000 g | INTRAVENOUS | Status: AC

## 2017-08-23 MED ORDER — LIDOCAINE HCL (PF) 1 % IJ SOLN
INTRAMUSCULAR | Status: DC | PRN
  Administered 2017-08-23: 5 mL

## 2017-08-23 MED ORDER — SODIUM CHLORIDE 0.9% IV SOLUTION
Freq: Once | INTRAVENOUS | Status: AC
  Administered 2017-08-23: 08:00:00 via INTRAVENOUS

## 2017-08-23 MED ORDER — LIDOCAINE HCL 1 % IJ SOLN
INTRAMUSCULAR | Status: AC
  Filled 2017-08-23: qty 20

## 2017-08-23 MED ORDER — MIDAZOLAM HCL 2 MG/2ML IJ SOLN
INTRAMUSCULAR | Status: AC
  Filled 2017-08-23: qty 4

## 2017-08-23 NOTE — Progress Notes (Signed)
Unfortunately, Ms. Spanbauer did not have any procedure done yesterday.  Her port was put off until the urine culture comes back.  If negative, then a port will be placed.  Gastroenterology did not want to do her endoscopy because of her thrombocytopenia.  I will go ahead and give her some platelets today.  From my perspective, the platelet count is above 30,000, she will be okay to have an endoscopy.  She is having no further bleeding.  She can probably be taken out of the ICU today.  She is on no active IV fluids.  She does have a little bit of a cough.  Is not productive of any sputum.  I think she is on some nebulizers.   Today, her white cell count is 2.7.  Hemoglobin 8.9.  Platelet count 21,000.  Her sodium is 134.  Potassium 4.2.  Her creatinine is 1.1.  She really needs to have the Port-A-Cath.  I suspect that she will need transfusions down the road.  She just does not have good peripheral IV access.  She is eating regular food right now.  I think this is encouraging.  Maybe, she will be moved out of the ICU today.  [name], MD  [address] [place] 5:7

## 2017-08-23 NOTE — Sedation Documentation (Signed)
Pt is in radiology nurses station. Pt denies any pain or discomfort at this time. Awaiting PAC placement. VSS

## 2017-08-23 NOTE — Progress Notes (Signed)
[place] TEAM 1 - Stepdown/ICU TEAM  [name]  MWU:[id number] DOB: 1940-12-19 DOA: 08/18/2017 PCP: Redmond School, MD    Brief Narrative:  77 y/o F smoker (2ppd) with a history of Lupus, DM, RA on methotrexate, fibromyalgia, and early lung cancer status post stereotactic radiofrequency ablation who presented to [facility] ER on 8/2 with malaise and fatigue.  She was recently diagnosed with high-grade myelodysplastic syndrome and treated with Vidaza (azacitidine), with her last dose on 08/11/17.  She suffered at least two episodes of vomiting at home and two in the ER.   In the ED she was found to have a SBP of 89, a Hgb of 3.9, and platelets of 18. While in the ED she a witnessed episode of coffee ground emesis. The patient was transferred to [facility] for further evaluation.   Subjective: She is s/p platelet transfusion  She denies chest pain shortness of breath nausea vomiting or recurrent GI bleeding.  She reports that she feels weak in general.  She does state she has a good appetite.  Assessment & Plan:  ?Coffee Ground Emesis - possible GIB  Patient has a history of frequent NSAID use and is on chronic steroids - [facility] is following but is understandably hesitant to pursue EGD w/ such a low plt count - patient received platelet transfusion today. According to Dr. Marin Olp  okay to proceed with EGD if platelet count is greater than 30,000.No clinical evidence of ongoing bleeding at this time - ?gastritis related to NSAIDs/steroids v/s MWT - cont empiric care for now - monitor Hgb - to have EGD once/if PLT count 50k or >   MDS with monosomy 7 abnormality - Profound anemia - Severe thrombocytopenia Care as per Oncology - plts transfused 8/2 and again on 8/4 ,  8/7- 1U PRBC transfused on 8/5 making a total of 4U PRBC thus far . Pending Port-A-Cath placement , urine culture negative, okay to proceed  Recent Labs  Lab 08/20/17 0322 08/20/17 1952 08/21/17 0356 08/22/17 0322 08/23/17 0350  PLT  12* 36* 40* 31* 21*   Recent Labs  Lab 08/20/17 0322 08/20/17 1952 08/21/17 0356 08/22/17 0322 08/23/17 0350  HGB 7.1* 6.5* 7.9* 8.7* 8.9*    Elevated LFT's - suspected shock liver US  without acute findings - LFTs steadily improving - viral hepatitis panel negative   Recent Labs  Lab 08/19/17 1059 08/20/17 0322 08/21/17 0356 08/22/17 0322 08/23/17 0350  AST 697* 551* 264* 159* 75*  ALT 635* 617* 471* 371* 229*  ALKPHOS 75 75 75 78 78  BILITOT 1.5* 1.5* 1.3* 1.2 0.8  PROT 5.8* 5.7* 6.0* 6.2* 5.9*  ALBUMIN 2.8* 2.7* 2.7* 2.8* 2.7*    Tobacco Abuse  Counseled on need to stop smoking   Symptomatic +UA Being treated empirically w/ a short course of oral abx  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  s/p XRT with repeat PET in 06/2017 that did not show hypermetabolic activity   Mild Hypotension Resolved   AKI  crt continues to improve ,peak 2.41>1.10   Recent Labs  Lab 08/19/17 1059 08/20/17 0322 08/21/17 0356 08/22/17 0322 08/23/17 0350  CREATININE 2.13* 1.65* 1.19* 1.13* 1.10*    Hyponatremia  Mild - Na stable   Lupus  Quiescent  RA  Quiescent   DM  CBG reasonably controlled   Fibromyalgia  DVT prophylaxis: SCDs Code Status: FULL CODE Family Communication: spoke with brother  Disposition Plan: stable for tele bed - PT/OT - awaiting port-a-cath insertion  and possible EGD  Consultants:  PCCM Oncology  GI IR  Antimicrobials:  Anti-infectives (From admission, onward)   Start     Dose/Rate Route Frequency Ordered Stop   08/23/17 0930  ceFAZolin (ANCEF) IVPB 2g/100 mL premix     2 g 200 mL/hr over 30 Minutes Intravenous On call 08/23/17 0922 08/24/17 0930   08/22/17 0600  ceFAZolin (ANCEF) IVPB 2g/100 mL premix     2 g 200 mL/hr over 30 Minutes Intravenous To Radiology 08/21/17 1218 08/22/17 1236   08/20/17 1430  cefdinir (OMNICEF) capsule 300 mg     300 mg Oral Every 12 hours 08/20/17 1415 08/22/17 2120        Objective: Blood  pressure (!) 122/42, pulse 97, temperature (!) 97.3 F (36.3 C), resp. rate (!) 25, height 5\' 3"  (1.6 m), weight 64.1 kg (141 lb 5 oz), SpO2 96 %.  Intake/Output Summary (Last 24 hours) at 08/23/2017 1004 Last data filed at 08/23/2017 0900 Gross per 24 hour  Intake 480 ml  Output 2100 ml  Net -1620 ml   Filed Weights   08/21/17 0351 08/22/17 0500 08/23/17 0500  Weight: 63.5 kg (139 lb 15.9 oz) 64.1 kg (141 lb 5 oz) 64.1 kg (141 lb 5 oz)    Examination: General: NAD Lungs: CTA B - no wheezing or crackles  Cardiovascular: RRR  Abdomen: NT/ND, soft, BS+, no rebound Extremities: No edema B LE   CBC: Recent Labs  Lab 08/18/17 2153  08/21/17 0356 08/22/17 0322 08/23/17 0350  WBC 4.9   < > 3.1* 3.1* 2.7*  NEUTROABS 3.6  --   --   --   --   HGB 4.3*   < > 7.9* 8.7* 8.9*  HCT 13.7*   < > 24.6* 27.7* 28.4*  MCV 105.4*   < > 97.6 98.9 100.0  PLT 34*   < > 40* 31* 21*   < > = values in this interval not displayed.   Basic Metabolic Panel: Recent Labs  Lab 08/19/17 0422  08/21/17 0356 08/22/17 0322 08/23/17 0350  NA 136   < > 133* 133* 134*  K 4.5   < > 3.4* 4.3 4.2  CL 103   < > 101 100 99  CO2 24   < > 25 26 28   GLUCOSE 115*   < > 110* 140* 139*  BUN 66*   < > 20 12 10   CREATININE 2.24*   < > 1.19* 1.13* 1.10*  CALCIUM 8.3*   < > 8.1* 8.3* 8.2*  MG 2.4  --  1.7  --   --   PHOS 4.4  --   --   --   --    < > = values in this interval not displayed.   GFR: Estimated Creatinine Clearance: 39.2 mL/min (A) (by C-G formula based on SCr of 1.1 mg/dL (H)).  Liver Function Tests: Recent Labs  Lab 08/20/17 0322 08/21/17 0356 08/22/17 0322 08/23/17 0350  AST 551* 264* 159* 75*  ALT 617* 471* 371* 229*  ALKPHOS 75 75 78 78  BILITOT 1.5* 1.3* 1.2 0.8  PROT 5.7* 6.0* 6.2* 5.9*  ALBUMIN 2.7* 2.7* 2.8* 2.7*    Coagulation Profile: Recent Labs  Lab 08/18/17 1509 08/20/17 0322 08/20/17 0832 08/21/17 0356 08/22/17 0322  INR 1.80 1.51 1.44 1.30 1.30    Cardiac  Enzymes: Recent Labs  Lab 08/18/17 2153  TROPONINI 0.26*    HbA1C: Hgb A1c MFr Bld  Date/Time Value Ref Range Status  03/30/2015 09:14 PM 6.8 (H) 4.8 - 5.6 % Final    Comment:    (NOTE)         Pre-diabetes: 5.7 - 6.4         Diabetes: >6.4         Glycemic control for adults with diabetes: <7.0   10/18/2007 02:40 AM (H)  Final   6.2 (NOTE)   The ADA recommends the following therapeutic goal for glycemic   control related to Hgb A1C measurement:   Goal of Therapy:   < 7.0% Hgb A1C   Reference: American Diabetes Association: Clinical Practice   Recommendations 2008, Diabetes Care,  2008, 31:(Suppl 1).    CBG: Recent Labs  Lab 08/22/17 0812 08/22/17 1222 08/22/17 1614 08/22/17 2113 08/23/17 0712  GLUCAP 132* 108* 143* 168* 145*    Recent Results (from the past 240 hour(s))  MRSA PCR Screening     Status: None   Collection Time: 08/18/17  6:34 PM  Result Value Ref Range Status   MRSA by PCR NEGATIVE NEGATIVE Final    Comment:        The GeneXpert MRSA Assay (FDA approved for NASAL specimens only), is one component of a comprehensive MRSA colonization surveillance program. It is not intended to diagnose MRSA infection nor to guide or monitor treatment for MRSA infections. Performed at [facility], [place] [address], [place], [place] [address]   Culture, blood (routine x 2)     Status: None (Preliminary result)   Collection Time: 08/18/17  9:54 PM  Result Value Ref Range Status   Specimen Description BLOOD LEFT FOREARM  Final   Special Requests   Final    BOTTLES DRAWN AEROBIC AND ANAEROBIC Blood Culture adequate volume   Culture   Final    NO GROWTH 4 DAYS Performed at [facility], [place] [address], [place], [place] [address]    Report Status PENDING  Incomplete  Culture, blood (routine x 2)     Status: None (Preliminary result)   Collection Time: 08/18/17  9:54 PM  Result Value Ref Range Status   Specimen Description BLOOD LEFT HAND  Final    Special Requests   Final    BOTTLES DRAWN AEROBIC AND ANAEROBIC Blood Culture results may not be optimal due to an inadequate volume of blood received in culture bottles   Culture   Final    NO GROWTH 4 DAYS Performed at [facility], [place] [address] [place], [place] [address]    Report Status PENDING  Incomplete  Urine Culture     Status: None   Collection Time: 08/22/17 10:36 AM  Result Value Ref Range Status   Specimen Description URINE, CATHETERIZED  Final   Special Requests NONE  Final   Culture   Final    NO GROWTH Performed at [facility] [address], [place], [place] [address]    Report Status 08/23/2017 FINAL  Final     Scheduled Meds: . dronabinol  2.5 mg Oral TID AC  . insulin aspart  0-5 Units Subcutaneous QHS  . insulin aspart  0-9 Units Subcutaneous TID WC  . ipratropium-albuterol  3 mL Nebulization TID  . mouth rinse  15 mL Mouth Rinse BID  . metoprolol tartrate  12.5 mg Oral BID  . multivitamin  1 tablet Oral Daily  . pantoprazole  40 mg Oral BID  . sucralfate  1 g Oral TID WC & HS  LOS: 5 days   Cherene Altes, MD [facility] Office  [phone] Pager - Text Page per Amion  If 7PM-7AM, please contact night-coverage per Amion 08/23/2017, 10:04 AM

## 2017-08-23 NOTE — Care Management Important Message (Signed)
Important Message  Patient Details  Name: [name] MRN: [id number] Date of Birth: [birth year]   Medicare Important Message Given:  Yes    Orbie Pyo 08/23/2017, 3:35 PM

## 2017-08-23 NOTE — Procedures (Signed)
Placement of right basilic vein PICC.  Tip at SVC/RA junction.  Minimal blood loss and no immediate complication.  PICC is ready to use.

## 2017-08-23 NOTE — Plan of Care (Signed)

## 2017-08-23 NOTE — Progress Notes (Signed)
Patient ID: [name], female   DOB: 09-Oct-1940, 77 y.o.   MRN: [id number] Patient is being evaluated for port placement.  Platelet counts remains low after transfusion and patient is still in ICU and needing frequent blood draws and currently has multiple peripheral IVs.  Dr. Marin Olp was not available to discuss central venous access.  I discussed situation with Dr. Allyson Sabal with the Hospitalist service.  Placing a port is not ideal with platelet count and patient's current peripheral access needs.  Felt best option was to place a PICC line for now and patient can keep as outpatient until a port can be placed with lower risks of bleeding.

## 2017-08-24 LAB — BPAM PLATELET PHERESIS
Blood Product Expiration Date: 201908082359
ISSUE DATE / TIME: 201908070740
Unit Type and Rh: 5100

## 2017-08-24 LAB — COMPREHENSIVE METABOLIC PANEL
ALT: 153 U/L — ABNORMAL HIGH (ref 0–44)
ANION GAP: 8 (ref 5–15)
AST: 38 U/L (ref 15–41)
Albumin: 2.8 g/dL — ABNORMAL LOW (ref 3.5–5.0)
Alkaline Phosphatase: 76 U/L (ref 38–126)
BUN: 10 mg/dL (ref 8–23)
CALCIUM: 8.5 mg/dL — AB (ref 8.9–10.3)
CO2: 26 mmol/L (ref 22–32)
Chloride: 99 mmol/L (ref 98–111)
Creatinine, Ser: 0.94 mg/dL (ref 0.44–1.00)
GFR, EST NON AFRICAN AMERICAN: 57 mL/min — AB (ref >60)
Glucose, Bld: 158 mg/dL — ABNORMAL HIGH (ref 70–99)
Potassium: 4.1 mmol/L (ref 3.5–5.1)
Sodium: 133 mmol/L — ABNORMAL LOW (ref 135–145)
TOTAL PROTEIN: 6.1 g/dL — AB (ref 6.5–8.1)
Total Bilirubin: 0.9 mg/dL (ref 0.3–1.2)

## 2017-08-24 LAB — CBC WITH DIFFERENTIAL/PLATELET
Abs Immature Granulocytes: 0 10*3/uL (ref 0.0–0.1)
BASOS ABS: 0 10*3/uL (ref 0.0–0.1)
BASOS PCT: 0 %
Eosinophils Absolute: 0.1 10*3/uL (ref 0.0–0.7)
Eosinophils Relative: 2 %
HCT: 28.8 % — ABNORMAL LOW (ref 36.0–46.0)
Hemoglobin: 9.1 g/dL — ABNORMAL LOW (ref 12.0–15.0)
Immature Granulocytes: 1 %
Lymphocytes Relative: 30 %
Lymphs Abs: 0.8 10*3/uL (ref 0.7–4.0)
MCH: 31.2 pg (ref 26.0–34.0)
MCHC: 31.6 g/dL (ref 30.0–36.0)
MCV: 98.6 fL (ref 78.0–100.0)
Monocytes Absolute: 0.2 10*3/uL (ref 0.1–1.0)
Monocytes Relative: 6 %
NEUTROS PCT: 61 %
Neutro Abs: 1.5 10*3/uL — ABNORMAL LOW (ref 1.7–7.7)
PLATELETS: 27 10*3/uL — AB (ref 150–400)
RBC: 2.92 MIL/uL — AB (ref 3.87–5.11)
RDW: 18.9 % — AB (ref 11.5–15.5)
WBC: 2.5 10*3/uL — AB (ref 4.0–10.5)

## 2017-08-24 LAB — CBC
HCT: 28 % — ABNORMAL LOW (ref 36.0–46.0)
Hemoglobin: 8.7 g/dL — ABNORMAL LOW (ref 12.0–15.0)
MCH: 31.6 pg (ref 26.0–34.0)
MCHC: 31.1 g/dL (ref 30.0–36.0)
MCV: 101.8 fL — AB (ref 78.0–100.0)
Platelets: 29 10*3/uL — CL (ref 150–400)
RBC: 2.75 MIL/uL — ABNORMAL LOW (ref 3.87–5.11)
RDW: 19.2 % — ABNORMAL HIGH (ref 11.5–15.5)
WBC: 2.7 10*3/uL — ABNORMAL LOW (ref 4.0–10.5)

## 2017-08-24 LAB — GLUCOSE, CAPILLARY
GLUCOSE-CAPILLARY: 140 mg/dL — AB (ref 70–99)
GLUCOSE-CAPILLARY: 207 mg/dL — AB (ref 70–99)
Glucose-Capillary: 209 mg/dL — ABNORMAL HIGH (ref 70–99)
Glucose-Capillary: 144 mg/dL — ABNORMAL HIGH (ref 70–99)

## 2017-08-24 LAB — PREPARE PLATELET PHERESIS: Unit division: 0

## 2017-08-24 LAB — LACTATE DEHYDROGENASE: LDH: 172 U/L (ref 98–192)

## 2017-08-24 NOTE — Progress Notes (Signed)
PROGRESS NOTE    [name]  BOF:[id number] DOB: [birth year] DOA: 08/18/2017 PCP: Redmond School, MD    Brief Narrative: 77 y/o F smoker (2ppd) with a history of Lupus, DM, RA on methotrexate, fibromyalgia, and early lung cancer status post stereotactic radiofrequency ablation who presented to [facility] ER on 8/2 with malaise and fatigue. She was recently diagnosed with high-grade myelodysplastic syndrome and treated with Vidaza (azacitidine), with her last dose on 08/11/17. She was found to be hypotensive, severely anemic and thrombocytopenic. She was transferred to [facility] for evaluation of anemia and thrombocytopenia.   Assessment & Plan:   Active Problems:   Upper GI bleed   Thrombocytopenia (HCC)   MDS (myelodysplastic syndrome) (HCC)   Anemia   Weakness   Acute blood loss anemia   Abnormal LFTs   Coffee ground emesis/ possible upper GI bleed.  She has a h/o NSAID USE and is on chronic steroids. Possibly gastritis on top of the MDS.  She underwent 4 units of PRBC transfusion and 1 unit of platelet transfusion.  GI consulted, recommended EGD when platelets improve to >50,000. No clinical evidence of ongoing bleeding at this time.     MDS with Monosomy 7 abnormality: With profound anemia and thrombocytopenia:  S/p 4units of prbc transfusion and 1 unit of platelet transfusion.    Elevated liver function panel:  Improving.  She denies any nausea, vomiting or abdominal pain at this time.    UTI: Completed a course of oral antibiotics.    Non small cell lung CA:  s/p XRT with repeat PET in 06/2017 that did not show hypermetabolic activity  Hypotension:  Resolved.    Diabetes mellitus:  CBG (last 3)  Recent Labs    08/23/17 2207 08/24/17 0728 08/24/17 1123  GLUCAP 188* 140* 207*   Resume SSI.   AKI:  Suspect from hypotension, hypovolemia.  Improved.    Hyponatremia: resolved with hydration.    Lupus and RA:  Quiescent.    DVT prophylaxis: scd's Code  Status: full code.  Family Communication: none at bedside.  Disposition Plan: pending clinical improvement and improvement in the counts.    Consultants:   Gastroenterology  Oncology Dr Marin Olp.    Procedures: none.    Antimicrobials: completed omnicef.    Subjective: She denies any nausea, vomiting or abdominal pain.  Objective: Vitals:   08/24/17 0900 08/24/17 1000 08/24/17 1100 08/24/17 1124  BP: 140/61 (!) 111/43 126/63   Pulse: 92 100 (!) 107   Resp: (!) 23 20 (!) 28   Temp:    98.1 F (36.7 C)  TempSrc:    Oral  SpO2: 94% 91% 97%   Weight:      Height:        Intake/Output Summary (Last 24 hours) at 08/24/2017 1232 Last data filed at 08/24/2017 0800 Gross per 24 hour  Intake 270 ml  Output 200 ml  Net 70 ml   Filed Weights   08/22/17 0500 08/23/17 0500 08/24/17 0400  Weight: 64.1 kg 64.1 kg 62.2 kg    Examination:  General exam: Appears calm and comfortable  Respiratory system: Clear to auscultation. Respiratory effort normal. Cardiovascular system: S1 & S2 heard, RRR. No JVD,  Gastrointestinal system: Abdomen is nondistended, soft and nontender.Normal bowel sounds heard. Central nervous system: Alert and oriented. No focal neurological deficits. Extremities: Symmetric 5 x 5 power. Skin: No rashes, lesions or ulcers Psychiatry:Mood & affect appropriate.     Data Reviewed: I have personally reviewed following labs and  imaging studies  CBC: Recent Labs  Lab 08/18/17 2153  08/22/17 0322 08/23/17 0350 08/23/17 1021 08/24/17 0249 08/24/17 0633  WBC 4.9   < > 3.1* 2.7* 3.6* 2.7* 2.5*  NEUTROABS 3.6  --   --   --   --   --  1.5*  HGB 4.3*   < > 8.7* 8.9* 9.1* 8.7* 9.1*  HCT 13.7*   < > 27.7* 28.4* 28.8* 28.0* 28.8*  MCV 105.4*   < > 98.9 100.0 100.7* 101.8* 98.6  PLT 34*   < > 31* 21* 34* 29* 27*   < > = values in this interval not displayed.   Basic Metabolic Panel: Recent Labs  Lab 08/19/17 0422  08/20/17 0322 08/21/17 0356  08/22/17 0322 08/23/17 0350 08/24/17 0249  NA 136   < > 138 133* 133* 134* 133*  K 4.5   < > 4.0 3.4* 4.3 4.2 4.1  CL 103   < > 105 101 100 99 99  CO2 24   < > 23 25 26 28 26   GLUCOSE 115*   < > 101* 110* 140* 139* 158*  BUN 66*   < > 42* 20 12 10 10   CREATININE 2.24*   < > 1.65* 1.19* 1.13* 1.10* 0.94  CALCIUM 8.3*   < > 8.3* 8.1* 8.3* 8.2* 8.5*  MG 2.4  --   --  1.7  --   --   --   PHOS 4.4  --   --   --   --   --   --    < > = values in this interval not displayed.   GFR: Estimated Creatinine Clearance: 42.1 mL/min (by C-G formula based on SCr of 0.94 mg/dL). Liver Function Tests: Recent Labs  Lab 08/20/17 0322 08/21/17 0356 08/22/17 0322 08/23/17 0350 08/24/17 0249  AST 551* 264* 159* 75* 38  ALT 617* 471* 371* 229* 153*  ALKPHOS 75 75 78 78 76  BILITOT 1.5* 1.3* 1.2 0.8 0.9  PROT 5.7* 6.0* 6.2* 5.9* 6.1*  ALBUMIN 2.7* 2.7* 2.8* 2.7* 2.8*   No results for input(s): LIPASE, AMYLASE in the last 168 hours. No results for input(s): AMMONIA in the last 168 hours. Coagulation Profile: Recent Labs  Lab 08/18/17 1509 08/20/17 0322 08/20/17 0832 08/21/17 0356 08/22/17 0322  INR 1.80 1.51 1.44 1.30 1.30   Cardiac Enzymes: Recent Labs  Lab 08/18/17 2153  TROPONINI 0.26*   BNP (last 3 results) No results for input(s): PROBNP in the last 8760 hours. HbA1C: No results for input(s): HGBA1C in the last 72 hours. CBG: Recent Labs  Lab 08/23/17 1119 08/23/17 1745 08/23/17 2207 08/24/17 0728 08/24/17 1123  GLUCAP 189* 105* 188* 140* 207*   Lipid Profile: No results for input(s): CHOL, HDL, LDLCALC, TRIG, CHOLHDL, LDLDIRECT in the last 72 hours. Thyroid Function Tests: No results for input(s): TSH, T4TOTAL, FREET4, T3FREE, THYROIDAB in the last 72 hours. Anemia Panel: No results for input(s): VITAMINB12, FOLATE, FERRITIN, TIBC, IRON, RETICCTPCT in the last 72 hours. Sepsis Labs: Recent Labs  Lab 08/18/17 2153 08/19/17 0422  LATICACIDVEN 2.4* 0.9     Recent Results (from the past 240 hour(s))  MRSA PCR Screening     Status: None   Collection Time: 08/18/17  6:34 PM  Result Value Ref Range Status   MRSA by PCR NEGATIVE NEGATIVE Final    Comment:        The GeneXpert MRSA Assay (FDA approved for NASAL specimens only), is one component of  a comprehensive MRSA colonization surveillance program. It is not intended to diagnose MRSA infection nor to guide or monitor treatment for MRSA infections. Performed at [facility], [place] [address], [place], [place] [address]   Culture, blood (routine x 2)     Status: None   Collection Time: 08/18/17  9:54 PM  Result Value Ref Range Status   Specimen Description BLOOD LEFT FOREARM  Final   Special Requests   Final    BOTTLES DRAWN AEROBIC AND ANAEROBIC Blood Culture adequate volume   Culture   Final    NO GROWTH 5 DAYS Performed at [facility], [place] [address] [place], [place] [address]    Report Status 08/23/2017 FINAL  Final  Culture, blood (routine x 2)     Status: None   Collection Time: 08/18/17  9:54 PM  Result Value Ref Range Status   Specimen Description BLOOD LEFT HAND  Final   Special Requests   Final    BOTTLES DRAWN AEROBIC AND ANAEROBIC Blood Culture results may not be optimal due to an inadequate volume of blood received in culture bottles   Culture   Final    NO GROWTH 5 DAYS Performed at [facility], [place] [address] [place], [place] [address]    Report Status 08/23/2017 FINAL  Final  Urine Culture     Status: None   Collection Time: 08/22/17 10:36 AM  Result Value Ref Range Status   Specimen Description URINE, CATHETERIZED  Final   Special Requests NONE  Final   Culture   Final    NO GROWTH Performed at [facility], [place] [address] [place], [place] [address]    Report Status 08/23/2017 FINAL  Final         Radiology Studies: Ir Picc Placement Right >5 Yrs Inc Img Guide  Result Date: 08/23/2017 INDICATION: 77 year old with  myelodysplastic syndrome. Port-A-Cath was requested but due to patient's thrombocytopenia and multiple venous access needs, we decided to place a PICC line for now. EXAM: RIGHT ARM PICC LINE PLACEMENT WITH ULTRASOUND AND FLUOROSCOPIC GUIDANCE MEDICATIONS: None ANESTHESIA/SEDATION: None FLUOROSCOPY TIME:  Fluoroscopy Time: 18 seconds, 2 mGy COMPLICATIONS: None immediate. PROCEDURE: The patient was advised of the possible risks and complications and agreed to undergo the procedure. The patient was then brought to the angiographic suite for the procedure. The right arm was prepped with chlorhexidine, draped in the usual sterile fashion using maximum barrier technique (cap and mask, sterile gown, sterile gloves, large sterile sheet, hand hygiene and cutaneous antiseptic). Local anesthesia was attained by infiltration with 1% lidocaine. Ultrasound demonstrated patency of the right basilic vein, and this was documented with an image. Under real-time ultrasound guidance, this vein was accessed with a 21 gauge micropuncture needle and image documentation was performed. The needle was exchanged over a guidewire for a peel-away sheath through which a 37 cm 5 [place] dual lumen power injectable PICC was advanced, and positioned with its tip at the lower SVC/right atrial junction. Fluoroscopy during the procedure and fluoro spot radiograph confirms appropriate catheter position. The catheter was flushed, secured to the skin, and covered with a sterile dressing. IMPRESSION: Successful placement of a right arm PICC with sonographic and fluoroscopic guidance. The catheter is ready for use. Electronically Signed   By: Markus Daft M.D.   On: 08/23/2017 17:16        Scheduled Meds: . dronabinol  2.5 mg Oral TID AC  . insulin aspart  0-5 Units  Subcutaneous QHS  . insulin aspart  0-9 Units Subcutaneous TID WC  . ipratropium-albuterol  3 mL Nebulization TID  . mouth rinse  15 mL Mouth Rinse BID  . metoprolol tartrate  12.5 mg  Oral BID  . multivitamin  1 tablet Oral Daily  . pantoprazole  40 mg Oral BID  . sucralfate  1 g Oral TID WC & HS   Continuous Infusions:   LOS: 6 days    Time spent: 35 min    Hosie Poisson, MD [facility] Pager [phone]   If 7PM-7AM, please contact night-coverage [email or web address] Password [facility] 08/24/2017, 12:32 PM

## 2017-08-24 NOTE — Progress Notes (Signed)
Physical Therapy Treatment Patient Details Name: [name] MRN: [id number] DOB: [birth year] Today's Date: 08/24/2017    History of Present Illness Pt is a 77 y.o. female admitted 77 y.o. 08/18/17 with malaise, fatigue, emesis; found to have anemia and AKI, worked up for upper GIB. Awaiting portacath placement. PMH includes recent dx of high-grade myelodysplastic syndrome (last chemo on 08/11/17), DM, fibromyalgia, luncg CA, lupus, peripheral neuropathy, RA.   PT Comments    Pt progressing with mobility. Performs transfers and amb with significant forward flexed posture; increased time spent working on pre-gait activity with multimodal cues to encourage upright posture and decrease risk for anterior LOB. Pt c/o L hip "catching" resulting in knee instability. Requires close min guard to minA to maintain balance with mobility. Increased fatigue throughout session; per family, pt got very little sleep overnight. Discussed recommendation for SNF-level therapies; family in agreement. Will follow acutely.    Follow Up Recommendations  SNF;Supervision/Assistance - 24 hour     Equipment Recommendations  (TBD)    Recommendations for Other Services       Precautions / Restrictions Precautions Precautions: Fall Restrictions Weight Bearing Restrictions: No    Mobility  Bed Mobility Overal bed mobility: Needs Assistance Bed Mobility: Supine to Sit     Supine to sit: Mod assist     General bed mobility comments: ModA for UE support to assist trunk elevation; increased time and cues to scoot hips to EOB  Transfers Overall transfer level: Needs assistance Equipment used: Rolling walker (2 wheeled) Transfers: Sit to/from Stand Sit to Stand: Min assist         General transfer comment: Performed 5x sit<>stand total from varying surface heights (bed, chair, BSC) with min guard to minA for balance; repeated cues for correct hand placement  Ambulation/Gait Ambulation/Gait assistance:  Min assist Gait Distance (Feet): 5 Feet   Gait Pattern/deviations: Step-to pattern;Decreased weight shift to left;Trunk flexed Gait velocity: Decreased Gait velocity interpretation: <1.8 ft/sec, indicate of risk for recurrent falls General Gait Details: Performed pre-gait activity marching in place with multimodal cues to encourage upright posture; L knee instability, pt reports "hip catching". Amb from bed>chair>BSC>chair with RW and close min guard for balance    Stairs             Wheelchair Mobility    Modified Rankin (Stroke Patients Only)       Balance Overall balance assessment: Needs assistance   Sitting balance-Leahy Scale: Fair       Standing balance-Leahy Scale: Poor Standing balance comment: Reliant on UE support                            Cognition Arousal/Alertness: Awake/alert Behavior During Therapy: Flat affect Overall Cognitive Status: Impaired/Different from baseline Area of Impairment: Attention;Memory;Following commands;Safety/judgement;Awareness;Problem solving                   Current Attention Level: Selective Memory: Decreased short-term memory Following Commands: Follows multi-step commands with increased time Safety/Judgement: Decreased awareness of deficits Awareness: Emergent Problem Solving: Requires verbal cues;Slow processing General Comments: Baseline hearing loss (does not have hearing aides). Likely baseline cognitive impairments      Exercises      General Comments General comments (skin integrity, edema, etc.): Granddaughter Advice worker) present throughout session      Pertinent Vitals/Pain Pain Assessment: Faces Faces Pain Scale: Hurts a little bit Pain Location: L hip "catch" Pain Descriptors / Indicators: Discomfort Pain Intervention(s): Monitored during  session    Home Living                      Prior Function            PT Goals (current goals can now be found in the care plan  section) Acute Rehab PT Goals Patient Stated Goal: Get some sleep PT Goal Formulation: With patient Time For Goal Achievement: 09/05/17 Potential to Achieve Goals: Good Progress towards PT goals: Progressing toward goals    Frequency    Min 2X/week      PT Plan Current plan remains appropriate    Co-evaluation              AM-PAC PT "6 Clicks" Daily Activity  Outcome Measure  Difficulty turning over in bed (including adjusting bedclothes, sheets and blankets)?: A Little Difficulty moving from lying on back to sitting on the side of the bed? : Unable Difficulty sitting down on and standing up from a chair with arms (e.g., wheelchair, bedside commode, etc,.)?: Unable Help needed moving to and from a bed to chair (including a wheelchair)?: A Little Help needed walking in hospital room?: A Little Help needed climbing 3-5 steps with a railing? : A Lot 6 Click Score: 13    End of Session Equipment Utilized During Treatment: Gait belt Activity Tolerance: Patient tolerated treatment well Patient left: in chair;with call bell/phone within reach;with chair alarm set;with nursing/sitter in room Nurse Communication: Mobility status PT Visit Diagnosis: Other abnormalities of gait and mobility (R26.89);Muscle weakness (generalized) (M62.81)     Time: 7253-6644 PT Time Calculation (min) (ACUTE ONLY): 39 min  Charges:  $Gait Training: 8-22 mins $Therapeutic Activity: 23-37 mins                     Mabeline Caras, PT, DPT Acute Rehab Services  Pager: [place] 08/24/2017, 9:01 AM

## 2017-08-24 NOTE — Progress Notes (Signed)
CRITICAL VALUE ALERT  Critical Value: platelet of 27  Date & Time Notied:  08/24/17 at 0900  Provider Notified: Karleen Hampshire   Orders Received/Actions taken: no orders. MD aware of the results.

## 2017-08-24 NOTE — Plan of Care (Signed)

## 2017-08-25 LAB — GLUCOSE, CAPILLARY
GLUCOSE-CAPILLARY: 148 mg/dL — AB (ref 70–99)
GLUCOSE-CAPILLARY: 169 mg/dL — AB (ref 70–99)
GLUCOSE-CAPILLARY: 129 mg/dL — AB (ref 70–99)
Glucose-Capillary: 166 mg/dL — ABNORMAL HIGH (ref 70–99)

## 2017-08-25 LAB — CBC WITH DIFFERENTIAL/PLATELET
ABS IMMATURE GRANULOCYTES: 0 10*3/uL (ref 0.0–0.1)
BASOS PCT: 1 %
Basophils Absolute: 0 10*3/uL (ref 0.0–0.1)
Eosinophils Absolute: 0.1 10*3/uL (ref 0.0–0.7)
Eosinophils Relative: 3 %
HCT: 27.9 % — ABNORMAL LOW (ref 36.0–46.0)
Hemoglobin: 8.7 g/dL — ABNORMAL LOW (ref 12.0–15.0)
IMMATURE GRANULOCYTES: 0 %
Lymphocytes Relative: 34 %
Lymphs Abs: 0.8 10*3/uL (ref 0.7–4.0)
MCH: 31.5 pg (ref 26.0–34.0)
MCHC: 31.2 g/dL (ref 30.0–36.0)
MCV: 101.1 fL — ABNORMAL HIGH (ref 78.0–100.0)
Monocytes Absolute: 0.1 10*3/uL (ref 0.1–1.0)
Monocytes Relative: 6 %
NEUTROS ABS: 1.3 10*3/uL — AB (ref 1.7–7.7)
Neutrophils Relative %: 56 %
PLATELETS: 19 10*3/uL — AB (ref 150–400)
RBC: 2.76 MIL/uL — ABNORMAL LOW (ref 3.87–5.11)
RDW: 19 % — ABNORMAL HIGH (ref 11.5–15.5)
WBC: 2.3 10*3/uL — ABNORMAL LOW (ref 4.0–10.5)

## 2017-08-25 LAB — COMPREHENSIVE METABOLIC PANEL
ALT: 109 U/L — AB (ref 0–44)
AST: 27 U/L (ref 15–41)
Albumin: 2.8 g/dL — ABNORMAL LOW (ref 3.5–5.0)
Alkaline Phosphatase: 71 U/L (ref 38–126)
Anion gap: 9 (ref 5–15)
BUN: 11 mg/dL (ref 8–23)
CHLORIDE: 101 mmol/L (ref 98–111)
CO2: 26 mmol/L (ref 22–32)
CREATININE: 1.02 mg/dL — AB (ref 0.44–1.00)
Calcium: 8.8 mg/dL — ABNORMAL LOW (ref 8.9–10.3)
GFR calc non Af Amer: 52 mL/min — ABNORMAL LOW (ref >60)
Glucose, Bld: 168 mg/dL — ABNORMAL HIGH (ref 70–99)
Potassium: 3.9 mmol/L (ref 3.5–5.1)
Sodium: 136 mmol/L (ref 135–145)
Total Bilirubin: 1 mg/dL (ref 0.3–1.2)
Total Protein: 6.1 g/dL — ABNORMAL LOW (ref 6.5–8.1)

## 2017-08-25 LAB — LACTATE DEHYDROGENASE: LDH: 161 U/L (ref 98–192)

## 2017-08-25 MED ORDER — TBO-FILGRASTIM 480 MCG/0.8ML ~~LOC~~ SOSY
480.0000 ug | PREFILLED_SYRINGE | Freq: Every day | SUBCUTANEOUS | Status: DC
  Administered 2017-08-25 – 2017-08-28 (×4): 480 ug via SUBCUTANEOUS
  Filled 2017-08-25 (×6): qty 0.8

## 2017-08-25 MED ORDER — ROMIPLOSTIM 250 MCG ~~LOC~~ SOLR
2.0000 ug/kg | Freq: Once | SUBCUTANEOUS | Status: AC
  Administered 2017-08-25: 130 ug via SUBCUTANEOUS
  Filled 2017-08-25: qty 0.26

## 2017-08-25 MED ORDER — SODIUM CHLORIDE 0.9% FLUSH
10.0000 mL | INTRAVENOUS | Status: DC | PRN
  Administered 2017-08-26 (×2): 10 mL
  Administered 2017-08-27 (×2): 20 mL
  Filled 2017-08-25 (×4): qty 40

## 2017-08-25 MED ORDER — SENNOSIDES-DOCUSATE SODIUM 8.6-50 MG PO TABS
2.0000 | ORAL_TABLET | Freq: Two times a day (BID) | ORAL | Status: DC
  Administered 2017-08-25 – 2017-08-26 (×3): 2 via ORAL
  Filled 2017-08-25 (×3): qty 2

## 2017-08-25 MED ORDER — NICOTINE 21 MG/24HR TD PT24
21.0000 mg | MEDICATED_PATCH | Freq: Every day | TRANSDERMAL | Status: DC
  Administered 2017-08-26: 21 mg via TRANSDERMAL
  Filled 2017-08-25 (×2): qty 1

## 2017-08-25 MED ORDER — CHLORHEXIDINE GLUCONATE CLOTH 2 % EX PADS
6.0000 | MEDICATED_PAD | Freq: Every day | CUTANEOUS | Status: DC
  Administered 2017-08-25 – 2017-08-29 (×5): 6 via TOPICAL

## 2017-08-25 NOTE — NC FL2 (Signed)
[place] LEVEL OF CARE SCREENING TOOL     IDENTIFICATION  Patient Name: [name] Birthdate: 04-Oct-1940 Sex: female Admission Date (Current Location): 08/18/2017  [facility] and [place] Number:  [employer] and Address:  [facility], [place] [address], [place], [place] [address]      Provider Number: [id number]  Attending Physician Name and Address:  Hosie Poisson, MD  Relative Name and Phone Number:  Pandora Leiter [phone]    Current Level of Care: Hospital Recommended Level of Care: [place] Prior Approval Number:    Date Approved/Denied:   PASRR Number: [id number]  Discharge Plan: SNF    Current Diagnoses: Patient Active Problem List   Diagnosis Date Noted  . Acute blood loss anemia   . Abnormal LFTs   . Anemia 08/18/2017  . Weakness   . MDS (myelodysplastic syndrome) ([place]) 07/31/2017  . Iron deficiency anemia due to chronic blood loss 06/21/2017  . Primary malignant neoplasm of left upper lobe of lung ([place]) 08/11/2015  . Candida esophagitis ([place]) 07/01/2015  . Rectal bleeding 04/30/2015  . Colitis, acute 04/30/2015  . Viral gastroenteritis 04/01/2015  . Fracture of 5th metatarsal   . Fracture of 4th metatarsal   . Fracture of 3rd metatarsal   . Fracture of 2nd metatarsal   . Sepsis ([place]) 03/31/2015  . Thrombocytopenia ([place]) 03/31/2015  . AKI (acute kidney injury) ([place]) 03/31/2015  . Upper GI bleed   . Leukocytosis   . Malignant neoplasm of right main bronchus ([place]) 02/12/2015  . Thrombocytopenia, unspecified ([place]) 09/04/2013  . Normocytic anemia 09/04/2013  . Diabetes mellitus, type 2 ([place]) 09/03/2013  . Essential hypertension 09/03/2013  . Tobacco dependence 09/03/2013  . Shoulder pain 03/16/2011  . Stiffness of joint, not elsewhere classified,  shoulder region 03/16/2011  . Muscle weakness (generalized) 03/16/2011  . Rheumatoid arthritis ([place]) 08/16/2010    Orientation RESPIRATION  BLADDER Height & Weight     Time, Self, Situation, Place  Normal Continent Weight: 65 kg Height:  5\' 3"  (160 cm)  BEHAVIORAL SYMPTOMS/MOOD NEUROLOGICAL BOWEL NUTRITION STATUS  (NA)   Continent Diet(Please see DC Summary)  AMBULATORY STATUS COMMUNICATION OF NEEDS Skin   Limited Assist Verbally Normal                       Personal Care Assistance Level of Assistance  Bathing, Feeding, Dressing Bathing Assistance: Limited assistance Feeding assistance: Independent Dressing Assistance: Limited assistance     Functional Limitations Info  Sight, Hearing, Speech Sight Info: Impaired Hearing Info: Impaired Speech Info: Adequate    SPECIAL CARE FACTORS FREQUENCY  PT (By licensed PT), OT (By licensed OT)     PT Frequency: 5x/week OT Frequency: 3x/week            Contractures Contractures Info: Not present    Additional Factors Info  Code Status, Allergies, Insulin Sliding Scale Code Status Info: Full Allergies Info: Tape, Bupropion, Lopid  Gemfibrozil   Insulin Sliding Scale Info: 3x daily with meals and at bedtime       Current Medications (08/25/2017):  This is the current hospital active medication list Current Facility-Administered Medications  Medication Dose Route Frequency Provider Last Rate Last Dose  . acetaminophen (TYLENOL) tablet 500 mg  500 mg Oral Q6H PRN Joette Catching T, MD      . albuterol (PROVENTIL) (2.5 MG/3ML) 0.083% nebulizer solution 2.5 mg  2.5 mg Nebulization Q2H PRN Cherene Altes, MD  2.5 mg at 08/24/17 2305  . ALPRAZolam Duanne Moron) tablet 0.25 mg  0.25 mg Oral QHS PRN Schorr, Rhetta Mura, NP   0.25 mg at 08/24/17 0215  . Chlorhexidine Gluconate Cloth 2 % PADS 6 each  6 each Topical Q0600 Hosie Poisson, MD   6 each at 08/25/17 [phone]  . dronabinol (MARINOL) capsule 2.5 mg  2.5 mg Oral TID AC Volanda Napoleon, MD   2.5 mg at 08/25/17 0916  . hydroxypropyl methylcellulose / hypromellose (ISOPTO TEARS / GONIOVISC) 2.5 % ophthalmic solution 1  drop  1 drop Both Eyes TID PRN Reyne Dumas, MD      . insulin aspart (novoLOG) injection 0-5 Units  0-5 Units Subcutaneous QHS Cherene Altes, MD   3 Units at 08/20/17 2000  . insulin aspart (novoLOG) injection 0-9 Units  0-9 Units Subcutaneous TID WC Cherene Altes, MD   1 Units at 08/25/17 [phone]  . ipratropium-albuterol (DUONEB) 0.5-2.5 (3) MG/3ML nebulizer solution 3 mL  3 mL Nebulization TID Cherene Altes, MD   3 mL at 08/25/17 0840  . lidocaine (PF) (XYLOCAINE) 1 % injection    PRN Markus Daft, MD   5 mL at 08/23/17 1632  . MEDLINE mouth rinse  15 mL Mouth Rinse BID Scatliffe, Rise Paganini, MD   15 mL at 08/24/17 2137  . metoprolol tartrate (LOPRESSOR) tablet 12.5 mg  12.5 mg Oral BID Joette Catching T, MD   12.5 mg at 08/25/17 0915  . multivitamin (PROSIGHT) tablet 1 tablet  1 tablet Oral Daily Scatliffe, Rise Paganini, MD   1 tablet at 08/25/17 0917  . ondansetron (ZOFRAN) injection 4 mg  4 mg Intravenous Q6H PRN Ollis, Brandi L, NP   4 mg at 08/21/17 1701  . pantoprazole (PROTONIX) EC tablet 40 mg  40 mg Oral BID Cherene Altes, MD   40 mg at 08/25/17 0916  . romiPLOStim (NPLATE) injection 130 mcg  2 mcg/kg Subcutaneous Once Volanda Napoleon, MD      . sodium chloride flush (NS) 0.9 % injection 10-40 mL  10-40 mL Intracatheter PRN Hosie Poisson, MD      . sucralfate (CARAFATE) 1 GM/10ML suspension 1 g  1 g Oral TID WC & HS Volanda Napoleon, MD   1 g at 08/25/17 0917  . Tbo-Filgrastim (GRANIX) injection 480 mcg  480 mcg Subcutaneous q1800 Volanda Napoleon, MD       Facility-Administered Medications Ordered in Other Encounters  Medication Dose Route Frequency Provider Last Rate Last Dose  . 0.9 %  sodium chloride infusion   Intravenous Continuous Hurley Cisco, MD 20 mL/hr at 10/04/10 1339       Discharge Medications: Please see discharge summary for a list of discharge medications.  Relevant Imaging Results:  Relevant Lab Results:   Additional Information SSN: [place], [place]

## 2017-08-25 NOTE — Progress Notes (Signed)
OT Cancellation Note  Patient Details Name: KIELA SHISLER MRN: [id number] DOB: 1940/02/15   Cancelled Treatment:    Reason Eval/Treat Not Completed: Medical issues which prohibited therapy.  Will hold visit due to mult labs that prohibit tx including platelets of 19.  Will try again at another time as pt is more medically stable.  Hortencia Pilar 08/25/2017, 8:57 AM

## 2017-08-25 NOTE — Clinical Social Work Note (Signed)
Clinical Social Work Assessment  Patient Details  Name: [name] MRN: [id number] Date of Birth: 10-Sep-1940  Date of referral:  08/25/17               Reason for consult:  Facility Placement                Permission sought to share information with:  Facility Sport and exercise psychologist, Family Supports Permission granted to share information::  Yes, Verbal Permission Granted  Name::        Agency::  SNFs  Relationship::  Son  Contact Information:  [phone]  Housing/Transportation Living arrangements for the past 2 months:  Single Family Home Source of Information:  Adult Children Patient Interpreter Needed:  None Criminal Activity/Legal Involvement Pertinent to Current Situation/Hospitalization:  No - Comment as needed Significant Relationships:  Adult Children Lives with:  Self, Adult Children Do you feel safe going back to the place where you live?  No Need for family participation in patient care:  Yes (Comment)  Care giving concerns:  CSW received consult for possible SNF placement at time of discharge. CSW spoke with patient and son at bedside regarding PT recommendation of SNF placement at time of discharge. Patient was very lethargic and did not respond to CSW. Patient's son reported that he is unable to care for patient at their home given patient's current physical needs and fall risk. Patient's son expressed understanding of PT recommendation and is agreeable to SNF placement at time of discharge. CSW to continue to follow and assist with discharge planning needs.   Social Worker assessment / plan:  CSW spoke with patient's son concerning possibility of rehab at [facility] before returning home.  Employment status:  Retired Forensic scientist:  Medicare PT Recommendations:  [place] / Referral to community resources:  [place]  Patient/Family's Response to care:  Patient's son recognizes need for rehab before returning home  and is agreeable to a SNF in [place]. Patient's son reported preference for somewhere in [place] first and then [place] if needed.  Patient/Family's Understanding of and Emotional Response to Diagnosis, Current Treatment, and Prognosis:  Patient/family is realistic regarding therapy needs and expressed being hopeful for SNF placement. Patient's son expressed understanding of CSW role and discharge process as well as medical condition. No questions/concerns about plan or treatment.    Emotional Assessment Appearance:  Appears stated age Attitude/Demeanor/Rapport:  Unable to Assess Affect (typically observed):  Unable to Assess Orientation:  Oriented to Self, Oriented to Place, Oriented to  Time, Oriented to Situation Alcohol / Substance use:  Not Applicable Psych involvement (Current and /or in the community):  No (Comment)  Discharge Needs  Concerns to be addressed:  Care Coordination Readmission within the last 30 days:  No Current discharge risk:  Cognitively Impaired, Dependent with Mobility Barriers to Discharge:  Continued Medical Work up   [employer], [place] 08/25/2017, 11:30 AM

## 2017-08-25 NOTE — Progress Notes (Signed)
[name] is out of the ICU now.  She is up on 5 W.  She is doing okay.  She had a PICC line placed.  She is not happy about having a PICC line in.  She finds it to be a little bit aggravating.  I told her that this is the best thing that we can do right now in order for her to be given products and for not to have IVs placed all the time.  Hopefully, down the road, she will be able to have a Port-A-Cath placed.  Her CBC today shows a white cell count of 2.3.  Hemoglobin 8.7.  Platelet count 19,000.  I am going to try some Neupogen.  I will also try her on a dose of Nplate to see if this can help get her white cells and platelets up a little bit more.  She might have a little bit of a better appetite.  Her daughter-in-law was with her this morning.  I told her to please bring in whatever [name] would like to eat.  She is had no bleeding.  Is been no hematemesis or melena/hematochezia.  I am not sure when she will be discharged back to [facility].  I would think when she is discharged, then she could be considered for therapy for the myelodysplasia.  [name], MD  Psalm 91:14

## 2017-08-25 NOTE — Progress Notes (Signed)
PT Cancellation Note  Patient Details Name: [name] MRN: [id number] DOB: 1940/12/06   Cancelled Treatment:    Reason Eval/Treat Not Completed: Medical issues which prohibited therapy.  Will hold visit due to mult labs that prohibit tx including platelets of 19.  Will try again at another time as pt is more medically stable.   Ramond Dial 08/25/2017, 8:10 AM   Mee Hives, PT MS Acute Rehab Dept. Number: [place] and [place]

## 2017-08-25 NOTE — Progress Notes (Signed)
PROGRESS NOTE    QUINCEY NORED  BMW:[id number] DOB: [birth year] DOA: 08/18/2017 PCP: Redmond School, MD    Brief Narrative: 77 y/o F smoker (2ppd) with a history of Lupus, DM, RA on methotrexate, fibromyalgia, and early lung cancer status post stereotactic radiofrequency ablation who presented to [facility] ER on 8/2 with malaise and fatigue. She was recently diagnosed with high-grade myelodysplastic syndrome and treated with Vidaza (azacitidine), with her last dose on 08/11/17. She was found to be hypotensive, severely anemic and thrombocytopenic. She was transferred to [facility] for evaluation of anemia and thrombocytopenia.   Assessment & Plan:   Active Problems:   Upper GI bleed   Thrombocytopenia (HCC)   MDS (myelodysplastic syndrome) (HCC)   Anemia   Weakness   Acute blood loss anemia   Abnormal LFTs   Coffee ground emesis/ possible upper GI bleed.  She has a h/o NSAID USE and is on chronic steroids. Possibly gastritis on top of the MDS.  She underwent 4 units of PRBC transfusion and 1 unit of platelet transfusion.  GI consulted, recommended EGD when platelets improve to >50,000. No clinical evidence of ongoing bleeding at this time.  No nausea, vomiting or hematemesis or hematochezia today.    MDS with Monosomy 7 abnormality: With profound anemia and thrombocytopenia:  S/p 4units of prbc transfusion and 1 unit of platelet transfusion.  Platelet count, hemoglobin  and wbc count continue to decline, oncology added on Granix and N plate today.  Repeat CBC with differential in am.    Elevated liver function panel:  Improving.  She denies any nausea, vomiting or abdominal pain at this time.    UTI: Completed a course of oral antibiotics.    Non small cell lung CA:  s/p XRT with repeat PET in 06/2017 that did not show hypermetabolic activity  Hypotension:  Resolved.    Diabetes mellitus:  CBG (last 3)  Recent Labs    08/25/17 0813 08/25/17 1224 08/25/17 1642  GLUCAP  148* 166* 169*   Resume SSI. No change in meds.   AKI: Suspect from hypotension, hypovolemia.  Improved. Repeat renal parameters in am.    Hyponatremia: resolved with hydration.    Lupus and RA:  Quiescent.    Tobacco abuse:  Nicotine patch added.   Hypertension; bp parameters are sub optimal. Prn hydralazine on board.    DVT prophylaxis: scd's Code Status: full code.  Family Communication: family at bedside.  Disposition Plan: pending clinical improvement and improvement in the counts.    Consultants:   Gastroenterology  Oncology Dr Marin Olp.    Procedures: none.    Antimicrobials: completed omnicef.    Subjective: She denies any nausea, vomiting or abdominal pain. Wants a nicotine patch.   Objective: Vitals:   08/25/17 0502 08/25/17 0503 08/25/17 0840 08/25/17 1447  BP: (!) 184/65 (!) 162/69    Pulse: (!) 105 (!) 103    Resp: 16     Temp: 97.9 F (36.6 C)     TempSrc: Oral     SpO2: 93%  95% 98%  Weight:  65 kg    Height:        Intake/Output Summary (Last 24 hours) at 08/25/2017 1701 Last data filed at 08/25/2017 1536 Gross per 24 hour  Intake 500 ml  Output 2100 ml  Net -1600 ml   Filed Weights   08/23/17 0500 08/24/17 0400 08/25/17 0503  Weight: 64.1 kg 62.2 kg 65 kg    Examination:  General exam: Appears calm and comfortable  not in distress.  Respiratory system: Clear to auscultation. Respiratory effort normal. No wheezing or rhonchi.  Cardiovascular system: S1 & S2 heard, RRR. No JVD,  Gastrointestinal system: Abdomen is soft NT ND BS+ Central nervous system: Alert and oriented. Non focal.  Extremities: no pedal edema, cyanosis or clubbing.  Skin: No rashes, lesions or ulcers Psychiatry:Mood & affect appropriate.     Data Reviewed: I have personally reviewed following labs and imaging studies  CBC: Recent Labs  Lab 08/18/17 2153  08/23/17 0350 08/23/17 1021 08/24/17 0249 08/24/17 0633 08/25/17 0359  WBC 4.9   < > 2.7*  3.6* 2.7* 2.5* 2.3*  NEUTROABS 3.6  --   --   --   --  1.5* 1.3*  HGB 4.3*   < > 8.9* 9.1* 8.7* 9.1* 8.7*  HCT 13.7*   < > 28.4* 28.8* 28.0* 28.8* 27.9*  MCV 105.4*   < > 100.0 100.7* 101.8* 98.6 101.1*  PLT 34*   < > 21* 34* 29* 27* 19*   < > = values in this interval not displayed.   Basic Metabolic Panel: Recent Labs  Lab 08/19/17 0422  08/21/17 0356 08/22/17 0322 08/23/17 0350 08/24/17 0249 08/25/17 0359  NA 136   < > 133* 133* 134* 133* 136  K 4.5   < > 3.4* 4.3 4.2 4.1 3.9  CL 103   < > 101 100 99 99 101  CO2 24   < > 25 26 28 26 26   GLUCOSE 115*   < > 110* 140* 139* 158* 168*  BUN 66*   < > 20 12 10 10 11   CREATININE 2.24*   < > 1.19* 1.13* 1.10* 0.94 1.02*  CALCIUM 8.3*   < > 8.1* 8.3* 8.2* 8.5* 8.8*  MG 2.4  --  1.7  --   --   --   --   PHOS 4.4  --   --   --   --   --   --    < > = values in this interval not displayed.   GFR: Estimated Creatinine Clearance: 42.5 mL/min (A) (by C-G formula based on SCr of 1.02 mg/dL (H)). Liver Function Tests: Recent Labs  Lab 08/21/17 0356 08/22/17 0322 08/23/17 0350 08/24/17 0249 08/25/17 0359  AST 264* 159* 75* 38 27  ALT 471* 371* 229* 153* 109*  ALKPHOS 75 78 78 76 71  BILITOT 1.3* 1.2 0.8 0.9 1.0  PROT 6.0* 6.2* 5.9* 6.1* 6.1*  ALBUMIN 2.7* 2.8* 2.7* 2.8* 2.8*   No results for input(s): LIPASE, AMYLASE in the last 168 hours. No results for input(s): AMMONIA in the last 168 hours. Coagulation Profile: Recent Labs  Lab 08/20/17 0322 08/20/17 0832 08/21/17 0356 08/22/17 0322  INR 1.51 1.44 1.30 1.30   Cardiac Enzymes: Recent Labs  Lab 08/18/17 2153  TROPONINI 0.26*   BNP (last 3 results) No results for input(s): PROBNP in the last 8760 hours. HbA1C: No results for input(s): HGBA1C in the last 72 hours. CBG: Recent Labs  Lab 08/24/17 1653 08/24/17 2110 08/25/17 0813 08/25/17 1224 08/25/17 1642  GLUCAP 209* 144* 148* 166* 169*   Lipid Profile: No results for input(s): CHOL, HDL, LDLCALC, TRIG,  CHOLHDL, LDLDIRECT in the last 72 hours. Thyroid Function Tests: No results for input(s): TSH, T4TOTAL, FREET4, T3FREE, THYROIDAB in the last 72 hours. Anemia Panel: No results for input(s): VITAMINB12, FOLATE, FERRITIN, TIBC, IRON, RETICCTPCT in the last 72 hours. Sepsis Labs: Recent Labs  Lab 08/18/17 2153 08/19/17  0422  LATICACIDVEN 2.4* 0.9    Recent Results (from the past 240 hour(s))  MRSA PCR Screening     Status: None   Collection Time: 08/18/17  6:34 PM  Result Value Ref Range Status   MRSA by PCR NEGATIVE NEGATIVE Final    Comment:        The GeneXpert MRSA Assay (FDA approved for NASAL specimens only), is one component of a comprehensive MRSA colonization surveillance program. It is not intended to diagnose MRSA infection nor to guide or monitor treatment for MRSA infections. Performed at [facility], [place] [address], [place], [place] [address]   Culture, blood (routine x 2)     Status: None   Collection Time: 08/18/17  9:54 PM  Result Value Ref Range Status   Specimen Description BLOOD LEFT FOREARM  Final   Special Requests   Final    BOTTLES DRAWN AEROBIC AND ANAEROBIC Blood Culture adequate volume   Culture   Final    NO GROWTH 5 DAYS Performed at [facility], [place] [address] [place], [place] [address]    Report Status 08/23/2017 FINAL  Final  Culture, blood (routine x 2)     Status: None   Collection Time: 08/18/17  9:54 PM  Result Value Ref Range Status   Specimen Description BLOOD LEFT HAND  Final   Special Requests   Final    BOTTLES DRAWN AEROBIC AND ANAEROBIC Blood Culture results may not be optimal due to an inadequate volume of blood received in culture bottles   Culture   Final    NO GROWTH 5 DAYS Performed at [facility], [place] [address], [place], [place] [address]    Report Status 08/23/2017 FINAL  Final  Urine Culture     Status: None   Collection Time: 08/22/17 10:36 AM  Result Value Ref Range Status   Specimen  Description URINE, CATHETERIZED  Final   Special Requests NONE  Final   Culture   Final    NO GROWTH Performed at [facility], [place] [address], [place],  [address]    Report Status 08/23/2017 FINAL  Final         Radiology Studies: No results found.      Scheduled Meds: . Chlorhexidine Gluconate Cloth  6 each Topical Q0600  . dronabinol  2.5 mg Oral TID AC  . insulin aspart  0-5 Units Subcutaneous QHS  . insulin aspart  0-9 Units Subcutaneous TID WC  . ipratropium-albuterol  3 mL Nebulization TID  . mouth rinse  15 mL Mouth Rinse BID  . metoprolol tartrate  12.5 mg Oral BID  . multivitamin  1 tablet Oral Daily  . nicotine  21 mg Transdermal Daily  . pantoprazole  40 mg Oral BID  . senna-docusate  2 tablet Oral BID  . sucralfate  1 g Oral TID WC & HS  . Tbo-Filgrastim  480 mcg Subcutaneous q1800   Continuous Infusions:   LOS: 7 days    Time spent: 35 min    Hosie Poisson, MD [facility] Pager [phone]   If 7PM-7AM, please contact night-coverage [email or web address] Password [email or web address] 08/25/2017, 5:01 PM

## 2017-08-26 LAB — COMPREHENSIVE METABOLIC PANEL
ALBUMIN: 2.8 g/dL — AB (ref 3.5–5.0)
ALT: 81 U/L — ABNORMAL HIGH (ref 0–44)
ANION GAP: 8 (ref 5–15)
AST: 22 U/L (ref 15–41)
Alkaline Phosphatase: 75 U/L (ref 38–126)
BUN: 11 mg/dL (ref 8–23)
CHLORIDE: 100 mmol/L (ref 98–111)
CO2: 25 mmol/L (ref 22–32)
Calcium: 8.7 mg/dL — ABNORMAL LOW (ref 8.9–10.3)
Creatinine, Ser: 0.9 mg/dL (ref 0.44–1.00)
GFR calc Af Amer: >60 mL/min (ref >60)
GFR calc non Af Amer: >60 mL/min (ref >60)
GLUCOSE: 151 mg/dL — AB (ref 70–99)
POTASSIUM: 3.8 mmol/L (ref 3.5–5.1)
SODIUM: 133 mmol/L — AB (ref 135–145)
TOTAL PROTEIN: 6.3 g/dL — AB (ref 6.5–8.1)
Total Bilirubin: 1.7 mg/dL — ABNORMAL HIGH (ref 0.3–1.2)

## 2017-08-26 LAB — CBC WITH DIFFERENTIAL/PLATELET
Abs Immature Granulocytes: 0.1 10*3/uL (ref 0.0–0.1)
BASOS ABS: 0 10*3/uL (ref 0.0–0.1)
Basophils Relative: 1 %
EOS ABS: 0.1 10*3/uL (ref 0.0–0.7)
EOS PCT: 1 %
HCT: 29.7 % — ABNORMAL LOW (ref 36.0–46.0)
Hemoglobin: 9.5 g/dL — ABNORMAL LOW (ref 12.0–15.0)
IMMATURE GRANULOCYTES: 1 %
Lymphocytes Relative: 10 %
Lymphs Abs: 0.8 10*3/uL (ref 0.7–4.0)
MCH: 31.8 pg (ref 26.0–34.0)
MCHC: 32 g/dL (ref 30.0–36.0)
MCV: 99.3 fL (ref 78.0–100.0)
Monocytes Absolute: 0.4 10*3/uL (ref 0.1–1.0)
Monocytes Relative: 5 %
NEUTROS PCT: 82 %
Neutro Abs: 7.1 10*3/uL (ref 1.7–7.7)
PLATELETS: 15 10*3/uL — AB (ref 150–400)
RBC: 2.99 MIL/uL — AB (ref 3.87–5.11)
RDW: 19 % — AB (ref 11.5–15.5)
WBC: 8.5 10*3/uL (ref 4.0–10.5)

## 2017-08-26 LAB — GLUCOSE, CAPILLARY
GLUCOSE-CAPILLARY: 168 mg/dL — AB (ref 70–99)
Glucose-Capillary: 163 mg/dL — ABNORMAL HIGH (ref 70–99)
Glucose-Capillary: 172 mg/dL — ABNORMAL HIGH (ref 70–99)
Glucose-Capillary: 147 mg/dL — ABNORMAL HIGH (ref 70–99)

## 2017-08-26 LAB — LACTATE DEHYDROGENASE: LDH: 170 U/L (ref 98–192)

## 2017-08-26 MED ORDER — BISACODYL 5 MG PO TBEC
10.0000 mg | DELAYED_RELEASE_TABLET | Freq: Every day | ORAL | Status: DC
Start: 2017-08-26 — End: 2017-08-29
  Administered 2017-08-26: 10 mg via ORAL
  Filled 2017-08-26: qty 2

## 2017-08-26 NOTE — Progress Notes (Signed)
PROGRESS NOTE    [name]  PNT:[id number] DOB: [birth year] DOA: 08/18/2017 PCP: Redmond School, MD Brief Narrative:77 y/o F smoker (2ppd) with a history of Lupus, DM, RA on methotrexate, fibromyalgia, and early lung cancer status post stereotactic radiofrequency ablation who presented to [facility] ER on 8/2 with malaise and fatigue. She was recently diagnosed with high-grade myelodysplastic syndrome and treated with Vidaza (azacitidine), with her last dose on 08/11/17. She was found to be hypotensive, severely anemic and thrombocytopenic. She was transferred to [facility] for evaluation of anemia and thrombocytopenia.   Assessment & Plan:   Active Problems:   Upper GI bleed   Thrombocytopenia (HCC)   MDS (myelodysplastic syndrome) (HCC)   Anemia   Weakness   Acute blood loss anemia   Abnormal LFTs   Coffee ground emesis/ possible upper GI bleed.  She has a h/o NSAID USE and is on chronic steroids. Possibly gastritis on top of the MDS.  She underwent 4 units of PRBC transfusion and 1 unit of platelet transfusion.  GI consulted, recommended EGD when platelets improve to >50,000. No clinical evidence of ongoing bleeding at this time.  No nausea, vomiting or hematemesis or hematochezia today.  Her hemoglobin is stable.  No further vomiting or hematemesis.  She is also constipated.  MDS with Monosomy 7 abnormality: With profound anemia and thrombocytopenia:  S/p 4units of prbc transfusion and 1 unit of platelet transfusion.  Platelet count, hemoglobin  and wbc count continue to decline, oncology added on Granix and platelets yesterday. Repeat CBC with differential in am.    Elevated liver function panel:  Improving.  She denies any nausea, vomiting or abdominal pain at this time.    UTI: Completed a course of oral antibiotics.   Non small cell lung CA:  s/p XRT with repeat PET in 06/2017 that did not show hypermetabolic activity  Hypotension:  Resolved.    Diabetes  mellitus: Patient took Amaryl 4 mg daily, glipizide 5 mg daily and metformin 500 mg twice a day at home which has not been restarted since she is eating much.  Continue SSI and restart home medications as indicated. CBG (last 3)  RecentLabs(last2labs)       Recent Labs    08/25/17 0813 08/25/17 1224 08/25/17 1642  GLUCAP 148* 166* 169*      AKI: Suspect from hypotension, hypovolemia in the setting of metformin ACE inhibitors and NSAIDs at home.  As well as methotrexate at home.  Creatinine today 0.90.  Hyponatremia: resolved with hydration.  Mild hyponatremia at 133.  Follow-up daily.   Lupus and RA:  Quiescent.    Tobacco abuse:  Nicotine patch added  Hypertension; restart Lopressor 25 mg twice a day that she was taking at home.  Constipation continue senna or Dulcolax added.   DVT prophylaxisS CD Code Status: DO NOT RESUSCITATE Family Communication: Discussed with granddaughter who was at the bedside  Disposition Plan: Patient lives at home with her son who is not there 24 hours a day.  PT recommends SNF.  Consultants:  GI and oncology   Procedures: None  Antimicrobials: None  Subjective: Patient complains of constipation granddaughter by the bedside has a lot of questions she wants to speak with oncologist.  I did discuss the CODE STATUS with the patient in front of the granddaughter and patient wants to be DO NOT RESUSCITATE.    Objective: Vitals:   08/25/17 2140 08/26/17 0500 08/26/17 0537 08/26/17 0801  BP: 124/75  (!) 146/56   Pulse: Marland Kitchen)  104  (!) 102   Resp: 16  18   Temp: 98.1 F (36.7 C)  98.7 F (37.1 C)   TempSrc: Oral  Oral   SpO2: 95%  95% 94%  Weight:  62.2 kg    Height:        Intake/Output Summary (Last 24 hours) at 08/26/2017 0944 Last data filed at 08/26/2017 0541 Gross per 24 hour  Intake 500 ml  Output 200 ml  Net 300 ml   Filed Weights   08/24/17 0400 08/25/17 0503 08/26/17 0500  Weight: 62.2 kg 65 kg 62.2 kg     Examination:  General exam: Appears calm and comfortable  Respiratory system: Clear to auscultation. Respiratory effort normal. Cardiovascular system: S1 & S2 heard, RRR. No JVD, murmurs, rubs, gallops or clicks. No pedal edema. Gastrointestinal system: Abdomen is nondistended, soft and nontender. No organomegaly or masses felt. Normal bowel sounds heard. Central nervous system: Alert and oriented. No focal neurological deficits. Extremities: Symmetric 5 x 5 power. Skin: No rashes, lesions or ulcers Psychiatry: Judgement and insight appear normal. Mood & affect appropriate.     Data Reviewed: I have personally reviewed following labs and imaging studies  CBC: Recent Labs  Lab 08/23/17 1021 08/24/17 0249 08/24/17 0633 08/25/17 0359 08/26/17 0349  WBC 3.6* 2.7* 2.5* 2.3* 8.5  NEUTROABS  --   --  1.5* 1.3* 7.1  HGB 9.1* 8.7* 9.1* 8.7* 9.5*  HCT 28.8* 28.0* 28.8* 27.9* 29.7*  MCV 100.7* 101.8* 98.6 101.1* 99.3  PLT 34* 29* 27* 19* 15*   Basic Metabolic Panel: Recent Labs  Lab 08/21/17 0356 08/22/17 0322 08/23/17 0350 08/24/17 0249 08/25/17 0359 08/26/17 0349  NA 133* 133* 134* 133* 136 133*  K 3.4* 4.3 4.2 4.1 3.9 3.8  CL 101 100 99 99 101 100  CO2 25 26 28 26 26 25   GLUCOSE 110* 140* 139* 158* 168* 151*  BUN 20 12 10 10 11 11   CREATININE 1.19* 1.13* 1.10* 0.94 1.02* 0.90  CALCIUM 8.1* 8.3* 8.2* 8.5* 8.8* 8.7*  MG 1.7  --   --   --   --   --    GFR: Estimated Creatinine Clearance: 44 mL/min (by C-G formula based on SCr of 0.9 mg/dL). Liver Function Tests: Recent Labs  Lab 08/22/17 0322 08/23/17 0350 08/24/17 0249 08/25/17 0359 08/26/17 0349  AST 159* 75* 38 27 22  ALT 371* 229* 153* 109* 81*  ALKPHOS 78 78 76 71 75  BILITOT 1.2 0.8 0.9 1.0 1.7*  PROT 6.2* 5.9* 6.1* 6.1* 6.3*  ALBUMIN 2.8* 2.7* 2.8* 2.8* 2.8*   No results for input(s): LIPASE, AMYLASE in the last 168 hours. No results for input(s): AMMONIA in the last 168 hours. Coagulation  Profile: Recent Labs  Lab 08/20/17 0322 08/20/17 0832 08/21/17 0356 08/22/17 0322  INR 1.51 1.44 1.30 1.30   Cardiac Enzymes: No results for input(s): CKTOTAL, CKMB, CKMBINDEX, TROPONINI in the last 168 hours. BNP (last 3 results) No results for input(s): PROBNP in the last 8760 hours. HbA1C: No results for input(s): HGBA1C in the last 72 hours. CBG: Recent Labs  Lab 08/25/17 0813 08/25/17 1224 08/25/17 1642 08/25/17 2139 08/26/17 0757  GLUCAP 148* 166* 169* 129* 163*   Lipid Profile: No results for input(s): CHOL, HDL, LDLCALC, TRIG, CHOLHDL, LDLDIRECT in the last 72 hours. Thyroid Function Tests: No results for input(s): TSH, T4TOTAL, FREET4, T3FREE, THYROIDAB in the last 72 hours. Anemia Panel: No results for input(s): VITAMINB12, FOLATE, FERRITIN, TIBC, IRON, RETICCTPCT  in the last 72 hours. Sepsis Labs: No results for input(s): PROCALCITON, LATICACIDVEN in the last 168 hours.  Recent Results (from the past 240 hour(s))  MRSA PCR Screening     Status: None   Collection Time: 08/18/17  6:34 PM  Result Value Ref Range Status   MRSA by PCR NEGATIVE NEGATIVE Final    Comment:        The GeneXpert MRSA Assay (FDA approved for NASAL specimens only), is one component of a comprehensive MRSA colonization surveillance program. It is not intended to diagnose MRSA infection nor to guide or monitor treatment for MRSA infections. Performed at [facility], [place] [address], [place], [place] [address]   Culture, blood (routine x 2)     Status: None   Collection Time: 08/18/17  9:54 PM  Result Value Ref Range Status   Specimen Description BLOOD LEFT FOREARM  Final   Special Requests   Final    BOTTLES DRAWN AEROBIC AND ANAEROBIC Blood Culture adequate volume   Culture   Final    NO GROWTH 5 DAYS Performed at [facility], [place] [address], [place], [place] [address]    Report Status 08/23/2017 FINAL  Final  Culture, blood (routine x 2)     Status: None    Collection Time: 08/18/17  9:54 PM  Result Value Ref Range Status   Specimen Description BLOOD LEFT HAND  Final   Special Requests   Final    BOTTLES DRAWN AEROBIC AND ANAEROBIC Blood Culture results may not be optimal due to an inadequate volume of blood received in culture bottles   Culture   Final    NO GROWTH 5 DAYS Performed at [facility], [place] [address], [place], [place] [address]    Report Status 08/23/2017 FINAL  Final  Urine Culture     Status: None   Collection Time: 08/22/17 10:36 AM  Result Value Ref Range Status   Specimen Description URINE, CATHETERIZED  Final   Special Requests NONE  Final   Culture   Final    NO GROWTH Performed at [facility], [place] [address], [place],  [address]    Report Status 08/23/2017 FINAL  Final         Radiology Studies: No results found.      Scheduled Meds: . bisacodyl  10 mg Oral Daily  . Chlorhexidine Gluconate Cloth  6 each Topical Q0600  . dronabinol  2.5 mg Oral TID AC  . insulin aspart  0-5 Units Subcutaneous QHS  . insulin aspart  0-9 Units Subcutaneous TID WC  . ipratropium-albuterol  3 mL Nebulization TID  . mouth rinse  15 mL Mouth Rinse BID  . metoprolol tartrate  12.5 mg Oral BID  . multivitamin  1 tablet Oral Daily  . nicotine  21 mg Transdermal Daily  . pantoprazole  40 mg Oral BID  . senna-docusate  2 tablet Oral BID  . sucralfate  1 g Oral TID WC & HS  . Tbo-Filgrastim  480 mcg Subcutaneous q1800   Continuous Infusions:   LOS: 8 days     Georgette Shell, MD [facility] If 7PM-7AM, please contact night-coverage [email or web address] Password [facility] 08/26/2017, 9:44 AM

## 2017-08-27 LAB — GLUCOSE, CAPILLARY
GLUCOSE-CAPILLARY: 167 mg/dL — AB (ref 70–99)
GLUCOSE-CAPILLARY: 167 mg/dL — AB (ref 70–99)
Glucose-Capillary: 193 mg/dL — ABNORMAL HIGH (ref 70–99)
Glucose-Capillary: 137 mg/dL — ABNORMAL HIGH (ref 70–99)

## 2017-08-27 LAB — CBC WITH DIFFERENTIAL/PLATELET
BASOS PCT: 0 %
Basophils Absolute: 0 10*3/uL (ref 0.0–0.1)
EOS ABS: 0.4 10*3/uL (ref 0.0–0.7)
EOS PCT: 4 %
HEMATOCRIT: 27.9 % — AB (ref 36.0–46.0)
Hemoglobin: 8.8 g/dL — ABNORMAL LOW (ref 12.0–15.0)
Lymphocytes Relative: 8 %
Lymphs Abs: 0.8 10*3/uL (ref 0.7–4.0)
MCH: 31.2 pg (ref 26.0–34.0)
MCHC: 31.5 g/dL (ref 30.0–36.0)
MCV: 98.9 fL (ref 78.0–100.0)
MONO ABS: 0.2 10*3/uL (ref 0.1–1.0)
Monocytes Relative: 2 %
NEUTROS ABS: 8.9 10*3/uL — AB (ref 1.7–7.7)
Neutrophils Relative %: 86 %
PLATELETS: 12 10*3/uL — AB (ref 150–400)
RBC: 2.82 MIL/uL — ABNORMAL LOW (ref 3.87–5.11)
RDW: 19.1 % — AB (ref 11.5–15.5)
WBC: 10.3 10*3/uL (ref 4.0–10.5)

## 2017-08-27 LAB — COMPREHENSIVE METABOLIC PANEL
ALT: 61 U/L — AB (ref 0–44)
AST: 18 U/L (ref 15–41)
Albumin: 2.8 g/dL — ABNORMAL LOW (ref 3.5–5.0)
Alkaline Phosphatase: 80 U/L (ref 38–126)
Anion gap: 10 (ref 5–15)
BILIRUBIN TOTAL: 1.5 mg/dL — AB (ref 0.3–1.2)
BUN: 12 mg/dL (ref 8–23)
CALCIUM: 8.8 mg/dL — AB (ref 8.9–10.3)
CHLORIDE: 100 mmol/L (ref 98–111)
CO2: 22 mmol/L (ref 22–32)
CREATININE: 1.02 mg/dL — AB (ref 0.44–1.00)
GFR, EST NON AFRICAN AMERICAN: 52 mL/min — AB (ref >60)
Glucose, Bld: 134 mg/dL — ABNORMAL HIGH (ref 70–99)
Potassium: 3.8 mmol/L (ref 3.5–5.1)
Sodium: 132 mmol/L — ABNORMAL LOW (ref 135–145)
TOTAL PROTEIN: 6.2 g/dL — AB (ref 6.5–8.1)

## 2017-08-27 LAB — TROPONIN I: Troponin I: <0.03 ng/mL (ref <0.03)

## 2017-08-27 LAB — LACTATE DEHYDROGENASE: LDH: 160 U/L (ref 98–192)

## 2017-08-27 NOTE — Progress Notes (Signed)
CCMD reported  patient just had 5 beats of VT, on re-assessment, patient was asymptomatic. Will pass it on to the incoming RN to let the rounding MD aware. Will continue to monitor.

## 2017-08-27 NOTE — Progress Notes (Addendum)
PROGRESS NOTE    [name]  DGL:[id number] DOB: [birth year] DOA: 08/18/2017 PCP: Redmond School, MD  Brief Narrative:77 y/o F smoker (2ppd) with a history of Lupus, DM, RA on methotrexate, fibromyalgia, and early lung cancer status post stereotactic radiofrequency ablation who presented to [facility] ER on 8/2 with malaise and fatigue. She was recently diagnosed with high-grade myelodysplastic syndrome and treated with Vidaza (azacitidine), with her last dose on 08/11/17. She was found to be hypotensive, severely anemic and thrombocytopenic. She was transferred to [facility] for evaluation of anemia and thrombocytopenia.   Assessment & Plan:   Active Problems:   Upper GI bleed   Thrombocytopenia (HCC)   MDS (myelodysplastic syndrome) (HCC)   Anemia   Weakness   Acute blood loss anemia   Abnormal LFTs   Hypocalcemia   Coffee ground emesis/ possible upper GI bleed. Hgb 3.9 >> 8.8. Now stable. h/o NSAID USE and is on chronic steroids. Possibly gastritis on top of the MDS.  S/p 4 units of PRBC transfusion and 1 unit of platelet transfusion.  - GI consulted, recommended EGD when platelets improve to >50,000. No clinical evidence of ongoing bleeding at this time.  -Continue  BID Po protonix  MDS with Monosomy 7 abnormality: With profound anemia and thrombocytopenia: 12 today. S/p 4units of prbc transfusion and 1 unit of platelet transfusion.  -No significant improvement in platelets, since admission.  -Hem/oncology added on Granix and platelets yesterday. -cbc a.m  5 beat run of V. Tach-patient asymptomatic.  No chest pain or shortness of breath.  Denies cardiac history, unremarkable cardiac exam.  Last echo 2015-60 to 65%, G1DD.  Likely related to anemia. -Troponin x2, EKG unremarkable.  Normal intervals QTC 457.  Normal electrolytes -Check magnesium -If recurrent or symptomatic consider echocardiogram  Elevated liver function panel:  Improving. 08/20/17 CT abd -gallbladder  unremarkable.  UTI: Completed a course of oral antibiotics.   Non small cell lung CA:  s/p XRT with repeat PET in 06/2017 that did not show hypermetabolic activity  Hypertension: Presented with hypotension. Now stable . - Cont metoprolol.   Diabetes mellitus:  Home meds- Amaryl 4 mg daily, glipizide 5 mg daily and metformin - SSi  AKI: Likely 2/2 hypotension, hypovolemia in the setting of metformin ACE inhibitors and NSAIDs at home.  As well as methotrexate at home. Cr- 1.02 today  Hyponatremia: mild.  Stable 133.  Lupus and RA:  Quiescent.   Tobacco abuse:  Nicotine patch added  Constipation continue senna or Dulcolax added.   DVT prophylaxisS CD Code Status: DO NOT RESUSCITATE Family Communication: None at bedside  Disposition Plan: Patient lives at home with her son who is not there 24 hours a day.  PT recommends SNF.  Consultants:  GI and oncology   Procedures: None  Antimicrobials: None  Subjective: No complaints today.  No headache.  No hematemesis hematochezia or melena.  Objective: Vitals:   08/26/17 1307 08/26/17 2115 08/26/17 2117 08/27/17 0611  BP: (!) 127/53 (!) 137/103 (!) 146/66 (!) 134/59  Pulse: 89 (!) 115 (!) 116 (!) 105  Resp: 17     Temp: 98.4 F (36.9 C) 98 F (36.7 C)  98.2 F (36.8 C)  TempSrc: Oral Oral  Oral  SpO2: 100%  99% 97%  Weight:      Height:        Intake/Output Summary (Last 24 hours) at 08/27/2017 1953 Last data filed at 08/27/2017 1805 Gross per 24 hour  Intake 400 ml  Output 200 ml  Net 200 ml   Filed Weights   08/24/17 0400 08/25/17 0503 08/26/17 0500  Weight: 62.2 kg 65 kg 62.2 kg    Examination:  General exam: Appears calm and comfortable  Respiratory system: Clear to auscultation. Respiratory effort normal. Cardiovascular system: S1 & S2 heard, RRR. No JVD, murmurs, rubs, gallops or clicks. No pedal edema. Gastrointestinal system: Abdomen is nondistended, soft and nontender. No organomegaly or  masses felt. Normal bowel sounds heard. Central nervous system: Alert and oriented. No focal neurological deficits. Extremities: Symmetric 5 x 5 power. Skin: No rashes, lesions or ulcers Psychiatry: Judgement and insight appear normal. Mood & affect appropriate.    Data Reviewed: I have personally reviewed following labs and imaging studies  CBC: Recent Labs  Lab 08/24/17 0249 08/24/17 0633 08/25/17 0359 08/26/17 0349 08/27/17 0210  WBC 2.7* 2.5* 2.3* 8.5 10.3  NEUTROABS  --  1.5* 1.3* 7.1 8.9*  HGB 8.7* 9.1* 8.7* 9.5* 8.8*  HCT 28.0* 28.8* 27.9* 29.7* 27.9*  MCV 101.8* 98.6 101.1* 99.3 98.9  PLT 29* 27* 19* 15* 12*   Basic Metabolic Panel: Recent Labs  Lab 08/21/17 0356  08/23/17 0350 08/24/17 0249 08/25/17 0359 08/26/17 0349 08/27/17 0210  NA 133*   < > 134* 133* 136 133* 132*  K 3.4*   < > 4.2 4.1 3.9 3.8 3.8  CL 101   < > 99 99 101 100 100  CO2 25   < > 28 26 26 25 22   GLUCOSE 110*   < > 139* 158* 168* 151* 134*  BUN 20   < > 10 10 11 11 12   CREATININE 1.19*   < > 1.10* 0.94 1.02* 0.90 1.02*  CALCIUM 8.1*   < > 8.2* 8.5* 8.8* 8.7* 8.8*  MG 1.7  --   --   --   --   --   --    < > = values in this interval not displayed.   GFR: Estimated Creatinine Clearance: 38.8 mL/min (A) (by C-G formula based on SCr of 1.02 mg/dL (H)). Liver Function Tests: Recent Labs  Lab 08/23/17 0350 08/24/17 0249 08/25/17 0359 08/26/17 0349 08/27/17 0210  AST 75* 38 27 22 18   ALT 229* 153* 109* 81* 61*  ALKPHOS 78 76 71 75 80  BILITOT 0.8 0.9 1.0 1.7* 1.5*  PROT 5.9* 6.1* 6.1* 6.3* 6.2*  ALBUMIN 2.7* 2.8* 2.8* 2.8* 2.8*   Coagulation Profile: Recent Labs  Lab 08/21/17 0356 08/22/17 0322  INR 1.30 1.30   Cardiac Enzymes: Recent Labs  Lab 08/27/17 1118  TROPONINI <0.03   CBG: Recent Labs  Lab 08/26/17 1624 08/26/17 2126 08/27/17 0729 08/27/17 1240 08/27/17 1705  GLUCAP 168* 147* 167* 193* 137*    Recent Results (from the past 240 hour(s))  MRSA PCR  Screening     Status: None   Collection Time: 08/18/17  6:34 PM  Result Value Ref Range Status   MRSA by PCR NEGATIVE NEGATIVE Final    Comment:        The GeneXpert MRSA Assay (FDA approved for NASAL specimens only), is one component of a comprehensive MRSA colonization surveillance program. It is not intended to diagnose MRSA infection nor to guide or monitor treatment for MRSA infections. Performed at [facility], [place] [address] [place], [place] [address]   Culture, blood (routine x 2)     Status: None   Collection Time: 08/18/17  9:54 PM  Result Value Ref Range Status   Specimen Description  BLOOD LEFT FOREARM  Final   Special Requests   Final    BOTTLES DRAWN AEROBIC AND ANAEROBIC Blood Culture adequate volume   Culture   Final    NO GROWTH 5 DAYS Performed at [facility], [address] [place], [place] [address]    Report Status 08/23/2017 FINAL  Final  Culture, blood (routine x 2)     Status: None   Collection Time: 08/18/17  9:54 PM  Result Value Ref Range Status   Specimen Description BLOOD LEFT HAND  Final   Special Requests   Final    BOTTLES DRAWN AEROBIC AND ANAEROBIC Blood Culture results may not be optimal due to an inadequate volume of blood received in culture bottles   Culture   Final    NO GROWTH 5 DAYS Performed at [facility], [place] [address], [place], [place] [address]    Report Status 08/23/2017 FINAL  Final  Urine Culture     Status: None   Collection Time: 08/22/17 10:36 AM  Result Value Ref Range Status   Specimen Description URINE, CATHETERIZED  Final   Special Requests NONE  Final   Culture   Final    NO GROWTH Performed at [facility], [place] [address] [place], [place] [address]    Report Status 08/23/2017 FINAL  Final     Radiology Studies: No results found.  Scheduled Meds: . bisacodyl  10 mg Oral Daily  . Chlorhexidine Gluconate Cloth  6 each Topical Q0600  . dronabinol  2.5 mg Oral TID AC  . insulin  aspart  0-5 Units Subcutaneous QHS  . insulin aspart  0-9 Units Subcutaneous TID WC  . mouth rinse  15 mL Mouth Rinse BID  . metoprolol tartrate  12.5 mg Oral BID  . multivitamin  1 tablet Oral Daily  . pantoprazole  40 mg Oral BID  . senna-docusate  2 tablet Oral BID  . sucralfate  1 g Oral TID WC & HS  . Tbo-Filgrastim  480 mcg Subcutaneous q1800   Continuous Infusions:   LOS: 9 days    Rayland Hamed Arlyce Dice, MD [facility]  If 7PM-7AM, please contact night-coverage [email or web address] Password [facility] 08/27/2017, 7:53 PM

## 2017-08-28 ENCOUNTER — Encounter (HOSPITAL_COMMUNITY): Payer: Self-pay | Admitting: Nephrology

## 2017-08-28 DIAGNOSIS — I499 Cardiac arrhythmia, unspecified: Secondary | ICD-10-CM

## 2017-08-28 DIAGNOSIS — R5381 Other malaise: Secondary | ICD-10-CM

## 2017-08-28 LAB — PREPARE RBC (CROSSMATCH)

## 2017-08-28 LAB — CBC WITH DIFFERENTIAL/PLATELET
BASOS ABS: 0.1 10*3/uL (ref 0.0–0.1)
Basophils Relative: 1 %
Eosinophils Absolute: 0.1 10*3/uL (ref 0.0–0.7)
Eosinophils Relative: 1 %
HCT: 25.3 % — ABNORMAL LOW (ref 36.0–46.0)
HEMOGLOBIN: 7.9 g/dL — AB (ref 12.0–15.0)
Lymphocytes Relative: 10 %
Lymphs Abs: 0.7 10*3/uL (ref 0.7–4.0)
MCH: 31.2 pg (ref 26.0–34.0)
MCHC: 31.2 g/dL (ref 30.0–36.0)
MCV: 100 fL (ref 78.0–100.0)
MONOS PCT: 3 %
Monocytes Absolute: 0.2 10*3/uL (ref 0.1–1.0)
Neutro Abs: 5.5 10*3/uL (ref 1.7–7.7)
Neutrophils Relative %: 85 %
Platelets: 8 10*3/uL — CL (ref 150–400)
RBC: 2.53 MIL/uL — ABNORMAL LOW (ref 3.87–5.11)
RDW: 19.3 % — ABNORMAL HIGH (ref 11.5–15.5)
WBC: 6.6 10*3/uL (ref 4.0–10.5)

## 2017-08-28 LAB — COMPREHENSIVE METABOLIC PANEL
ALBUMIN: 2.8 g/dL — AB (ref 3.5–5.0)
ALK PHOS: 91 U/L (ref 38–126)
ALT: 45 U/L — ABNORMAL HIGH (ref 0–44)
ANION GAP: 7 (ref 5–15)
AST: 18 U/L (ref 15–41)
BUN: 13 mg/dL (ref 8–23)
CALCIUM: 8.7 mg/dL — AB (ref 8.9–10.3)
CHLORIDE: 100 mmol/L (ref 98–111)
CO2: 26 mmol/L (ref 22–32)
Creatinine, Ser: 1.09 mg/dL — ABNORMAL HIGH (ref 0.44–1.00)
GFR calc Af Amer: 56 mL/min — ABNORMAL LOW (ref >60)
GFR calc non Af Amer: 48 mL/min — ABNORMAL LOW (ref >60)
GLUCOSE: 134 mg/dL — AB (ref 70–99)
Potassium: 3.8 mmol/L (ref 3.5–5.1)
SODIUM: 133 mmol/L — AB (ref 135–145)
Total Bilirubin: 1.3 mg/dL — ABNORMAL HIGH (ref 0.3–1.2)
Total Protein: 6.3 g/dL — ABNORMAL LOW (ref 6.5–8.1)

## 2017-08-28 LAB — GLUCOSE, CAPILLARY
Glucose-Capillary: 134 mg/dL — ABNORMAL HIGH (ref 70–99)
Glucose-Capillary: 144 mg/dL — ABNORMAL HIGH (ref 70–99)
Glucose-Capillary: 165 mg/dL — ABNORMAL HIGH (ref 70–99)
Glucose-Capillary: 124 mg/dL — ABNORMAL HIGH (ref 70–99)

## 2017-08-28 LAB — LACTATE DEHYDROGENASE: LDH: 165 U/L (ref 98–192)

## 2017-08-28 LAB — MAGNESIUM: Magnesium: 1.7 mg/dL (ref 1.7–2.4)

## 2017-08-28 MED ORDER — SODIUM CHLORIDE 0.9% IV SOLUTION
Freq: Once | INTRAVENOUS | Status: AC
  Administered 2017-08-28: 09:00:00 via INTRAVENOUS

## 2017-08-28 MED ORDER — FUROSEMIDE 10 MG/ML IJ SOLN
20.0000 mg | Freq: Once | INTRAMUSCULAR | Status: AC
  Administered 2017-08-28: 20 mg via INTRAVENOUS
  Filled 2017-08-28: qty 2

## 2017-08-28 MED ORDER — ACETAMINOPHEN 325 MG PO TABS
650.0000 mg | ORAL_TABLET | Freq: Once | ORAL | Status: AC
  Administered 2017-08-28: 650 mg via ORAL
  Filled 2017-08-28: qty 2

## 2017-08-28 NOTE — Progress Notes (Signed)
PROGRESS NOTE    [name]  TDD:[id number] DOB: 1940/04/14 DOA: 08/18/2017 PCP: Redmond School, MD  Brief Narrative:76 y/o F smoker (2ppd) with a history of Lupus, DM, RA on methotrexate, fibromyalgia, and early lung cancer status post stereotactic radiofrequency ablation who presented to [facility] ER on 8/2 with malaise and fatigue. She was recently diagnosed with high-grade myelodysplastic syndrome and treated with Vidaza (azacitidine), with her last dose on 08/11/17. She was found to be hypotensive, severely anemic and thrombocytopenic. She was transferred to [facility] for evaluation of anemia and thrombocytopenia.   Assessment & Plan:   Active Problems:   Upper GI bleed   Thrombocytopenia (HCC)   MDS (myelodysplastic syndrome) (HCC)   Anemia   Weakness   Acute blood loss anemia   Abnormal LFTs   Hypocalcemia    MDS with Monosomy 7 abnormality: With profound anemia and thrombocytopenia: plts 8 today. Received 1 u plts S/p 5 units of prbc transfusion and 5 units of platelet transfusion this admit  -No significant improvement in platelets, since admission.  -Hem/oncology started Granix for neutropenia on 8/9, today is D#4 -cbc a.m - unable to get placement of a Port due to severe low plts - unclear what the prognosis for this condition.  I spoke w/ oncology, okay to dc patient and f/u w/ oncology in [place].  [place] and Dr Walden Field is off today but will be back tomorrow.  PT recommended SNF but patient is refusing SNF and wants to go home.  Talked w/ grand-daughter who does not feel coming home is safe w/ her current illness unless she is comfort care.  D/ W pt who is not "ready to die".  Will hold off on dc today, consult pall care team.  I'm not sure what her prognosis is either from this MDS, it doesn't seem good, but I'm not sure. Hopefully we can get some help with this.  Coffee ground emesis/ possible upper GI bleed. Hgb 3.9 >> 8.8. Now stable. h/o NSAID USE and is on  chronic steroids. Possibly gastritis on top of the MDS.  S/p 4 units of PRBC transfusion and 1 unit of platelet transfusion.  - GI consulted, recommended EGD when platelets improve to >50,000. No clinical evidence of ongoing bleeding at this time.  -Continue  BID Po protonix     5 beat run of V. Tach-patient asymptomatic.  No chest pain or shortness of breath.  Denies cardiac history, unremarkable cardiac exam.  Last echo 2015-60 to 65%, G1DD.  Likely related to anemia. -Troponin x2, EKG unremarkable.  Normal intervals QTC 457.  Normal electrolytes -If recurrent or symptomatic consider echocardiogram  Elevated liver function panel:  Improving. 08/20/17 CT abd -gallbladder unremarkable.  UTI: Completed a course of oral antibiotics.   Non small cell lung CA:  s/p XRT in 2017 with repeat PET in 06/2017 that did not show hypermetabolic activity  Hypertension: Presented with hypotension. Now stable . - Cont metoprolol.   Diabetes mellitus:  Home meds- Amaryl 4 mg daily, glipizide 5 mg daily and metformin - SSi  AKI: Likely 2/2 hypotension, hypovolemia in the setting of metformin ACE inhibitors and NSAIDs at home.  As well as methotrexate at home. Cr- 1.02 today  Hyponatremia: mild.  Stable 133.  Lupus and RA:  Quiescent.   Tobacco abuse:  Nicotine patch added  Constipation continue senna or Dulcolax added.   Kelly Splinter MD [facility] pgr [phone] 06/11/2017, 9:25 AM     DVT prophylaxis:  SCD Code Status: DO NOT RESUSCITATE Family Communication: none at bedside  Disposition Plan: Patient lives at home with her son who is not there 24 hours a day.  PT recommends SNF.  Consultants:  GI and oncology   Procedures: None  Antimicrobials: None  Subjective:  Wants to go home but not today, tomorrow. No headache.  No hematemesis hematochezia or melena.  Objective: Vitals:   08/28/17 1104 08/28/17 1130 08/28/17 1152 08/28/17 1421  BP: (!) 140/54  (!) 145/54 (!) 139/54 (!) 125/50  Pulse: 99 99 (!) 104 89  Resp: 18 18 18 18   Temp: 98 F (36.7 C) 98.1 F (36.7 C) 98 F (36.7 C) 98.3 F (36.8 C)  TempSrc: Oral Oral Oral Oral  SpO2: 100% 100% 100% 100%  Weight:      Height:        Intake/Output Summary (Last 24 hours) at 08/28/2017 1523 Last data filed at 08/28/2017 1413 Gross per 24 hour  Intake 562 ml  Output 250 ml  Net 312 ml   Filed Weights   08/25/17 0503 08/26/17 0500 08/28/17 0503  Weight: 65 kg 62.2 kg 62.2 kg    Examination:  General exam: Appears calm and comfortable  Respiratory system: Clear to auscultation. Respiratory effort normal. Cardiovascular system: S1 & S2 heard, RRR. No JVD, murmurs, rubs, gallops or clicks. No pedal edema. Gastrointestinal system: Abdomen is nondistended, soft and nontender. No organomegaly or masses felt. Normal bowel sounds heard. Central nervous system: Alert and oriented. No focal neurological deficits. Extremities: Symmetric 5 x 5 power. Skin: No rashes, lesions or ulcers Psychiatry: Judgement and insight appear normal. Mood & affect appropriate.    Data Reviewed: I have personally reviewed following labs and imaging studies  CBC: Recent Labs  Lab 08/24/17 0633 08/25/17 0359 08/26/17 0349 08/27/17 0210 08/28/17 0416  WBC 2.5* 2.3* 8.5 10.3 6.6  NEUTROABS 1.5* 1.3* 7.1 8.9* 5.5  HGB 9.1* 8.7* 9.5* 8.8* 7.9*  HCT 28.8* 27.9* 29.7* 27.9* 25.3*  MCV 98.6 101.1* 99.3 98.9 100.0  PLT 27* 19* 15* 12* 8*   Basic Metabolic Panel: Recent Labs  Lab 08/24/17 0249 08/25/17 0359 08/26/17 0349 08/27/17 0210 08/28/17 0416  NA 133* 136 133* 132* 133*  K 4.1 3.9 3.8 3.8 3.8  CL 99 101 100 100 100  CO2 26 26 25 22 26   GLUCOSE 158* 168* 151* 134* 134*  BUN 10 11 11 12 13   CREATININE 0.94 1.02* 0.90 1.02* 1.09*  CALCIUM 8.5* 8.8* 8.7* 8.8* 8.7*  MG  --   --   --   --  1.7   GFR: Estimated Creatinine Clearance: 36.3 mL/min (A) (by C-G formula based on SCr of 1.09 mg/dL  (H)). Liver Function Tests: Recent Labs  Lab 08/24/17 0249 08/25/17 0359 08/26/17 0349 08/27/17 0210 08/28/17 0416  AST 38 27 22 18 18   ALT 153* 109* 81* 61* 45*  ALKPHOS 76 71 75 80 91  BILITOT 0.9 1.0 1.7* 1.5* 1.3*  PROT 6.1* 6.1* 6.3* 6.2* 6.3*  ALBUMIN 2.8* 2.8* 2.8* 2.8* 2.8*   Coagulation Profile: Recent Labs  Lab 08/22/17 0322  INR 1.30   Cardiac Enzymes: Recent Labs  Lab 08/27/17 1118 08/27/17 2123  TROPONINI <0.03 <0.03   CBG: Recent Labs  Lab 08/27/17 1240 08/27/17 1705 08/27/17 2131 08/28/17 0725 08/28/17 1210  GLUCAP 193* 137* 167* 134* 144*    Recent Results (from the past 240 hour(s))  MRSA PCR Screening     Status: None   Collection  Time: 08/18/17  6:34 PM  Result Value Ref Range Status   MRSA by PCR NEGATIVE NEGATIVE Final    Comment:        The GeneXpert MRSA Assay (FDA approved for NASAL specimens only), is one component of a comprehensive MRSA colonization surveillance program. It is not intended to diagnose MRSA infection nor to guide or monitor treatment for MRSA infections. Performed at [facility], [place] [address], [place], [place] [address]   Culture, blood (routine x 2)     Status: None   Collection Time: 08/18/17  9:54 PM  Result Value Ref Range Status   Specimen Description BLOOD LEFT FOREARM  Final   Special Requests   Final    BOTTLES DRAWN AEROBIC AND ANAEROBIC Blood Culture adequate volume   Culture   Final    NO GROWTH 5 DAYS Performed at [facility], [place] [address] [place], [place] [address]    Report Status 08/23/2017 FINAL  Final  Culture, blood (routine x 2)     Status: None   Collection Time: 08/18/17  9:54 PM  Result Value Ref Range Status   Specimen Description BLOOD LEFT HAND  Final   Special Requests   Final    BOTTLES DRAWN AEROBIC AND ANAEROBIC Blood Culture results may not be optimal due to an inadequate volume of blood received in culture bottles   Culture   Final    NO GROWTH 5  DAYS Performed at [facility], [place] [address], [place], [place] [address]    Report Status 08/23/2017 FINAL  Final  Urine Culture     Status: None   Collection Time: 08/22/17 10:36 AM  Result Value Ref Range Status   Specimen Description URINE, CATHETERIZED  Final   Special Requests NONE  Final   Culture   Final    NO GROWTH Performed at [facility], [place] [address] [place], [place] [address]    Report Status 08/23/2017 FINAL  Final     Radiology Studies: No results found.  Scheduled Meds: . bisacodyl  10 mg Oral Daily  . Chlorhexidine Gluconate Cloth  6 each Topical Q0600  . dronabinol  2.5 mg Oral TID AC  . insulin aspart  0-5 Units Subcutaneous QHS  . insulin aspart  0-9 Units Subcutaneous TID WC  . mouth rinse  15 mL Mouth Rinse BID  . metoprolol tartrate  12.5 mg Oral BID  . multivitamin  1 tablet Oral Daily  . pantoprazole  40 mg Oral BID  . senna-docusate  2 tablet Oral BID  . sucralfate  1 g Oral TID WC & HS  . Tbo-Filgrastim  480 mcg Subcutaneous q1800   Continuous Infusions:   LOS: 10 days

## 2017-08-28 NOTE — Progress Notes (Signed)
Physical Therapy Treatment Patient Details Name: [name] MRN: [id number] DOB: 27-Apr-1940 Today's Date: 08/28/2017    History of Present Illness Pt is a 77 y.o. female admitted 77 y.o. 08/18/17 with malaise, fatigue, emesis; found to have anemia and AKI, worked up for upper GIB. Awaiting portacath placement. PMH includes recent dx of high-grade myelodysplastic syndrome (last chemo on 08/11/17), DM, fibromyalgia, luncg CA, lupus, peripheral neuropathy, RA.    PT Comments    Patient is making progress toward PT goals. Pt reports she hurts "all over" but agreeable to participate. Pt requires min guard/min A for bed mobility and gait with RW. Session limited due to pt preparing to get PRBC. Continue to progress as tolerated.    Follow Up Recommendations  SNF;Supervision/Assistance - 24 hour     Equipment Recommendations  (TBD)    Recommendations for Other Services       Precautions / Restrictions Precautions Precautions: Fall Restrictions Weight Bearing Restrictions: No    Mobility  Bed Mobility Overal bed mobility: Needs Assistance Bed Mobility: Supine to Sit     Supine to sit: Supervision     General bed mobility comments: supervision for safety  Transfers Overall transfer level: Needs assistance Equipment used: Rolling walker (2 wheeled) Transfers: Sit to/from Stand Sit to Stand: Min guard         General transfer comment: min guard for safety and cues for safe hand placement  Ambulation/Gait Ambulation/Gait assistance: Min assist Gait Distance (Feet): 80 Feet Assistive device: Rolling walker (2 wheeled) Gait Pattern/deviations: Trunk flexed;Step-through pattern;Decreased stride length Gait velocity: Decreased   General Gait Details: cues for posture and proximity to [employer] Mobility    Modified Rankin (Stroke Patients Only)       Balance Overall balance assessment: Needs assistance Sitting-balance support:  Feet supported Sitting balance-Leahy Scale: Good     Standing balance support: During functional activity Standing balance-Leahy Scale: Poor                              Cognition Arousal/Alertness: Awake/alert Behavior During Therapy: Flat affect Overall Cognitive Status: No family/caregiver present to determine baseline cognitive functioning                                 General Comments: Baseline hearing loss (does not have hearing aides). Likely baseline cognitive impairments      Exercises      General Comments General comments (skin integrity, edema, etc.): session limited due to RN request for pt to return to room for medication prior to receiving PRBC      Pertinent Vitals/Pain Pain Assessment: Faces Faces Pain Scale: Hurts a little bit Pain Location: "all over" Pain Descriptors / Indicators: Aching;Sore Pain Intervention(s): Limited activity within patient's tolerance;Monitored during session    Home Living                      Prior Function            PT Goals (current goals can now be found in the care plan section) Acute Rehab PT Goals PT Goal Formulation: With patient Time For Goal Achievement: 09/05/17 Potential to Achieve Goals: Good Progress towards PT goals: Progressing toward goals    Frequency    Min 2X/week  PT Plan Current plan remains appropriate    Co-evaluation              AM-PAC PT "6 Clicks" Daily Activity  Outcome Measure  Difficulty turning over in bed (including adjusting bedclothes, sheets and blankets)?: None Difficulty moving from lying on back to sitting on the side of the bed? : A Little Difficulty sitting down on and standing up from a chair with arms (e.g., wheelchair, bedside commode, etc,.)?: A Little Help needed moving to and from a bed to chair (including a wheelchair)?: A Little Help needed walking in hospital room?: A Little Help needed climbing 3-5 steps with a  railing? : A Lot 6 Click Score: 18    End of Session Equipment Utilized During Treatment: Gait belt Activity Tolerance: Patient tolerated treatment well Patient left: with call bell/phone within reach;in bed;with nursing/sitter in room Nurse Communication: Mobility status PT Visit Diagnosis: Other abnormalities of gait and mobility (R26.89);Muscle weakness (generalized) (M62.81)     Time: 5797-2820 PT Time Calculation (min) (ACUTE ONLY): 10 min  Charges:  $Gait Training: 8-22 mins                     Earney Navy, PTA Pager: [phone]     Darliss Cheney 08/28/2017, 3:09 PM

## 2017-08-28 NOTE — Clinical Social Work Note (Addendum)
Received call from charge RN. She stated the only facility patient will agree to go to is [facility]. They have not responded to referral yet. Patient has only one bed offer: [facility]. CSW left voicemail for [facility] admissions coordinator.  Dayton Scrape, [place]  4:28 pm [facility] cannot take patient if she is getting chemo. They can consider her if she gets PO or subcu chemo medications. Per admissions coordinator, chemo is typically on hold while patients are in SNF's due to high cost of treatment. She wants to review palliative consult note before deciding whether they can take patient or not.  Dayton Scrape, [place]

## 2017-08-28 NOTE — Progress Notes (Signed)
[name] is still hospitalized.  I guess she is hospitalized because of having some cardiac arrhythmia.  She had no obvious bleeding.  She might be eating a little bit better.  She has a hemoglobin of 7.9.  Platelet count is quite low at 8000.  We will have to give her both 1 unit of packed red blood cells and 1 unit of platelets.  She really would like to go home.  Her granddaughter says someone about her going to rehab.  She is not sure who made that decision.  Her electrolytes all look okay.  Her albumin is 2.8.  I think it is clear that her myelodysplasia is really going to be a problem.  The fact that her platelet count keeps drifting down is quite troublesome.  I did give her a dose of Nplate on Friday.  I am unsure if this is really going to help.  She is not a candidate for ESA because of the incredibly high erythropoietin level.  I think the only option to try to help with the myelodysplasia is Vidaza.  This is subcu administration.  Her granddaughter is also worried about this PICC line.  She can go home with the PICC line but will need to have it flushed every other day.  I suppose that home care come out to her house to do this.  If she is in rehab, it can be done at rehab.  Again, it would be nice if she could go home soon.  She really would like to get out of the hospital.  [name], MD  1 Mikeal [place]thorne 12:16

## 2017-08-29 ENCOUNTER — Other Ambulatory Visit (HOSPITAL_COMMUNITY): Payer: Self-pay | Admitting: Internal Medicine

## 2017-08-29 DIAGNOSIS — Z7189 Other specified counseling: Secondary | ICD-10-CM

## 2017-08-29 DIAGNOSIS — D46Z Other myelodysplastic syndromes: Secondary | ICD-10-CM

## 2017-08-29 DIAGNOSIS — Z515 Encounter for palliative care: Secondary | ICD-10-CM

## 2017-08-29 LAB — CBC WITH DIFFERENTIAL/PLATELET
Basophils Absolute: 0.1 10*3/uL (ref 0.0–0.1)
Basophils Relative: 1 %
EOS ABS: 0.1 10*3/uL (ref 0.0–0.7)
EOS PCT: 1 %
HEMATOCRIT: 29.1 % — AB (ref 36.0–46.0)
Hemoglobin: 9.2 g/dL — ABNORMAL LOW (ref 12.0–15.0)
LYMPHS ABS: 0.7 10*3/uL (ref 0.7–4.0)
Lymphocytes Relative: 14 %
MCH: 31.2 pg (ref 26.0–34.0)
MCHC: 31.6 g/dL (ref 30.0–36.0)
MCV: 98.6 fL (ref 78.0–100.0)
MONO ABS: 0.3 10*3/uL (ref 0.1–1.0)
Monocytes Relative: 5 %
NEUTROS ABS: 3.9 10*3/uL (ref 1.7–7.7)
Neutrophils Relative %: 79 %
PLATELETS: 15 10*3/uL — AB (ref 150–400)
RBC: 2.95 MIL/uL — ABNORMAL LOW (ref 3.87–5.11)
RDW: 18.9 % — AB (ref 11.5–15.5)
WBC: 5.1 10*3/uL (ref 4.0–10.5)

## 2017-08-29 LAB — PREPARE PLATELET PHERESIS: Unit division: 0

## 2017-08-29 LAB — BPAM RBC
Blood Product Expiration Date: 201908202359
ISSUE DATE / TIME: 201908121128
Unit Type and Rh: 5100

## 2017-08-29 LAB — BPAM PLATELET PHERESIS
BLOOD PRODUCT EXPIRATION DATE: 201908132359
ISSUE DATE / TIME: 201908120942
UNIT TYPE AND RH: 5100

## 2017-08-29 LAB — TYPE AND SCREEN
ABO/RH(D): O POS
Antibody Screen: NEGATIVE
Unit division: 0

## 2017-08-29 LAB — GLUCOSE, CAPILLARY
Glucose-Capillary: 171 mg/dL — ABNORMAL HIGH (ref 70–99)
Glucose-Capillary: 160 mg/dL — ABNORMAL HIGH (ref 70–99)
Glucose-Capillary: 143 mg/dL — ABNORMAL HIGH (ref 70–99)

## 2017-08-29 LAB — LACTATE DEHYDROGENASE: LDH: 189 U/L (ref 98–192)

## 2017-08-29 MED ORDER — ALPRAZOLAM 0.25 MG PO TABS
0.2500 mg | ORAL_TABLET | Freq: Two times a day (BID) | ORAL | Status: DC | PRN
Start: 2017-08-29 — End: 2017-08-29

## 2017-08-29 MED ORDER — ACETAMINOPHEN 500 MG PO TABS: 500.0000 mg | ORAL_TABLET | Freq: Four times a day (QID) | ORAL | 0 refills | Status: AC | PRN

## 2017-08-29 MED ORDER — ONDANSETRON HCL 4 MG PO TABS: 4.0000 mg | ORAL_TABLET | Freq: Four times a day (QID) | ORAL | Status: DC | PRN

## 2017-08-29 MED ORDER — SODIUM CHLORIDE 0.9% IV SOLUTION
Freq: Once | INTRAVENOUS | Status: DC
  Administered 2017-08-29: 13:00:00 via INTRAVENOUS

## 2017-08-29 MED ORDER — ONDANSETRON HCL 4 MG PO TABS: 4.0000 mg | ORAL_TABLET | Freq: Four times a day (QID) | ORAL | 0 refills | Status: AC | PRN

## 2017-08-29 MED ORDER — TRAMADOL HCL 50 MG PO TABS: 50.0000 mg | ORAL_TABLET | Freq: Four times a day (QID) | ORAL | Status: DC | PRN

## 2017-08-29 MED ORDER — MORPHINE SULFATE (CONCENTRATE) 10 MG/0.5ML PO SOLN
5.0000 mg | ORAL | Status: DC | PRN
Start: 2017-08-29 — End: 2017-08-29

## 2017-08-29 MED ORDER — MORPHINE SULFATE (CONCENTRATE) 10 MG/0.5ML PO SOLN: 5.0000 mg | ORAL | 0 refills | Status: AC | PRN

## 2017-08-29 MED ORDER — SENNA 8.6 MG PO TABS: 1.0000 | ORAL_TABLET | Freq: Every day | ORAL | 1 refills | Status: AC

## 2017-08-29 MED ORDER — SENNA 8.6 MG PO TABS: 1.0000 | ORAL_TABLET | Freq: Every day | ORAL | Status: DC

## 2017-08-29 MED ORDER — ALPRAZOLAM 0.25 MG PO TABS: 0.2500 mg | ORAL_TABLET | Freq: Two times a day (BID) | ORAL | 0 refills | Status: AC | PRN

## 2017-08-29 NOTE — Progress Notes (Signed)
NCM received consult: Hospice Services in the home. [facility].  Patient will need a bed. Please note the following family requests. (1) talk to [facility] initially. (2) patient lives with Magdalene Molly will need things explained very clearly to him (medication use, how to call hospice, etc) (3) Leave only a limited supply of meds in the house - 1 week. (4) family would like to bath aid to come daily please. (5) patient needs breathing treatments at night (nebulizer).  NCM  Spoke with pt, sonRichardson Landry) and granddaughter(Kathy) regarding d/c plan: home with hospice care.Pt confirmed d/c plan and selected [facility] as the hospice provider. NCM made referral with [facility] for home hospice care. Demographics, H&P, palliative assessment fax to [facility] @[phone]. NCM to fax d/c summary once completed. Whitman Hero RN,BSN,CM

## 2017-08-29 NOTE — Consult Note (Signed)
            [facility] CM Primary Care Navigator  08/29/2017  [name] 1940-09-15 [id number]   Went to see patient to identify possible discharge needs butnoted onchart- per Inpatient CM, thatpatient will discharge home with Hospice services.   Per chart review, patient was admitted for evaluation of anemia and thrombocytopenia. (recently diagnosed with high-grade myelodysplastic syndrome)   Palliative care consulted,decision DNR/ DNI was confirmed, full comfort care, Oncology recommending no further chemotherapy or transfusions as patient's body will not tolerate them.  Discharge disposition is home with hospice (requested [facility])  [facility]ifiable [facility] Care Management needsat this point.   For additional questions please contact:  Edwena Felty A. Isley Weisheit, BSN, RN-BC [facility] PRIMARY CARE Navigator Cell: [phone]

## 2017-08-29 NOTE — Discharge Summary (Addendum)
Physician Discharge Summary  Patient ID: [name] MRN: [id number] DOB/AGE: 77-Jan-1942 77 y.o.  Admit date: 08/18/2017 Discharge date: 08/29/2017  Admission Diagnoses: Active Problems:   Upper GI bleed   Severe thrombocytopenia (HCC)   MDS (myelodysplastic syndrome) (HCC)   Anemia   Weakness   Acute blood loss anemia   Abnormal LFTs   Hypocalcemia   Discharge Diagnoses:  Active Problems:   Upper GI bleed   Severe thrombocytopenia (HCC)   MDS (myelodysplastic syndrome) (HCC)   Anemia   Weakness   Acute blood loss anemia   Abnormal LFTs   Hypocalcemia  Discharged Condition: poor  Hospital Course: 77 y/o F smoker (2ppd) with a history of Lupus, DM, RA on methotrexate, fibromyalgia, and early lung cancer status post stereotactic radiofrequency ablation who presented to [facility] ER on 8/2 with malaise and fatigue. She was recently diagnosed with high-grade myelodysplastic syndrome and treated with Vidaza (azacitidine), with her last dose on 08/11/17.She was found to be hypotensive, severely anemic and thrombocytopenic. She was transferred to [facility] for evaluation of anemia and thrombocytopenia.    Problems:  MDS with Monosomy 7 abnormality: With profound anemia and thrombocytopenia - S/p 5 units of prbc transfusion and 6 units of platelet transfusion including today - No significant improvement in platelets, since admission, platelets are in the 8k - 15k range at discharge - Hem/oncology started Granix for neutropenia on 8/9, sp 5 day course - spoke w/ Dr Walden Field oncology in [place], prognosis is very poor, she is happy to see the patient tomorrow in her office if pt so wishes - Palliative care met w/ pt and family today, prognosis is poor and patient is now going home on hospice, no further tranfusion, no more chemoRx. Will have PICC removed prior to dc.  Pt already was DNR.   Coffee ground emesis/ possible upper GI bleed. Hgb 3.9 >> 8.8. Now stable. h/o NSAID USE and is on  chronic steroids.  Anemia due to GIB and MDS - resolved for now - sp 5 units prbc's - PPI Rx  Anemia - due to ABL/ GIB and MDS. - Hb 9.2 today, sp 5u total prbcs   Elevated liver function panel:  Improving. 08/20/17 CT abd -gallbladder unremarkable.   UTI:  Completed a course of oral antibiotics.   Non small cell lung CA:  s/p XRT in 2017 with repeat PET in 06/2017 that did not show hypermetabolic activity  Hypertension: Presented with hypotension. Now stable . - Cont metoprolol, BP's good   Diabetes mellitus:  Home meds- Amaryl 4 mg daily, glipizide 5 mg daily and metformin - will resume most of her home meds upon dc  AKI: Likely 2/2 hypotension, hypovolemia in the setting of metformin ACE inhibitors and NSAIDs at home.  As well as methotrexate at home.  Resolved. Creat 1.09.     Constipation continue senna or Dulcolax added.   Discharge Exam: Blood pressure 129/60, pulse 84, temperature 98.2 F (36.8 C), temperature source Oral, resp. rate 20, height '5\' 3"'$  (1.6 m), weight 62.2 kg, SpO2 99 %. General exam: Appears calm and comfortable  Respiratory system: Clear to auscultation. Respiratory effort normal. Cardiovascular system: S1 & S2 heard, RRR. No JVD, murmurs, rubs, gallops or clicks. No pedal edema. Gastrointestinal system: Abdomen is nondistended, soft and nontender. No organomegaly or masses felt. Normal bowel sounds heard. Central nervous system: Alert and oriented. No focal neurological deficits. Extremities: Symmetric 5 x 5 power. Skin: No rashes, lesions or ulcers Psychiatry: Judgement and insight  appear normal. Mood & affect appropriate.     Allergies as of 08/29/2017      Reactions   Tape    Bupropion Nausea Only   Lopid  [gemfibrozil] Nausea Only      Medication List    STOP taking these medications   aspirin EC 81 MG tablet   glimepiride 4 MG tablet Commonly known as:  AMARYL   methotrexate 2.5 MG tablet   naproxen sodium 220 MG  tablet Commonly known as:  ALEVE     TAKE these medications   acetaminophen 500 MG tablet Commonly known as:  TYLENOL Take 1 tablet (500 mg total) by mouth every 6 (six) hours as needed for mild pain, fever or headache.   albuterol 108 (90 Base) MCG/ACT inhaler Commonly known as:  PROVENTIL HFA;VENTOLIN HFA Inhale 2 puffs into the lungs every 6 (six) hours as needed for wheezing or shortness of breath. Please instruct in usage.   ALPRAZolam 0.25 MG tablet Commonly known as:  XANAX Take 1 tablet (0.25 mg total) by mouth 2 (two) times daily as needed for up to 15 days for anxiety or sleep.   folic acid 1 MG tablet Commonly known as:  FOLVITE Take 1 mg by mouth daily.   glipiZIDE-metformin 5-500 MG tablet Commonly known as:  METAGLIP Take 1 tablet by mouth 2 (two) times daily before a meal.   lisinopril 2.5 MG tablet Commonly known as:  PRINIVIL,ZESTRIL Take 2.5 mg by mouth daily.   metoprolol tartrate 25 MG tablet Commonly known as:  LOPRESSOR Take 25 mg by mouth 2 (two) times daily. Reported on 07/28/2015   morphine CONCENTRATE 10 MG/0.5ML Soln concentrated solution Take 0.25 mLs (5 mg total) by mouth every 2 (two) hours as needed for up to 14 days for severe pain or shortness of breath.   ondansetron 8 MG tablet Commonly known as:  ZOFRAN Take 1 tablet (8 mg total) by mouth every 8 (eight) hours as needed for nausea or vomiting. What changed:  Another medication with the same name was added. Make sure you understand how and when to take each.   ondansetron 4 MG tablet Commonly known as:  ZOFRAN Take 1 tablet (4 mg total) by mouth every 6 (six) hours as needed for up to 7 days for nausea or vomiting. What changed:  You were already taking a medication with the same name, and this prescription was added. Make sure you understand how and when to take each.   PRESERVISION AREDS 2 Caps Take 2 capsules by mouth 2 (two) times daily.   senna 8.6 MG Tabs tablet Commonly known  as:  SENOKOT Take 1 tablet (8.6 mg total) by mouth at bedtime for 14 days.   traMADol 50 MG tablet Commonly known as:  ULTRAM Take 50 mg by mouth every 6 (six) hours as needed.        Signed: Sandy Salaam Priyah Schmuck 08/29/2017, 3:58 PM

## 2017-08-29 NOTE — Progress Notes (Signed)
[name] to be D/C'd Home per MD order.  Discussed with the patient and all questions fully answered.  VSS, Skin clean, dry and intact without evidence of skin break down, no evidence of skin tears noted. IV catheter discontinued intact. Site without signs and symptoms of complications. Dressing and pressure applied.  An After Visit Summary was printed and given to the patient. Patient received prescription.  D/c education completed with patient/family including follow up instructions, medication list, d/c activities limitations if indicated, with other d/c instructions as indicated by MD - patient able to verbalize understanding, all questions fully answered.   Patient instructed to return to ED, call 911, or call MD for any changes in condition.   Patient escorted via [place], and D/C home via private auto.  Holley Raring 08/29/2017 6:38 PM

## 2017-08-29 NOTE — Consult Note (Signed)
Consultation Note Date: 08/29/2017   Patient Name: [name]  DOB: 11/19/1940  MRN: [id number]  Age / Sex: 77 y.o., female  PCP: Redmond School, MD Referring Physician: Roney Jaffe, MD  Reason for Consultation: Disposition, Establishing goals of care and Psychosocial/spiritual support  HPI/Patient Profile: 77 y.o. female  with past medical history of [place] lung CA, lupus, DM, RA who was admitted on 08/18/2017 with fatigue and coffee ground emesis.   She was found to have a hgb of 3.9 and a platelet count of 18.  Her LFTs were significantly elevated.  She had been receiving Vidaza for a recent diagnosis of MDS.  Her last treatment was 7/26.  Clinical Assessment and Goals of Care:  I have reviewed medical records including EPIC notes, labs and imaging, received report from Dr. Jonnie Finner, assessed the patient and then met at the bedside along with her sons Juanda Crumble (whom she lives with) and Richardson Landry.  Her grand daughter Juliann Pulse was on conference call.  We discussed diagnosis prognosis, GOC, EOL wishes, disposition and options.  I introduced Palliative Medicine as specialized medical care for people living with serious illness. It focuses on providing relief from the symptoms and stress of a serious illness. The goal is to improve quality of life for both the patient and the family.  We discussed a brief life review of the patient.  Mrs. 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  She also worked for [employer].  She raised two boys.  She lives with her son Eduard Clos and her son Richardson Landry lives 10 minutes away.  Mrs. Polyakov also has a grand daughter Juliann Pulse) who is very involved in her care.  Mrs. Kennebrew used to love being active - swimming - and going to church, but she has struggled with rheumatoid arthritis for 40 years and now has significant joint pain that keeps her from being  active.  As far as functional and nutritional status at home she spends most of her time in a chair.  She can no longer see or hear well.  She has a very decreased appetite and has lost a significant amount of weight.   We discussed her current illness and what it means in the larger context of her on-going co-morbidities.  Natural disease trajectory and expectations at EOL were discussed.  Specifically she continues to suffer with RA.  The newly diagnosed MDS has decreased her body's ability to produce blood cells.  The chemotherapy seemed to significantly wipe out blood cells.  It is our opinion that she will live longer without chemotherapy at this point.  The patient agrees and does not want further chemotherapy.    I attempted to elicit values and goals of care important to the patient.  She wants to be at home (no nursing facility) and feel as good as possible for as long as possible.  Hospice and Palliative Care services outpatient were explained and offered.  The patient and her family are agreeable to accepting hospice services in  the home.  Questions and concerns were addressed.  The family was encouraged to call with questions or concerns.    Primary Decision Maker:  PATIENT, her two sons and grand daughter aid in her decision making     SUMMARY OF RECOMMENDATIONS    D/C home with Hospice services (family [facility].  Per patient "they do a bang up job).  Family is hopeful for a hospital bed; bath aid daily.  Symptom management:    tramadol for moderate pain;   roxanol for severe pain and shortness of breath;   Albuterol nebulizers prn shortness of breath  senna bid;   zofran for nausea  Xanax 0.25 mg bid prn anxiety or insomnia  Family requests that no more than 1 weeks supply of medication remain in the house.  Code Status/Advance Care Planning:  DNR/DNI  Additional Recommendations (Limitations, Scope, Preferences):  Avoid Hospitalization,  Full Comfort Care and No Chemotherapy  Psycho-social/Spiritual:   Desire for further Chaplaincy support: yes  Prognosis:   Less than 6 months secondary to rapid decline with high grade MDS.  Hospitalized with severe anemia (Hgb 3.9) and thrombocytopenia (platelets 8).  Oncology recommending no further chemotherapy or transfusions as her body will not tolerate them.  Discharge Planning: Home with Hospice      Primary Diagnoses: Present on Admission: . Anemia   I have reviewed the medical record, interviewed the patient and family, and examined the patient. The following aspects are pertinent.  Past Medical History:  Diagnosis Date  . Diabetes mellitus    x 20 yrs  . Fibromyalgia   . Fibromyalgia   . Lung cancer ([place]) 01/28/2015   left upper lobe lung /right upper lobe lung  . Lupus ([place])    sle  . Peripheral neuropathy   . RA (rheumatoid arthritis) ([place])    ra  . Thrombocytopenia, unspecified ([place]) 09/04/2013  . Tobacco use    Social History   Socioeconomic History  . Marital status: Widowed    Spouse name: Not on file  . Number of children: Not on file  . Years of education: Not on file  . Highest education level: Not on file  Occupational History  . Not on file  Social Needs  . Financial resource strain: Not on file  . Food insecurity:    Worry: Not on file    Inability: Not on file  . Transportation needs:    Medical: Not on file    Non-medical: Not on file  Tobacco Use  . Smoking status: Current Every Day Smoker    Packs/day: 1.50    Years: 40.00    Pack years: 60.00    Types: Cigarettes  . Smokeless tobacco: Never Used  . Tobacco comment: 2 packs a day x 30 yrs  Substance and Sexual Activity  . Alcohol use: No    Alcohol/week: 0.0 standard drinks  . Drug use: No  . Sexual activity: Not Currently  Lifestyle  . Physical activity:    Days per week: Not on file    Minutes per session: Not on file  . Stress: Not on file  Relationships  . Social  connections:    Talks on phone: Not on file    Gets together: Not on file    Attends religious service: Not on file    Active member of club or organization: Not on file    Attends meetings of clubs or organizations: Not on file    Relationship status: Not on  file  Other Topics Concern  . Not on file  Social History Narrative  . Not on file   Family History  Family history unknown: Yes   Scheduled Meds: . bisacodyl  10 mg Oral Daily  . Chlorhexidine Gluconate Cloth  6 each Topical Q0600  . dronabinol  2.5 mg Oral TID AC  . insulin aspart  0-5 Units Subcutaneous QHS  . insulin aspart  0-9 Units Subcutaneous TID WC  . mouth rinse  15 mL Mouth Rinse BID  . metoprolol tartrate  12.5 mg Oral BID  . multivitamin  1 tablet Oral Daily  . pantoprazole  40 mg Oral BID  . senna-docusate  2 tablet Oral BID  . sucralfate  1 g Oral TID WC & HS  . Tbo-Filgrastim  480 mcg Subcutaneous q1800   Continuous Infusions: PRN Meds:.acetaminophen, albuterol, ALPRAZolam, hydroxypropyl methylcellulose / hypromellose, lidocaine (PF), ondansetron (ZOFRAN) IV, sodium chloride flush Allergies  Allergen Reactions  . Tape   . Bupropion Nausea Only  . Lopid  [Gemfibrozil] Nausea Only   Review of Systems 10 sys  Physical Exam  Well developed fatigued weak appearing female, awake, alert with clear speech, HOH CV tachy Resp no distress Abdomen soft, nd, nt Ext with multiple bruises of varying sizes. 1+ edema  Vital Signs: BP (!) 154/63 (BP Location: Left Arm)   Pulse (!) 104   Temp 98.4 F (36.9 C) (Oral)   Resp 16   Ht '5\' 3"'$  (1.6 m)   Wt 62.2 kg   SpO2 98%   BMI 24.30 kg/m  Pain Scale: 0-10   Pain Score: 0-No pain   SpO2: SpO2: 98 % O2 Device:SpO2: 98 % O2 Flow Rate: .O2 Flow Rate (L/min): 2 L/min  IO: Intake/output summary:   Intake/Output Summary (Last 24 hours) at 08/29/2017 1017 Last data filed at 08/28/2017 1500 Gross per 24 hour  Intake 1012 ml  Output 450 ml  Net 562 ml     LBM: Last BM Date: 08/28/17 Baseline Weight: Weight: 61.7 kg Most recent weight: Weight: 62.2 kg     Palliative Assessment/Data: 40%     Time In: 12:30 Time Out: 2:15 Time Total: 105 min. Greater than 50%  of this time was spent counseling and coordinating care related to the above assessment and plan.  Signed by: Florentina Jenny, PA-C Palliative Medicine Pager: [phone]  Please contact Palliative Medicine Team phone at [phone] for questions and concerns.  For individual provider: See Amion             .l

## 2017-08-29 NOTE — Progress Notes (Signed)
[name] was discharged from [place] via wheelchair with her son.  Discharge paperwork and prescriptions reviewed.  Patient has no further questions.  All personal belongings accounted for and sent with the patient.

## 2017-08-29 NOTE — Care Management Note (Addendum)
Case Management Note  Patient Details  Name: PATRICI MINNIS MRN: [id number] Date of Birth: 1940/03/12  Subjective/Objective:       Presents with Anemia.Hx of Ra, Lupus, Dm, lung CA s/p SBRT recently diagnosed with high grade MDS. From home with family  Juliann Pulse Plascencia ([address]) Miguel Dibble ([address] Wendall Mola (son)      [phone]                            PCP: Redmond School  Action/Plan: Transition to home with home hospice care. Hospice nurse to do initial assessment, 8/14 - am.  Son Richardson Landry to provide transportation to home.  Expected Discharge Date:  08/29/17               Expected Discharge Plan:  Home w Hospice Care  In-House Referral:     Discharge planning Services  CM Consult  Post Acute Care Choice:    Choice offered to:  Patient  DME Arranged:   N/A DME Agency:   N/A  HH Arranged:   Home Hospice [facility] Agency:  [facility]  Status of Service:  Completed, signed off  If discussed at [place] of Stay Meetings, dates discussed:    Additional Comments:  Sharin Mons, RN 08/29/2017, 2:55 PM

## 2017-08-29 NOTE — Progress Notes (Signed)
Occupational Therapy Treatment Patient Details Name: [name] MRN: [id number] DOB: [birth year] Today's Date: 08/29/2017    History of present illness Pt is a 77 y.o. female admitted 77 y.o. 08/18/17 with malaise, fatigue, emesis; found to have anemia and AKI, worked up for upper GIB. Awaiting portacath placement. PMH includes recent dx of high-grade myelodysplastic syndrome (last chemo on 08/11/17), DM, fibromyalgia, luncg CA, lupus, peripheral neuropathy, RA.   OT comments  Pt progressing towards acute OT goals. Focus of session was grooming tasks at sink. Pt stood at times to complete grooming tasks with seated rest breaks incorporated. Fatigue limiting session. D/c plan remains appropriate.    Follow Up Recommendations  SNF;Supervision/Assistance - 24 hour    Equipment Recommendations  None recommended by OT    Recommendations for Other Services      Precautions / Restrictions Precautions Precautions: Fall Restrictions Weight Bearing Restrictions: No       Mobility Bed Mobility Overal bed mobility: Needs Assistance Bed Mobility: Supine to Sit     Supine to sit: Supervision     General bed mobility comments: supervision for safety  Transfers Overall transfer level: Needs assistance Equipment used: Rolling walker (2 wheeled) Transfers: Sit to/from Stand Sit to Stand: Min guard         General transfer comment: min guard for safety    Balance Overall balance assessment: Needs assistance Sitting-balance support: Feet supported Sitting balance-Leahy Scale: Good     Standing balance support: During functional activity Standing balance-Leahy Scale: Poor Standing balance comment: Reliant on UE support. used furniture, sink to stabilize this session.                           ADL either performed or assessed with clinical judgement   ADL Overall ADL's : Needs assistance/impaired     Grooming: Set up;Min guard;Sitting;Standing;Brushing  hair;Wash/dry hands;Oral care Grooming Details (indicate cue type and reason): seated rest breaks incorporated                             Functional mobility during ADLs: Min guard General ADL Comments: Easily fatigues. Seeks external support of furniture walking in the room.      Vision       Perception     Praxis      Cognition Arousal/Alertness: Awake/alert Behavior During Therapy: Flat affect Overall Cognitive Status: No family/caregiver present to determine baseline cognitive functioning                       Memory: Decreased short-term memory Following Commands: Follows multi-step commands with increased time Safety/Judgement: Decreased awareness of deficits   Problem Solving: Slow processing General Comments: Baseline hearing loss (does not have hearing aides). Likely baseline cognitive impairments        Exercises     Shoulder Instructions       General Comments      Pertinent Vitals/ Pain       Pain Assessment: Faces Faces Pain Scale: Hurts a little bit Pain Location: "all over" Pain Descriptors / Indicators: Aching;Sore Pain Intervention(s): Limited activity within patient's tolerance;Monitored during session;Repositioned  Home Living                                          Prior Functioning/Environment  Frequency  Min 2X/week        Progress Toward Goals  OT Goals(current goals can now be found in the care plan section)  Progress towards OT goals: Progressing toward goals  Acute Rehab OT Goals Patient Stated Goal: Get some sleep OT Goal Formulation: With patient/family Time For Goal Achievement: 09/05/17 Potential to Achieve Goals: Good ADL Goals Pt Will Perform Grooming: with modified independence;standing Pt Will Perform Upper Body Bathing: with modified independence;sitting Pt Will Perform Lower Body Bathing: sit to/from stand Pt Will Transfer to Toilet: with modified  independence;ambulating;bedside commode Pt Will Perform Toileting - Clothing Manipulation and hygiene: with modified independence  Plan Discharge plan remains appropriate    Co-evaluation                 AM-PAC PT "6 Clicks" Daily Activity     Outcome Measure   Help from another person eating meals?: None Help from another person taking care of personal grooming?: A Little Help from another person toileting, which includes using toliet, bedpan, or urinal?: A Lot Help from another person bathing (including washing, rinsing, drying)?: A Lot Help from another person to put on and taking off regular upper body clothing?: A Little Help from another person to put on and taking off regular lower body clothing?: A Lot 6 Click Score: 16    End of Session    OT Visit Diagnosis: Unsteadiness on feet (R26.81);Muscle weakness (generalized) (M62.81);Low vision, both eyes (H54.2)   Activity Tolerance Patient limited by fatigue   Patient Left in chair;with call bell/phone within reach   Nurse Communication          Time: [phone] OT Time Calculation (min): 16 min  Charges: OT General Charges $OT Visit: 1 Visit OT Treatments $Self Care/Home Management : 8-22 mins     Hortencia Pilar 08/29/2017, 10:27 AM

## 2017-08-30 ENCOUNTER — Ambulatory Visit (HOSPITAL_COMMUNITY): Payer: Medicare Other | Admitting: Internal Medicine

## 2017-08-30 ENCOUNTER — Other Ambulatory Visit (HOSPITAL_COMMUNITY): Payer: Medicare Other

## 2017-08-30 DIAGNOSIS — J449 Chronic obstructive pulmonary disease, unspecified: Secondary | ICD-10-CM | POA: Diagnosis not present

## 2017-08-30 DIAGNOSIS — M329 Systemic lupus erythematosus, unspecified: Secondary | ICD-10-CM | POA: Diagnosis not present

## 2017-08-30 DIAGNOSIS — H3530 Unspecified macular degeneration: Secondary | ICD-10-CM | POA: Diagnosis not present

## 2017-08-30 DIAGNOSIS — D469 Myelodysplastic syndrome, unspecified: Secondary | ICD-10-CM | POA: Diagnosis not present

## 2017-08-30 DIAGNOSIS — R110 Nausea: Secondary | ICD-10-CM | POA: Diagnosis not present

## 2017-08-30 DIAGNOSIS — I10 Essential (primary) hypertension: Secondary | ICD-10-CM | POA: Diagnosis not present

## 2017-08-30 DIAGNOSIS — R1310 Dysphagia, unspecified: Secondary | ICD-10-CM | POA: Diagnosis not present

## 2017-08-30 DIAGNOSIS — R531 Weakness: Secondary | ICD-10-CM | POA: Diagnosis not present

## 2017-08-30 DIAGNOSIS — R52 Pain, unspecified: Secondary | ICD-10-CM | POA: Diagnosis not present

## 2017-08-30 DIAGNOSIS — M797 Fibromyalgia: Secondary | ICD-10-CM | POA: Diagnosis not present

## 2017-08-30 DIAGNOSIS — R0602 Shortness of breath: Secondary | ICD-10-CM | POA: Diagnosis not present

## 2017-08-30 LAB — PREPARE PLATELET PHERESIS: Unit division: 0

## 2017-08-30 LAB — BPAM PLATELET PHERESIS
Blood Product Expiration Date: 201908141118
ISSUE DATE / TIME: 201908131221
UNIT TYPE AND RH: 600

## 2017-08-31 DIAGNOSIS — J449 Chronic obstructive pulmonary disease, unspecified: Secondary | ICD-10-CM | POA: Diagnosis not present

## 2017-08-31 DIAGNOSIS — M329 Systemic lupus erythematosus, unspecified: Secondary | ICD-10-CM | POA: Diagnosis not present

## 2017-08-31 DIAGNOSIS — D469 Myelodysplastic syndrome, unspecified: Secondary | ICD-10-CM | POA: Diagnosis not present

## 2017-08-31 DIAGNOSIS — H3530 Unspecified macular degeneration: Secondary | ICD-10-CM | POA: Diagnosis not present

## 2017-08-31 DIAGNOSIS — M797 Fibromyalgia: Secondary | ICD-10-CM | POA: Diagnosis not present

## 2017-08-31 DIAGNOSIS — I10 Essential (primary) hypertension: Secondary | ICD-10-CM | POA: Diagnosis not present

## 2017-09-01 DIAGNOSIS — J449 Chronic obstructive pulmonary disease, unspecified: Secondary | ICD-10-CM | POA: Diagnosis not present

## 2017-09-01 DIAGNOSIS — D469 Myelodysplastic syndrome, unspecified: Secondary | ICD-10-CM | POA: Diagnosis not present

## 2017-09-01 DIAGNOSIS — H3530 Unspecified macular degeneration: Secondary | ICD-10-CM | POA: Diagnosis not present

## 2017-09-01 DIAGNOSIS — M797 Fibromyalgia: Secondary | ICD-10-CM | POA: Diagnosis not present

## 2017-09-01 DIAGNOSIS — M329 Systemic lupus erythematosus, unspecified: Secondary | ICD-10-CM | POA: Diagnosis not present

## 2017-09-01 DIAGNOSIS — I10 Essential (primary) hypertension: Secondary | ICD-10-CM | POA: Diagnosis not present

## 2017-09-06 DIAGNOSIS — M329 Systemic lupus erythematosus, unspecified: Secondary | ICD-10-CM | POA: Diagnosis not present

## 2017-09-06 DIAGNOSIS — J449 Chronic obstructive pulmonary disease, unspecified: Secondary | ICD-10-CM | POA: Diagnosis not present

## 2017-09-06 DIAGNOSIS — D469 Myelodysplastic syndrome, unspecified: Secondary | ICD-10-CM | POA: Diagnosis not present

## 2017-09-06 DIAGNOSIS — H3530 Unspecified macular degeneration: Secondary | ICD-10-CM | POA: Diagnosis not present

## 2017-09-06 DIAGNOSIS — I10 Essential (primary) hypertension: Secondary | ICD-10-CM | POA: Diagnosis not present

## 2017-09-06 DIAGNOSIS — M797 Fibromyalgia: Secondary | ICD-10-CM | POA: Diagnosis not present

## 2017-09-08 DIAGNOSIS — M329 Systemic lupus erythematosus, unspecified: Secondary | ICD-10-CM | POA: Diagnosis not present

## 2017-09-08 DIAGNOSIS — I10 Essential (primary) hypertension: Secondary | ICD-10-CM | POA: Diagnosis not present

## 2017-09-08 DIAGNOSIS — M797 Fibromyalgia: Secondary | ICD-10-CM | POA: Diagnosis not present

## 2017-09-08 DIAGNOSIS — D469 Myelodysplastic syndrome, unspecified: Secondary | ICD-10-CM | POA: Diagnosis not present

## 2017-09-08 DIAGNOSIS — J449 Chronic obstructive pulmonary disease, unspecified: Secondary | ICD-10-CM | POA: Diagnosis not present

## 2017-09-08 DIAGNOSIS — H3530 Unspecified macular degeneration: Secondary | ICD-10-CM | POA: Diagnosis not present

## 2017-09-13 DIAGNOSIS — J449 Chronic obstructive pulmonary disease, unspecified: Secondary | ICD-10-CM | POA: Diagnosis not present

## 2017-09-13 DIAGNOSIS — I10 Essential (primary) hypertension: Secondary | ICD-10-CM | POA: Diagnosis not present

## 2017-09-13 DIAGNOSIS — M797 Fibromyalgia: Secondary | ICD-10-CM | POA: Diagnosis not present

## 2017-09-13 DIAGNOSIS — H3530 Unspecified macular degeneration: Secondary | ICD-10-CM | POA: Diagnosis not present

## 2017-09-13 DIAGNOSIS — D469 Myelodysplastic syndrome, unspecified: Secondary | ICD-10-CM | POA: Diagnosis not present

## 2017-09-13 DIAGNOSIS — M329 Systemic lupus erythematosus, unspecified: Secondary | ICD-10-CM | POA: Diagnosis not present

## 2017-09-14 DIAGNOSIS — I10 Essential (primary) hypertension: Secondary | ICD-10-CM | POA: Diagnosis not present

## 2017-09-14 DIAGNOSIS — M329 Systemic lupus erythematosus, unspecified: Secondary | ICD-10-CM | POA: Diagnosis not present

## 2017-09-14 DIAGNOSIS — M797 Fibromyalgia: Secondary | ICD-10-CM | POA: Diagnosis not present

## 2017-09-14 DIAGNOSIS — J449 Chronic obstructive pulmonary disease, unspecified: Secondary | ICD-10-CM | POA: Diagnosis not present

## 2017-09-14 DIAGNOSIS — H3530 Unspecified macular degeneration: Secondary | ICD-10-CM | POA: Diagnosis not present

## 2017-09-14 DIAGNOSIS — D469 Myelodysplastic syndrome, unspecified: Secondary | ICD-10-CM | POA: Diagnosis not present

## 2017-09-15 DIAGNOSIS — J449 Chronic obstructive pulmonary disease, unspecified: Secondary | ICD-10-CM | POA: Diagnosis not present

## 2017-09-15 DIAGNOSIS — I10 Essential (primary) hypertension: Secondary | ICD-10-CM | POA: Diagnosis not present

## 2017-09-15 DIAGNOSIS — H3530 Unspecified macular degeneration: Secondary | ICD-10-CM | POA: Diagnosis not present

## 2017-09-15 DIAGNOSIS — M329 Systemic lupus erythematosus, unspecified: Secondary | ICD-10-CM | POA: Diagnosis not present

## 2017-09-15 DIAGNOSIS — M797 Fibromyalgia: Secondary | ICD-10-CM | POA: Diagnosis not present

## 2017-09-15 DIAGNOSIS — D469 Myelodysplastic syndrome, unspecified: Secondary | ICD-10-CM | POA: Diagnosis not present

## 2017-09-17 DIAGNOSIS — R0602 Shortness of breath: Secondary | ICD-10-CM | POA: Diagnosis not present

## 2017-09-17 DIAGNOSIS — R52 Pain, unspecified: Secondary | ICD-10-CM | POA: Diagnosis not present

## 2017-09-17 DIAGNOSIS — I10 Essential (primary) hypertension: Secondary | ICD-10-CM | POA: Diagnosis not present

## 2017-09-17 DIAGNOSIS — J449 Chronic obstructive pulmonary disease, unspecified: Secondary | ICD-10-CM | POA: Diagnosis not present

## 2017-09-17 DIAGNOSIS — R1310 Dysphagia, unspecified: Secondary | ICD-10-CM | POA: Diagnosis not present

## 2017-09-17 DIAGNOSIS — R110 Nausea: Secondary | ICD-10-CM | POA: Diagnosis not present

## 2017-09-17 DIAGNOSIS — M797 Fibromyalgia: Secondary | ICD-10-CM | POA: Diagnosis not present

## 2017-09-17 DIAGNOSIS — M329 Systemic lupus erythematosus, unspecified: Secondary | ICD-10-CM | POA: Diagnosis not present

## 2017-09-17 DIAGNOSIS — R531 Weakness: Secondary | ICD-10-CM | POA: Diagnosis not present

## 2017-09-17 DIAGNOSIS — D469 Myelodysplastic syndrome, unspecified: Secondary | ICD-10-CM | POA: Diagnosis not present

## 2017-09-17 DIAGNOSIS — H3530 Unspecified macular degeneration: Secondary | ICD-10-CM | POA: Diagnosis not present

## 2017-09-22 DIAGNOSIS — M797 Fibromyalgia: Secondary | ICD-10-CM | POA: Diagnosis not present

## 2017-09-22 DIAGNOSIS — J449 Chronic obstructive pulmonary disease, unspecified: Secondary | ICD-10-CM | POA: Diagnosis not present

## 2017-09-22 DIAGNOSIS — I10 Essential (primary) hypertension: Secondary | ICD-10-CM | POA: Diagnosis not present

## 2017-09-22 DIAGNOSIS — M329 Systemic lupus erythematosus, unspecified: Secondary | ICD-10-CM | POA: Diagnosis not present

## 2017-09-22 DIAGNOSIS — D469 Myelodysplastic syndrome, unspecified: Secondary | ICD-10-CM | POA: Diagnosis not present

## 2017-09-22 DIAGNOSIS — H3530 Unspecified macular degeneration: Secondary | ICD-10-CM | POA: Diagnosis not present

## 2017-09-27 DIAGNOSIS — M329 Systemic lupus erythematosus, unspecified: Secondary | ICD-10-CM | POA: Diagnosis not present

## 2017-09-27 DIAGNOSIS — I10 Essential (primary) hypertension: Secondary | ICD-10-CM | POA: Diagnosis not present

## 2017-09-27 DIAGNOSIS — J449 Chronic obstructive pulmonary disease, unspecified: Secondary | ICD-10-CM | POA: Diagnosis not present

## 2017-09-27 DIAGNOSIS — D469 Myelodysplastic syndrome, unspecified: Secondary | ICD-10-CM | POA: Diagnosis not present

## 2017-09-27 DIAGNOSIS — H3530 Unspecified macular degeneration: Secondary | ICD-10-CM | POA: Diagnosis not present

## 2017-09-27 DIAGNOSIS — M797 Fibromyalgia: Secondary | ICD-10-CM | POA: Diagnosis not present

## 2017-09-28 DIAGNOSIS — I10 Essential (primary) hypertension: Secondary | ICD-10-CM | POA: Diagnosis not present

## 2017-09-28 DIAGNOSIS — H3530 Unspecified macular degeneration: Secondary | ICD-10-CM | POA: Diagnosis not present

## 2017-09-28 DIAGNOSIS — J449 Chronic obstructive pulmonary disease, unspecified: Secondary | ICD-10-CM | POA: Diagnosis not present

## 2017-09-28 DIAGNOSIS — D469 Myelodysplastic syndrome, unspecified: Secondary | ICD-10-CM | POA: Diagnosis not present

## 2017-09-28 DIAGNOSIS — M797 Fibromyalgia: Secondary | ICD-10-CM | POA: Diagnosis not present

## 2017-09-28 DIAGNOSIS — M329 Systemic lupus erythematosus, unspecified: Secondary | ICD-10-CM | POA: Diagnosis not present

## 2017-09-29 DIAGNOSIS — I10 Essential (primary) hypertension: Secondary | ICD-10-CM | POA: Diagnosis not present

## 2017-09-29 DIAGNOSIS — M329 Systemic lupus erythematosus, unspecified: Secondary | ICD-10-CM | POA: Diagnosis not present

## 2017-09-29 DIAGNOSIS — H3530 Unspecified macular degeneration: Secondary | ICD-10-CM | POA: Diagnosis not present

## 2017-09-29 DIAGNOSIS — D469 Myelodysplastic syndrome, unspecified: Secondary | ICD-10-CM | POA: Diagnosis not present

## 2017-09-29 DIAGNOSIS — M797 Fibromyalgia: Secondary | ICD-10-CM | POA: Diagnosis not present

## 2017-09-29 DIAGNOSIS — J449 Chronic obstructive pulmonary disease, unspecified: Secondary | ICD-10-CM | POA: Diagnosis not present

## 2017-10-02 ENCOUNTER — Ambulatory Visit: Payer: Medicare Other | Admitting: Radiation Oncology

## 2017-10-03 DIAGNOSIS — I10 Essential (primary) hypertension: Secondary | ICD-10-CM | POA: Diagnosis not present

## 2017-10-03 DIAGNOSIS — M329 Systemic lupus erythematosus, unspecified: Secondary | ICD-10-CM | POA: Diagnosis not present

## 2017-10-03 DIAGNOSIS — H3530 Unspecified macular degeneration: Secondary | ICD-10-CM | POA: Diagnosis not present

## 2017-10-03 DIAGNOSIS — D469 Myelodysplastic syndrome, unspecified: Secondary | ICD-10-CM | POA: Diagnosis not present

## 2017-10-03 DIAGNOSIS — M797 Fibromyalgia: Secondary | ICD-10-CM | POA: Diagnosis not present

## 2017-10-03 DIAGNOSIS — J449 Chronic obstructive pulmonary disease, unspecified: Secondary | ICD-10-CM | POA: Diagnosis not present

## 2017-10-04 DIAGNOSIS — J449 Chronic obstructive pulmonary disease, unspecified: Secondary | ICD-10-CM | POA: Diagnosis not present

## 2017-10-04 DIAGNOSIS — I10 Essential (primary) hypertension: Secondary | ICD-10-CM | POA: Diagnosis not present

## 2017-10-04 DIAGNOSIS — H3530 Unspecified macular degeneration: Secondary | ICD-10-CM | POA: Diagnosis not present

## 2017-10-04 DIAGNOSIS — M329 Systemic lupus erythematosus, unspecified: Secondary | ICD-10-CM | POA: Diagnosis not present

## 2017-10-04 DIAGNOSIS — D469 Myelodysplastic syndrome, unspecified: Secondary | ICD-10-CM | POA: Diagnosis not present

## 2017-10-04 DIAGNOSIS — M797 Fibromyalgia: Secondary | ICD-10-CM | POA: Diagnosis not present

## 2017-10-06 DIAGNOSIS — D469 Myelodysplastic syndrome, unspecified: Secondary | ICD-10-CM | POA: Diagnosis not present

## 2017-10-06 DIAGNOSIS — M329 Systemic lupus erythematosus, unspecified: Secondary | ICD-10-CM | POA: Diagnosis not present

## 2017-10-06 DIAGNOSIS — I10 Essential (primary) hypertension: Secondary | ICD-10-CM | POA: Diagnosis not present

## 2017-10-06 DIAGNOSIS — M797 Fibromyalgia: Secondary | ICD-10-CM | POA: Diagnosis not present

## 2017-10-06 DIAGNOSIS — J449 Chronic obstructive pulmonary disease, unspecified: Secondary | ICD-10-CM | POA: Diagnosis not present

## 2017-10-06 DIAGNOSIS — H3530 Unspecified macular degeneration: Secondary | ICD-10-CM | POA: Diagnosis not present

## 2017-10-10 DIAGNOSIS — J449 Chronic obstructive pulmonary disease, unspecified: Secondary | ICD-10-CM | POA: Diagnosis not present

## 2017-10-10 DIAGNOSIS — M329 Systemic lupus erythematosus, unspecified: Secondary | ICD-10-CM | POA: Diagnosis not present

## 2017-10-10 DIAGNOSIS — H3530 Unspecified macular degeneration: Secondary | ICD-10-CM | POA: Diagnosis not present

## 2017-10-10 DIAGNOSIS — I10 Essential (primary) hypertension: Secondary | ICD-10-CM | POA: Diagnosis not present

## 2017-10-10 DIAGNOSIS — M797 Fibromyalgia: Secondary | ICD-10-CM | POA: Diagnosis not present

## 2017-10-10 DIAGNOSIS — D469 Myelodysplastic syndrome, unspecified: Secondary | ICD-10-CM | POA: Diagnosis not present

## 2017-10-11 DIAGNOSIS — D469 Myelodysplastic syndrome, unspecified: Secondary | ICD-10-CM | POA: Diagnosis not present

## 2017-10-11 DIAGNOSIS — I10 Essential (primary) hypertension: Secondary | ICD-10-CM | POA: Diagnosis not present

## 2017-10-11 DIAGNOSIS — J449 Chronic obstructive pulmonary disease, unspecified: Secondary | ICD-10-CM | POA: Diagnosis not present

## 2017-10-11 DIAGNOSIS — M329 Systemic lupus erythematosus, unspecified: Secondary | ICD-10-CM | POA: Diagnosis not present

## 2017-10-11 DIAGNOSIS — H3530 Unspecified macular degeneration: Secondary | ICD-10-CM | POA: Diagnosis not present

## 2017-10-11 DIAGNOSIS — M797 Fibromyalgia: Secondary | ICD-10-CM | POA: Diagnosis not present

## 2017-10-12 DIAGNOSIS — M329 Systemic lupus erythematosus, unspecified: Secondary | ICD-10-CM | POA: Diagnosis not present

## 2017-10-12 DIAGNOSIS — I10 Essential (primary) hypertension: Secondary | ICD-10-CM | POA: Diagnosis not present

## 2017-10-12 DIAGNOSIS — D469 Myelodysplastic syndrome, unspecified: Secondary | ICD-10-CM | POA: Diagnosis not present

## 2017-10-12 DIAGNOSIS — M797 Fibromyalgia: Secondary | ICD-10-CM | POA: Diagnosis not present

## 2017-10-12 DIAGNOSIS — J449 Chronic obstructive pulmonary disease, unspecified: Secondary | ICD-10-CM | POA: Diagnosis not present

## 2017-10-12 DIAGNOSIS — H3530 Unspecified macular degeneration: Secondary | ICD-10-CM | POA: Diagnosis not present

## 2017-10-17 DIAGNOSIS — R110 Nausea: Secondary | ICD-10-CM | POA: Diagnosis not present

## 2017-10-17 DIAGNOSIS — H3530 Unspecified macular degeneration: Secondary | ICD-10-CM | POA: Diagnosis not present

## 2017-10-17 DIAGNOSIS — I10 Essential (primary) hypertension: Secondary | ICD-10-CM | POA: Diagnosis not present

## 2017-10-17 DIAGNOSIS — R531 Weakness: Secondary | ICD-10-CM | POA: Diagnosis not present

## 2017-10-17 DIAGNOSIS — M797 Fibromyalgia: Secondary | ICD-10-CM | POA: Diagnosis not present

## 2017-10-17 DIAGNOSIS — R0602 Shortness of breath: Secondary | ICD-10-CM | POA: Diagnosis not present

## 2017-10-17 DIAGNOSIS — D469 Myelodysplastic syndrome, unspecified: Secondary | ICD-10-CM | POA: Diagnosis not present

## 2017-10-17 DIAGNOSIS — R1310 Dysphagia, unspecified: Secondary | ICD-10-CM | POA: Diagnosis not present

## 2017-10-17 DIAGNOSIS — J449 Chronic obstructive pulmonary disease, unspecified: Secondary | ICD-10-CM | POA: Diagnosis not present

## 2017-10-17 DIAGNOSIS — M329 Systemic lupus erythematosus, unspecified: Secondary | ICD-10-CM | POA: Diagnosis not present

## 2017-10-17 DIAGNOSIS — R52 Pain, unspecified: Secondary | ICD-10-CM | POA: Diagnosis not present

## 2017-10-18 DIAGNOSIS — J449 Chronic obstructive pulmonary disease, unspecified: Secondary | ICD-10-CM | POA: Diagnosis not present

## 2017-10-18 DIAGNOSIS — H3530 Unspecified macular degeneration: Secondary | ICD-10-CM | POA: Diagnosis not present

## 2017-10-18 DIAGNOSIS — M797 Fibromyalgia: Secondary | ICD-10-CM | POA: Diagnosis not present

## 2017-10-18 DIAGNOSIS — D469 Myelodysplastic syndrome, unspecified: Secondary | ICD-10-CM | POA: Diagnosis not present

## 2017-10-18 DIAGNOSIS — M329 Systemic lupus erythematosus, unspecified: Secondary | ICD-10-CM | POA: Diagnosis not present

## 2017-10-18 DIAGNOSIS — I10 Essential (primary) hypertension: Secondary | ICD-10-CM | POA: Diagnosis not present

## 2017-10-19 DIAGNOSIS — D469 Myelodysplastic syndrome, unspecified: Secondary | ICD-10-CM | POA: Diagnosis not present

## 2017-10-19 DIAGNOSIS — J449 Chronic obstructive pulmonary disease, unspecified: Secondary | ICD-10-CM | POA: Diagnosis not present

## 2017-10-19 DIAGNOSIS — M329 Systemic lupus erythematosus, unspecified: Secondary | ICD-10-CM | POA: Diagnosis not present

## 2017-10-19 DIAGNOSIS — I10 Essential (primary) hypertension: Secondary | ICD-10-CM | POA: Diagnosis not present

## 2017-10-19 DIAGNOSIS — H3530 Unspecified macular degeneration: Secondary | ICD-10-CM | POA: Diagnosis not present

## 2017-10-19 DIAGNOSIS — M797 Fibromyalgia: Secondary | ICD-10-CM | POA: Diagnosis not present

## 2017-10-24 DIAGNOSIS — I10 Essential (primary) hypertension: Secondary | ICD-10-CM | POA: Diagnosis not present

## 2017-10-24 DIAGNOSIS — D469 Myelodysplastic syndrome, unspecified: Secondary | ICD-10-CM | POA: Diagnosis not present

## 2017-10-24 DIAGNOSIS — H3530 Unspecified macular degeneration: Secondary | ICD-10-CM | POA: Diagnosis not present

## 2017-10-24 DIAGNOSIS — M329 Systemic lupus erythematosus, unspecified: Secondary | ICD-10-CM | POA: Diagnosis not present

## 2017-10-24 DIAGNOSIS — M797 Fibromyalgia: Secondary | ICD-10-CM | POA: Diagnosis not present

## 2017-10-24 DIAGNOSIS — J449 Chronic obstructive pulmonary disease, unspecified: Secondary | ICD-10-CM | POA: Diagnosis not present

## 2017-10-26 DIAGNOSIS — H3530 Unspecified macular degeneration: Secondary | ICD-10-CM | POA: Diagnosis not present

## 2017-10-26 DIAGNOSIS — M329 Systemic lupus erythematosus, unspecified: Secondary | ICD-10-CM | POA: Diagnosis not present

## 2017-10-26 DIAGNOSIS — M797 Fibromyalgia: Secondary | ICD-10-CM | POA: Diagnosis not present

## 2017-10-26 DIAGNOSIS — D469 Myelodysplastic syndrome, unspecified: Secondary | ICD-10-CM | POA: Diagnosis not present

## 2017-10-26 DIAGNOSIS — I10 Essential (primary) hypertension: Secondary | ICD-10-CM | POA: Diagnosis not present

## 2017-10-26 DIAGNOSIS — J449 Chronic obstructive pulmonary disease, unspecified: Secondary | ICD-10-CM | POA: Diagnosis not present

## 2017-10-28 ENCOUNTER — Other Ambulatory Visit: Payer: Self-pay

## 2017-10-28 ENCOUNTER — Emergency Department (HOSPITAL_COMMUNITY)

## 2017-10-28 ENCOUNTER — Emergency Department (HOSPITAL_COMMUNITY)
Admission: EM | Admit: 2017-10-28 | Discharge: 2017-11-17 | Disposition: E | Attending: Family Medicine | Admitting: Family Medicine

## 2017-10-28 ENCOUNTER — Encounter (HOSPITAL_COMMUNITY): Payer: Self-pay | Admitting: Emergency Medicine

## 2017-10-28 DIAGNOSIS — C3401 Malignant neoplasm of right main bronchus: Secondary | ICD-10-CM | POA: Diagnosis not present

## 2017-10-28 DIAGNOSIS — J90 Pleural effusion, not elsewhere classified: Secondary | ICD-10-CM | POA: Diagnosis not present

## 2017-10-28 DIAGNOSIS — Z79899 Other long term (current) drug therapy: Secondary | ICD-10-CM | POA: Diagnosis not present

## 2017-10-28 DIAGNOSIS — H3530 Unspecified macular degeneration: Secondary | ICD-10-CM | POA: Diagnosis not present

## 2017-10-28 DIAGNOSIS — R0602 Shortness of breath: Secondary | ICD-10-CM | POA: Insufficient documentation

## 2017-10-28 DIAGNOSIS — M797 Fibromyalgia: Secondary | ICD-10-CM | POA: Diagnosis not present

## 2017-10-28 DIAGNOSIS — I10 Essential (primary) hypertension: Secondary | ICD-10-CM | POA: Diagnosis not present

## 2017-10-28 DIAGNOSIS — E119 Type 2 diabetes mellitus without complications: Secondary | ICD-10-CM | POA: Insufficient documentation

## 2017-10-28 DIAGNOSIS — C3490 Malignant neoplasm of unspecified part of unspecified bronchus or lung: Secondary | ICD-10-CM | POA: Diagnosis not present

## 2017-10-28 DIAGNOSIS — I959 Hypotension, unspecified: Secondary | ICD-10-CM | POA: Diagnosis not present

## 2017-10-28 DIAGNOSIS — D469 Myelodysplastic syndrome, unspecified: Secondary | ICD-10-CM | POA: Diagnosis not present

## 2017-10-28 DIAGNOSIS — D649 Anemia, unspecified: Secondary | ICD-10-CM | POA: Diagnosis not present

## 2017-10-28 DIAGNOSIS — J449 Chronic obstructive pulmonary disease, unspecified: Secondary | ICD-10-CM | POA: Diagnosis not present

## 2017-10-28 DIAGNOSIS — M329 Systemic lupus erythematosus, unspecified: Secondary | ICD-10-CM | POA: Diagnosis not present

## 2017-10-28 DIAGNOSIS — R0689 Other abnormalities of breathing: Secondary | ICD-10-CM | POA: Diagnosis not present

## 2017-10-28 DIAGNOSIS — F17210 Nicotine dependence, cigarettes, uncomplicated: Secondary | ICD-10-CM | POA: Insufficient documentation

## 2017-10-28 DIAGNOSIS — R509 Fever, unspecified: Secondary | ICD-10-CM | POA: Diagnosis not present

## 2017-10-28 DIAGNOSIS — R627 Adult failure to thrive: Secondary | ICD-10-CM | POA: Diagnosis present

## 2017-10-28 DIAGNOSIS — R000 Tachycardia, unspecified: Secondary | ICD-10-CM | POA: Diagnosis not present

## 2017-10-28 HISTORY — DX: Encounter for palliative care: Z51.5

## 2017-10-28 HISTORY — DX: Do not resuscitate: Z66

## 2017-10-28 LAB — CBC WITH DIFFERENTIAL/PLATELET
Abs Immature Granulocytes: 0.06 10*3/uL (ref 0.00–0.07)
BASOS ABS: 0 10*3/uL (ref 0.0–0.1)
Basophils Relative: 0 %
Eosinophils Absolute: 0 10*3/uL (ref 0.0–0.5)
Eosinophils Relative: 1 %
HCT: 14.2 % — ABNORMAL LOW (ref 36.0–46.0)
Hemoglobin: 3.9 g/dL — CL (ref 12.0–15.0)
Immature Granulocytes: 1 %
Lymphocytes Relative: 10 %
Lymphs Abs: 0.8 10*3/uL (ref 0.7–4.0)
MCH: 32.8 pg (ref 26.0–34.0)
MCHC: 27.5 g/dL — ABNORMAL LOW (ref 30.0–36.0)
MCV: 119.3 fL — AB (ref 80.0–100.0)
MONO ABS: 0.6 10*3/uL (ref 0.1–1.0)
Monocytes Relative: 8 %
Neutro Abs: 6.8 10*3/uL (ref 1.7–7.7)
Neutrophils Relative %: 80 %
PLATELETS: 13 10*3/uL — AB (ref 150–400)
RBC: 1.19 MIL/uL — AB (ref 3.87–5.11)
RDW: 24.4 % — ABNORMAL HIGH (ref 11.5–15.5)
WBC: 8.3 10*3/uL (ref 4.0–10.5)
nRBC: 0.5 % — ABNORMAL HIGH (ref 0.0–0.2)

## 2017-10-28 LAB — COMPREHENSIVE METABOLIC PANEL
ALBUMIN: 3 g/dL — AB (ref 3.5–5.0)
ALT: 25 U/L (ref 0–44)
AST: 31 U/L (ref 15–41)
Alkaline Phosphatase: 62 U/L (ref 38–126)
Anion gap: 20 — ABNORMAL HIGH (ref 5–15)
BUN: 38 mg/dL — AB (ref 8–23)
CHLORIDE: 96 mmol/L — AB (ref 98–111)
CO2: 12 mmol/L — ABNORMAL LOW (ref 22–32)
CREATININE: 1.83 mg/dL — AB (ref 0.44–1.00)
Calcium: 8.4 mg/dL — ABNORMAL LOW (ref 8.9–10.3)
GFR calc Af Amer: 30 mL/min — ABNORMAL LOW (ref >60)
GFR calc non Af Amer: 25 mL/min — ABNORMAL LOW (ref >60)
GLUCOSE: 266 mg/dL — AB (ref 70–99)
Potassium: 5.9 mmol/L — ABNORMAL HIGH (ref 3.5–5.1)
SODIUM: 128 mmol/L — AB (ref 135–145)
Total Bilirubin: 1.6 mg/dL — ABNORMAL HIGH (ref 0.3–1.2)
Total Protein: 7 g/dL (ref 6.5–8.1)

## 2017-10-28 LAB — BRAIN NATRIURETIC PEPTIDE: B Natriuretic Peptide: 702 pg/mL — ABNORMAL HIGH (ref 0.0–100.0)

## 2017-10-28 LAB — LACTIC ACID, PLASMA: Lactic Acid, Venous: 13.1 mmol/L (ref 0.5–1.9)

## 2017-10-28 LAB — TROPONIN I: Troponin I: 0.05 ng/mL (ref <0.03)

## 2017-10-28 MED ORDER — MORPHINE SULFATE (CONCENTRATE) 10 MG/0.5ML PO SOLN
5.0000 mg | ORAL | Status: DC | PRN
  Administered 2017-10-28: 5 mg via SUBLINGUAL
  Filled 2017-10-28: qty 0.5

## 2017-10-28 MED ORDER — ONDANSETRON HCL 4 MG/2ML IJ SOLN
4.0000 mg | Freq: Once | INTRAMUSCULAR | Status: AC
  Administered 2017-10-28: 4 mg via INTRAVENOUS
  Filled 2017-10-28: qty 2

## 2017-10-28 MED ORDER — BIOTENE DRY MOUTH MT LIQD: 15.0000 mL | OROMUCOSAL | Status: DC | PRN

## 2017-10-28 MED ORDER — GLYCOPYRROLATE 0.2 MG/ML IJ SOLN: 0.2000 mg | INTRAMUSCULAR | Status: DC | PRN

## 2017-10-28 MED ORDER — ONDANSETRON 4 MG PO TBDP: 4.0000 mg | ORAL_TABLET | Freq: Four times a day (QID) | ORAL | Status: DC | PRN

## 2017-10-28 MED ORDER — GLYCOPYRROLATE 1 MG PO TABS: 1.0000 mg | ORAL_TABLET | ORAL | Status: DC | PRN

## 2017-10-28 MED ORDER — DIPHENHYDRAMINE HCL 50 MG/ML IJ SOLN: 12.5000 mg | INTRAMUSCULAR | Status: DC | PRN

## 2017-10-28 MED ORDER — ALBUTEROL SULFATE (2.5 MG/3ML) 0.083% IN NEBU: 2.5000 mg | INHALATION_SOLUTION | RESPIRATORY_TRACT | Status: DC | PRN

## 2017-10-28 MED ORDER — ALBUTEROL SULFATE (2.5 MG/3ML) 0.083% IN NEBU
2.5000 mg | INHALATION_SOLUTION | Freq: Once | RESPIRATORY_TRACT | Status: DC
  Filled 2017-10-28: qty 3

## 2017-10-28 MED ORDER — IPRATROPIUM-ALBUTEROL 0.5-2.5 (3) MG/3ML IN SOLN
3.0000 mL | Freq: Once | RESPIRATORY_TRACT | Status: DC
  Filled 2017-10-28: qty 3

## 2017-10-28 MED ORDER — HALOPERIDOL 0.5 MG PO TABS: 0.5000 mg | ORAL_TABLET | ORAL | Status: DC | PRN

## 2017-10-28 MED ORDER — ALUM & MAG HYDROXIDE-SIMETH 200-200-20 MG/5ML PO SUSP: 30.0000 mL | Freq: Four times a day (QID) | ORAL | Status: DC | PRN

## 2017-10-28 MED ORDER — POLYVINYL ALCOHOL 1.4 % OP SOLN: 1.0000 [drp] | Freq: Four times a day (QID) | OPHTHALMIC | Status: DC | PRN

## 2017-10-28 MED ORDER — LORAZEPAM 2 MG/ML IJ SOLN
1.0000 mg | INTRAMUSCULAR | Status: DC | PRN
  Administered 2017-10-28: 1 mg via INTRAVENOUS
  Filled 2017-10-28: qty 1

## 2017-10-28 MED ORDER — HYDROMORPHONE HCL 1 MG/ML IJ SOLN
0.5000 mg | Freq: Once | INTRAMUSCULAR | Status: AC
  Administered 2017-10-28: 0.5 mg via INTRAVENOUS
  Filled 2017-10-28: qty 1

## 2017-10-28 MED ORDER — LORAZEPAM 1 MG PO TABS: 1.0000 mg | ORAL_TABLET | ORAL | Status: DC | PRN

## 2017-10-28 MED ORDER — ACETAMINOPHEN 325 MG PO TABS: 650.0000 mg | ORAL_TABLET | Freq: Four times a day (QID) | ORAL | Status: DC | PRN

## 2017-10-28 MED ORDER — LORAZEPAM 2 MG/ML PO CONC
1.0000 mg | ORAL | Status: DC | PRN
  Filled 2017-10-28: qty 0.5

## 2017-10-28 MED ORDER — FUROSEMIDE 10 MG/ML IJ SOLN
20.0000 mg | Freq: Once | INTRAMUSCULAR | Status: AC
  Administered 2017-10-28: 20 mg via INTRAVENOUS
  Filled 2017-10-28: qty 2

## 2017-10-28 MED ORDER — METOCLOPRAMIDE HCL 5 MG/ML IJ SOLN
10.0000 mg | Freq: Once | INTRAMUSCULAR | Status: AC
  Administered 2017-10-28: 10 mg via INTRAVENOUS
  Filled 2017-10-28: qty 2

## 2017-10-28 MED ORDER — MORPHINE SULFATE (CONCENTRATE) 10 MG/0.5ML PO SOLN: 5.0000 mg | ORAL | Status: DC | PRN

## 2017-10-28 MED ORDER — ONDANSETRON HCL 4 MG/2ML IJ SOLN: 4.0000 mg | Freq: Four times a day (QID) | INTRAMUSCULAR | Status: DC | PRN

## 2017-10-28 MED ORDER — SODIUM CHLORIDE 0.9% IV SOLUTION: Freq: Once | INTRAVENOUS | Status: DC

## 2017-10-28 MED ORDER — ACETAMINOPHEN 650 MG RE SUPP: 650.0000 mg | Freq: Four times a day (QID) | RECTAL | Status: DC | PRN

## 2017-10-28 MED ORDER — HALOPERIDOL LACTATE 2 MG/ML PO CONC: 0.5000 mg | ORAL | Status: DC | PRN

## 2017-10-28 MED ORDER — BACLOFEN 10 MG PO TABS: 5.0000 mg | ORAL_TABLET | Freq: Two times a day (BID) | ORAL | Status: DC | PRN

## 2017-10-28 MED ORDER — HALOPERIDOL LACTATE 5 MG/ML IJ SOLN: 0.5000 mg | INTRAMUSCULAR | Status: DC | PRN

## 2017-10-28 MED ORDER — PROCHLORPERAZINE EDISYLATE 10 MG/2ML IJ SOLN
10.0000 mg | INTRAMUSCULAR | Status: DC | PRN
  Administered 2017-10-28: 10 mg via INTRAVENOUS
  Filled 2017-10-28 (×2): qty 2

## 2017-10-29 LAB — TYPE AND SCREEN
ABO/RH(D): O POS
ANTIBODY SCREEN: NEGATIVE
UNIT DIVISION: 0

## 2017-10-29 LAB — BPAM RBC
Blood Product Expiration Date: 201911142359
Unit Type and Rh: 9500

## 2017-11-17 NOTE — ED Notes (Addendum)
Hospice admission approved by Azalia Bilis RN.  Her number is [phone]

## 2017-11-17 NOTE — ED Notes (Signed)
CRITICAL VALUE ALERT  Critical Value:  Hgb 3.9, Platelet 13, Troponin 0.05  Date & Time Notied:  10/30/2017 @ 7373  Provider Notified: Dr Roderic Palau  Orders Received/Actions taken: Patient Hospice/DNR, Dr Roderic Palau to room to talk to patient and family

## 2017-11-17 NOTE — ED Notes (Signed)
Entered room to attempt 2nd iv access for blood.  Granddaughter expressed concern over this as pt did not want any additional sticks.  Explained to pt and granddaughter the severity of the situation and the potential risks of blood administration at this point.  Granddaughter and pt agreed not to receive blood at this time and focused on comfort care as initially discussed with palliative care at last admission.  Discussed the high probability of death at this point related to comfort measures including pain control and no transfusion.  Pt and family in agreement with this and continue to express wishes of no additional aggressive measures.  Relayed this information to Dr. Roderic Palau and family expressed thanks of further explanation of plan of care.

## 2017-11-17 NOTE — ED Notes (Signed)
Resp paged for breathing treatment.  

## 2017-11-17 NOTE — ED Triage Notes (Signed)
Pt brought in today at request of hospice nurse.  She went to home twice today, first for hypoglycemia and second for respiratory distress.  Family requested to go to hospice home, but they are full so pt was sent here for eval.

## 2017-11-17 NOTE — ED Notes (Signed)
Time of death [facility] called and will contact field nurse to take over.

## 2017-11-17 NOTE — Discharge Summary (Signed)
     Death Summary  [name] FKC:[id number] DOB: [birth year] DOA: 11-02-17  PCP: Redmond School, MD  Admit date: 2017/11/02 Date of Death: 2017-11-02 Time of Death: 04/01/1912 Notification: Hospice Nov 02, 2017  History of present illness:  [name] is a 77 y.o. female with a history of lung cancer, chronic anemia secondary to chronic GI bleed. [name] presented with complaint of symptomatic anemia. She was sent here from home by her hospice nurse because the hospice home was full. [name] was placed on comfort measures and passed away shortly after admission.   Final Diagnoses:  1.   Symptomatic anemia 2. Chronic GI bleed 3. Lung Cancer   The results of significant diagnostics from this hospitalization (including imaging, microbiology, ancillary and laboratory) are listed below for reference.    Significant Diagnostic Studies: Dg Chest Portable 1 View  Result Date: 2017/11/02 CLINICAL DATA:  Patient with shortness of breath. EXAM: PORTABLE CHEST 1 VIEW COMPARISON:  Chest radiograph 08/20/2017 FINDINGS: Patient is rotated to the right. Monitoring leads overlie the patient. Stable prominent cardiac and mediastinal contours. Aortic atherosclerosis. Diffuse bilateral interstitial pulmonary opacities. Small bilateral pleural effusions, right-greater-than-left. IMPRESSION: Increased interstitial opacities favored to represent pulmonary edema. Small bilateral pleural effusions. Electronically Signed   By: Lovey Newcomer M.D.   On: 11/02/17 16:48    Microbiology: No results found for this or any previous visit (from the past 240 hour(s)).   Labs: Basic Metabolic Panel: Recent Labs  Lab Nov 02, 2017 1601  NA 128*  K 5.9*  CL 96*  CO2 12*  GLUCOSE 266*  BUN 38*  CREATININE 1.83*  CALCIUM 8.4*   Liver Function Tests: Recent Labs  Lab 11/02/17 1601  AST 31  ALT 25  ALKPHOS 62  BILITOT 1.6*  PROT 7.0  ALBUMIN 3.0*   No results for input(s): LIPASE,  AMYLASE in the last 168 hours. No results for input(s): AMMONIA in the last 168 hours. CBC: Recent Labs  Lab 11/02/2017 1601  WBC 8.3  NEUTROABS 6.8  HGB 3.9*  HCT 14.2*  MCV 119.3*  PLT 13*   Cardiac Enzymes: Recent Labs  Lab 11/02/2017 1601  TROPONINI 0.05*   D-Dimer No results for input(s): DDIMER in the last 72 hours. BNP: Invalid input(s): POCBNP CBG: No results for input(s): GLUCAP in the last 168 hours. Anemia work up No results for input(s): VITAMINB12, FOLATE, FERRITIN, TIBC, IRON, RETICCTPCT in the last 72 hours. Urinalysis    Component Value Date/Time   COLORURINE YELLOW 08/18/2017 2030   APPEARANCEUR HAZY (A) 08/18/2017 2030   LABSPEC 1.014 08/18/2017 2030   [place] 5.0 08/18/2017 2030   GLUCOSEU NEGATIVE 08/18/2017 2030   [place] NEGATIVE 08/18/2017 2030   [place] 08/18/2017 2030   [place] 08/18/2017 2030   PROTEINUR NEGATIVE 08/18/2017 2030   UROBILINOGEN 0.2 06/14/2011 1605   NITRITE NEGATIVE 08/18/2017 2030   LEUKOCYTESUR MODERATE (A) 08/18/2017 2030   Sepsis Labs Invalid input(s): PROCALCITONIN,  WBC,  LACTICIDVEN     SIGNED:  Truett Mainland, MD  [facility] 11-02-17, 7:52 PM Pager   If 7PM-7AM, please contact night-coverage [email or web address] Password [email or web address]

## 2017-11-17 NOTE — ED Provider Notes (Signed)
[facility] EMERGENCY DEPARTMENT Provider Note   CSN: [id number] Arrival date & time: 11/11/17  1538     History   Chief Complaint Chief Complaint  Patient presents with  . Failure To Thrive    HPI [name] is a 77 y.o. female.  Patient has a history of lung cancer her rheumatoid arthritis lupus myelodysplastic syndrome.  She was admitted to the hospital 2 months ago with upper GI bleed and severe anemia.  She was transfused 5 units at that time.  When she was discharged to the hospital she decided to become a DNR and did not want to be transfused again.  The hospice nurse has been to the house twice today and sent the patient to the emergency department because there is no room in the hospice house.  The history is provided by the patient and a relative. No language interpreter was used.  Illness  This is a chronic problem. The current episode started more than 2 days ago. The problem occurs constantly. The problem has not changed since onset.Associated symptoms include shortness of breath. Pertinent negatives include no chest pain, no abdominal pain and no headaches. Nothing aggravates the symptoms. Nothing relieves the symptoms. She has tried nothing for the symptoms. The treatment provided no relief.    Past Medical History:  Diagnosis Date  . Diabetes mellitus    x 20 yrs  . DNR (do not resuscitate) 08/2017  . Fibromyalgia   . Fibromyalgia   . Hospice care 08/2017  . Lung cancer ([place]) 01/28/2015   left upper lobe lung /right upper lobe lung  . Lupus ([place])    sle  . Peripheral neuropathy   . RA (rheumatoid arthritis) ([place])    ra  . Thrombocytopenia, unspecified ([place]) 09/04/2013  . Tobacco use     Patient Active Problem List   Diagnosis Date Noted  . Symptomatic anemia 11-11-17  . 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 Palliative care encounter   . Encounter for hospice care discussion   . Hypocalcemia   . Acute blood loss anemia   .  Abnormal LFTs   . Anemia 08/18/2017  . Weakness   . MDS (myelodysplastic syndrome) ([place]) 07/31/2017  . Iron deficiency anemia due to chronic blood loss 06/21/2017  . Primary malignant neoplasm of left upper lobe of lung ([place]) 08/11/2015  . Candida esophagitis ([place]) 07/01/2015  . Rectal bleeding 04/30/2015  . Colitis, acute 04/30/2015  . Viral gastroenteritis 04/01/2015  . Fracture of 5th metatarsal   . Fracture of 4th metatarsal   . Fracture of 3rd metatarsal   . Fracture of 2nd metatarsal   . Sepsis ([place]) 03/31/2015  . Thrombocytopenia ([place]) 03/31/2015  . AKI (acute kidney injury) ([place]) 03/31/2015  . Upper GI bleed   . Leukocytosis   . Malignant neoplasm of right main bronchus ([place]) 02/12/2015  . Thrombocytopenia, unspecified ([place]) 09/04/2013  . Normocytic anemia 09/04/2013  . Diabetes mellitus, type 2 ([place]) 09/03/2013  . Essential hypertension 09/03/2013  . Tobacco dependence 09/03/2013  . Shoulder pain 03/16/2011  . Stiffness of joint, not elsewhere classified,  shoulder region 03/16/2011  . Muscle weakness (generalized) 03/16/2011  . Rheumatoid arthritis ([place]) 08/16/2010    Past Surgical History:  Procedure Laterality Date  . ABDOMINAL HYSTERECTOMY    . APPENDECTOMY    . CATARACT EXTRACTION W/ INTRAOCULAR LENS IMPLANT  2010   right eye  . CESAREAN SECTION  x3  . COLONOSCOPY N/A 05/05/2015   Procedure: COLONOSCOPY;  Surgeon: Danie Binder, MD;  Location: [facility] END[facility] SUITE;  Service: Endoscopy;  Laterality: N/A;  . KNEE ARTHROSCOPY  yrs ago   left knee  . left arm surgery     multiple surgeries on  . OOPHORECTOMY    . SHOULDER HEMI-ARTHROPLASTY  01/27/2011   Procedure: SHOULDER HEMI-ARTHROPLASTY;  Surgeon: Nita Sells, MD;  Location: [facility] ORS;  Service: Orthopedics;  Laterality: Right;  interscaline right shoulder  . TRIGGER FINGER RELEASE  yrs ago   right hand     OB History   None      Home Medications    Prior to Admission medications   Medication  Sig Start Date End Date Taking? Authorizing Provider  acetaminophen (TYLENOL) 500 MG tablet Take 1 tablet (500 mg total) by mouth every 6 (six) hours as needed for mild pain, fever or headache. 08/29/17  Yes Roney Jaffe, MD  albuterol (PROVENTIL HFA;VENTOLIN HFA) 108 (90 BASE) MCG/ACT inhaler Inhale 2 puffs into the lungs every 6 (six) hours as needed for wheezing or shortness of breath. Please instruct in usage. 09/06/13  Yes Samuella Cota, MD  ALPRAZolam Duanne Moron) 0.25 MG tablet Take 0.25 mg by mouth 2 (two) times daily as needed for anxiety.   Yes [provider]  folic acid (FOLVITE) 1 MG tablet Take 1 mg by mouth daily.   Yes [provider]  lisinopril (PRINIVIL,ZESTRIL) 2.5 MG tablet Take 2.5 mg by mouth daily.  04/24/17  Yes [provider]  metoprolol tartrate (LOPRESSOR) 25 MG tablet Take 25 mg by mouth 2 (two) times daily. Reported on 07/28/2015   Yes [provider]  morphine (ROXANOL) 20 MG/ML concentrated solution Take 5 mg by mouth every 2 (two) hours as needed for severe pain or shortness of breath.   Yes [provider]  Multiple Vitamins-Minerals (PRESERVISION AREDS 2) CAPS Take 2 capsules by mouth 2 (two) times daily.   Yes [provider]  ondansetron (ZOFRAN) 8 MG tablet Take 1 tablet (8 mg total) by mouth every 8 (eight) hours as needed for nausea or vomiting. 08/07/17  Yes Higgs, Mathis Dad, MD  polyethylene glycol powder (GLYCOLAX/MIRALAX) powder Take 17 g by mouth daily as needed for mild constipation or moderate constipation.   Yes [provider]  promethazine (PHENERGAN) 25 MG tablet Take 25 mg by mouth every 4 (four) hours as needed for nausea or vomiting.   Yes [provider]  traMADol (ULTRAM) 50 MG tablet Take 50 mg by mouth 4 (four) times daily as needed for moderate pain.    Yes [provider]    Family History Family History  Family history unknown: Yes    Social History Social  History   Tobacco Use  . Smoking status: Current Every Day Smoker    Packs/day: 1.50    Years: 40.00    Pack years: 60.00    Types: Cigarettes  . Smokeless tobacco: Never Used  . Tobacco comment: 2 packs a day x 30 yrs  Substance Use Topics  . Alcohol use: No    Alcohol/week: 0.0 standard drinks  . Drug use: No     Allergies   Tape; Bupropion; and Lopid  [gemfibrozil]   Review of Systems Review of Systems  Constitutional: Negative for appetite change and fatigue.  HENT: Negative for congestion, ear discharge and sinus pressure.   Eyes: Negative for discharge.  Respiratory: Positive for shortness of breath. Negative for cough.   Cardiovascular: Negative for chest pain.  Gastrointestinal: Negative  for abdominal pain and diarrhea.  Genitourinary: Negative for frequency and hematuria.  Musculoskeletal: Negative for back pain.  Skin: Negative for rash.  Neurological: Negative for seizures and headaches.  Psychiatric/Behavioral: Negative for hallucinations.     Physical Exam Updated Vital Signs BP (!) 95/37   Pulse 62   Temp 98.1 F (36.7 C) (Oral)   Resp (!) 31   Ht 5\' 2"  (1.575 m)   Wt 66.2 kg   SpO2 (!) 87%   BMI 26.70 kg/m   Physical Exam  Constitutional: She appears well-developed.  HENT:  Head: Normocephalic.  Eyes: Conjunctivae and EOM are normal. No scleral icterus.  Neck: Neck supple. No thyromegaly present.  Cardiovascular: Normal rate. Exam reveals no gallop and no friction rub.  No murmur heard. Pulmonary/Chest: No stridor. She has wheezes. She has no rales. She exhibits no tenderness.  Tachypneic  Abdominal: She exhibits no distension. There is no tenderness. There is no rebound.  Musculoskeletal: Normal range of motion. She exhibits no edema.  Lymphadenopathy:    She has no cervical adenopathy.  Neurological: She exhibits normal muscle tone. Coordination normal.  Lethargic  Skin: No rash noted. No erythema.  Psychiatric: She has a normal mood  and affect.     ED Treatments / Results  Labs (all labs ordered are listed, but only abnormal results are displayed) Labs Reviewed  CBC WITH DIFFERENTIAL/PLATELET - Abnormal; Notable for the following components:      Result Value   RBC 1.19 (*)    Hemoglobin 3.9 (*)    HCT 14.2 (*)    MCV 119.3 (*)    MCHC 27.5 (*)    RDW 24.4 (*)    Platelets 13 (*)    nRBC 0.5 (*)    All other components within normal limits  COMPREHENSIVE METABOLIC PANEL - Abnormal; Notable for the following components:   Sodium 128 (*)    Potassium 5.9 (*)    Chloride 96 (*)    CO2 12 (*)    Glucose, Bld 266 (*)    BUN 38 (*)    Creatinine, Ser 1.83 (*)    Calcium 8.4 (*)    Albumin 3.0 (*)    Total Bilirubin 1.6 (*)    GFR calc non Af Amer 25 (*)    GFR calc Af Amer 30 (*)    Anion gap 20 (*)    All other components within normal limits  TROPONIN I - Abnormal; Notable for the following components:   Troponin I 0.05 (*)    All other components within normal limits  LACTIC ACID, PLASMA - Abnormal; Notable for the following components:   Lactic Acid, Venous 13.1 (*)    All other components within normal limits  BRAIN NATRIURETIC PEPTIDE - Abnormal; Notable for the following components:   B Natriuretic Peptide 702.0 (*)    All other components within normal limits  TYPE AND SCREEN    EKG EKG Interpretation  Date/Time:  11-25-2017 15:42:16 EDT Ventricular Rate:  105 PR Interval:    QRS Duration: 89 QT Interval:  345 QTC Calculation: 456 R Axis:   -47 Text Interpretation:  Sinus tachycardia with irregular rate Probable left atrial enlargement Left axis deviation Anteroseptal infarct, age indeterminate Repol abnrm, severe global ischemia (LM/MVD) Confirmed by Milton Ferguson [phone]) on 2017/11/25 4:30:58 PM   Radiology Dg Chest Portable 1 View  Result Date: 11-25-2017 CLINICAL DATA:  Patient with shortness of breath. EXAM: PORTABLE CHEST 1 VIEW COMPARISON:  Chest  radiograph  08/20/2017 FINDINGS: Patient is rotated to the right. Monitoring leads overlie the patient. Stable prominent cardiac and mediastinal contours. Aortic atherosclerosis. Diffuse bilateral interstitial pulmonary opacities. Small bilateral pleural effusions, right-greater-than-left. IMPRESSION: Increased interstitial opacities favored to represent pulmonary edema. Small bilateral pleural effusions. Electronically Signed   By: Lovey Newcomer M.D.   On: 2017-11-09 16:48    Procedures Procedures (including critical care time)  Medications Ordered in ED Medications  ipratropium-albuterol (DUONEB) 0.5-2.5 (3) MG/3ML nebulizer solution 3 mL (3 mLs Nebulization Not Given November 09, 2017 1619)  albuterol (PROVENTIL) (2.5 MG/3ML) 0.083% nebulizer solution 2.5 mg (2.5 mg Nebulization Not Given 2017/11/09 1618)  ondansetron (ZOFRAN) injection 4 mg (4 mg Intravenous Given 11-09-17 1555)  metoCLOPramide (REGLAN) injection 10 mg (10 mg Intravenous Given 11-09-17 1621)  HYDROmorphone (DILAUDID) injection 0.5 mg (0.5 mg Intravenous Given 11-09-17 1757)  furosemide (LASIX) injection 20 mg (20 mg Intravenous Given 2017/11/09 1757)     Initial Impression / Assessment and Plan / ED Course  I have reviewed the triage vital signs and the nursing notes.  Pertinent labs & imaging results that were available during my care of the patient were reviewed by me and considered in my medical decision making (see chart for details).    CRITICAL CARE Performed by: Milton Ferguson Total critical care time: 45 minutes Critical care time was exclusive of separately billable procedures and treating other patients. Critical care was necessary to treat or prevent imminent or life-threatening deterioration. Critical care was time spent personally by me on the following activities: development of treatment plan with patient and/or surrogate as well as nursing, discussions with consultants, evaluation of patient's response to treatment, examination  of patient, obtaining history from patient or surrogate, ordering and performing treatments and interventions, ordering and review of laboratory studies, ordering and review of radiographic studies, pulse oximetry and re-evaluation of patient's condition. 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  She is a DNR DNI and still agrees with not getting transfused.  She will be just comfort care. Final Clinical Impressions(s) / ED Diagnoses   Final diagnoses:  SOB (shortness of breath)    ED Discharge Orders    None       Milton Ferguson, MD November 09, 2017 1824

## 2017-11-17 NOTE — H&P (Signed)
History and Physical  [name] QPR:[id number] DOB: 08/31/1940 DOA: 10/31/2017  Referring physician: Dr [name], ED physician PCP: Kathy School, MD  Outpatient Specialists:   Patient Coming From: home  Chief Complaint: weakness, sent by hospice nurse  HPI: [name] is a 77 y.o. female with a history of DM2, HTN, Lung CA, chronic anemia status post multiple transfusions.  Patient has been.  She was seen by her hospice nurse today, who then respiratory distress.  Family requested that patient go to hospice home hospice home as well as here.  Initially, the family wanted a work-up which revealed 3.  Patient was being crossmatched for blood transfusion when the family patient decided not to proceed and to just do comfort care.  Hospice was contacted but they were unable to accommodate the patient and we were asked to admit the patient for hospice care.  Review of Systems:  Patient having mild pain and nausea.  Otherwise, no complaints.    Past Medical History:  Diagnosis Date  . Diabetes mellitus    x 20 yrs  . DNR (do not resuscitate) 08/2017  . Fibromyalgia   . Fibromyalgia   . Hospice care 08/2017  . Lung cancer ([place]) 01/28/2015   left upper lobe lung /right upper lobe lung  . Lupus ([place])    sle  . Peripheral neuropathy   . RA (rheumatoid arthritis) ([place])    ra  . Thrombocytopenia, unspecified ([place]) 09/04/2013  . Tobacco use    Past Surgical History:  Procedure Laterality Date  . ABDOMINAL HYSTERECTOMY    . APPENDECTOMY    . CATARACT EXTRACTION W/ INTRAOCULAR LENS IMPLANT  2010   right eye  . CESAREAN SECTION  x3  . COLONOSCOPY N/A 05/05/2015   Procedure: COLONOSCOPY;  Surgeon: Danie Binder, MD;  Location: [facility];  Service: Endoscopy;  Laterality: N/A;  . KNEE ARTHROSCOPY  yrs ago   left knee  . left arm surgery     multiple surgeries on  . OOPHORECTOMY    . SHOULDER HEMI-ARTHROPLASTY  01/27/2011   Procedure: SHOULDER HEMI-ARTHROPLASTY;   Surgeon: Nita Sells, MD;  Location: [facility];  Service: Orthopedics;  Laterality: Right;  interscaline right shoulder  . TRIGGER FINGER RELEASE  yrs ago   right hand   Social History:  reports that she has been smoking cigarettes. She has a 60.00 pack-year smoking history. She has never used smokeless tobacco. She reports that she does not drink alcohol or use drugs. Patient lives at home  Allergies  Allergen Reactions  . Tape   . Bupropion Nausea Only  . Lopid  [Gemfibrozil] Nausea Only    Family History  Family history unknown: Yes      Prior to Admission medications   Medication Sig Start Date End Date Taking? Authorizing Provider  acetaminophen (TYLENOL) 500 MG tablet Take 1 tablet (500 mg total) by mouth every 6 (six) hours as needed for mild pain, fever or headache. 08/29/17  Yes Roney Jaffe, MD  albuterol (PROVENTIL HFA;VENTOLIN HFA) 108 (90 BASE) MCG/ACT inhaler Inhale 2 puffs into the lungs every 6 (six) hours as needed for wheezing or shortness of breath. Please instruct in usage. 09/06/13  Yes Samuella Cota, MD  ALPRAZolam Duanne Moron) 0.25 MG tablet Take 0.25 mg by mouth 2 (two) times daily as needed for anxiety.   Yes [provider]  folic acid (FOLVITE) 1 MG tablet Take 1 mg by mouth daily.   Yes [provider]  lisinopril (PRINIVIL,ZESTRIL) 2.5 MG tablet Take 2.5 mg by mouth daily.  04/24/17  Yes [provider]  metoprolol tartrate (LOPRESSOR) 25 MG tablet Take 25 mg by mouth 2 (two) times daily. Reported on 07/28/2015   Yes [provider]  morphine (ROXANOL) 20 MG/ML concentrated solution Take 5 mg by mouth every 2 (two) hours as needed for severe pain or shortness of breath.   Yes [provider]  Multiple Vitamins-Minerals (PRESERVISION AREDS 2) CAPS Take 2 capsules by mouth 2 (two) times daily.   Yes [provider]  ondansetron (ZOFRAN) 8 MG tablet Take 1 tablet (8 mg total) by mouth every 8 (eight)  hours as needed for nausea or vomiting. 08/07/17  Yes Higgs, Mathis Dad, MD  polyethylene glycol powder (GLYCOLAX/MIRALAX) powder Take 17 g by mouth daily as needed for mild constipation or moderate constipation.   Yes [provider]  promethazine (PHENERGAN) 25 MG tablet Take 25 mg by mouth every 4 (four) hours as needed for nausea or vomiting.   Yes [provider]  traMADol (ULTRAM) 50 MG tablet Take 50 mg by mouth 4 (four) times daily as needed for moderate pain.    Yes [provider]    Physical Exam: BP (!) 95/37   Pulse 62   Temp 98.1 F (36.7 C) (Oral)   Resp (!) 31   Ht 5\' 2"  (1.575 m)   Wt 66.2 kg   SpO2 (!) 87%   BMI 26.70 kg/m   . General: Elderly Caucasian female. Awake and alert and oriented x3. No acute cardiopulmonary distress.  She is quite pale . HEENT: Normocephalic atraumatic.  Right and left ears normal in appearance.  Pupils equal, round, reactive to light. Extraocular muscles are intact. Sclerae anicteric and noninjected.  Pale mucosal membranes . Neck: Neck supple without lymphadenopathy. No carotid bruits. No masses palpated.  . Cardiovascular: Regular pulse . Respiratory: Good respiratory effort.  No accessory muscle use. . Abdomen: Not examined . Skin: Not examined . Musculoskeletal: Not examined . Psychiatric: Intact judgment and insight. Pleasant and cooperative. . Neurologic: No focal neurological deficits. Strength is 5/5 and symmetric in upper and lower extremities.  Cranial nerves II through XII are grossly intact.           Labs on Admission: I have personally reviewed following labs and imaging studies  CBC: Recent Labs  Lab November 24, 2017 1601  WBC 8.3  NEUTROABS 6.8  HGB 3.9*  HCT 14.2*  MCV 119.3*  PLT 13*   Basic Metabolic Panel: Recent Labs  Lab 11/24/17 1601  NA 128*  K 5.9*  CL 96*  CO2 12*  GLUCOSE 266*  BUN 38*  CREATININE 1.83*  CALCIUM 8.4*   GFR: Estimated Creatinine Clearance: 23 mL/min (A) (by  C-G formula based on SCr of 1.83 mg/dL (H)). Liver Function Tests: Recent Labs  Lab 2017-11-24 1601  AST 31  ALT 25  ALKPHOS 62  BILITOT 1.6*  PROT 7.0  ALBUMIN 3.0*   No results for input(s): LIPASE, AMYLASE in the last 168 hours. No results for input(s): AMMONIA in the last 168 hours. Coagulation Profile: No results for input(s): INR, PROTIME in the last 168 hours. Cardiac Enzymes: Recent Labs  Lab 24-Nov-2017 1601  TROPONINI 0.05*   BNP (last 3 results) No results for input(s): PROBNP in the last 8760 hours. HbA1C: No results for input(s): HGBA1C in the last 72 hours. CBG: No results for input(s): GLUCAP in the last 168 hours. Lipid Profile: No results for  input(s): CHOL, HDL, LDLCALC, TRIG, CHOLHDL, LDLDIRECT in the last 72 hours. Thyroid Function Tests: No results for input(s): TSH, T4TOTAL, FREET4, T3FREE, THYROIDAB in the last 72 hours. Anemia Panel: No results for input(s): VITAMINB12, FOLATE, FERRITIN, TIBC, IRON, RETICCTPCT in the last 72 hours. Urine analysis:    Component Value Date/Time   COLORURINE YELLOW 08/18/2017 2030   APPEARANCEUR HAZY (A) 08/18/2017 2030   LABSPEC 1.014 08/18/2017 2030   [place] 5.0 08/18/2017 2030   GLUCOSEU NEGATIVE 08/18/2017 2030   [place] NEGATIVE 08/18/2017 2030   [place] NEGATIVE 08/18/2017 2030   [place] 08/18/2017 2030   PROTEINUR NEGATIVE 08/18/2017 2030   UROBILINOGEN 0.2 06/14/2011 1605   NITRITE NEGATIVE 08/18/2017 2030   LEUKOCYTESUR MODERATE (A) 08/18/2017 2030   Sepsis Labs: @LABRCNTIP (procalcitonin:4,lacticidven:4) )No results found for this or any previous visit (from the past 240 hour(s)).   Radiological Exams on Admission: Dg Chest Portable 1 View  Result Date: Nov 14, 2017 CLINICAL DATA:  Patient with shortness of breath. EXAM: PORTABLE CHEST 1 VIEW COMPARISON:  Chest radiograph 08/20/2017 FINDINGS: Patient is rotated to the right. Monitoring leads overlie the patient. Stable prominent cardiac  and mediastinal contours. Aortic atherosclerosis. Diffuse bilateral interstitial pulmonary opacities. Small bilateral pleural effusions, right-greater-than-left. IMPRESSION: Increased interstitial opacities favored to represent pulmonary edema. Small bilateral pleural effusions. Electronically Signed   By: Lovey Newcomer M.D.   On: 11/14/2017 16:48     Assessment/Plan: Active Problems:   Malignant neoplasm of right main bronchus (HCC)   Symptomatic anemia    This patient was discussed with the ED physician, including pertinent vitals, physical exam findings, labs, and imaging.  We also discussed care given by the ED provider.  1. Malignant neoplasm of right main bronchus 2. Symptomatic anemia a. Admit to hospice b. Morphine for pain and air hunger c. Ativan for anxiety d. Oxygen therapy e. Zofran f. Other comfort measures as indicated   Family Communication: Multiple family members present CODE STATUS: DNR   Truett Mainland, DO [facility] Pager [phone]  If 7PM-7AM, please contact night-coverage [email or web address] Password [email or web address]

## 2017-11-17 NOTE — ED Notes (Signed)
CRITICAL VALUE ALERT  Critical Value:  Lactic Acid 13.1  Date & Time Notied:  2017/11/10 1718  Provider Notified: Dr. Roderic Palau  Orders Received/Actions taken: EDP notified, no further orders given

## 2017-11-17 DEATH — deceased

## 2019-04-02 IMAGING — CT CT ABD-PELV W/O CM
2 of 4 series · 16 of 46 positions shown, 18 images · non-contrast
Comparison: 05/19/2015

CLINICAL DATA: Right lower quadrant pain.  Suspected appendicitis.

EXAM:
CT ABDOMEN AND PELVIS WITHOUT CONTRAST
TECHNIQUE: Multidetector CT imaging of the abdomen and pelvis was performed
following the standard protocol without IV contrast.

[Series 3: a/p w/o 5mm · axial · non-contrast · 0.83mm/px · z∈[-124,+256]mm · 13 of 88 slices shown, 15 images]
[im 6/88  soft-tissue]
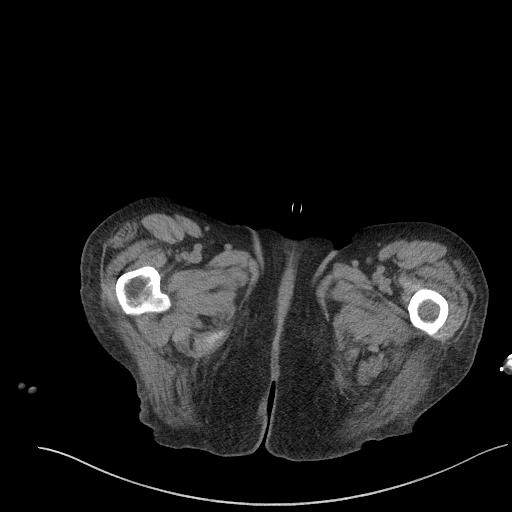
[im 6/88  bone]
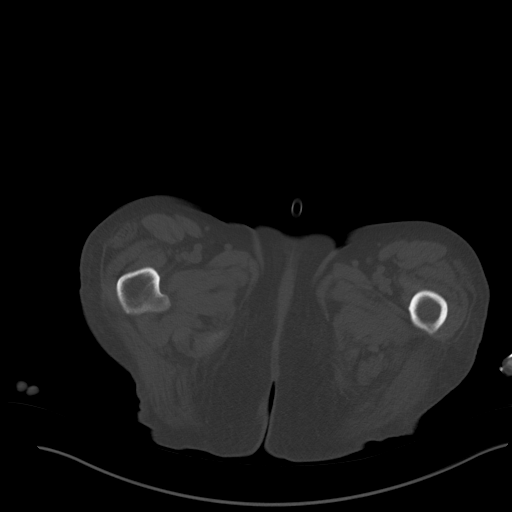
[im 12/88  soft-tissue]
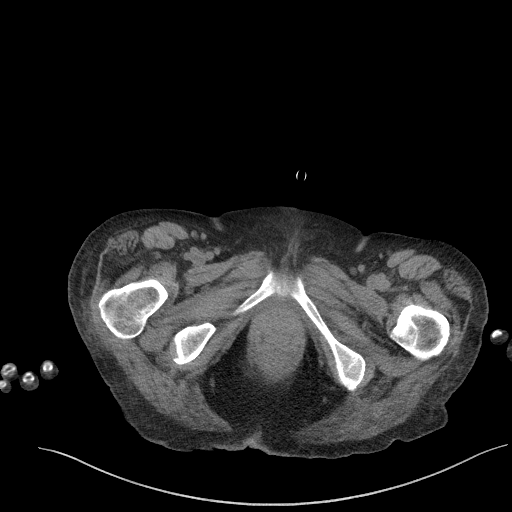
[im 18/88  soft-tissue]
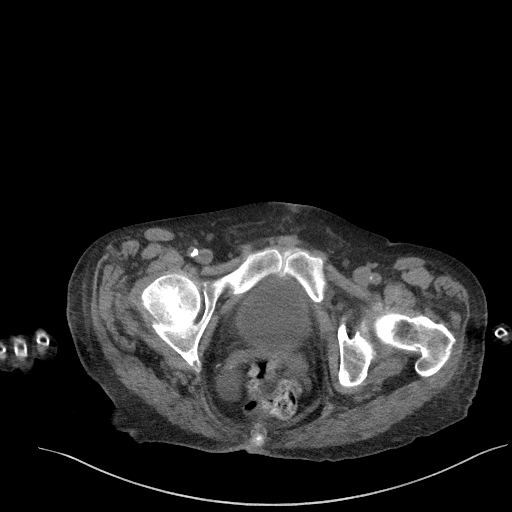
[im 24/88  soft-tissue]
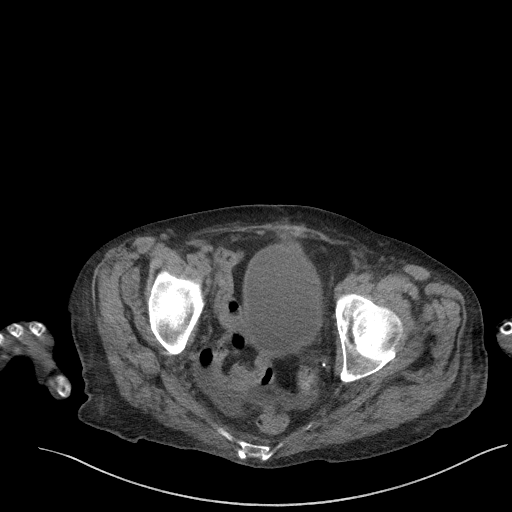
[im 30/88  soft-tissue]
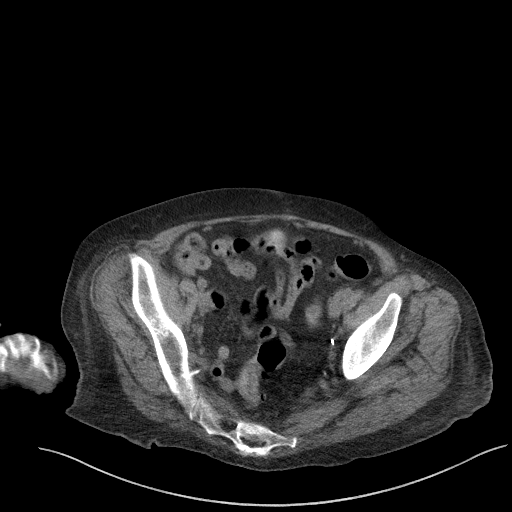
[im 35/88  soft-tissue]
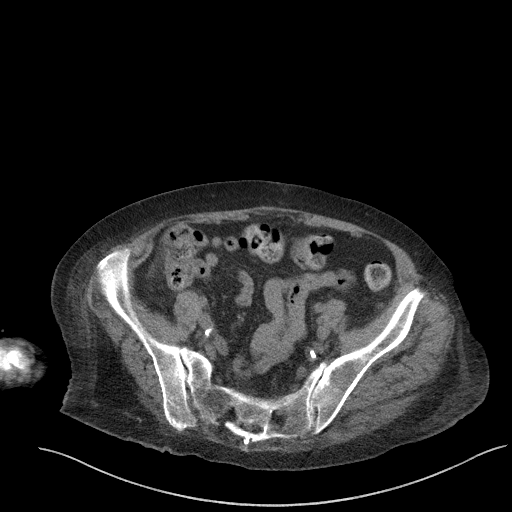
[im 47/88  soft-tissue]
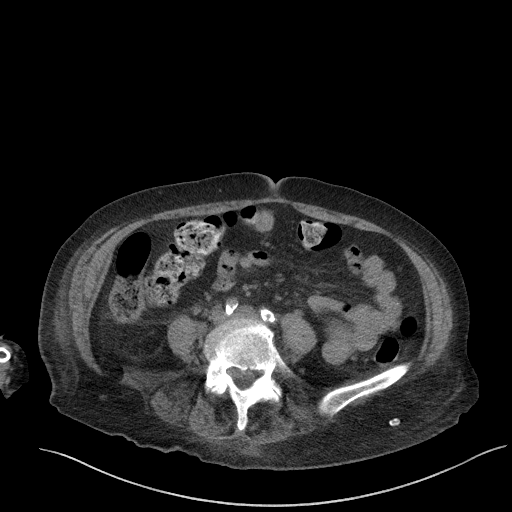
[im 53/88  soft-tissue]
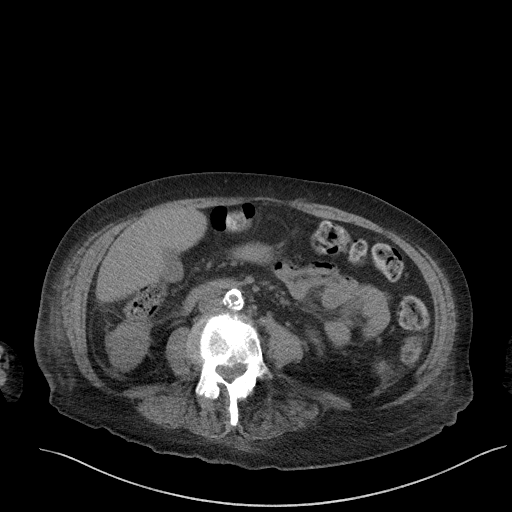
[im 59/88  soft-tissue]
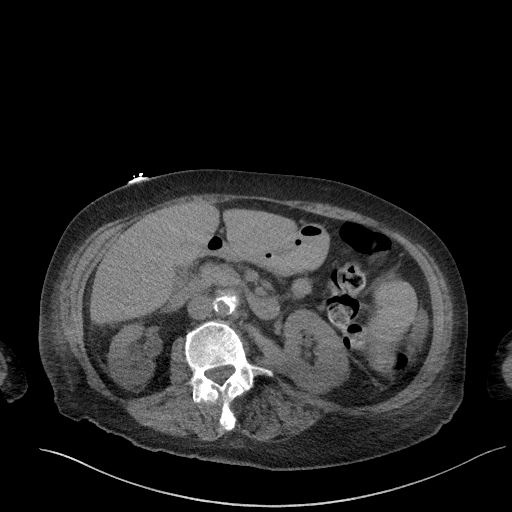
[im 59/88  bone]
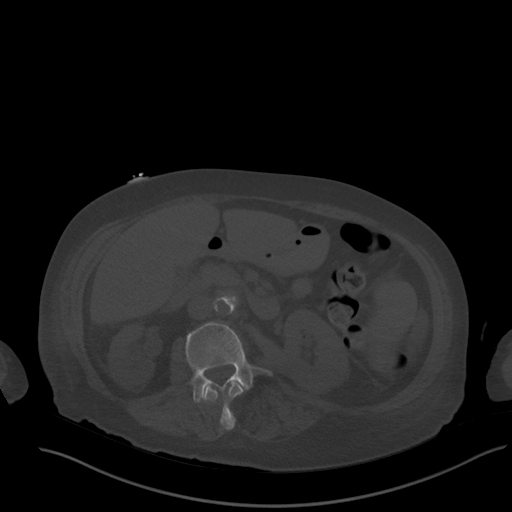
[im 64/88  soft-tissue]
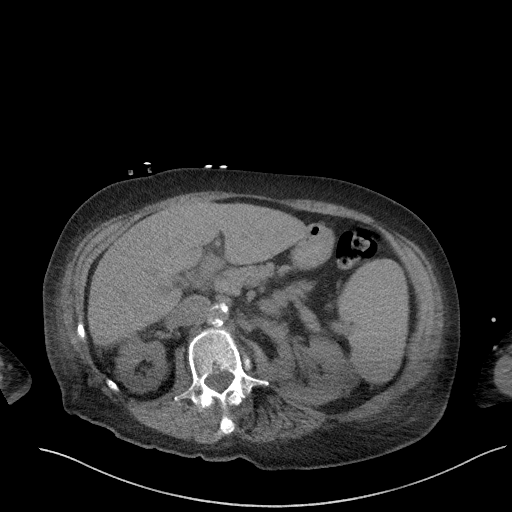
[im 70/88  soft-tissue]
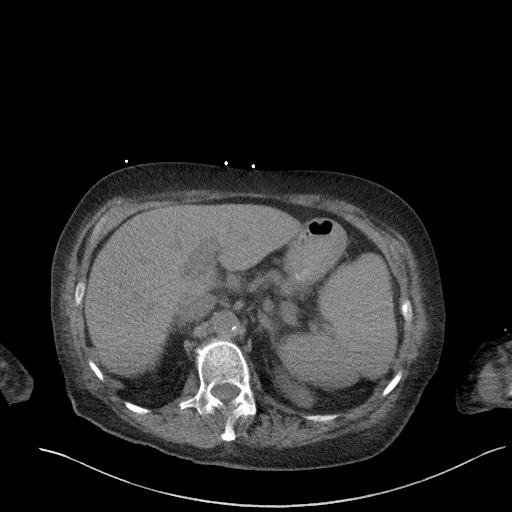
[im 76/88  soft-tissue]
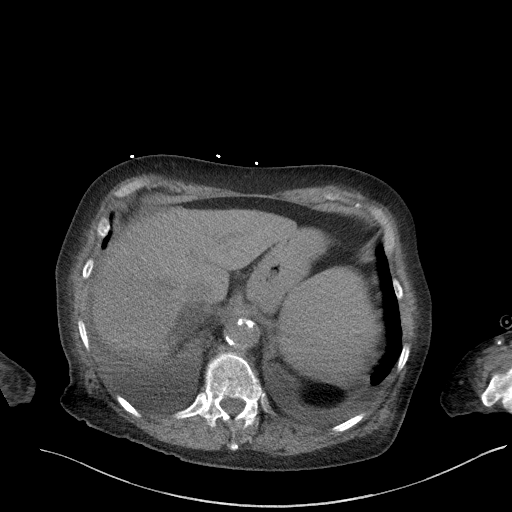
[im 82/88  soft-tissue]
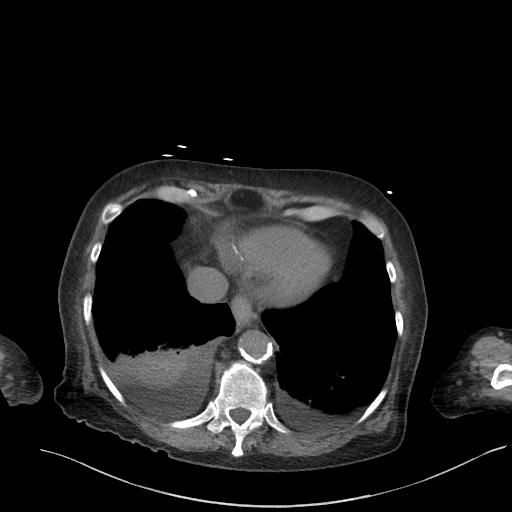

[Series 6: a/p w/o cor · coronal · non-contrast · 0.75mm/px · 3 of 151 slices shown]
[im 51/151  soft-tissue]
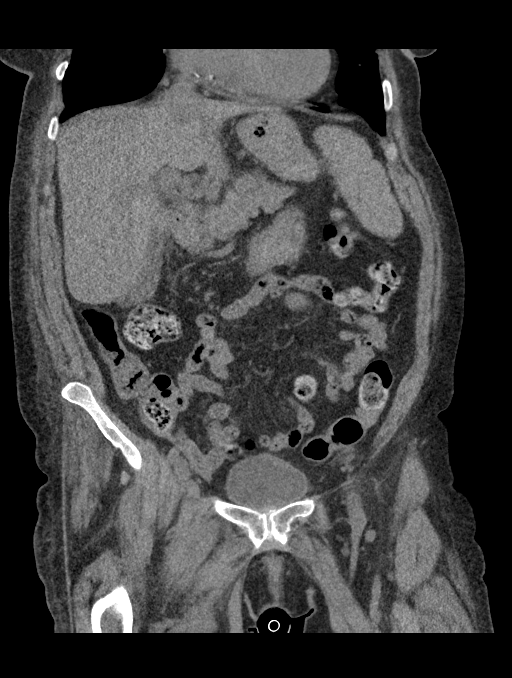
[im 67/151  soft-tissue]
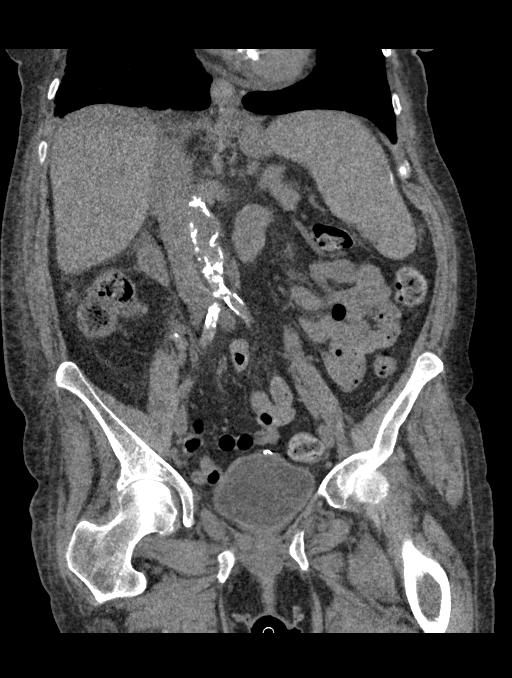
[im 84/151  soft-tissue]
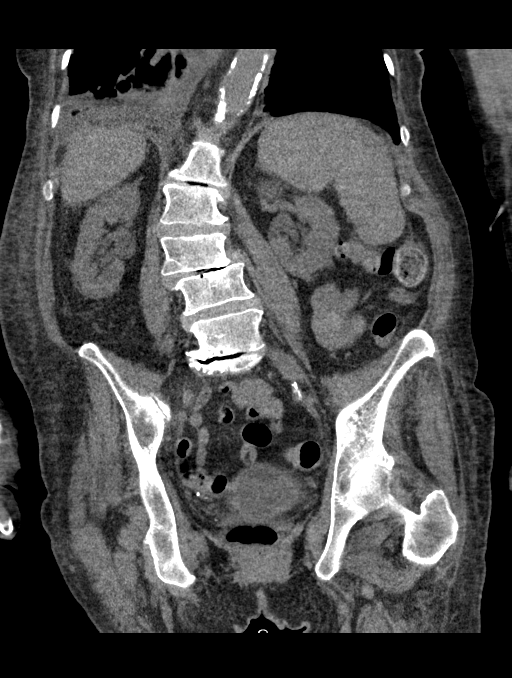

[16 of 46 positions shown; findings below may reference images not displayed]

FINDINGS: Lower chest: New small bilateral pleural effusions and bibasilar
atelectasis, right side greater than left.

Hepatobiliary: No mass visualized on this unenhanced exam.
Gallbladder is unremarkable.

Pancreas: No mass or inflammatory process visualized on this
unenhanced exam.

Spleen: Mild splenomegaly measuring approximately 13 cm in length,
mildly increased since prior study.

Adrenals/Urinary tract: No evidence of urolithiasis or
hydronephrosis. Stable small fluid attenuation left renal cyst.
Unremarkable unopacified urinary bladder.

Stomach/Bowel: No evidence of obstruction, inflammatory process, or
abnormal fluid collections. Although the appendix is not directly
visualized, no inflammatory process seen in region of the cecum or
elsewhere.

Vascular/Lymphatic: No pathologically enlarged lymph nodes
identified. No evidence of abdominal aortic aneurysm. Aortic
atherosclerosis.

Reproductive: Prior hysterectomy. No pelvic mass identified. Small
amount of free fluid is seen in the pelvic cul-de-sac, which is also
new since prior exam.

Other:  New diffuse body wall edema.

Musculoskeletal:  No suspicious bone lesions identified.
IMPRESSION: New mild splenomegaly.

New small bilateral pleural effusions and bibasilar atelectasis,
diffuse body wall edema, and small amount of pelvic free fluid,
consistent with 3rd spacing.
